# Patient Record
Sex: Male | Born: 1955
Health system: Southern US, Community
[De-identification: ages and names within clinical notes are randomized; demographics above are authoritative.]

## PROBLEM LIST (undated history)

## (undated) ENCOUNTER — Ambulatory Visit (HOSPITAL_COMMUNITY): Disposition: A | Discharge status: Other Acute Inpatient Hospital | Payer: 59

## (undated) DIAGNOSIS — Z973 Presence of spectacles and contact lenses: Secondary | ICD-10-CM

## (undated) DIAGNOSIS — E7800 Pure hypercholesterolemia, unspecified: Secondary | ICD-10-CM

## (undated) DIAGNOSIS — K5732 Diverticulitis of large intestine without perforation or abscess without bleeding: Secondary | ICD-10-CM

## (undated) DIAGNOSIS — L732 Hidradenitis suppurativa: Secondary | ICD-10-CM

## (undated) DIAGNOSIS — I4891 Unspecified atrial fibrillation: Secondary | ICD-10-CM

## (undated) DIAGNOSIS — E119 Type 2 diabetes mellitus without complications: Secondary | ICD-10-CM

## (undated) DIAGNOSIS — L723 Sebaceous cyst: Secondary | ICD-10-CM

## (undated) HISTORY — PX: ORCHIECTOMY: SHX2116

## (undated) HISTORY — DX: Type 2 diabetes mellitus without complications: E11.9

## (undated) HISTORY — PX: OTHER SURGICAL HISTORY: SHX169

---

## 1998-11-02 ENCOUNTER — Emergency Department (HOSPITAL_COMMUNITY): Admission: EM | Admit: 1998-11-02 | Discharge: 1998-11-02 | Payer: Self-pay

## 1998-12-01 ENCOUNTER — Emergency Department (HOSPITAL_COMMUNITY): Admission: EM | Admit: 1998-12-01 | Discharge: 1998-12-01 | Payer: Self-pay | Admitting: Emergency Medicine

## 1999-02-28 ENCOUNTER — Emergency Department (HOSPITAL_COMMUNITY): Admission: EM | Admit: 1999-02-28 | Discharge: 1999-02-28 | Payer: Self-pay | Admitting: Emergency Medicine

## 1999-05-23 ENCOUNTER — Emergency Department (HOSPITAL_COMMUNITY): Admission: EM | Admit: 1999-05-23 | Discharge: 1999-05-24 | Payer: Self-pay | Admitting: Internal Medicine

## 1999-07-17 ENCOUNTER — Emergency Department (HOSPITAL_COMMUNITY): Admission: EM | Admit: 1999-07-17 | Discharge: 1999-07-17 | Payer: Self-pay | Admitting: Emergency Medicine

## 2000-05-04 ENCOUNTER — Emergency Department (HOSPITAL_COMMUNITY): Admission: EM | Admit: 2000-05-04 | Discharge: 2000-05-04 | Payer: Self-pay | Admitting: *Deleted

## 2000-10-26 ENCOUNTER — Emergency Department (HOSPITAL_COMMUNITY): Admission: EM | Admit: 2000-10-26 | Discharge: 2000-10-27 | Payer: Self-pay | Admitting: Emergency Medicine

## 2001-06-12 ENCOUNTER — Emergency Department (HOSPITAL_COMMUNITY): Admission: EM | Admit: 2001-06-12 | Discharge: 2001-06-12 | Payer: Self-pay | Admitting: Emergency Medicine

## 2001-08-28 ENCOUNTER — Encounter (INDEPENDENT_AMBULATORY_CARE_PROVIDER_SITE_OTHER): Payer: Self-pay | Admitting: *Deleted

## 2001-08-28 ENCOUNTER — Encounter: Payer: Self-pay | Admitting: Emergency Medicine

## 2001-08-28 ENCOUNTER — Inpatient Hospital Stay (HOSPITAL_COMMUNITY): Admission: EM | Admit: 2001-08-28 | Discharge: 2001-09-06 | Payer: Self-pay | Admitting: Emergency Medicine

## 2001-09-02 ENCOUNTER — Encounter: Payer: Self-pay | Admitting: Urology

## 2002-07-14 ENCOUNTER — Emergency Department (HOSPITAL_COMMUNITY): Admission: EM | Admit: 2002-07-14 | Discharge: 2002-07-14 | Payer: Self-pay | Admitting: Emergency Medicine

## 2004-03-09 ENCOUNTER — Emergency Department (HOSPITAL_COMMUNITY): Admission: EM | Admit: 2004-03-09 | Discharge: 2004-03-09 | Payer: Self-pay | Admitting: Emergency Medicine

## 2006-08-15 ENCOUNTER — Emergency Department (HOSPITAL_COMMUNITY): Admission: EM | Admit: 2006-08-15 | Discharge: 2006-08-15 | Payer: Self-pay | Admitting: Family Medicine

## 2008-03-15 ENCOUNTER — Emergency Department (HOSPITAL_COMMUNITY): Admission: EM | Admit: 2008-03-15 | Discharge: 2008-03-15 | Payer: Self-pay | Admitting: Family Medicine

## 2008-03-17 ENCOUNTER — Inpatient Hospital Stay (HOSPITAL_COMMUNITY): Admission: EM | Admit: 2008-03-17 | Discharge: 2008-03-22 | Payer: Self-pay | Admitting: Emergency Medicine

## 2008-03-17 ENCOUNTER — Encounter (INDEPENDENT_AMBULATORY_CARE_PROVIDER_SITE_OTHER): Payer: Self-pay | Admitting: Orthopedic Surgery

## 2008-10-14 ENCOUNTER — Emergency Department (HOSPITAL_COMMUNITY): Admission: EM | Admit: 2008-10-14 | Discharge: 2008-10-14 | Payer: Self-pay | Admitting: Emergency Medicine

## 2010-11-29 NOTE — Op Note (Signed)
NAME:  Leroy Chen, Leroy Chen              ACCOUNT NO.:  [id number]   MEDICAL RECORD NO.:  [id number]          PATIENT TYPE:  INP   LOCATION:  5008                         FACILITY:  [facility]   PHYSICIAN:  Dionne Ano. Gramig III, M.D.DATE OF BIRTH:  1956/03/31   DATE OF PROCEDURE:  DATE OF DISCHARGE:                               OPERATIVE REPORT   PREOPERATIVE DIAGNOSIS:  Right hand deep abscess with purulent flexor  tenosynovitis.   POSTOPERATIVE DIAGNOSES:  Right hand deep abscess with purulent flexor  tenosynovitis with noted large inclusion cyst in the area of purulence  and notable flexor sheath infection.   SURGICAL PROCEDURE PERFORMED:  1. Incision and drainage deep abscess with evacuation of large amount      of purulence.  2. Irrigation and debridement of purulent flexor tendon sheath.  3. Removal of mass about the right hand, sent to pathology.  4. Ulnar digital nerve neurolysis with noted fraying and attenuation      of the epineurium.   SURGEON:  Dionne Ano. Amanda Pea, MD   ASSISTANT:  None.   COMPLICATIONS:  None.   DRAINS:  Two, one in the palm and one in the finger in the form of  iodoform gauze.   TOURNIQUET TIME:  Less than an hour.   INDICATIONS FOR THE PROCEDURE:  This patient is a pleasant male who has  had progressive infection in his hand, which did not respond to  conservative measures including doxycycline and visits to the Urgent  Care.  I have discussed with the patient the risks of surgery and should  note that he has a notable abscess, pain, purulent flexor tenosynovitic  symptoms and failure to respond.  With all issues in mind, he desired to  proceed the above-mentioned operative intervention fully understanding  and accepting the risks and benefits of the surgery.   OPERATION IN DETAIL:  The patient was administered anesthesia and taken  to the operative suite.  After smooth induction of general anesthesia,  time-out was called.  He was prepped and  draped in usual sterile fashion  and well secured.  Once under general anesthetic and sterile field, the  patient had incision made in the finger.  He had a large amount of de-  epithelialized tissue, which was removed.  This was performed without  difficulty.  Once this was removed, I then entered the area volarly  about the small finger, proximal phalanx region.  Dissection was carried  down and a large amount of thick purulent tissue was identified,  examined, and cultured, which was foul-smelling, thick abscess.  This  was removed without difficulty and following its removal and evacuation,  I then made a counterincision in the palm and debrided the skin and  subcutaneous tissue.  Once this was done, I then identified the flexor  sheath, which was encroached upon with the infection.  The sheath was  identified and I&D'ed rather aggressively and meticulously.  The sheath  had attenuation, but the flexor tendon apparatus was intact.  I  performed a through-and-through irrigation beginning in the palm and  exiting in the main wound.  Following this, the patient had a very  significant thick, glistening capsule, which looked like an inclusion  cyst.  This was circumferentially dissected and removed and sent for  pathology.  This was a deep mass excision.  Following this, I then  identified the ulnar digital nerve which I did multiple times during the  dissection and performed a neurolysis.  It had attenuation about the  epineurium region due to the infection and the mass itself that was  present, which was a likely inclusion-type cyst.  The patient had the  nerve very carefully protected.  It was not severed.  It was in  continuity but significantly bruised and injured in my opinion.  Following this, I then performed a very careful irrigation process with  greater than 3 L through the area.  The wound was then packed.  Tourniquet deflated.  Hemostasis was obtained with pressure and the   patient had no complicating features.  A sterile dressing was applied.  Excellent refill noted and once the dressing was applied, he was taken  to the recovery room where he was noted to be in stable condition.  We  will plan for elevation, pain management, antibiotics, await cultures,  and repeat washout in 36-48 hours.  I have discussed with him the  relevant do's and do not's, etc.  All questions have been encouraged and  answered.  I will give him a guarded prognosis.  He had significant  injuries to the ulnar digital nerve about the epineurium and thus I  would expect postop sensory disturbance.  This could even be permanent  given the look at the time of surgery.  In addition to this, we need to  work diligently on infection control.  I will start him on vancomycin  immediately after cultures and will also add [place].  It was a pleasure  to see him today.  We will work very hard to try to give him a  functioning hand despite a very significant infection.      Dionne Ano. Everlene Other, M.D.     Nash Mantis  D:  03/17/2008  T:  03/18/2008  Job:  [id number]

## 2010-11-29 NOTE — Op Note (Signed)
NAME:  Leroy Chen, Leroy Chen              ACCOUNT NO.:  [id number]   MEDICAL RECORD NO.:  [id number]          PATIENT TYPE:  INP   LOCATION:  5008                         FACILITY:  [facility]   PHYSICIAN:  Dionne Ano. Gramig, M.D.DATE OF BIRTH:  [birth year]   DATE OF PROCEDURE:  03/19/2008  DATE OF DISCHARGE:                               OPERATIVE REPORT   PREOPERATIVE DIAGNOSIS:  Right hand deep abscess with flexor  tenosynovitis, purulent in nature.   The patient is status post I and D, and presents for a repeat I and D  about the hand and small finger.   POSTOPERATIVE DIAGNOSIS:  Right hand deep abscess with flexor  tenosynovitis, purulent in nature.   PROCEDURE:  1. Incision and drainage, right hand skin, subcutaneous tissue,      tendon, and muscular tissue.  This was an excisional debridement.  2. Incision and drainage, small finger skin, subcutaneous tissue,      flexor tendon and posterior tendon sheath tissue.  This was an      excisional debridement.   SURGEON:  Dionne Ano. Amanda Pea, MD   ASSISTANT:  Karie Chimera, PA-C   COMPLICATIONS:  None.   ANESTHESIA:  General.   TOURNIQUET TIME:  Zero.   INDICATIONS FOR PROCEDURE:  This patient is a pleasant male, who has a  very deep significant abscess.  He had undergone an initial I and D, and  presents for repeat I and D and repair, reconstruction as necessary.  I  have counseled her with regard to benefits and risk of the surgery  including risk of infection, bleeding, anesthesia, damage to normal  structures, and failure of the surgery to accomplish its intended goals  of relieving symptoms and restoring function.  The patient understands  and desires to proceed.  All questions have been encouraged and answered  preoperatively.   OPERATIVE PROCEDURE:  The patient was seen by myself and anesthesia, he  was taken to the operative suite, where he underwent a smooth induction  of general anesthesia.  Following this, he was prepped  and draped in  sterile fashion with Betadine scrub and paint about the upper extremity.  Once this was complete, the patient then underwent I and D of skin,  subcutaneous tissue, muscle, tendon, and skin in the form of excisional  debridement about the hand.  This was a right hand I and D.  The wound  conditions were much improved.   Following this, he then underwent I and D of skin and subcutaneous  tissue, muscle, and tendinous tissue about the small finger in the form  of an excisional debridement.  The flexor tendon sheath looked well.  The flexors glided nicely, there were no significant reaccumulation,  although there was still significant erythema about the hand.  I looked  at the area very carefully and cautiously noted.  I did not notice any  obvious dystrophic reaction or vascular compromise.  The infection was,  of course, improved but certainly not a complete quiescence.   Following this, I placed a Penrose drain through-and-through and packed  the wound with a gauze followed  by sterile dressing.  The patient  tolerated this well and returned to recovery room.  I left a word for  the family and discussed with the patient all issues.   He will be continued on admit status for IV antibiotics.  We will begin  whirlpools tomorrow and wound care.  He is improving, but this was a  very significant abscess, deep infection and given the longstanding  duration of symptoms, I did feel that he has continued need for IV  antibiotics, need for intervention in the form of wound care, etc.  We  will await his final cultures, which are not back yet.   It has been a pleasure seeing him today.  All questions have been  encouraged and answered.      Dionne Ano. Amanda Pea, M.D.  Electronically Signed     WMG/MEDQ  D:  03/20/2008  T:  03/21/2008  Job:  [id number]

## 2010-12-02 NOTE — Discharge Summary (Signed)
[place]. [facility]  Patient:    CYPHER, PAULE Visit Number: [id number] MRN: [id number]          Service Type: MED Location: [phone] Attending Physician:  Ellwood Handler Dictated by:   [name], M.D. Admit Date:  08/28/2001 Discharge Date: 09/06/2001                             Discharge Summary  DATE OF BIRTH:  [birth year].  LMD:  Thora Lance, M.D.  UROLOGIST:  [name], M.D.  HISTORY OF PRESENT ILLNESS:  The indications, medications, allergies, tobacco, ETOH, past medical history, social history, physical examination and review of systems were all outlined in the admission note.  HOSPITAL COURSE:  The patient was admitted 08/28/01 undiagnosed diabetes, right testicular abscess refractory to outpatient oral antibiotic therapy.  The patient was ALLERGIC TO PENICILLIN.  Initial attempt at intravenous gentamicin and Unasyn changed to gentamicin and Cipro.  White blood cell count drawn shortly after admission 17.4.  Cultures were not obtained as the patient had been on p.o. Cipro for two days prior to admission.  He remained intermittently febrile on 08/28/01, 08/29/01, 08/30/01.  There were no clinical signs of Fourniers gangrene. Blood sugars were under improving control thanks to Dr. Kandyce Rud follow up.  By 09/01/01 the patient had no signs of sepsis or gangrene but pain and intermittent fevers persisted.  Lengthy discussion with the family 09/02/01 white blood cell count 17.8, hemoglobin 12.2, BUN 8, creatinine 1.0 felt we had made a good faith effort with intravenous antibiotics to resolve the situation.  The patients fever and pain continued and we proceeded with right scrotal orchiectomy 09/03/01.  Pathologic report on tissue submitted:  soft tissue and skin with acute and chronically inflamed granulation tissue consistent with abscess cavity, chronic epididymitis, and benign spermatocele.  No atypia was  identified.  By postoperative day #1 the patients temperature had fallen to 99 degrees F.  White blood cell count had fallen to 12,000.  The scrotal incision had been packed with Iodoform gauze which was changed on a b.i.d. basis.  The patient and family members were instructed on the technique of packing the wound.  By 09/06/01 temperature was 99 degrees F, white blood cell count had fallen to 12,000.  The patient was taking a regular diet with regular bowel habits, felt comfortable with p.o. Vicodin and p.o. Cipro.  PLAN:  Discharge home.  DISCHARGE DIAGNOSES: 1. Undiagnosed diabetes now under excellent control per Dr. Valentina Lucks. 2. Right testicular abscess, status post six days intravenous antibiotics    and then right orchiectomy.  DISCHARGE MEDICATIONS: 1. Cipro 2. Vicodin.  FOLLOW UP:  The patient has follow up with Dr. Valentina Lucks in two weeks and Dr. Wanda Plump in two weeks. Dictated by:   [name], M.D. Attending Physician:  Ellwood Handler DD:  09/06/01 TD:  09/07/01 Job: [id number]

## 2010-12-02 NOTE — H&P (Signed)
[place]. [facility]  Patient:    JESTEN, CAPPUCCIO Visit Number: [id number] MRN: [id number]          Service Type: MED Location: [phone] Attending Physician:  Ellwood Handler Dictated by:   [name], M.D. Proc. Date: 08/28/01 Admit Date:  08/28/2001   CC:         Thora Lance, M.D.   History and Physical  DATE OF BIRTH:  March 18, 1956  REFERRING PHYSICIAN:  Thora Lance, M.D.  HISTORY OF PRESENT ILLNESS:  The patient is a 55 year old black male with a strongly positive family history of diabetes, recent onset of right orchitis with suggestion by ultrasound of right testicular abscess.  These are the notable points of the recent history.  (1) Friday February 9 and Saturday February 10 the patient felt soreness in the right hemiscrotum, applied heat and maintained bed rest.  (2) Monday February 10 seen at the office by Dr. Tresa Endo, felt to have an epididymitis, treated with p.o. Cipro.  (3) Tuesday February 11 into the morning hours of Wednesday February 12, the patient noticed increasing pain, redness and onset of temperature 101 degrees F, presented to the emergency room.  MEDICATIONS ON ADMISSION:  The patient claims at present to not be taking any medications but had "tried a medicine for diabetes" several months ago.  ALLERGIES:  Denies.  TOBACCO:  Half a pack a day.  ETOH:  Occasional.  PAST MEDICAL HISTORY:  Excision of multiple sebaceous cysts from the posterior scalp on at least four or five occasions.  Pilonidal cyst x 2. Strongly positive family history of diabetes.  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  He is married for the second time.  41 year old wife.  Has two sons I assume from a previous marriage.  PHYSICAL EXAMINATION:  GENERAL:  The patient is an uncomfortable 55 year old black male.  VITAL SIGNS:  Temperature 101, pulse 131, respirations 20,  blood pressure 127/78.  HEENT:  Negative adenopathy, negative bruit.  Patient has multiple scars across the posterior scalp, consistent with excision of sebaceous cyst.  LUNGS:  The lungs were clear to auscultation and percussion.  HEART:  Regular rate and rhythm without murmur or gallop.  ABDOMEN:  Positive bowel sounds, soft, protuberant.  GU:  Bilaterally descended testes, normal penis and normal prostate.  Warm swollen, reddened right hemiscrotum.  No evidence of Fourniers gangrene at this time.  NEUROLOGIC:  Exam is grossly intact.  Ultrasound shows no evidence of torsion.  The patient has what appears to be a longstanding left-sided varicocele and a 3 cm abscess/phlegmon within the right testis.  PLAN:  Will admit for intravenous antibiotics.  Will follow clinical exam to rule out Fourniers gangrene or any undiagnosed diabetes. Dictated by:   [name], M.D. Attending Physician:  Ellwood Handler DD:  07/28/01 TD:  08/28/01 Job: [id number]

## 2010-12-02 NOTE — Consult Note (Signed)
[place]. [facility]  Patient:    Leroy, Chen Visit Number: [id number] MRN: [id number]          Service Type: MED Location: [phone] Attending Physician:  Cathren Laine Dictated by:   Thora Lance, M.D. Admit Date:  08/28/2001   CC:         [name], M.D.                          Consultation Report  HISTORY OF PRESENT ILLNESS:  This is a 55 year old black male admitted with a right testicular abscess.  Patient developed right testicular pain and swelling last week.  He was seen in our office on February 10 and diagnosed with epididymitis and started on ciprofloxacin.  Over the last 48 hours he has had progressive pain and swelling in the right testicle area and has developed fever.  An ultrasound today in the emergency room showed a right testicular abscess.  He had been admitted to the hospital by Dr. Wanda Plump of the urology service and I have been consulted for diabetic care.  The patient has a history of diabetes mellitus dating back to November 2000.  He was initially on Glucophage but that was stopped and he has been diet controlled since.  His hemoglobin A1C when last checked last spring was in the low 6 range.  He has not followed up since that time.  At home his blood sugars have been running in the high 100s.  He has had no known complications of his diabetes mellitus.  ALLERGIES:  PENICILLIN, unknown reaction as a child.  CURRENT MEDICATIONS:  Ciprofloxacin.  PAST MEDICAL HISTORY: 1. Diabetes mellitus type 2. 2. Pilonidal cyst. 3. Hydradenitis suppurativa of the scalp.  PAST SURGICAL HISTORY: 1. Pilonidal cyst removal April 1997. 2. Cyst drainage from the scalp 1990s. 3. Fracture left arm as a child.  FAMILY HISTORY:  Father died at age 67 with history of hypertension, heart disease, diabetes.  Mother died age 51 with history of CHF, diabetes, and hypertension.  Brother and sister are diabetic.  SOCIAL  HISTORY:  Divorced.  Two children.  He is Solicitor for [employer].  He smokes between one-third and one-half pack of cigarettes a day.  Drinks alcohol occasionally.  REVIEW OF SYSTEMS:  Otherwise negative.  PHYSICAL EXAMINATION  GENERAL:  He appears fairly comfortable lying supine.  VITAL SIGNS:  Temperature 101.0, heart rate 131, respirations 20, blood pressure 127/73.  HEENT:  Unremarkable.  LUNGS:  Clear.  HEART:  Regular rate and rhythm without murmur, rub, or gallop.  ABDOMEN:  Soft.  Normoactive bowel sounds.  No mass.  Nontender.  GENITOURINARY:  Warm, swollen right hemiscrotum with tenderness.  EXTREMITIES:  No edema.  LABORATORIES:  Sodium 133, potassium 3.6, chloride 103, bicarbonate 23, BUN 16, creatinine 1.1, glucose 176.  CBC:  WBC 14.7, hemoglobin 14.6, platelet count 314,000.  Urinalysis shows greater than 300 protein and greater than 500 glucose.  ASSESSMENT: 1. Right testicular abscess. 2. Diabetes mellitus, diet controlled.  RECOMMENDATION: 1. Management of testicular abscess with IV antibiotics and pain control as    per Dr. Wanda Plump. 2. Sliding scale Regular insulin. 3. Check hemoglobin A1C to assess recent control and possibly start oral agent    after that. Dictated by:   Thora Lance, M.D. Attending Physician:  Cathren Laine DD:  08/28/01 TD:  08/28/01 Job: [id number]

## 2010-12-02 NOTE — Discharge Summary (Signed)
NAME:  Leroy Chen, Leroy Chen              ACCOUNT NO.:  [id number]   MEDICAL RECORD NO.:  [id number]          PATIENT TYPE:  INP   LOCATION:  5008                         FACILITY:  [facility]   PHYSICIAN:  Dionne Ano. Gramig, M.D.DATE OF BIRTH:  February 25, 1956   DATE OF ADMISSION:  03/17/2008  DATE OF DISCHARGE:  03/22/2008                               DISCHARGE SUMMARY   ADMITTING DIAGNOSIS:  Deep abscess, right hand.   DISCHARGE DIAGNOSIS:  Deep abscess, right hand, improved.   SURGEON:  Dionne Ano. Gramig, MD   PROCEDURE:  Incision and drainage of deep abscess, incision and drainage  of flexor sheath and digital nerve neurolysis, and removal of mass about  the right hand.   BRIEF HISTORY OF PRESENT ILLNESS:  Leroy Chen is a 55 year old gentleman  who is left hand dominant and presented to the emergency room setting  for evaluation of his right hand.  He was noted to be seen and treated  for a cellulitic process/abscess initially at local urgent care and was  placed on doxycycline.  Unfortunately, he had continued pain, swelling,  and dysfunction and presented to the emergency room setting for takeover  care due to worsening clinical conditions.  Hand upper extremity  surgeon, Dominica Severin, was consulted.  On reviewing the patient, he  was noted to have what appear to be a puncture wound about the small  finger with associated soft tissue swelling, erythema, loss  flexion/extension secondary to pain, and ascending cellulitis.  Given  his examination, the decision was made to proceed with I&D and repair  reconstruction as necessary given his worsening condition.   Preoperative laboratory data was obtained, and EKG showed normal sinus  rhythm.  Hemogram showed white blood cell count of 10.8 and glucose was  116.  Decision was made to proceed with surgical intervention, and on  March 17, 2008, the patient underwent the above-mentioned procedure  without complications.  He was transferred to  PACU in stable condition.  He will be placed on orthopedic unit and was started on vancomycin and  Fortaz for antibiotic coverage until intraoperative cultures were  obtained for final results of sensitivity.  He was overall doing well.  Postoperative day #1, he was without complaints.  He felt better  overall.  Initial Gram-stain showed Gram-positive cocci.  He had  __________ about digits.   His heart was regular in rate and rhythm.  Lungs were clear to  auscultation.  Abdomen was soft, nontender, positive bowel sounds.  Given the significant infective process, plan for repeat washout was  made the following day.  He tolerated this well.  Of course, he was  noted to have significant amount of purulence about the abscess with  purulence into the flexor tendon sheath.  In addition, he underwent mass  removal about the right hand that was sent to Pathology which showed  benign epidermal inclusion cyst.  The patient did well postoperatively  with improvement overall of his wounds.  Therapeutic endeavors were  employed including hydrotherapy for wound care and packing.  He  continued his vancomycin and switched to Zosyn.  Final  cultures were  obtained.  They showed a moderate Staphylococcus aureus, a few Proteus  mirabilis.  Over the course of the next few days, the patient did quite  well, dressing changes were employed, as well as diligent wound care.  He had continued improvement in his wound and therapy was continually  consulted to teach the patient wet-to-dry dressing changes.  On  March 22, 2008, the patient was comfortable __________  well.  He was  alert and oriented.  Vital signs were stable.  He was afebrile.  His  wound showed significant improvement.  No significant purulence or  erythema was present, and range of motion was overall improved.  Therefore, decision was made to discharge him home in stable condition.   ASSESSMENT AND FINAL DIAGNOSIS:  Please see the discharge  diagnosis.   Condition on discharge is improved.   DIET:  Regular diet.   ACTIVITIES:  He will keep the hand clean and dry.  He will apply wet  daily dressing changes over the weekend and follow up with Dr. Amanda Pea on  Tuesday at 9:30 a.m.   Discharge medications included:  1. Percocet 5/325 one to two p.o. q.4-6 h. p.r.n.  2. Cipro 500 mg 1 p.o. b.i.d. for 10 days.  3. He will continue his doxycycline p.o. b.i.d. for 10 days.  4. Colace 100 mg p.o. b.i.d.  5. Vitamin C 500 mg 1 p.o. daily, over the counter.   All questions were encouraged and answered.      Karie Chimera, P.A.-C.      Dionne Ano. Amanda Pea, M.D.  Electronically Signed    BB/MEDQ  D:  05/27/2008  T:  05/27/2008  Job:  [id number]

## 2010-12-02 NOTE — Op Note (Signed)
[place]. [facility]  Patient:    Leroy Chen, Leroy Chen Visit Number: [id number] MRN: [id number]          Service Type: MED Location: [phone] Attending Physician:  Ellwood Handler Dictated by:   [name], M.D. Admit Date:  08/28/2001                             Operative Report  DATE OF BIRTH:  03-Sep-1955  LOCAL MEDICAL DOCTOR:  Kirby Funk, M.D.  UROLOGIST:  [name], M.D.  PREOPERATIVE DIAGNOSIS:  A 55 year old male diabetic, testis abscess, refractory to six days of intravenous antibiotic therapy.  Plans made for right scrotal orchiectomy.  POSTOPERATIVE DIAGNOSIS:  A 55 year old male diabetic, testis abscess, refractory to six days of intravenous antibiotic therapy.  Plans made for right scrotal orchiectomy.  PROCEDURE:  Right scrotal orchiectomy.  ANESTHESIA:  General.  DRAINS:  Iodoform gauze.  SPECIMENS:  Testis with attached spermatic cord.  DESCRIPTION OF PROCEDURE:  The patient was prepped and draped in the supine position after institution of an adequate level of general anesthesia.  Small transverse incision was made over the right external inguinal ring, carried down through the skin and subcutaneous tissue to identify the fibers of the external oblique as well as the external ring.  External oblique was divided in the direction of the fiber.  Cord was encircled and traced back to the internal ring.  It was then divided with Kelly clamps.  The proximal end of the cord showed no evidence of erythema or obvious clinical evidence of infection.  It was doubly ligated with 0 silk free ties.  The cord was then retracted medially.  Fascia of the external oblique was closed with interrupted sutures of 0 Vicryl.  A longitudinal incision was then made in the scrotum taking care to excise a small area of erythematous skin along the dependent portion of the scrotum that had been draining purulent  debris overnight.  Dissection with the Bovie was carried down through the skin and subcutaneous tissues to reveal abscess cavity both within the testis and adjacent to the testis.  Sharp and blunt dissection was used to free the testis and spermatic cord from its surrounding fibrous attachments.  Cord with attached testis was then sent free as a specimen.  Careful dissection freed the abscess cavity from the medial edge of the scrotal wall.  Care was taken to not injure the left testis or penile shaft.  Bleeding sites were then patiently coagulated using the Bovie.  Transverse incision that had been made over the external ring was partially closed with skin staples.  Then 3-0 nylon was used to close the upper part of the scrotal incision, about three mattress sutures were loosely placed in the skin.  Wound was the irrigated with about 2 L of warm normal saline.  The entire wound was then packed with 2-inch iodoform gauze and covered with an athletic supporter.  The patient was returned to recovery room in satisfactory condition. Dictated by:   [name], M.D. Attending Physician:  Ellwood Handler DD:  09/03/01 TD:  09/03/01 Job: [id number]

## 2011-04-19 LAB — BASIC METABOLIC PANEL
BUN: 8
CO2: 27
Calcium: 9.1
Chloride: 104
Chloride: 107
Creatinine, Ser: 0.9
GFR calc Af Amer: >60
GFR calc non Af Amer: >60
GFR calc non Af Amer: >60
Glucose, Bld: 116 — ABNORMAL HIGH
Glucose, Bld: 117 — ABNORMAL HIGH
Potassium: 3.7
Potassium: 3.9
Sodium: 139
Sodium: 140

## 2011-04-19 LAB — CBC
Hemoglobin: 13
MCHC: 32.9
MCV: 94.3
RBC: 4.2 — ABNORMAL LOW
WBC: 10.8 — ABNORMAL HIGH

## 2011-04-19 LAB — ANAEROBIC CULTURE

## 2011-04-19 LAB — VANCOMYCIN, TROUGH: Vancomycin Tr: 9.1

## 2011-04-19 LAB — CULTURE, ROUTINE-ABSCESS

## 2012-08-01 ENCOUNTER — Encounter (INDEPENDENT_AMBULATORY_CARE_PROVIDER_SITE_OTHER): Payer: Self-pay

## 2012-08-02 ENCOUNTER — Encounter (INDEPENDENT_AMBULATORY_CARE_PROVIDER_SITE_OTHER): Payer: Self-pay | Admitting: Surgery

## 2012-08-02 ENCOUNTER — Ambulatory Visit (INDEPENDENT_AMBULATORY_CARE_PROVIDER_SITE_OTHER): Payer: 59 | Admitting: Surgery

## 2012-08-02 VITALS — BP 110/68 | HR 84 | Temp 98.3°F | Resp 18 | Ht 73.0 in | Wt 257.6 lb

## 2012-08-02 DIAGNOSIS — L732 Hidradenitis suppurativa: Secondary | ICD-10-CM | POA: Insufficient documentation

## 2012-08-02 NOTE — Progress Notes (Signed)
Patient ID: Leroy Chen, male   DOB: 1955/09/11, 57 y.o.   MRN: [id number]  Chief Complaint  Patient presents with  . New Evaluation    eval abcess scalp    HPI Leroy Chen is a 57 y.o. male.  Patient sent at the request of Dr. Andi Devon for abscess on right scalp. Patient has been antibiotics for one week. Denies any significant pain, drainage and notices a lump just over his right ear.  History of  Hidradenitis involving posterior scalp status post wide excision with tissue expander and reconstruction by plastic surgeon. Denies fever chills. HPI  Past Medical History  Diagnosis Date  . Diabetes mellitus without complication     History reviewed. No pertinent past surgical history.  History reviewed. No pertinent family history.  Social History History  Substance Use Topics  . Smoking status: Current Every Day Smoker    Types: Cigarettes  . Smokeless tobacco: Never Used  . Alcohol Use: Yes     Comment: Social    Allergies  Allergen Reactions  . Penicillins Swelling    Current Outpatient Prescriptions  Medication Sig Dispense Refill  . aspirin 81 MG tablet Take 81 mg by mouth daily.      Marland Kitchen CIALIS 20 MG tablet       . doxycycline (DORYX) 100 MG DR capsule Take 100 mg by mouth 2 (two) times daily.      Marland Kitchen glimepiride (AMARYL) 4 MG tablet       . lisinopril (PRINIVIL,ZESTRIL) 20 MG tablet Take 20 mg by mouth daily.      . metFORMIN (GLUCOPHAGE) 500 MG tablet Take 500 mg by mouth 2 (two) times daily with a meal.      . Multiple Vitamin (MULTIVITAMIN) tablet Take 1 tablet by mouth daily.      . mupirocin ointment (BACTROBAN) 2 % Apply topically 3 (three) times daily.      Marland Kitchen PHISOHEX 3 % liquid       . rifampin (RIFADIN) 300 MG capsule Take 300 mg by mouth.      . sulfamethoxazole-trimethoprim (BACTRIM DS) 800-160 MG per tablet Take 1 tablet by mouth 2 (two) times daily.      . TESTIM 50 MG/5GM GEL         Review of Systems Review of Systems  Constitutional:  Negative for fever, chills and unexpected weight change.  HENT: Negative for hearing loss, congestion, sore throat, trouble swallowing and voice change.   Eyes: Negative for visual disturbance.  Respiratory: Negative for cough and wheezing.   Cardiovascular: Negative for chest pain, palpitations and leg swelling.  Gastrointestinal: Negative for nausea, vomiting, abdominal pain, diarrhea, constipation, blood in stool, abdominal distention, anal bleeding and rectal pain.  Genitourinary: Negative for hematuria and difficulty urinating.  Musculoskeletal: Negative for arthralgias.  Skin: Negative for rash and wound.  Neurological: Negative for seizures, syncope, weakness and headaches.  Hematological: Negative for adenopathy. Does not bruise/bleed easily.  Psychiatric/Behavioral: Negative for confusion.    Blood pressure 110/68, pulse 84, temperature 98.3 F (36.8 C), temperature source Temporal, resp. rate 18, height 6\' 1"  (1.854 m), weight 257 lb 9.6 oz (116.847 kg).  Physical Exam Physical Exam  Constitutional: He is oriented to person, place, and time. He appears well-developed and well-nourished.  HENT:  Head:    Eyes: EOM are normal. Pupils are equal, round, and reactive to light.  Neck: Normal range of motion. Neck supple.  Musculoskeletal: Normal range of motion.  Neurological: He is alert and  oriented to person, place, and time.  Psychiatric: He has a normal mood and affect. His behavior is normal. Judgment and thought content normal.       Assessment    Hidradenitis scalp without active infection    Plan    Patient has no active area infection currently. I discussed excision of this area but this would require plastic surgeon with extensive reconstruction and he has done it already and has no interest in pursuing that. At this point in time he is no acute issue to deal with and will followup as needed. Most of the  infection is resolved antibiotics.        Jaimarie Rapozo A. 08/02/2012, 9:47 AM

## 2012-08-02 NOTE — Patient Instructions (Signed)
Hidradenitis Suppurativa, Sweat Gland Abscess  Hidradenitis suppurativa is a long lasting (chronic), uncommon disease of the sweat glands. With this, boil-like lumps and scarring develop in the groin, some times under the arms (axillae), and under the breasts. It may also uncommonly occur behind the ears, in the crease of the buttocks, and around the genitals.   CAUSES   The cause is from a blocking of the sweat glands. They then become infected. It may cause drainage and odor. It is not contagious. So it cannot be given to someone else. It most often shows up in puberty (about 10 to 56 years of age). But it may happen much later. It is similar to acne which is a disease of the sweat glands. This condition is slightly more common in African-Americans and women.  SYMPTOMS    Hidradenitis usually starts as one or more red, tender, swellings in the groin or under the arms (axilla).   Over a period of hours to days the lesions get larger. They often open to the skin surface, draining clear to yellow-colored fluid.   The infected area heals with scarring.  DIAGNOSIS   Your caregiver makes this diagnosis by looking at you. Sometimes cultures (growing germs on plates in the lab) may be taken. This is to see what germ (bacterium) is causing the infection.   TREATMENT    Topical germ killing medicine applied to the skin (antibiotics) are the treatment of choice. Antibiotics taken by mouth (systemic) are sometimes needed when the condition is getting worse or is severe.   Avoid tight-fitting clothing which traps moisture in.   Dirt does not cause hidradenitis and it is not caused by poor hygiene.   Involved areas should be cleaned daily using an antibacterial soap. Some patients find that the liquid form of Lever 2000, applied to the involved areas as a lotion after bathing, can help reduce the odor related to this condition.   Sometimes surgery is needed to drain infected areas or remove scarred tissue. Removal of  large amounts of tissue is used only in severe cases.   Birth control pills may be helpful.   Oral retinoids (vitamin A derivatives) for 6 to 12 months which are effective for acne may also help this condition.   Weight loss will improve but not cure hidradenitis. It is made worse by being overweight. But the condition is not caused by being overweight.   This condition is more common in people who have had acne.   It may become worse under stress.  There is no medical cure for hidradenitis. It can be controlled, but not cured. The condition usually continues for years with periods of getting worse and getting better (remission).  Document Released: 02/15/2004 Document Revised: 09/25/2011 Document Reviewed: 03/02/2008  [employer] Patient Information 2013 [employer].

## 2012-08-03 ENCOUNTER — Emergency Department (HOSPITAL_COMMUNITY)
Admission: EM | Admit: 2012-08-03 | Discharge: 2012-08-03 | Disposition: A | Discharge status: Home / Self Care | Payer: 59 | Source: Home / Self Care | Attending: Emergency Medicine | Admitting: Emergency Medicine

## 2012-08-03 ENCOUNTER — Encounter (HOSPITAL_COMMUNITY): Payer: Self-pay | Admitting: Emergency Medicine

## 2012-08-03 DIAGNOSIS — L723 Sebaceous cyst: Secondary | ICD-10-CM

## 2012-08-03 MED ORDER — CLINDAMYCIN HCL 300 MG PO CAPS: 300.0000 mg | ORAL_CAPSULE | Freq: Four times a day (QID) | ORAL | Status: DC

## 2012-08-03 NOTE — ED Provider Notes (Signed)
Chief Complaint  Patient presents with  . Abscess    on right side of face above ear x 1 month    History of Present Illness:    Leroy Chen is a 57 year old male who has a 3 week history of a boil on his right temple. He's been to [facility] urgent care twice and then placed on various antibiotics including Septra and doxycycline without improvement. This has not been incised or drained. It's not draining spontaneously and he's had no fever or chills. He does have a history of recurring abscesses throughout his life and has a history of dissecting cellulitis of the scalp. He also has a history of hidradenitis suppurativa and MRSA.  Review of Systems:  Other than noted above, the patient denies any of the following symptoms: Systemic:  No fever, chills or sweats. Skin:  No rash or itching.  PMFSH:  Past medical history, family history, social history, meds, and allergies were reviewed.  No history of diabetes or prior history of abscesses or MRSA.  Physical Exam:   Vital signs:  BP 147/86  Pulse 87  Temp 98.2 F (36.8 C) (Oral)  Resp 17  SpO2 100% Skin:  There is a 3 cm raised, red, fluctuant mass in the right temple just at the hairline which was tender to palpation.  Skin exam was otherwise normal, except for extensive scarring of the scalp secondary to previous history of dissecting cellulitis of the scalp.  No rash. Ext:  Distal pulses were full, patient has full ROM of all joints.  Procedure:  Verbal informed consent was obtained.  The patient was informed of the risks and benefits of the procedure and understands and accepts.  Identity of the patient was verified verbally and by wristband.   The abscess area described above was prepped with Betadine and alcohol and anesthetized with 3 mL of 2% Xylocaine with epinephrine.  Using a #11 scalpel blade, a singe straight incision was made into the area of fluctulence, yielding a large amount of prurulent drainage.  Routine cultures  were obtained.  Blunt dissection was used to break up loculations, and a cystic structure was found which was removed in its entirety. The resulting wound cavity was packed with 1/4 inch Iodoform gauze.  A sterile pressure dressing was applied.  Assessment:  The encounter diagnosis was Sebaceous cyst.  Plan:   1.  The following meds were prescribed:   New Prescriptions   CLINDAMYCIN (CLEOCIN) 300 MG CAPSULE    Take 1 capsule (300 mg total) by mouth 4 (four) times daily.   2.  The patient was instructed in symptomatic care and handouts were given. 3.  The patient was instructed to leave the dressing in place and return again in 48 hours for packing removal.  [name], MD 08/03/12 [phone]

## 2012-08-03 NOTE — ED Notes (Signed)
Pt c/o abscess on right side of face above ear x 1 month. Was seen at [facility] and is currently taking meds but it seems to be getting worse instead of better, per pt.

## 2012-08-03 NOTE — ED Notes (Signed)
Pt state that he has a history of skin infections.

## 2012-08-06 LAB — CULTURE, ROUTINE-ABSCESS: Culture: NO GROWTH

## 2012-10-28 ENCOUNTER — Emergency Department (HOSPITAL_COMMUNITY)
Admission: EM | Admit: 2012-10-28 | Discharge: 2012-10-28 | Disposition: A | Discharge status: Home / Self Care | Payer: 59 | Source: Home / Self Care

## 2012-10-28 ENCOUNTER — Encounter (HOSPITAL_COMMUNITY): Payer: Self-pay | Admitting: Emergency Medicine

## 2012-10-28 DIAGNOSIS — M7918 Myalgia, other site: Secondary | ICD-10-CM

## 2012-10-28 DIAGNOSIS — M79609 Pain in unspecified limb: Secondary | ICD-10-CM

## 2012-10-28 DIAGNOSIS — IMO0001 Reserved for inherently not codable concepts without codable children: Secondary | ICD-10-CM

## 2012-10-28 DIAGNOSIS — M791 Myalgia, unspecified site: Secondary | ICD-10-CM

## 2012-10-28 DIAGNOSIS — R209 Unspecified disturbances of skin sensation: Secondary | ICD-10-CM

## 2012-10-28 NOTE — ED Provider Notes (Signed)
History     CSN: [id number]  Arrival date & time 10/28/12  1150   First MD Initiated Contact with Patient 10/28/12 1256      Chief Complaint  Patient presents with  . Arm Pain    (Consider location/radiation/quality/duration/timing/severity/associated sxs/prior treatment) HPI Comments: 57 year old male is complaining of numbness in the right upper extremity associated with pain in the right upper arm and trapezius muscle. The symptoms began approximately 2 weeks ago. He went to the [facility] urgent center where they took x-rays of the neck and shoulder. He was told these were negative for anything that may calls numbness and pain to the shoulder. He spends a lot of time on the computer. The muscles of the shoulder and arm  are primarily in contracted using on the keyboard. No history of trauma.   Past Medical History  Diagnosis Date  . Diabetes mellitus without complication     Past Surgical History  Procedure Laterality Date  . Hand sugery      No family history on file.  History  Substance Use Topics  . Smoking status: Current Every Day Smoker    Types: Cigarettes  . Smokeless tobacco: Never Used  . Alcohol Use: Yes     Comment: Social      Review of Systems  Constitutional: Negative.   Respiratory: Negative.   Gastrointestinal: Negative.   Genitourinary: Negative.   Musculoskeletal: Positive for myalgias. Negative for arthralgias.       As per HPI  Skin: Negative.   Neurological: Negative for dizziness, weakness and headaches.    Allergies  Penicillins  Home Medications   Current Outpatient Rx  Name  Route  Sig  Dispense  Refill  . aspirin 81 MG tablet   Oral   Take 81 mg by mouth daily.         Marland Kitchen CIALIS 20 MG tablet               . clindamycin (CLEOCIN) 300 MG capsule   Oral   Take 1 capsule (300 mg total) by mouth 4 (four) times daily.   40 capsule   0   . doxycycline (DORYX) 100 MG DR capsule   Oral   Take 100 mg by mouth 2 (two)  times daily.         Marland Kitchen glimepiride (AMARYL) 4 MG tablet               . lisinopril (PRINIVIL,ZESTRIL) 20 MG tablet   Oral   Take 20 mg by mouth daily.         . metFORMIN (GLUCOPHAGE) 500 MG tablet   Oral   Take 500 mg by mouth 2 (two) times daily with a meal.         . Multiple Vitamin (MULTIVITAMIN) tablet   Oral   Take 1 tablet by mouth daily.         . mupirocin ointment (BACTROBAN) 2 %   Topical   Apply topically 3 (three) times daily.         Marland Kitchen PHISOHEX 3 % liquid               . rifampin (RIFADIN) 300 MG capsule   Oral   Take 300 mg by mouth.         . sulfamethoxazole-trimethoprim (BACTRIM DS) 800-160 MG per tablet   Oral   Take 1 tablet by mouth 2 (two) times daily.         . TESTIM 50 MG/5GM  GEL                 BP 133/53  Pulse 86  Temp(Src) 98.3 F (36.8 C) (Oral)  Resp 28  SpO2 97%  Physical Exam  Nursing note and vitals reviewed. Constitutional: He is oriented to person, place, and time. He appears well-developed and well-nourished. No distress.  Eyes: EOM are normal.  Neck: Normal range of motion. Neck supple.  Pulmonary/Chest: Effort normal. No respiratory distress.  Musculoskeletal:  And this in the right trapezius musculature as well as the deltoid and part of the triceps. Primary involving the muscles of abduction. Is able to abduct the arm 180 produces moderate pain in the trapezius. Strength in the right upper extremities 5 over 5 distal neurovascular and motor sensory is completely intact. Positive Phalen's.  Neurological: He is alert and oriented to person, place, and time. He exhibits normal muscle tone.  Skin: Skin is warm and dry.  Psychiatric: He has a normal mood and affect.    ED Course  Procedures (including critical care time)  Labs Reviewed - No data to display No results found.   1. Paresthesia and pain of right extremity   2. Myalgia   3. Myofascial pain on right side       MDM  Limit use  of shoulder and neck muscles in regards to activities that make it worse. Relax these muscle as demonstrated. Heat locally, Ibuprofen on occasion. Massage, deep will help.  Adjust the arm and shoulder positions to minimize pain while using the keyboard. , Told to keep appointment with PCP or if worse may return        Hayden Rasmussen, NP 10/28/12 1335

## 2012-10-28 NOTE — ED Provider Notes (Signed)
Medical screening examination/treatment/procedure(s) were performed by non-physician practitioner and as supervising physician I was immediately available for consultation/collaboration.  Laura-Lee Villegas   Kenidee Cregan, MD 10/28/12 1358 

## 2012-10-28 NOTE — ED Notes (Signed)
Right arm tingling/numbness.  Reports 2 week history of issues with right arm.  Patient is left handed.  No known injury.  Patient reports pain as odd feeling-numbness/tingling .  Discomfort is varied in degree sensation is felt and when felt.

## 2012-11-25 ENCOUNTER — Encounter (HOSPITAL_COMMUNITY): Payer: Self-pay

## 2012-11-25 ENCOUNTER — Emergency Department (HOSPITAL_COMMUNITY)
Admission: EM | Admit: 2012-11-25 | Discharge: 2012-11-25 | Disposition: A | Discharge status: Home / Self Care | Payer: 59 | Attending: Emergency Medicine | Admitting: Emergency Medicine

## 2012-11-25 ENCOUNTER — Emergency Department (HOSPITAL_COMMUNITY): Payer: 59

## 2012-11-25 DIAGNOSIS — Z880 Allergy status to penicillin: Secondary | ICD-10-CM | POA: Insufficient documentation

## 2012-11-25 DIAGNOSIS — R0609 Other forms of dyspnea: Secondary | ICD-10-CM | POA: Insufficient documentation

## 2012-11-25 DIAGNOSIS — F17200 Nicotine dependence, unspecified, uncomplicated: Secondary | ICD-10-CM | POA: Insufficient documentation

## 2012-11-25 DIAGNOSIS — Z79899 Other long term (current) drug therapy: Secondary | ICD-10-CM | POA: Insufficient documentation

## 2012-11-25 DIAGNOSIS — R0989 Other specified symptoms and signs involving the circulatory and respiratory systems: Secondary | ICD-10-CM | POA: Insufficient documentation

## 2012-11-25 DIAGNOSIS — R079 Chest pain, unspecified: Secondary | ICD-10-CM | POA: Insufficient documentation

## 2012-11-25 DIAGNOSIS — Z8639 Personal history of other endocrine, nutritional and metabolic disease: Secondary | ICD-10-CM | POA: Insufficient documentation

## 2012-11-25 DIAGNOSIS — Z862 Personal history of diseases of the blood and blood-forming organs and certain disorders involving the immune mechanism: Secondary | ICD-10-CM | POA: Insufficient documentation

## 2012-11-25 DIAGNOSIS — Z7982 Long term (current) use of aspirin: Secondary | ICD-10-CM | POA: Insufficient documentation

## 2012-11-25 DIAGNOSIS — E119 Type 2 diabetes mellitus without complications: Secondary | ICD-10-CM | POA: Insufficient documentation

## 2012-11-25 HISTORY — DX: Pure hypercholesterolemia, unspecified: E78.00

## 2012-11-25 LAB — POCT I-STAT TROPONIN I: Troponin i, poc: 0 ng/mL (ref 0.00–0.08)

## 2012-11-25 LAB — POCT I-STAT, CHEM 8
HCT: 50 % (ref 39.0–52.0)
Hemoglobin: 17 g/dL (ref 13.0–17.0)
Sodium: 143 mEq/L (ref 135–145)
TCO2: 24 mmol/L (ref 0–100)

## 2012-11-25 LAB — CBC
MCH: 31.2 pg (ref 26.0–34.0)
MCHC: 33.7 g/dL (ref 30.0–36.0)
Platelets: 224 10*3/uL (ref 150–400)

## 2012-11-25 MED ORDER — GI COCKTAIL ~~LOC~~
30.0000 mL | Freq: Once | ORAL | Status: AC
  Administered 2012-11-25: 30 mL via ORAL
  Filled 2012-11-25: qty 30

## 2012-11-25 MED ORDER — FAMOTIDINE 20 MG PO TABS: 20.0000 mg | ORAL_TABLET | Freq: Two times a day (BID) | ORAL | Status: DC

## 2012-11-25 MED ORDER — ASPIRIN 81 MG PO CHEW
324.0000 mg | CHEWABLE_TABLET | Freq: Once | ORAL | Status: AC
  Administered 2012-11-25: 324 mg via ORAL
  Filled 2012-11-25: qty 4

## 2012-11-25 NOTE — ED Notes (Signed)
Pt complains of chest pain onset 3-4 days ago with hx of indegestion also. Reports diaphoresis at tnight and sob.

## 2012-11-25 NOTE — ED Provider Notes (Signed)
History     CSN: [id number]  Arrival date & time 11/25/12  0510   First MD Initiated Contact with Patient 11/25/12 0532      Chief Complaint  Patient presents with  . Chest Pain    (Consider location/radiation/quality/duration/timing/severity/associated sxs/prior treatment) HPI Hx per PT. Substernal chest pressure last few days constant unchanged. No known aggrevating or alleviating factors, has been trying OTC antiacid medications without relief. No SOB with these symptoms although reports some intermittent dyspnea at times. No cough, no F/C, no heartburn. No trauma. No rash. No recent illness. No known h/o CAD, has never had a stress test.  Pain is MOD in severity and not radiating Past Medical History  Diagnosis Date  . Diabetes mellitus without complication   . High cholesterol     Past Surgical History  Procedure Laterality Date  . Hand sugery      History reviewed. No pertinent family history.  History  Substance Use Topics  . Smoking status: Current Every Day Smoker    Types: Cigarettes  . Smokeless tobacco: Never Used  . Alcohol Use: Yes     Comment: Social      Review of Systems  Constitutional: Negative for fever and chills.  HENT: Negative for neck pain and neck stiffness.   Eyes: Negative for pain.  Respiratory: Negative for cough.   Cardiovascular: Positive for chest pain.  Gastrointestinal: Negative for abdominal pain.  Genitourinary: Negative for dysuria.  Musculoskeletal: Negative for back pain.  Skin: Negative for rash.  Neurological: Negative for headaches.  All other systems reviewed and are negative.    Allergies  Penicillins  Home Medications   Current Outpatient Rx  Name  Route  Sig  Dispense  Refill  . aspirin 81 MG tablet   Oral   Take 81 mg by mouth daily.         Marland Kitchen CIALIS 20 MG tablet               . clindamycin (CLEOCIN) 300 MG capsule   Oral   Take 1 capsule (300 mg total) by mouth 4 (four) times daily.   40  capsule   0   . doxycycline (DORYX) 100 MG DR capsule   Oral   Take 100 mg by mouth 2 (two) times daily.         Marland Kitchen glimepiride (AMARYL) 4 MG tablet               . lisinopril (PRINIVIL,ZESTRIL) 20 MG tablet   Oral   Take 20 mg by mouth daily.         . metFORMIN (GLUCOPHAGE) 500 MG tablet   Oral   Take 500 mg by mouth 2 (two) times daily with a meal.         . Multiple Vitamin (MULTIVITAMIN) tablet   Oral   Take 1 tablet by mouth daily.         . mupirocin ointment (BACTROBAN) 2 %   Topical   Apply topically 3 (three) times daily.         Marland Kitchen PHISOHEX 3 % liquid               . rifampin (RIFADIN) 300 MG capsule   Oral   Take 300 mg by mouth.         . sulfamethoxazole-trimethoprim (BACTRIM DS) 800-160 MG per tablet   Oral   Take 1 tablet by mouth 2 (two) times daily.         Marland Kitchen  TESTIM 50 MG/5GM GEL                 BP 137/74  Pulse 80  Temp(Src) 98.3 F (36.8 C) (Oral)  Resp 18  SpO2 98%  Physical Exam  Constitutional: He is oriented to person, place, and time. He appears well-developed and well-nourished.  HENT:  Head: Normocephalic and atraumatic.  Eyes: Conjunctivae and EOM are normal. Pupils are equal, round, and reactive to light.  Neck: Trachea normal. Neck supple. No thyromegaly present.  Cardiovascular: Normal rate, regular rhythm, S1 normal, S2 normal and normal pulses.     No systolic murmur is present   No diastolic murmur is present  Pulses:      Radial pulses are 2+ on the right side, and 2+ on the left side.  Pulmonary/Chest: Effort normal and breath sounds normal. He has no wheezes. He has no rhonchi. He has no rales. He exhibits no tenderness.  Abdominal: Soft. Normal appearance and bowel sounds are normal. There is no tenderness. There is no CVA tenderness and negative Murphy's sign.  Musculoskeletal:  calves nontender, no cords or erythema, negative Homans sign  Neurological: He is alert and oriented to person,  place, and time. He has normal strength. No cranial nerve deficit or sensory deficit. GCS eye subscore is 4. GCS verbal subscore is 5. GCS motor subscore is 6.  Skin: Skin is warm and dry. No rash noted. He is not diaphoretic.  Psychiatric: His speech is normal.  Cooperative and appropriate    ED Course  Procedures (including critical care time)  Results for orders placed during the hospital encounter of 11/25/12  CBC      Result Value Range   WBC 9.2  4.0 - 10.5 K/uL   RBC 4.94  4.22 - 5.81 MIL/uL   Hemoglobin 15.4  13.0 - 17.0 g/dL   HCT 16.1  09.6 - 04.5 %   MCV 92.5  78.0 - 100.0 fL   MCH 31.2  26.0 - 34.0 pg   MCHC 33.7  30.0 - 36.0 g/dL   RDW 40.9  81.1 - 91.4 %   Platelets 224  150 - 400 K/uL  POCT I-STAT, CHEM 8      Result Value Range   Sodium 143  135 - 145 mEq/L   Potassium 3.6  3.5 - 5.1 mEq/L   Chloride 107  96 - 112 mEq/L   BUN 16  6 - 23 mg/dL   Creatinine, Ser 7.82  0.50 - 1.35 mg/dL   Glucose, Bld 956 (*) 70 - 99 mg/dL   Calcium, Ion 2.13  0.86 - 1.23 mmol/L   TCO2 24  0 - 100 mmol/L   Hemoglobin 17.0  13.0 - 17.0 g/dL   HCT 57.8  46.9 - 62.9 %  POCT I-STAT TROPONIN I      Result Value Range   Troponin i, poc 0.00  0.00 - 0.08 ng/mL   Comment 3            Dg Chest Portable 1 View  11/25/2012  *RADIOLOGY REPORT*  Clinical Data: Mid chest pain  PORTABLE CHEST - 1 VIEW  Comparison: 03/17/2008  Findings: Lungs are predominately clear. No pleural effusion or pneumothorax. The cardiomediastinal contours are within normal limits. The visualized bones and soft tissues are without significant appreciable abnormality.  IMPRESSION: No radiographic evidence of acute cardiopulmonary process.   Original Report Authenticated By: Jearld Lesch, M.D.        Date: 11/25/2012  Rate: 79  Rhythm: normal sinus rhythm  QRS Axis: left  Intervals: normal  ST/T Wave abnormalities: nonspecific ST changes  Conduction Disutrbances:none  Narrative Interpretation:   Old EKG  Reviewed: none available  7:32 AM recheck pain improved with GI cocktail. Plan d/c home and outpatient stress testing.  Cont ASA. Strict return precautions verbalized as understood. RX pepcid provided   MDM  CP low risk for ACS  ECG. CXR. Labs  VS and nursing notes reviewed  Single troponin for multiple days of continuous symptoms.       Sunnie Nielsen, MD 11/25/12 [phone]

## 2013-03-26 ENCOUNTER — Emergency Department (HOSPITAL_COMMUNITY)
Admission: EM | Admit: 2013-03-26 | Discharge: 2013-03-26 | Disposition: A | Discharge status: Home / Self Care | Payer: 59 | Source: Home / Self Care

## 2013-03-26 ENCOUNTER — Encounter (HOSPITAL_COMMUNITY): Payer: Self-pay | Admitting: Emergency Medicine

## 2013-03-26 DIAGNOSIS — L0291 Cutaneous abscess, unspecified: Secondary | ICD-10-CM

## 2013-03-26 MED ORDER — SULFAMETHOXAZOLE-TRIMETHOPRIM 800-160 MG PO TABS: 1.0000 | ORAL_TABLET | Freq: Two times a day (BID) | ORAL | Status: DC

## 2013-03-26 MED ORDER — HYDROCODONE-ACETAMINOPHEN 7.5-325 MG PO TABS: 1.0000 | ORAL_TABLET | ORAL | Status: DC | PRN

## 2013-03-26 NOTE — ED Notes (Signed)
C/o abscess located under arm, groin, buttocks, and by ear.   Patient has been on antibiotics by primary which was prescribed 8/29.  Patient states he has finished all of the medication

## 2013-03-26 NOTE — ED Provider Notes (Signed)
CSN: [id number]     Arrival date & time 03/26/13  1312 History   First MD Initiated Contact with Patient 03/26/13 1416     Chief Complaint  Patient presents with  . Abscess   (Consider location/radiation/quality/duration/timing/severity/associated sxs/prior Treatment) HPI Comments: 57 year old male presents with a history of "boils". His been diagnosed with MRSA in the remote past. 2 weeks ago he developed abscess lesions in the right axilla, the left buttock and left inguinal. He states the abscess formation in the left inguinal  is draining. He contacted the doctor's office about 2 weeks ago and was placed on an antibiotic but he does not know the name of it. He states that it apparently has not been helping. He states the lesions are quite tender especially the one in the right axilla.   Past Medical History  Diagnosis Date  . Diabetes mellitus without complication   . High cholesterol    Past Surgical History  Procedure Laterality Date  . Hand sugery     History reviewed. No pertinent family history. History  Substance Use Topics  . Smoking status: Current Every Day Smoker    Types: Cigarettes  . Smokeless tobacco: Never Used  . Alcohol Use: Yes     Comment: Social    Review of Systems  Constitutional: Positive for activity change. Negative for fever and fatigue.  HENT: Negative.   Respiratory: Negative.   Gastrointestinal: Negative.   Genitourinary: Negative.   Skin: Negative for rash.       As per history of present illness  Neurological: Negative.     Allergies  Penicillins  Home Medications   Current Outpatient Rx  Name  Route  Sig  Dispense  Refill  . aspirin 81 MG tablet   Oral   Take 81 mg by mouth daily.         Marland Kitchen CIALIS 20 MG tablet   Oral   Take 20 mg by mouth daily as needed for erectile dysfunction.          . famotidine (PEPCID) 20 MG tablet   Oral   Take 1 tablet (20 mg total) by mouth 2 (two) times daily.   30 tablet   0   .  glimepiride (AMARYL) 4 MG tablet   Oral   Take 4 mg by mouth daily before breakfast.          . HYDROcodone-acetaminophen (NORCO) 7.5-325 MG per tablet   Oral   Take 1 tablet by mouth every 4 (four) hours as needed for pain.   20 tablet   0   . metFORMIN (GLUCOPHAGE) 500 MG tablet   Oral   Take 500 mg by mouth 2 (two) times daily with a meal.         . Multiple Vitamin (MULTIVITAMIN) tablet   Oral   Take 1 tablet by mouth daily.         Marland Kitchen sulfamethoxazole-trimethoprim (SEPTRA DS) 800-160 MG per tablet   Oral   Take 1 tablet by mouth 2 (two) times daily. X 7 days   14 tablet   0   . TESTIM 50 MG/5GM GEL   Topical   Apply 5 g topically daily.           BP 134/60  Pulse 89  Temp(Src) 98.8 F (37.1 C) (Oral)  Resp 16  SpO2 100% Physical Exam  Nursing note and vitals reviewed. Constitutional: He is oriented to person, place, and time. He appears well-developed and well-nourished. No distress.  Neck: Neck supple.  Cardiovascular: Normal rate.   Pulmonary/Chest: Effort normal. No respiratory distress.  Abdominal: Soft. There is no tenderness.  Musculoskeletal: He exhibits no edema.  Neurological: He is alert and oriented to person, place, and time.  Skin: Skin is warm and dry.  There are 2 abscesses in the right axilla. There is a 3 x 3 cm abscess in the proximal humerus adjacent to the arm crease. A second larger area of induration and tenderness is 5 x 7 cm and located on the torso aspect of the axilla. The largest lesion is draining a white exudate. There is an abscess to the left axilla with a smaller area of induration and is draining a yellow/white exudate. There is also an additional slimy odiferous material over a slightly reddened area in the skin fold of the left inguinal and abdominal adipose.  Psychiatric: He has a normal mood and affect.    ED Course  INCISION AND DRAINAGE Date/Time: 03/26/2013 4:00 PM Performed by: Phineas Real, Ardean Simonich Authorized by: Phineas Real,  Bea Duren Consent: Verbal consent obtained. Risks and benefits: risks, benefits and alternatives were discussed Consent given by: patient Patient understanding: patient states understanding of the procedure being performed Type: abscess Body area: upper extremity Location details: right arm Anesthesia: local infiltration Local anesthetic: lidocaine 2% with epinephrine and lidocaine 2% without epinephrine Anesthetic total: 13 ml Patient sedated: no Scalpel size: 11 Incision type: single with marsupialization Complexity: complex Drainage: purulent Drainage amount: moderate Wound treatment: drain placed Packing material: 1/4 in gauze Comments: PTU abscesses in the right axilla were incised and drained and packed in similar fashion.   (including critical care time) Labs Review Labs Reviewed  CULTURE, ROUTINE-ABSCESS   Imaging Review No results found.  MDM   1. Abscess    Incision and drainage of the 2 separate abscesses in the right axilla. Absorbing dressing to the left inguinal abscess. This particular abscess was   getting smaller has a draining. Apply warm compresses to these areas. Return on the third day to have wound checks and packing removal. Septra DS one twice a day for 7 days Norco 7/2 mg every 4 hours when necessary pain For any worsening new symptoms or problems such as fever, nausea, vomiting, chills or worsening of the abscess lesions recheck promptly. Cultures were obtained to the abscesses of the right axilla and left inguinal abscesses.  Hayden Rasmussen, NP 03/26/13 2120

## 2013-03-27 NOTE — ED Provider Notes (Signed)
Medical screening examination/treatment/procedure(s) were performed by resident physician or non-physician practitioner and as supervising physician I was immediately available for consultation/collaboration.   Barkley Bruns MD.   Linna Hoff, MD 03/27/13 [phone]

## 2013-03-29 MED ORDER — CEPHALEXIN 500 MG PO CAPS: 500.0000 mg | ORAL_CAPSULE | Freq: Four times a day (QID) | ORAL | Status: DC

## 2013-03-30 ENCOUNTER — Telehealth (HOSPITAL_COMMUNITY): Payer: Self-pay | Admitting: *Deleted

## 2013-03-30 LAB — CULTURE, ROUTINE-ABSCESS

## 2013-03-30 NOTE — ED Notes (Signed)
Abscess culture L groin: few Group B strep (S. Agalactiae). Abscess culture R arm: Multiple bacterial types none predominant.  Pt. had I and D's done.  9/11 Hayden Rasmussen NP e-prescribed Keflex to the [facility] on [address]. I called pt.  Pt. verified x 2 and given results.  Pt. told he needs to take the Keflex for the strep infection in his L groin.  Pt. said he has already picked up the Rx. and is taking the medication.  Pt.'s questions answered. Pt. voiced understanding. Vassie Moselle 03/30/2013

## 2013-09-14 DIAGNOSIS — I4891 Unspecified atrial fibrillation: Secondary | ICD-10-CM

## 2013-09-14 HISTORY — DX: Unspecified atrial fibrillation: I48.91

## 2013-09-24 ENCOUNTER — Encounter (HOSPITAL_COMMUNITY): Payer: Self-pay | Admitting: Emergency Medicine

## 2013-09-24 ENCOUNTER — Emergency Department (HOSPITAL_COMMUNITY): Payer: 59

## 2013-09-24 ENCOUNTER — Emergency Department (HOSPITAL_COMMUNITY)
Admission: EM | Admit: 2013-09-24 | Discharge: 2013-09-24 | Disposition: A | Discharge status: Home / Self Care | Payer: 59 | Source: Home / Self Care | Attending: Family Medicine | Admitting: Family Medicine

## 2013-09-24 ENCOUNTER — Emergency Department (HOSPITAL_COMMUNITY)
Admission: EM | Admit: 2013-09-24 | Discharge: 2013-09-24 | Disposition: A | Discharge status: Home / Self Care | Payer: 59 | Attending: Emergency Medicine | Admitting: Emergency Medicine

## 2013-09-24 DIAGNOSIS — Z79899 Other long term (current) drug therapy: Secondary | ICD-10-CM | POA: Insufficient documentation

## 2013-09-24 DIAGNOSIS — Z880 Allergy status to penicillin: Secondary | ICD-10-CM | POA: Insufficient documentation

## 2013-09-24 DIAGNOSIS — R079 Chest pain, unspecified: Secondary | ICD-10-CM

## 2013-09-24 DIAGNOSIS — I4891 Unspecified atrial fibrillation: Secondary | ICD-10-CM

## 2013-09-24 DIAGNOSIS — R5381 Other malaise: Secondary | ICD-10-CM | POA: Insufficient documentation

## 2013-09-24 DIAGNOSIS — R0600 Dyspnea, unspecified: Secondary | ICD-10-CM

## 2013-09-24 DIAGNOSIS — R0609 Other forms of dyspnea: Secondary | ICD-10-CM

## 2013-09-24 DIAGNOSIS — E7800 Pure hypercholesterolemia, unspecified: Secondary | ICD-10-CM | POA: Insufficient documentation

## 2013-09-24 DIAGNOSIS — E119 Type 2 diabetes mellitus without complications: Secondary | ICD-10-CM | POA: Insufficient documentation

## 2013-09-24 DIAGNOSIS — R5383 Other fatigue: Secondary | ICD-10-CM

## 2013-09-24 DIAGNOSIS — F17200 Nicotine dependence, unspecified, uncomplicated: Secondary | ICD-10-CM | POA: Insufficient documentation

## 2013-09-24 DIAGNOSIS — R0989 Other specified symptoms and signs involving the circulatory and respiratory systems: Secondary | ICD-10-CM

## 2013-09-24 DIAGNOSIS — Z7982 Long term (current) use of aspirin: Secondary | ICD-10-CM | POA: Insufficient documentation

## 2013-09-24 LAB — CBC WITH DIFFERENTIAL/PLATELET
BASOS ABS: 0 10*3/uL (ref 0.0–0.1)
BASOS PCT: 0 % (ref 0–1)
Eosinophils Absolute: 0.3 10*3/uL (ref 0.0–0.7)
Eosinophils Relative: 3 % (ref 0–5)
HEMATOCRIT: 42.9 % (ref 39.0–52.0)
HEMOGLOBIN: 14.6 g/dL (ref 13.0–17.0)
LYMPHS PCT: 34 % (ref 12–46)
Lymphs Abs: 3.5 10*3/uL (ref 0.7–4.0)
MCH: 31.7 pg (ref 26.0–34.0)
MCHC: 34 g/dL (ref 30.0–36.0)
MCV: 93.1 fL (ref 78.0–100.0)
MONO ABS: 1.2 10*3/uL — AB (ref 0.1–1.0)
Monocytes Relative: 11 % (ref 3–12)
NEUTROS ABS: 5.4 10*3/uL (ref 1.7–7.7)
NEUTROS PCT: 52 % (ref 43–77)
Platelets: 250 10*3/uL (ref 150–400)
RBC: 4.61 MIL/uL (ref 4.22–5.81)
RDW: 13 % (ref 11.5–15.5)
WBC: 10.4 10*3/uL (ref 4.0–10.5)

## 2013-09-24 LAB — BASIC METABOLIC PANEL
BUN: 18 mg/dL (ref 6–23)
CHLORIDE: 105 meq/L (ref 96–112)
CO2: 22 mEq/L (ref 19–32)
CREATININE: 0.97 mg/dL (ref 0.50–1.35)
Calcium: 9.4 mg/dL (ref 8.4–10.5)
GFR, EST NON AFRICAN AMERICAN: 90 mL/min — AB (ref >90)
Glucose, Bld: 141 mg/dL — ABNORMAL HIGH (ref 70–99)
POTASSIUM: 4 meq/L (ref 3.7–5.3)
Sodium: 141 mEq/L (ref 137–147)

## 2013-09-24 LAB — PRO B NATRIURETIC PEPTIDE: PRO B NATRI PEPTIDE: 855.2 pg/mL — AB (ref 0–125)

## 2013-09-24 LAB — TROPONIN I

## 2013-09-24 MED ORDER — ASPIRIN 81 MG PO CHEW
324.0000 mg | CHEWABLE_TABLET | Freq: Once | ORAL | Status: AC
  Administered 2013-09-24: 324 mg via ORAL

## 2013-09-24 MED ORDER — SODIUM CHLORIDE 0.9 % IV SOLN
Freq: Once | INTRAVENOUS | Status: AC
Start: 2013-09-24 — End: 2013-09-24
  Administered 2013-09-24: 16:00:00 via INTRAVENOUS

## 2013-09-24 MED ORDER — SODIUM CHLORIDE 0.9 % IV SOLN: Freq: Once | INTRAVENOUS | Status: DC

## 2013-09-24 MED ORDER — ASPIRIN 81 MG PO CHEW
324.0000 mg | CHEWABLE_TABLET | Freq: Once | ORAL | Status: DC
  Filled 2013-09-24: qty 4

## 2013-09-24 MED ORDER — ASPIRIN 81 MG PO CHEW
CHEWABLE_TABLET | ORAL | Status: AC
  Filled 2013-09-24: qty 4

## 2013-09-24 NOTE — ED Notes (Signed)
Dr Zavitz at bedside  

## 2013-09-24 NOTE — ED Notes (Signed)
Patient transported to X-ray 

## 2013-09-24 NOTE — ED Notes (Signed)
Pt placed on cardiac monitor and O2 by nasal canula @ 3Lpm

## 2013-09-24 NOTE — ED Provider Notes (Signed)
CSN: [id number]     Arrival date & time 09/24/13  1714 History   First MD Initiated Contact with Patient 09/24/13 1730     Chief Complaint  Patient presents with  . Atrial Fibrillation     (Consider location/radiation/quality/duration/timing/severity/associated sxs/prior Treatment) HPI Comments: 58 yo male with DM, smoking, chol hx presents with intermittent dyspnea/ palpitations for the past two weeks. No hx of similar.  Patient denies blood clot history, active cancer, recent major trauma or surgery, unilateral leg swelling/ pain, recent long travel, hemoptysis.  No cp.  Pt no sxs currently.  No chf or cardiac hx.  Parent had CHF.  No exertional sxs.     Patient is a 58 y.o. male presenting with atrial fibrillation. The history is provided by the patient.  Atrial Fibrillation This is a new problem. Associated symptoms include shortness of breath. Pertinent negatives include no chest pain, no abdominal pain and no headaches.    Past Medical History  Diagnosis Date  . Diabetes mellitus without complication   . High cholesterol    Past Surgical History  Procedure Laterality Date  . Hand sugery     History reviewed. No pertinent family history. History  Substance Use Topics  . Smoking status: Current Every Day Smoker    Types: Cigarettes  . Smokeless tobacco: Never Used  . Alcohol Use: Yes     Comment: Social    Review of Systems  Constitutional: Positive for fatigue. Negative for fever and chills.  HENT: Negative for congestion.   Eyes: Negative for visual disturbance.  Respiratory: Positive for shortness of breath.   Cardiovascular: Positive for palpitations. Negative for chest pain and leg swelling.  Gastrointestinal: Negative for vomiting and abdominal pain.  Genitourinary: Negative for dysuria and flank pain.  Musculoskeletal: Negative for back pain, neck pain and neck stiffness.  Skin: Negative for rash.  Neurological: Negative for light-headedness and headaches.       Allergies  Penicillins  Home Medications   Current Outpatient Rx  Name  Route  Sig  Dispense  Refill  . aspirin 81 MG tablet   Oral   Take 81 mg by mouth daily.         Marland Kitchen. glimepiride (AMARYL) 4 MG tablet   Oral   Take 4 mg by mouth daily before breakfast.          . metFORMIN (GLUCOPHAGE) 500 MG tablet   Oral   Take 500 mg by mouth 2 (two) times daily with a meal.         . Multiple Vitamin (MULTIVITAMIN) tablet   Oral   Take 1 tablet by mouth daily.          BP 138/71  Pulse 94  Temp(Src) 98.1 F (36.7 C) (Oral)  Ht 6\' 1"  (1.854 m)  Wt 249 lb (112.946 kg)  BMI 32.86 kg/m2  SpO2 98% Physical Exam  Nursing note and vitals reviewed. Constitutional: He is oriented to person, place, and time. He appears well-developed and well-nourished.  HENT:  Head: Normocephalic and atraumatic.  Eyes: Conjunctivae are normal. Right eye exhibits no discharge. Left eye exhibits no discharge.  Neck: Normal range of motion. Neck supple. No tracheal deviation present.  Cardiovascular: Normal rate.  An irregularly irregular rhythm present.  Pulmonary/Chest: Effort normal and breath sounds normal.  Abdominal: Soft. He exhibits no distension. There is no tenderness. There is no guarding.  Musculoskeletal: He exhibits no edema.  Neurological: He is alert and oriented to person, place, and time.  No cranial nerve deficit.  Skin: Skin is warm. No rash noted.  Psychiatric: He has a normal mood and affect.    ED Course  Procedures (including critical care time) Labs Review Labs Reviewed  BASIC METABOLIC PANEL - Abnormal; Notable for the following:    Glucose, Bld 141 (*)    GFR calc non Af Amer 90 (*)    All other components within normal limits  CBC WITH DIFFERENTIAL - Abnormal; Notable for the following:    Monocytes Absolute 1.2 (*)    All other components within normal limits  PRO B NATRIURETIC PEPTIDE - Abnormal; Notable for the following:    Pro B Natriuretic  peptide (BNP) 855.2 (*)    All other components within normal limits  TROPONIN I  TSH   Imaging Review Dg Chest 2 View  09/24/2013   CLINICAL DATA Short of breath.  Chest flutter.  EXAM CHEST  2 VIEW  COMPARISON None. DG CHEST 1V PORT dated 11/25/2012  FINDINGS Cardiopericardial silhouette within normal limits. Mediastinal contours normal. Trachea midline. No airspace disease or effusion.  IMPRESSION No active cardiopulmonary disease.  SIGNATURE  Electronically Signed   By: Andreas Newport M.D.   On: 09/24/2013 18:56     EKG Interpretation None      Date: 09/24/2013  Rate: 89  Rhythm: atrial fib  QRS Axis: left  Intervals: normal  ST/T Wave abnormalities: nonspecific T wave changes  Conduction Disutrbances:none  Narrative Interpretation:   Old EKG Reviewed: changes noted   MDM   Final diagnoses:  Atrial fibrillation   New onset a fib, no cp, well appearing. Plan for labs, CXR due to mild dyspnea.  No PE risks. Pt has cardiac risks, no cp.  Plan for risk stratification for blood thinner and close fup with  Cardiology. Filed Vitals:   09/24/13 1920  BP: 136/71  Pulse: 72  Temp:   Resp: 24   HR controlled in ED.  Chadsvasc 1.  ASA full dose.  Further eval with cardiology in the next week.  Results and differential diagnosis were discussed with the patient. Close follow up outpatient was discussed, patient comfortable with the plan.         Enid Skeens, MD 09/24/13 2001

## 2013-09-24 NOTE — ED Notes (Signed)
Pt  Reports       Symptoms   Of        Shortness   Of  Breath       And  A  Tightness  In  Chest       For  About 1  Week         Pt  Seen  Recently  At  [facility]          And  States  He  Had  A  ekg  Blood  Work  And  X  Ray           At that  Time         At  This time  He  Is  Awake  And  Alert and  Oriented  With  Warm /  Dry  Skin  And  Speaking in  Complete  sentances     Pt states  Was at  Work today  And  The  Shortness of breath  Became   More  Pronounced

## 2013-09-24 NOTE — ED Provider Notes (Signed)
Medical screening examination/treatment/procedure(s) were performed by a resident physician or non-physician practitioner and as the supervising physician I was immediately available for consultation/collaboration.  Cohl Behrens, MD    Valda Christenson S Francisco Eyerly, MD 09/24/13 2114 

## 2013-09-24 NOTE — ED Provider Notes (Addendum)
CSN: [id number]     Arrival date & time 09/24/13  1507 History   First MD Initiated Contact with Patient 09/24/13 1600     Chief Complaint  Patient presents with  . Chest Pain   (Consider location/radiation/quality/duration/timing/severity/associated sxs/prior Treatment) HPI Comments: Two visits to local urgent care facilities for same in past 2 weeks. Has not been able to obtain appointment with his PCP for follow up. No previous cardiology evaluation.   Patient is a 58 y.o. Chen presenting with chest pain. The history is provided by the patient.  Chest Pain Pain location:  L chest Pain quality: tightness   Pain radiates to:  Does not radiate Pain radiates to the back: no   Pain severity:  Mild Duration:  3 weeks Timing:  Intermittent Chronicity:  New Relieved by:  Rest Associated symptoms: orthopnea, palpitations and shortness of breath   Associated symptoms: no nausea     Past Medical History  Diagnosis Date  . Diabetes mellitus without complication   . High cholesterol    Past Surgical History  Procedure Laterality Date  . Hand sugery     History reviewed. No pertinent family history. History  Substance Use Topics  . Smoking status: Current Every Day Smoker    Types: Cigarettes  . Smokeless tobacco: Never Used  . Alcohol Use: Yes     Comment: Social    Review of Systems  Respiratory: Positive for shortness of breath.   Cardiovascular: Positive for chest pain, palpitations and orthopnea.  Gastrointestinal: Negative for nausea.    Allergies  Penicillins  Home Medications   Current Outpatient Rx  Name  Route  Sig  Dispense  Refill  . aspirin 81 MG tablet   Oral   Take 81 mg by mouth daily.         . cephALEXin (KEFLEX) 500 MG capsule   Oral   Take 1 capsule (500 mg total) by mouth 4 (four) times daily.   28 capsule   0   . CIALIS 20 MG tablet   Oral   Take 20 mg by mouth daily as needed for erectile dysfunction.          . famotidine  (PEPCID) 20 MG tablet   Oral   Take 1 tablet (20 mg total) by mouth 2 (two) times daily.   30 tablet   0   . glimepiride (AMARYL) 4 MG tablet   Oral   Take 4 mg by mouth daily before breakfast.          . HYDROcodone-acetaminophen (NORCO) 7.5-325 MG per tablet   Oral   Take 1 tablet by mouth every 4 (four) hours as needed for pain.   20 tablet   0   . metFORMIN (GLUCOPHAGE) 500 MG tablet   Oral   Take 500 mg by mouth 2 (two) times daily with a meal.         . Multiple Vitamin (MULTIVITAMIN) tablet   Oral   Take 1 tablet by mouth daily.         Marland Kitchen sulfamethoxazole-trimethoprim (SEPTRA DS) 800-160 MG per tablet   Oral   Take 1 tablet by mouth 2 (two) times daily. X 7 days   14 tablet   0   . TESTIM 50 MG/5GM GEL   Topical   Apply 5 g topically daily.           BP 111/56  Pulse 103  Temp(Src) 99.1 F (37.3 C) (Oral)  Resp 18  SpO2  100% Physical Exam  Nursing note and vitals reviewed. Constitutional: He is oriented to person, place, and time. He appears well-developed and well-nourished. No distress.  HENT:  Head: Normocephalic and atraumatic.  Eyes: Conjunctivae are normal. No scleral icterus.  Neck: Normal range of motion. Neck supple.  Cardiovascular: Normal heart sounds.  An irregularly irregular rhythm present.  No murmur heard. Pulmonary/Chest: Effort normal and breath sounds normal. No respiratory distress. He has no wheezes.  Abdominal: Soft. Bowel sounds are normal. He exhibits no distension. There is no tenderness.  Musculoskeletal: Normal range of motion. He exhibits no edema and no tenderness.  Neurological: He is alert and oriented to person, place, and time.  Skin: Skin is warm and dry.  Psychiatric: He has a normal mood and affect. His behavior is normal.    ED Course  Procedures (including critical care time) Labs Review Labs Reviewed - No data to display Imaging Review No results found.   MDM   1. Chest pain   2. Dyspnea   3.  Atrial fibrillation   ECG: Atrial Fibrillation at rate of 89bpm  New onset atrial fibrillation with associated new onset angina, dyspnea and orthopnea in 58 y/o AA overweight, diabetic Chen with +family history of CHF (mother and brother around age 57). [facility]. VSS. [facility] and cardiology evaluation. Patient does use Cialis for ED and last dose was reported to be on 09/20/2013.    Jess BartersJennifer Lee [place], [place] 09/24/13 1627   09-25-2013: Upon routine chart review, I realized I neglected to record my physical exam of this patient on his date of service. Therefore, re-opened note to include physical exam and re-signed chart.   Jess BartersJennifer Lee [place], [place] 09/25/13 [phone]

## 2013-09-24 NOTE — ED Notes (Signed)
[facility] for eval of new onset a fib. Pt states flutter feeling in chest for a few weeks and feeling tired. NSD

## 2013-09-24 NOTE — Discharge Instructions (Signed)
Take daily 325 mg aspirin.  See the heart doctor for further management. Return for chest pain, heart rate > 110, passing out or stroke symptoms.  If you were given medicines take as directed.  If you are on coumadin or contraceptives realize their levels and effectiveness is altered by many different medicines.  If you have any reaction (rash, tongues swelling, other) to the medicines stop taking and see a physician.   Please follow up as directed and return to the ER or see a physician for new or worsening symptoms.  Thank you.  Atrial Fibrillation Atrial fibrillation is a type of irregular heart rhythm (arrhythmia). During atrial fibrillation, the upper chambers of the heart (atria) quiver continuously in a chaotic pattern. This causes an irregular and often rapid heart rate.  Atrial fibrillation is the result of the heart becoming overloaded with disorganized signals that tell it to beat. These signals are normally released one at a time by a part of the right atrium called the sinoatrial node. They then travel from the atria to the lower chambers of the heart (ventricles), causing the atria and ventricles to contract and pump blood as they pass. In atrial fibrillation, parts of the atria outside of the sinoatrial node also release these signals. This results in two problems. First, the atria receive so many signals that they do not have time to fully contract. Second, the ventricles, which can only receive one signal at a time, beat irregularly and out of rhythm with the atria.  There are three types of atrial fibrillation:   Paroxysmal Paroxysmal atrial fibrillation starts suddenly and stops on its own within a week.   Persistent Persistent atrial fibrillation lasts for more than a week. It may stop on its own or with treatment.   Permanent Permanent atrial fibrillation does not go away. Episodes of atrial fibrillation may lead to permanent atrial fibrillation.  Atrial fibrillation can  prevent your heart from pumping blood normally. It increases your risk of stroke and can lead to heart failure.  CAUSES   Heart conditions, including a heart attack, heart failure, coronary artery disease, and heart valve conditions.   Inflammation of the sac that surrounds the heart (pericarditis).   Blockage of an artery in the lungs (pulmonary embolism).   Pneumonia or other infections.   Chronic lung disease.   Thyroid problems, especially if the thyroid is overactive (hyperthyroidism).   Caffeine, excessive alcohol use, and use of some illegal drugs.   Use of some medications, including certain decongestants and diet pills.   Heart surgery.   Birth defects.  Sometimes, no cause can be found. When this happens, the atrial fibrillation is called lone atrial fibrillation. The risk of complications from atrial fibrillation increases if you have lone atrial fibrillation and you are age 54 years or older. RISK FACTORS  Heart failure.  Coronary artery disease  Diabetes mellitus.   High blood pressure (hypertension).   Obesity.   Other arrhythmias.   Increased age. SYMPTOMS   A feeling that your heart is beating rapidly or irregularly.   A feeling of discomfort or pain in your chest.   Shortness of breath.   Sudden lightheadedness or weakness.   Getting tired easily when exercising.   Urinating more often than normal (mainly when atrial fibrillation first begins).  In paroxysmal atrial fibrillation, symptoms may start and suddenly stop. DIAGNOSIS  Your caregiver may be able to detect atrial fibrillation when taking your pulse. Usually, testing is needed to diagnosis atrial  fibrillation. Tests may include:   Electrocardiography. During this test, the electrical impulses of your heart are recorded while you are lying down.   Echocardiography. During echocardiography, sound waves are used to evaluate how blood flows through your heart.    Stress test. There is more than one type of stress test. If a stress test is needed, ask your caregiver about which type is best for you.   Chest X-ray exam.   Blood tests.   Computed tomography (CT).  TREATMENT   Treating any underlying conditions. For example, if you have an overactive thyroid, treating the condition may correct atrial fibrillation.   Medication. Medications may be given to control a rapid heart rate or to prevent blood clots, heart failure, or a stroke.   Procedure to correct the rhythm of the heart:  Electrical cardioversion. During electrical cardioversion, a controlled, low-energy shock is delivered to the heart through your skin. If you have chest pain, very low pressure blood pressure, or sudden heart failure, this procedure may need to be done as an emergency.  Catheter ablation. During this procedure, heart tissues that send the signals that cause atrial fibrillation are destroyed.  Maze or minimaze procedure. During this surgery, thin lines of heart tissue that carry the abnormal signals are destroyed. The maze procedure is an open-heart surgery. The minimaze procedure is a minimally invasive surgery. This means that small cuts are made to access the heart instead of a large opening.  Pulmonary venous isolation. During this surgery, tissue around the veins that carry blood from the lungs (pulmonary veins) is destroyed. This tissue is thought to carry the abnormal signals. HOME CARE INSTRUCTIONS   Take medications as directed by your caregiver.  Only take medications that your caregiver approves. Some medications can make atrial fibrillation worse or recur.  If blood thinners were prescribed by your caregiver, take them exactly as directed. Too much can cause bleeding. Too little and you will not have the needed protection against stroke and other problems.  Perform blood tests at home if directed by your caregiver.  Perform blood tests exactly as  directed.   Quit smoking if you smoke.   Do not drink alcohol.   Do not drink caffeinated beverages such as coffee, soda, and some teas. You may drink decaffeinated coffee, soda, or tea.   Maintain a healthy weight. Do not use diet pills unless your caregiver approves. They may make heart problems worse.   Follow diet instructions as directed by your caregiver.   Exercise regularly as directed by your caregiver.   Keep all follow-up appointments. PREVENTION  The following substances can cause atrial fibrillation to recur:   Caffeinated beverages.   Alcohol.   Certain medications, especially those used for breathing problems.   Certain herbs and herbal medications, such as those containing ephedra or ginseng.  Illegal drugs such as cocaine and amphetamines. Sometimes medications are given to prevent atrial fibrillation from recurring. Proper treatment of any underlying condition is also important in helping prevent recurrence.  SEEK MEDICAL CARE IF:  You notice a change in the rate, rhythm, or strength of your heartbeat.   You suddenly begin urinating more frequently.   You tire more easily when exerting yourself or exercising.  SEEK IMMEDIATE MEDICAL CARE IF:   You develop chest pain, abdominal pain, sweating, or weakness.  You feel sick to your stomach (nauseous).  You develop shortness of breath.  You suddenly develop swollen feet and ankles.  You feel dizzy.  You  face or limbs feel numb or weak.  There is a change in your vision or speech. MAKE SURE YOU:   Understand these instructions.  Will watch your condition.  Will get help right away if you are not doing well or get worse. Document Released: 07/03/2005 Document Revised: 10/28/2012 Document Reviewed: 08/13/2012 [employer] Patient Information 2014 [place], [place].

## 2013-09-25 LAB — TSH: TSH: 2.25 u[IU]/mL (ref 0.350–4.500)

## 2013-09-26 NOTE — ED Provider Notes (Signed)
Medical screening examination/treatment/procedure(s) were performed by a resident physician or non-physician practitioner and as the supervising physician I was immediately available for consultation/collaboration.  Elorah Dewing, MD    Travaughn Vue S Kareema Keitt, MD 09/26/13 0736 

## 2014-02-10 ENCOUNTER — Emergency Department (HOSPITAL_COMMUNITY)
Admission: EM | Admit: 2014-02-10 | Discharge: 2014-02-10 | Disposition: A | Discharge status: Home / Self Care | Payer: 59 | Source: Home / Self Care

## 2014-02-10 ENCOUNTER — Encounter (HOSPITAL_COMMUNITY): Payer: Self-pay | Admitting: Emergency Medicine

## 2014-02-10 DIAGNOSIS — L02811 Cutaneous abscess of head [any part, except face]: Secondary | ICD-10-CM

## 2014-02-10 DIAGNOSIS — L02818 Cutaneous abscess of other sites: Secondary | ICD-10-CM

## 2014-02-10 DIAGNOSIS — L03818 Cellulitis of other sites: Secondary | ICD-10-CM

## 2014-02-10 NOTE — ED Notes (Signed)
Swollen  abcess   r  Side  Head       X  1  [place]  -  Tender  to  The  Touch         History  Of  abcess  In  Past

## 2014-02-10 NOTE — Discharge Instructions (Signed)
Warm compress twice a day .  return as needed. °

## 2014-02-10 NOTE — ED Provider Notes (Signed)
CSN: [id number]     Arrival date & time 02/10/14  1110 History   None    Chief Complaint  Patient presents with  . Abscess   (Consider location/radiation/quality/duration/timing/severity/associated sxs/prior Treatment) Patient is a 58 y.o. male presenting with abscess. The history is provided by the patient.  Abscess Location:  Head/neck Head/neck abscess location:  Scalp Abscess quality: fluctuance, painful and warmth   Red streaking: no   Duration:  1 month Progression:  Worsening Pain details:    Quality:  Dull   Severity:  Mild   Progression:  Worsening Chronicity:  Chronic Risk factors: prior abscess     Past Medical History  Diagnosis Date  . Diabetes mellitus without complication   . High cholesterol    Past Surgical History  Procedure Laterality Date  . Hand sugery     History reviewed. No pertinent family history. History  Substance Use Topics  . Smoking status: Current Every Day Smoker    Types: Cigarettes  . Smokeless tobacco: Never Used  . Alcohol Use: Yes     Comment: Social    Review of Systems  Constitutional: Negative.   Skin: Positive for rash.    Allergies  Penicillins  Home Medications   Prior to Admission medications   Medication Sig Start Date End Date Taking? Authorizing Provider  aspirin 81 MG tablet Take 81 mg by mouth daily.    Historical Provider, MD  glimepiride (AMARYL) 4 MG tablet Take 4 mg by mouth daily before breakfast.  06/27/12   Historical Provider, MD  metFORMIN (GLUCOPHAGE) 500 MG tablet Take 500 mg by mouth 2 (two) times daily with a meal.    Historical Provider, MD  Multiple Vitamin (MULTIVITAMIN) tablet Take 1 tablet by mouth daily.    Historical Provider, MD   BP 159/78  Pulse 90  Temp(Src) 98.4 F (36.9 C) (Oral)  Resp 18  SpO2 96% Physical Exam  Nursing note and vitals reviewed. Constitutional: He is oriented to person, place, and time. He appears well-developed and well-nourished. No distress.   Neurological: He is alert and oriented to person, place, and time.  Skin: Skin is warm and dry.  Abscess right parietal scalp, 1.3cm,    ED Course  INCISION AND DRAINAGE Date/Time: 02/10/2014 12:35 PM Performed by: Linna HoffKINDL, JAMES D Authorized by: Bradd CanaryKINDL, JAMES D Consent: Verbal consent obtained. Risks and benefits: risks, benefits and alternatives were discussed Consent given by: patient Type: abscess Body area: head/neck Location details: scalp Patient sedated: no Scalpel size: 11 Incision type: single straight Complexity: simple Drainage: purulent Drainage amount: moderate Wound treatment: wound left open Patient tolerance: Patient tolerated the procedure well with no immediate complications.   (including critical care time) Labs Review Labs Reviewed - No data to display  Imaging Review No results found.   MDM   1. Abscess, scalp        Linna HoffJames D Kindl, MD 02/10/14 1236

## 2014-10-12 ENCOUNTER — Encounter (HOSPITAL_COMMUNITY): Payer: Self-pay | Admitting: *Deleted

## 2014-10-12 ENCOUNTER — Encounter (HOSPITAL_COMMUNITY): Payer: Self-pay

## 2014-10-12 ENCOUNTER — Emergency Department (HOSPITAL_COMMUNITY): Payer: 59

## 2014-10-12 ENCOUNTER — Emergency Department (INDEPENDENT_AMBULATORY_CARE_PROVIDER_SITE_OTHER)
Admission: EM | Admit: 2014-10-12 | Discharge: 2014-10-12 | Disposition: A | Discharge status: Other Acute Inpatient Hospital | Payer: 59 | Source: Home / Self Care | Attending: Emergency Medicine | Admitting: Emergency Medicine

## 2014-10-12 ENCOUNTER — Inpatient Hospital Stay (HOSPITAL_COMMUNITY)
Admission: EM | Admit: 2014-10-12 | Discharge: 2014-10-24 | DRG: 854 | Disposition: A | Discharge status: Home Health | Payer: 59 | Attending: Internal Medicine | Admitting: Internal Medicine

## 2014-10-12 DIAGNOSIS — Z880 Allergy status to penicillin: Secondary | ICD-10-CM

## 2014-10-12 DIAGNOSIS — R109 Unspecified abdominal pain: Secondary | ICD-10-CM

## 2014-10-12 DIAGNOSIS — A419 Sepsis, unspecified organism: Secondary | ICD-10-CM | POA: Diagnosis not present

## 2014-10-12 DIAGNOSIS — R32 Unspecified urinary incontinence: Secondary | ICD-10-CM | POA: Diagnosis present

## 2014-10-12 DIAGNOSIS — I480 Paroxysmal atrial fibrillation: Secondary | ICD-10-CM | POA: Diagnosis present

## 2014-10-12 DIAGNOSIS — Z7982 Long term (current) use of aspirin: Secondary | ICD-10-CM | POA: Diagnosis not present

## 2014-10-12 DIAGNOSIS — Z9079 Acquired absence of other genital organ(s): Secondary | ICD-10-CM | POA: Diagnosis present

## 2014-10-12 DIAGNOSIS — Z22322 Carrier or suspected carrier of Methicillin resistant Staphylococcus aureus: Secondary | ICD-10-CM

## 2014-10-12 DIAGNOSIS — Z6832 Body mass index (BMI) 32.0-32.9, adult: Secondary | ICD-10-CM

## 2014-10-12 DIAGNOSIS — E860 Dehydration: Secondary | ICD-10-CM | POA: Diagnosis present

## 2014-10-12 DIAGNOSIS — E119 Type 2 diabetes mellitus without complications: Secondary | ICD-10-CM | POA: Diagnosis present

## 2014-10-12 DIAGNOSIS — Z79899 Other long term (current) drug therapy: Secondary | ICD-10-CM

## 2014-10-12 DIAGNOSIS — E780 Pure hypercholesterolemia: Secondary | ICD-10-CM | POA: Diagnosis present

## 2014-10-12 DIAGNOSIS — L739 Follicular disorder, unspecified: Secondary | ICD-10-CM | POA: Insufficient documentation

## 2014-10-12 DIAGNOSIS — L0501 Pilonidal cyst with abscess: Secondary | ICD-10-CM | POA: Diagnosis present

## 2014-10-12 DIAGNOSIS — I4891 Unspecified atrial fibrillation: Secondary | ICD-10-CM | POA: Diagnosis not present

## 2014-10-12 DIAGNOSIS — F17210 Nicotine dependence, cigarettes, uncomplicated: Secondary | ICD-10-CM | POA: Diagnosis present

## 2014-10-12 DIAGNOSIS — E118 Type 2 diabetes mellitus with unspecified complications: Secondary | ICD-10-CM | POA: Diagnosis not present

## 2014-10-12 DIAGNOSIS — R159 Full incontinence of feces: Secondary | ICD-10-CM | POA: Diagnosis present

## 2014-10-12 DIAGNOSIS — N179 Acute kidney failure, unspecified: Secondary | ICD-10-CM | POA: Diagnosis present

## 2014-10-12 DIAGNOSIS — R1032 Left lower quadrant pain: Secondary | ICD-10-CM

## 2014-10-12 DIAGNOSIS — I248 Other forms of acute ischemic heart disease: Secondary | ICD-10-CM | POA: Diagnosis not present

## 2014-10-12 DIAGNOSIS — K5720 Diverticulitis of large intestine with perforation and abscess without bleeding: Secondary | ICD-10-CM | POA: Diagnosis not present

## 2014-10-12 DIAGNOSIS — K5780 Diverticulitis of intestine, part unspecified, with perforation and abscess without bleeding: Secondary | ICD-10-CM | POA: Insufficient documentation

## 2014-10-12 DIAGNOSIS — F17200 Nicotine dependence, unspecified, uncomplicated: Secondary | ICD-10-CM | POA: Diagnosis present

## 2014-10-12 HISTORY — DX: Unspecified atrial fibrillation: I48.91

## 2014-10-12 LAB — URINALYSIS, ROUTINE W REFLEX MICROSCOPIC
BILIRUBIN URINE: NEGATIVE
GLUCOSE, UA: 250 mg/dL — AB
HGB URINE DIPSTICK: NEGATIVE
Ketones, ur: NEGATIVE mg/dL
Leukocytes, UA: NEGATIVE
Nitrite: NEGATIVE
PH: 6.5 (ref 5.0–8.0)
PROTEIN: 100 mg/dL — AB
Specific Gravity, Urine: 1.024 (ref 1.005–1.030)
Urobilinogen, UA: 1 mg/dL (ref 0.0–1.0)

## 2014-10-12 LAB — COMPREHENSIVE METABOLIC PANEL
ALT: 17 U/L (ref 0–53)
AST: 16 U/L (ref 0–37)
Albumin: 3.5 g/dL (ref 3.5–5.2)
Alkaline Phosphatase: 56 U/L (ref 39–117)
Anion gap: 10 (ref 5–15)
BUN: 15 mg/dL (ref 6–23)
CALCIUM: 9.1 mg/dL (ref 8.4–10.5)
CO2: 22 mmol/L (ref 19–32)
CREATININE: 1.24 mg/dL (ref 0.50–1.35)
Chloride: 106 mmol/L (ref 96–112)
GFR, EST AFRICAN AMERICAN: 72 mL/min — AB (ref >90)
GFR, EST NON AFRICAN AMERICAN: 62 mL/min — AB (ref >90)
Glucose, Bld: 190 mg/dL — ABNORMAL HIGH (ref 70–99)
Potassium: 4.4 mmol/L (ref 3.5–5.1)
Sodium: 138 mmol/L (ref 135–145)
Total Bilirubin: 0.8 mg/dL (ref 0.3–1.2)
Total Protein: 8.4 g/dL — ABNORMAL HIGH (ref 6.0–8.3)

## 2014-10-12 LAB — CBC WITH DIFFERENTIAL/PLATELET
BASOS ABS: 0 10*3/uL (ref 0.0–0.1)
Basophils Relative: 0 % (ref 0–1)
EOS PCT: 2 % (ref 0–5)
Eosinophils Absolute: 0.3 10*3/uL (ref 0.0–0.7)
HCT: 40.6 % (ref 39.0–52.0)
Hemoglobin: 13.5 g/dL (ref 13.0–17.0)
Lymphocytes Relative: 15 % (ref 12–46)
Lymphs Abs: 2.4 10*3/uL (ref 0.7–4.0)
MCH: 31.1 pg (ref 26.0–34.0)
MCHC: 33.3 g/dL (ref 30.0–36.0)
MCV: 93.5 fL (ref 78.0–100.0)
MONO ABS: 1.6 10*3/uL — AB (ref 0.1–1.0)
Monocytes Relative: 10 % (ref 3–12)
Neutro Abs: 11.8 10*3/uL — ABNORMAL HIGH (ref 1.7–7.7)
Neutrophils Relative %: 73 % (ref 43–77)
Platelets: 277 10*3/uL (ref 150–400)
RBC: 4.34 MIL/uL (ref 4.22–5.81)
RDW: 12.7 % (ref 11.5–15.5)
WBC: 16.1 10*3/uL — ABNORMAL HIGH (ref 4.0–10.5)

## 2014-10-12 LAB — URINE MICROSCOPIC-ADD ON

## 2014-10-12 LAB — GLUCOSE, CAPILLARY: GLUCOSE-CAPILLARY: 156 mg/dL — AB (ref 70–99)

## 2014-10-12 MED ORDER — SODIUM CHLORIDE 0.9 % IV SOLN
INTRAVENOUS | Status: DC
  Administered 2014-10-12: 21:00:00 via INTRAVENOUS

## 2014-10-12 MED ORDER — VANCOMYCIN HCL 10 G IV SOLR
2000.0000 mg | Freq: Once | INTRAVENOUS | Status: AC
  Administered 2014-10-12: 2000 mg via INTRAVENOUS
  Filled 2014-10-12: qty 2000

## 2014-10-12 MED ORDER — METRONIDAZOLE IN NACL 5-0.79 MG/ML-% IV SOLN
500.0000 mg | Freq: Once | INTRAVENOUS | Status: AC
  Administered 2014-10-12: 500 mg via INTRAVENOUS
  Filled 2014-10-12: qty 100

## 2014-10-12 MED ORDER — ONDANSETRON HCL 4 MG PO TABS: 4.0000 mg | ORAL_TABLET | Freq: Four times a day (QID) | ORAL | Status: DC | PRN

## 2014-10-12 MED ORDER — HYDROMORPHONE HCL 1 MG/ML IJ SOLN
1.0000 mg | Freq: Once | INTRAMUSCULAR | Status: AC
Start: 2014-10-12 — End: 2014-10-12
  Administered 2014-10-12: 1 mg via INTRAVENOUS
  Filled 2014-10-12: qty 1

## 2014-10-12 MED ORDER — SODIUM CHLORIDE 0.9 % IV SOLN
INTRAVENOUS | Status: DC
  Administered 2014-10-12: 15:00:00 via INTRAVENOUS

## 2014-10-12 MED ORDER — MORPHINE SULFATE 2 MG/ML IJ SOLN
2.0000 mg | INTRAMUSCULAR | Status: DC | PRN
  Administered 2014-10-12 – 2014-10-15 (×14): 2 mg via INTRAVENOUS
  Filled 2014-10-12 (×15): qty 1

## 2014-10-12 MED ORDER — SODIUM CHLORIDE 0.9 % IV BOLUS (SEPSIS)
500.0000 mL | Freq: Once | INTRAVENOUS | Status: AC
  Administered 2014-10-12: 500 mL via INTRAVENOUS

## 2014-10-12 MED ORDER — ONDANSETRON HCL 4 MG/2ML IJ SOLN
4.0000 mg | Freq: Once | INTRAMUSCULAR | Status: AC
  Administered 2014-10-12: 4 mg via INTRAVENOUS
  Filled 2014-10-12: qty 2

## 2014-10-12 MED ORDER — DOXYCYCLINE HYCLATE 100 MG IV SOLR
100.0000 mg | Freq: Two times a day (BID) | INTRAVENOUS | Status: DC
  Administered 2014-10-12 – 2014-10-13 (×2): 100 mg via INTRAVENOUS
  Filled 2014-10-12 (×3): qty 100

## 2014-10-12 MED ORDER — ACETAMINOPHEN 325 MG PO TABS
650.0000 mg | ORAL_TABLET | Freq: Four times a day (QID) | ORAL | Status: DC | PRN
  Administered 2014-10-12 – 2014-10-14 (×3): 650 mg via ORAL
  Filled 2014-10-12 (×3): qty 2

## 2014-10-12 MED ORDER — ASPIRIN 81 MG PO CHEW
81.0000 mg | CHEWABLE_TABLET | Freq: Every day | ORAL | Status: DC
  Administered 2014-10-12 – 2014-10-23 (×12): 81 mg via ORAL
  Filled 2014-10-12 (×13): qty 1

## 2014-10-12 MED ORDER — ACETAMINOPHEN 650 MG RE SUPP: 650.0000 mg | Freq: Four times a day (QID) | RECTAL | Status: DC | PRN

## 2014-10-12 MED ORDER — HEPARIN SODIUM (PORCINE) 5000 UNIT/ML IJ SOLN
5000.0000 [IU] | Freq: Three times a day (TID) | INTRAMUSCULAR | Status: DC
  Administered 2014-10-12 – 2014-10-17 (×10): 5000 [IU] via SUBCUTANEOUS
  Filled 2014-10-12 (×15): qty 1

## 2014-10-12 MED ORDER — METRONIDAZOLE IN NACL 5-0.79 MG/ML-% IV SOLN
500.0000 mg | Freq: Three times a day (TID) | INTRAVENOUS | Status: DC
  Administered 2014-10-12 – 2014-10-13 (×2): 500 mg via INTRAVENOUS
  Filled 2014-10-12 (×3): qty 100

## 2014-10-12 MED ORDER — CIPROFLOXACIN IN D5W 400 MG/200ML IV SOLN
400.0000 mg | Freq: Two times a day (BID) | INTRAVENOUS | Status: DC
  Administered 2014-10-13: 400 mg via INTRAVENOUS
  Filled 2014-10-12: qty 200

## 2014-10-12 MED ORDER — IOHEXOL 300 MG/ML  SOLN
100.0000 mL | Freq: Once | INTRAMUSCULAR | Status: AC | PRN
  Administered 2014-10-12: 100 mL via INTRAVENOUS

## 2014-10-12 MED ORDER — CIPROFLOXACIN IN D5W 400 MG/200ML IV SOLN
400.0000 mg | Freq: Once | INTRAVENOUS | Status: AC
  Administered 2014-10-12: 400 mg via INTRAVENOUS
  Filled 2014-10-12: qty 200

## 2014-10-12 MED ORDER — INSULIN ASPART 100 UNIT/ML ~~LOC~~ SOLN
0.0000 [IU] | Freq: Four times a day (QID) | SUBCUTANEOUS | Status: DC
  Administered 2014-10-12: 3 [IU] via SUBCUTANEOUS

## 2014-10-12 MED ORDER — ONDANSETRON HCL 4 MG/2ML IJ SOLN: 4.0000 mg | Freq: Four times a day (QID) | INTRAMUSCULAR | Status: DC | PRN

## 2014-10-12 MED ORDER — SODIUM CHLORIDE 0.9 % IV BOLUS (SEPSIS)
1000.0000 mL | Freq: Once | INTRAVENOUS | Status: AC
Start: 2014-10-12 — End: 2014-10-12

## 2014-10-12 MED ORDER — IOHEXOL 300 MG/ML  SOLN
25.0000 mL | Freq: Once | INTRAMUSCULAR | Status: AC | PRN
  Administered 2014-10-12: 25 mL via ORAL

## 2014-10-12 MED ORDER — ONDANSETRON HCL 4 MG/2ML IJ SOLN
4.0000 mg | Freq: Once | INTRAMUSCULAR | Status: AC
  Administered 2014-10-12: 4 mg via INTRAMUSCULAR

## 2014-10-12 MED ORDER — HYDROMORPHONE HCL 1 MG/ML IJ SOLN
2.0000 mg | Freq: Once | INTRAMUSCULAR | Status: AC
  Administered 2014-10-12: 2 mg via INTRAMUSCULAR

## 2014-10-12 MED ORDER — ONDANSETRON HCL 4 MG/2ML IJ SOLN
INTRAMUSCULAR | Status: AC
  Filled 2014-10-12: qty 2

## 2014-10-12 MED ORDER — HYDROMORPHONE HCL 1 MG/ML IJ SOLN
INTRAMUSCULAR | Status: AC
  Filled 2014-10-12: qty 2

## 2014-10-12 NOTE — Consult Note (Signed)
Reason for Consult:diverticular abscess Referring Physician: Dr. Aida Raider is an 59 y.o. male.  HPI: We have been asked to evaluate this patient from a surgical standpoint regarding the diagnosis of a diverticular abscess on CAT scan. The patient reports that he started having left lower quadrant abdominal pain and left groin pain over the weekend. He has had some loose bowel movements. He described the pain as sharp and constant. He has not had similar abdominal pain before. He has a history of having had what sounds like a scrotal abscess eventually resulting in an orchiectomy. He has also had drainage from chronic infection at the gluteal cleft consistent with a possible pilonidal infection. He denies fevers or chills. The pain is moderate in intensity. He reports some slight fecal incontinence when urinating.  Past Medical History  Diagnosis Date  . Diabetes mellitus without complication   . High cholesterol     Past Surgical History  Procedure Laterality Date  . Hand sugery      History reviewed. No pertinent family history.  Social History:  reports that he has been smoking Cigarettes.  He has never used smokeless tobacco. He reports that he drinks alcohol. He reports that he does not use illicit drugs.  Allergies:  Allergies  Allergen Reactions  . Penicillins Swelling    Medications: I have reviewed the patient's current medications.  Results for orders placed or performed during the hospital encounter of 10/12/14 (from the past 48 hour(s))  CBC with Differential     Status: Abnormal   Collection Time: 10/12/14 11:28 AM  Result Value Ref Range   WBC 16.1 (H) 4.0 - 10.5 K/uL   RBC 4.34 4.22 - 5.81 MIL/uL   Hemoglobin 13.5 13.0 - 17.0 g/dL   HCT 40.6 39.0 - 52.0 %   MCV 93.5 78.0 - 100.0 fL   MCH 31.1 26.0 - 34.0 pg   MCHC 33.3 30.0 - 36.0 g/dL   RDW 12.7 11.5 - 15.5 %   Platelets 277 150 - 400 K/uL   Neutrophils Relative % 73 43 - 77 %   Neutro Abs 11.8  (H) 1.7 - 7.7 K/uL   Lymphocytes Relative 15 12 - 46 %   Lymphs Abs 2.4 0.7 - 4.0 K/uL   Monocytes Relative 10 3 - 12 %   Monocytes Absolute 1.6 (H) 0.1 - 1.0 K/uL   Eosinophils Relative 2 0 - 5 %   Eosinophils Absolute 0.3 0.0 - 0.7 K/uL   Basophils Relative 0 0 - 1 %   Basophils Absolute 0.0 0.0 - 0.1 K/uL  Comprehensive metabolic panel     Status: Abnormal   Collection Time: 10/12/14 11:28 AM  Result Value Ref Range   Sodium 138 135 - 145 mmol/L   Potassium 4.4 3.5 - 5.1 mmol/L   Chloride 106 96 - 112 mmol/L   CO2 22 19 - 32 mmol/L   Glucose, Bld 190 (H) 70 - 99 mg/dL   BUN 15 6 - 23 mg/dL   Creatinine, Ser 1.24 0.50 - 1.35 mg/dL   Calcium 9.1 8.4 - 10.5 mg/dL   Total Protein 8.4 (H) 6.0 - 8.3 g/dL   Albumin 3.5 3.5 - 5.2 g/dL   AST 16 0 - 37 U/L   ALT 17 0 - 53 U/L   Alkaline Phosphatase 56 39 - 117 U/L   Total Bilirubin 0.8 0.3 - 1.2 mg/dL   GFR calc non Af Amer 62 (L) >90 mL/min   GFR calc  Af Amer 72 (L) >90 mL/min    Comment: (NOTE) The eGFR has been calculated using the CKD EPI equation. This calculation has not been validated in all clinical situations. eGFR's persistently <90 mL/min signify possible Chronic Kidney Disease.    Anion gap 10 5 - 15  Urinalysis, Routine w reflex microscopic     Status: Abnormal   Collection Time: 10/12/14 11:47 AM  Result Value Ref Range   Color, Urine AMBER (A) YELLOW    Comment: BIOCHEMICALS MAY BE AFFECTED BY COLOR   APPearance CLEAR CLEAR   Specific Gravity, Urine 1.024 1.005 - 1.030   pH 6.5 5.0 - 8.0   Glucose, UA 250 (A) NEGATIVE mg/dL   Hgb urine dipstick NEGATIVE NEGATIVE   Bilirubin Urine NEGATIVE NEGATIVE   Ketones, ur NEGATIVE NEGATIVE mg/dL   Protein, ur 100 (A) NEGATIVE mg/dL   Urobilinogen, UA 1.0 0.0 - 1.0 mg/dL   Nitrite NEGATIVE NEGATIVE   Leukocytes, UA NEGATIVE NEGATIVE  Urine microscopic-add on     Status: None   Collection Time: 10/12/14 11:47 AM  Result Value Ref Range   WBC, UA 0-2 <3 WBC/hpf   RBC  / HPF 0-2 <3 RBC/hpf    Ct Abdomen Pelvis W Contrast  10/12/2014   CLINICAL DATA:  59yom, lower abd pain, n/v/d, fever, and cysts on buttocks and in his groin. Past medical history includes DM.  EXAM: CT ABDOMEN AND PELVIS WITH CONTRAST  TECHNIQUE: Multidetector CT imaging of the abdomen and pelvis was performed using the standard protocol following bolus administration of intravenous contrast.  CONTRAST:  179m OMNIPAQUE IOHEXOL 300 MG/ML  SOLN  COMPARISON:  03/24/2006  FINDINGS: There is a heterogeneous collection contiguous with the posterior margin of the mid sigmoid colon measuring 4 cm x 3.3 cm x 3.6 cm. Is contiguous with an area of inflammatory changes small extraluminal bubbles of air that tracks along the course of the central left iliac vessels. The contiguous sigmoid colon shows mild wall thickening and multiple diverticula. Hazy inflammatory changes seen in the adjacent fat. Findings are most consistent with a very diverticular abscess.  There are additional scattered left colon diverticula without other evidence of diverticulitis. Remainder of the colon is unremarkable. Normal appendix is visualized. Normal small bowel.  There are prominent inguinal and external iliac chain lymph nodes. Largest measure 1 cm short axis. No other adenopathy. No ascites.  Lung bases essentially clear. Liver shows diffuse fatty infiltration. No liver mass or focal lesion.  Spleen, gallbladder, pancreas, adrenal glands, kidneys, ureters and bladder: Unremarkable.  Mild degenerative changes noted of the visualized spine. No osteoblastic or osteolytic lesions.  IMPRESSION: 1. Sigmoid colon diverticulitis with a very diverticular abscess measuring 4 cm in greatest dimension and a small amount of adjacent, contained, extraluminal air. 2. No other acute abnormalities. 3. Hepatic steatosis.   Electronically Signed   By: DLajean ManesM.D.   On: 10/12/2014 17:15    Review of Systems  All other systems reviewed and are  negative.  Blood pressure 146/70, pulse 102, temperature 100.8 F (38.2 C), temperature source Oral, resp. rate 35, height 6' 2"  (1.88 m), weight 113.399 kg (250 lb), SpO2 98 %. Physical Exam  Constitutional:  Morbidly obese, lying on his side, in no acute distress  HENT:  Head: Normocephalic and atraumatic.  Right Ear: External ear normal.  Left Ear: External ear normal.  Nose: Nose normal.  Mouth/Throat: Oropharynx is clear and moist. No oropharyngeal exudate.  Eyes: Conjunctivae are normal. Pupils are  equal, round, and reactive to light. No scleral icterus.  Neck: Normal range of motion. No tracheal deviation present.  Cardiovascular: Normal rate, regular rhythm, normal heart sounds and intact distal pulses.   No murmur heard. Respiratory: Effort normal. No respiratory distress.  GI: Soft. There is tenderness. There is guarding.  His abdomen is obese. There is moderate tenderness with guarding in the left lower quadrant. The rest of the abdomen is soft and not appreciably tender  Genitourinary:  There is an absent right testicle  There is a chronic sinus tract at the gluteal cleft consistent with chronic pilonidal infections  Musculoskeletal: Normal range of motion. He exhibits no edema or tenderness.  Neurological: He is alert.  Skin: Skin is warm and dry. He is not diaphoretic. No erythema.  Psychiatric: His behavior is normal. Judgment normal.    Assessment/Plan: Diverticulitis with abscess  He'll be admitted to the medical service and placed on IV antibiotics and bowel rest. Interventional radiology will be asked to evaluate the patient in the morning for consideration of a percutaneous drain into the abscess. Hopefully the abscess can be drained without the need for surgical resection and colostomy. The patient understands that if the abscess cannot be drained that he may need to have surgery urgently with an ostomy. I explained this to him in detail. We will follow him  closely with you.  Latysha Thackston A 10/12/2014, 8:14 PM

## 2014-10-12 NOTE — ED Notes (Signed)
CT informed patient through with oral contrast.

## 2014-10-12 NOTE — ED Notes (Signed)
Pt  Reports   abd  Pain    With  Fever

## 2014-10-12 NOTE — ED Notes (Signed)
Pt reports  Symptoms  Of  Fever     And  l  Sided  abd  Pain  That  Began  About  1  Week  Ago worse  thover  The  Weekend  sev  Days  Ago     -  Pt  Also  Reports  Draining  Boils       On  Perineal  Area    Back  Of  Head         And  r arm        -  Pt  Has  Nausea  No  Vomiting

## 2014-10-12 NOTE — Progress Notes (Signed)
ANTIBIOTIC CONSULT NOTE - INITIAL  Pharmacy Consult for cipro Indication: Intra-abdominal infection  Allergies  Allergen Reactions  . Penicillins Swelling    Patient Measurements: Height: 6\' 2"  (188 cm) Weight: 250 lb (113.399 kg) IBW/kg (Calculated) : 82.2   Vital Signs: Temp: 100.8 F (38.2 C) (03/28 1528) Temp Source: Oral (03/28 1528) BP: 146/70 mmHg (03/28 2000) Pulse Rate: 102 (03/28 1928) Intake/Output from previous day:   Intake/Output from this shift: Total I/O In: 1300 [I.V.:1300] Out: -   Labs:  Recent Labs  10/12/14 1128  WBC 16.1*  HGB 13.5  PLT 277  CREATININE 1.24   Estimated Creatinine Clearance: 87 mL/min (by C-G formula based on Cr of 1.24). No results for input(s): VANCOTROUGH, VANCOPEAK, VANCORANDOM, GENTTROUGH, GENTPEAK, GENTRANDOM, TOBRATROUGH, TOBRAPEAK, TOBRARND, AMIKACINPEAK, AMIKACINTROU, AMIKACIN in the last 72 hours.   Microbiology: No results found for this or any previous visit (from the past 720 hour(s)).  Medical History: Past Medical History  Diagnosis Date  . Diabetes mellitus without complication   . High cholesterol     Medications:  Prescriptions prior to admission  Medication Sig Dispense Refill Last Dose  . aspirin 81 MG tablet Take 81 mg by mouth daily.   10/11/2014  . glimepiride (AMARYL) 4 MG tablet Take 4 mg by mouth daily before breakfast.    10/11/2014  . metFORMIN (GLUCOPHAGE) 500 MG tablet Take 500 mg by mouth 2 (two) times daily with a meal.   10/11/2014  . Multiple Vitamin (MULTIVITAMIN) tablet Take 1 tablet by mouth daily.   10/11/2014   Assessment: 59 yo man to start cipro for intra abdominal infection.  He received a dose of cipro earlier today.  His CrCl ~87 ml/min.  Goal of Therapy:  Eradication of infection  Plan:  Cipro 400 mg IV q12 hours F/u clinical course and cultures  Thanks for allowing pharmacy to be a part of this patient's care.  Talbert CageLora Joanna Borawski, PharmD Clinical Pharmacist,  [phone]  10/12/2014,8:48 PM

## 2014-10-12 NOTE — ED Provider Notes (Addendum)
CSN: [id number]     Arrival date & time 10/12/14  1105 History   First MD Initiated Contact with Patient 10/12/14 1232     Chief Complaint  Patient presents with  . Groin Pain     (Consider location/radiation/quality/duration/timing/severity/associated sxs/prior Treatment) Patient is a 59 y.o. male presenting with groin pain. The history is provided by the patient.  Groin Pain Associated symptoms include abdominal pain. Pertinent negatives include no chest pain, no headaches and no shortness of breath.   the patient referred here from [facility] urgent care. Patient with feeling of fever. Patient about a week ago began feeling Bad all over. Over the weekend started with some left groin pain. Also has a draining abscess on the buttocks area. Patient seems to have a history of hidradenitis. Had frequent cyst and abscesses in the groin area and axillary area. Patient states that there is some left lower quadrant abdominal pain as well. Patient's regular doctor is Dr. Valentina Lucks. Patient is a known diabetic. Temperature here 100.6.  Past Medical History  Diagnosis Date  . Diabetes mellitus without complication   . High cholesterol    Past Surgical History  Procedure Laterality Date  . Hand sugery     History reviewed. No pertinent family history. History  Substance Use Topics  . Smoking status: Current Every Day Smoker    Types: Cigarettes  . Smokeless tobacco: Never Used  . Alcohol Use: Yes     Comment: Social    Review of Systems  Constitutional: Positive for fever and diaphoresis.  HENT: Negative for congestion.   Eyes: Negative for redness.  Respiratory: Negative for shortness of breath.   Cardiovascular: Negative for chest pain.  Gastrointestinal: Positive for abdominal pain.  Genitourinary: Positive for scrotal swelling. Negative for dysuria.  Skin: Negative for rash.  Allergic/Immunologic: Positive for immunocompromised state.  Neurological: Negative for headaches.   Hematological: Does not bruise/bleed easily.  Psychiatric/Behavioral: Negative for confusion.      Allergies  Penicillins  Home Medications   Prior to Admission medications   Medication Sig Start Date End Date Taking? Authorizing Provider  aspirin 81 MG tablet Take 81 mg by mouth daily.   Yes Historical Provider, MD  glimepiride (AMARYL) 4 MG tablet Take 4 mg by mouth daily before breakfast.  06/27/12  Yes Historical Provider, MD  metFORMIN (GLUCOPHAGE) 500 MG tablet Take 500 mg by mouth 2 (two) times daily with a meal.   Yes Historical Provider, MD  Multiple Vitamin (MULTIVITAMIN) tablet Take 1 tablet by mouth daily.   Yes Historical Provider, MD   BP 153/79 mmHg  Pulse 98  Temp(Src) 100.8 F (38.2 C) (Oral)  Resp 32  Ht  (1.88 m)  Wt 250 lb (113.399 kg)  BMI 32.08 kg/m2  SpO2 96% Physical Exam  Constitutional: He is oriented to person, place, and time. He appears well-developed and well-nourished. No distress.  HENT:  Head: Normocephalic and atraumatic.  Mouth/Throat: Oropharynx is clear and moist.  Eyes: Conjunctivae and EOM are normal. Pupils are equal, round, and reactive to light.  Neck: Normal range of motion.  Cardiovascular: Normal rate, regular rhythm and normal heart sounds.   No murmur heard. Pulmonary/Chest: Effort normal and breath sounds normal. No respiratory distress.  Abdominal: Soft. Bowel sounds are normal. There is tenderness.  Tenderness left groin but no real tenderness left lower quadrant.  Genitourinary: Penis normal.  Patient with pilonidal cyst is actively draining yellow pus. Rectal exam with some tenderness but no mass. Testicle missing  from the right scrotum. Left scrotum without any swelling. Groin area with some induration but no distinct abscess cavities. Old scars due to hidradenitis.  Musculoskeletal: Normal range of motion.  Neurological: He is alert and oriented to person, place, and time. No cranial nerve deficit. He exhibits  normal muscle tone. Coordination normal.  Skin: Skin is warm.  Nursing note and vitals reviewed.   ED Course  Procedures (including critical care time) Labs Review Labs Reviewed  CBC WITH DIFFERENTIAL/PLATELET - Abnormal; Notable for the following:    WBC 16.1 (*)    Neutro Abs 11.8 (*)    Monocytes Absolute 1.6 (*)    All other components within normal limits  COMPREHENSIVE METABOLIC PANEL - Abnormal; Notable for the following:    Glucose, Bld 190 (*)    Total Protein 8.4 (*)    GFR calc non Af Amer 62 (*)    GFR calc Af Amer 72 (*)    All other components within normal limits  URINALYSIS, ROUTINE W REFLEX MICROSCOPIC - Abnormal; Notable for the following:    Color, Urine AMBER (*)    Glucose, UA 250 (*)    Protein, ur 100 (*)    All other components within normal limits  URINE MICROSCOPIC-ADD ON   Results for orders placed or performed during the hospital encounter of 10/12/14  CBC with Differential  Result Value Ref Range   WBC 16.1 (H) 4.0 - 10.5 K/uL   RBC 4.34 4.22 - 5.81 MIL/uL   Hemoglobin 13.5 13.0 - 17.0 g/dL   HCT 16.140.6 09.639.0 - 04.552.0 %   MCV 93.5 78.0 - 100.0 fL   MCH 31.1 26.0 - 34.0 pg   MCHC 33.3 30.0 - 36.0 g/dL   RDW 40.912.7 81.111.5 - 91.415.5 %   Platelets 277 150 - 400 K/uL   Neutrophils Relative % 73 43 - 77 %   Neutro Abs 11.8 (H) 1.7 - 7.7 K/uL   Lymphocytes Relative 15 12 - 46 %   Lymphs Abs 2.4 0.7 - 4.0 K/uL   Monocytes Relative 10 3 - 12 %   Monocytes Absolute 1.6 (H) 0.1 - 1.0 K/uL   Eosinophils Relative 2 0 - 5 %   Eosinophils Absolute 0.3 0.0 - 0.7 K/uL   Basophils Relative 0 0 - 1 %   Basophils Absolute 0.0 0.0 - 0.1 K/uL  Comprehensive metabolic panel  Result Value Ref Range   Sodium 138 135 - 145 mmol/L   Potassium 4.4 3.5 - 5.1 mmol/L   Chloride 106 96 - 112 mmol/L   CO2 22 19 - 32 mmol/L   Glucose, Bld 190 (H) 70 - 99 mg/dL   BUN 15 6 - 23 mg/dL   Creatinine, Ser 7.821.24 0.50 - 1.35 mg/dL   Calcium 9.1 8.4 - 95.610.5 mg/dL   Total Protein 8.4  (H) 6.0 - 8.3 g/dL   Albumin 3.5 3.5 - 5.2 g/dL   AST 16 0 - 37 U/L   ALT 17 0 - 53 U/L   Alkaline Phosphatase 56 39 - 117 U/L   Total Bilirubin 0.8 0.3 - 1.2 mg/dL   GFR calc non Af Amer 62 (L) >90 mL/min   GFR calc Af Amer 72 (L) >90 mL/min   Anion gap 10 5 - 15  Urinalysis, Routine w reflex microscopic  Result Value Ref Range   Color, Urine AMBER (A) YELLOW   APPearance CLEAR CLEAR   Specific Gravity, Urine 1.024 1.005 - 1.030   pH 6.5 5.0 -  8.0   Glucose, UA 250 (A) NEGATIVE mg/dL   Hgb urine dipstick NEGATIVE NEGATIVE   Bilirubin Urine NEGATIVE NEGATIVE   Ketones, ur NEGATIVE NEGATIVE mg/dL   Protein, ur 161 (A) NEGATIVE mg/dL   Urobilinogen, UA 1.0 0.0 - 1.0 mg/dL   Nitrite NEGATIVE NEGATIVE   Leukocytes, UA NEGATIVE NEGATIVE  Urine microscopic-add on  Result Value Ref Range   WBC, UA 0-2 <3 WBC/hpf   RBC / HPF 0-2 <3 RBC/hpf     Imaging Review No results found.   EKG Interpretation None      MDM   Final diagnoses:  Abdominal pain  Groin pain, left  Pilonidal abscess    Patient with some fecal incontinence. Patient definitely has a draining pilonidal cyst. Patient also has some tender areas along the both groin areas. No actual fluctuant cyst. Patient clearly has a history of hidradenitis. Has plenty of the old scars from previous cyst. Increased tenderness up along the left groin but do not think that there is a hernia there. CT scan ordered to rule out a perirectal abscess or any deep space infections in those areas. Otherwise patient can probably be treated with antibiotics and follow-up with surgery. Patient's definitely had a problem with this in the past. 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 Today without evidence of infection in the scrotum.  Patient does not appear toxic. Not hypotensive no tachycardia.    Vanetta Mulders, MD 10/12/14 1616  Vanetta Mulders, MD 10/12/14 [phone]

## 2014-10-12 NOTE — ED Notes (Signed)
Npo

## 2014-10-12 NOTE — Discharge Instructions (Signed)
To ED for evaluation

## 2014-10-12 NOTE — H&P (Signed)
[facility] History and Physical  Patient: Leroy Chen  MRN: [id number]  DOB: Sep 16, 1955  DOS: the patient was seen and examined on 10/12/2014 PCP: Irven Shelling, MD  Chief Complaint: Multiple skin lesions, left groin pain  HPI: Leroy Chen is a 59 y.o. male with Past medical history of diabetes mellitus, history of hidradenitis, recurrent skin infections. The patient is presenting with complaints of left groin pain as well as multiple skin lesions. He mentions that he has noted swelling in his right groin as well as a few swellings on his anal area 2 weeks ago. There was no pain. Later on 3-4 days ago this swelling started draining. And he was planning to see his primary care physician for them. Since last 2 days he has been having left groin pain which is worsening with bowel movement. He also had some fever and chills. He denies any chest pain shortness of breath or cough. Denies any dizziness or lightheadedness. He reports some incontinence when urinating. He mentions he has difficulty urinating. He mentions around of his medications a few weeks ago.  The patient is coming from home. And at his baseline independent for most of his ADL.  Review of Systems: as mentioned in the history of present illness.  A Comprehensive review of the other systems is negative.  Past Medical History  Diagnosis Date  . Diabetes mellitus without complication   . High cholesterol    Past Surgical History  Procedure Laterality Date  . Hand sugery    . Orchiectomy     Social History:  reports that he has been smoking Cigarettes.  He has never used smokeless tobacco. He reports that he drinks alcohol. He reports that he does not use illicit drugs.  Allergies  Allergen Reactions  . Penicillins Swelling    History reviewed. No pertinent family history.  Prior to Admission medications   Medication Sig Start Date End Date Taking? Authorizing Provider  aspirin 81 MG tablet Take  81 mg by mouth daily.   Yes Historical Provider, MD  glimepiride (AMARYL) 4 MG tablet Take 4 mg by mouth daily before breakfast.  06/27/12  Yes Historical Provider, MD  metFORMIN (GLUCOPHAGE) 500 MG tablet Take 500 mg by mouth 2 (two) times daily with a meal.   Yes Historical Provider, MD  Multiple Vitamin (MULTIVITAMIN) tablet Take 1 tablet by mouth daily.   Yes Historical Provider, MD    Physical Exam: Filed Vitals:   10/12/14 1900 10/12/14 1928 10/12/14 1930 10/12/14 2000  BP:  131/50 133/48 146/70  Pulse:  102    Temp:      TempSrc:      Resp: 33 27 36 35  Height:      Weight:      SpO2:  98%  98%    General: Alert, Awake and Oriented to Time, Place and Person. Appear in mild distress Eyes: PERRL ENT: Oral Mucosa clear moist. Neck: no JVD Cardiovascular: S1 and S2 Present, no Murmur, Peripheral Pulses Present Respiratory: Bilateral Air entry equal and Decreased, Clear to Auscultation, noCrackles, no wheezes Abdomen: Bowel Sound present, Soft and   left-sided tender Skin: Multiple follicular swelling  Extremities:  Trace Pedal edema, no calf tenderness Neurologic: Grossly no focal neuro deficit.  Labs on Admission:  CBC:  Recent Labs Lab 10/12/14 1128  WBC 16.1*  NEUTROABS 11.8*  HGB 13.5  HCT 40.6  MCV 93.5  PLT 277    CMP     Component Value Date/Time  NA 138 10/12/2014 1128   K 4.4 10/12/2014 1128   CL 106 10/12/2014 1128   CO2 22 10/12/2014 1128   GLUCOSE 190* 10/12/2014 1128   BUN 15 10/12/2014 1128   CREATININE 1.24 10/12/2014 1128   CALCIUM 9.1 10/12/2014 1128   PROT 8.4* 10/12/2014 1128   ALBUMIN 3.5 10/12/2014 1128   AST 16 10/12/2014 1128   ALT 17 10/12/2014 1128   ALKPHOS 56 10/12/2014 1128   BILITOT 0.8 10/12/2014 1128   GFRNONAA 62* 10/12/2014 1128   GFRAA 72* 10/12/2014 1128    No results for input(s): LIPASE, AMYLASE in the last 168 hours.  No results for input(s): CKTOTAL, CKMB, CKMBINDEX, TROPONINI in the last 168 hours. BNP  (last 3 results) No results for input(s): BNP in the last 8760 hours.  ProBNP (last 3 results) No results for input(s): PROBNP in the last 8760 hours.   Radiological Exams on Admission: Ct Abdomen Pelvis W Contrast  10/12/2014   CLINICAL DATA:  59yom, lower abd pain, n/v/d, fever, and cysts on buttocks and in his groin. Past medical history includes DM.  EXAM: CT ABDOMEN AND PELVIS WITH CONTRAST  TECHNIQUE: Multidetector CT imaging of the abdomen and pelvis was performed using the standard protocol following bolus administration of intravenous contrast.  CONTRAST:  175m OMNIPAQUE IOHEXOL 300 MG/ML  SOLN  COMPARISON:  03/24/2006  FINDINGS: There is a heterogeneous collection contiguous with the posterior margin of the mid sigmoid colon measuring 4 cm x 3.3 cm x 3.6 cm. Is contiguous with an area of inflammatory changes small extraluminal bubbles of air that tracks along the course of the central left iliac vessels. The contiguous sigmoid colon shows mild wall thickening and multiple diverticula. Hazy inflammatory changes seen in the adjacent fat. Findings are most consistent with a very diverticular abscess.  There are additional scattered left colon diverticula without other evidence of diverticulitis. Remainder of the colon is unremarkable. Normal appendix is visualized. Normal small bowel.  There are prominent inguinal and external iliac chain lymph nodes. Largest measure 1 cm short axis. No other adenopathy. No ascites.  Lung bases essentially clear. Liver shows diffuse fatty infiltration. No liver mass or focal lesion.  Spleen, gallbladder, pancreas, adrenal glands, kidneys, ureters and bladder: Unremarkable.  Mild degenerative changes noted of the visualized spine. No osteoblastic or osteolytic lesions.  IMPRESSION: 1. Sigmoid colon diverticulitis with a very diverticular abscess measuring 4 cm in greatest dimension and a small amount of adjacent, contained, extraluminal air. 2. No other acute  abnormalities. 3. Hepatic steatosis.   Electronically Signed   By: DLajean ManesM.D.   On: 10/12/2014 17:15   Assessment/Plan Principal Problem:   Colonic diverticular abscess Active Problems:   Folliculitis   Sepsis   Diabetes mellitus, type 2   1. Colonic diverticular abscess  Sepsis The pt has evidence of source of infection in CT scan suggesting diverticular abscess as well as folliculitis he has S. Creatinine 1.24, Suggesting SOFA score of 2 With this the patient is meeting criteria for sepsis. He will be treated aggressively with Cipro and Flagyl. He will also be given doxycycline to cover him for folliculitis. Will follow cultures. We will follow surgical recommendations for both folliculitis as well as for diverticular abscess. Patient will remain nothing by mouth except medications at present. Patient will be given 1 L normal saline bolus and will be continued on IV fluids. Check ESR and CRP.  2. diabetes mellitus type 2. Check hemoglobin A1c. Placing him on sliding  scale.  3. mild renal insufficiency. Aggressive IV hydration. Monitor ins and outs.  Advance goals of care discussion:  Full code   Consults: Gen. surgery  DVT Prophylaxis: subcutaneous Heparin Nutrition: Nothing by mouth  Disposition: Admitted to inpatient in telemetry unit.  Author: Berle Mull, MD [facility] Pager: [phone] 10/12/2014, 8:39 PM    If 7PM-7AM, please contact night-coverage [email or web address] Password [email or web address]

## 2014-10-12 NOTE — ED Provider Notes (Signed)
Assumed care of pt from Dr. Deretha EmoryZackowski awaiting CT scan for ro perirectal abscess.    CT scan demonstrated diverticular abscess.  Consulted surgery who requested medical admission.   Admitted in stable condition.  Groin pain, left  Abdominal pain - Plan: CT Abdomen Pelvis W Contrast, CT Abdomen Pelvis W Contrast  Pilonidal abscess     Mirian MoMatthew Jaydeen Darley, MD 10/14/14 [phone]

## 2014-10-12 NOTE — ED Notes (Signed)
MD at bedside. 

## 2014-10-12 NOTE — ED Provider Notes (Signed)
CSN: [id number]     Arrival date & time 10/12/14  [id number] History   First MD Initiated Contact with Patient 10/12/14 1012     Chief Complaint  Patient presents with  . Abdominal Pain   (Consider location/radiation/quality/duration/timing/severity/associated sxs/prior Treatment) Patient is a 59 y.o. male presenting with abdominal pain. The history is provided by the patient. No language interpreter was used.  Abdominal Pain Pain location:  LLQ Pain quality: aching and throbbing   Pain radiates to:  LLQ Pain severity:  Moderate Onset quality:  Gradual Duration:  1 week Timing:  Constant Progression:  Worsening Context: not recent illness and not suspicious food intake   Relieved by:  Nothing Worsened by:  Nothing tried Ineffective treatments:  None tried Associated symptoms: anorexia and flatus   Risk factors: has not had multiple surgeries     Past Medical History  Diagnosis Date  . Diabetes mellitus without complication   . High cholesterol    Past Surgical History  Procedure Laterality Date  . Hand sugery     History reviewed. No pertinent family history. History  Substance Use Topics  . Smoking status: Current Every Day Smoker    Types: Cigarettes  . Smokeless tobacco: Never Used  . Alcohol Use: Yes     Comment: Social    Review of Systems  Gastrointestinal: Positive for abdominal pain, anorexia and flatus.  All other systems reviewed and are negative.   Allergies  Penicillins  Home Medications   Prior to Admission medications   Medication Sig Start Date End Date Taking? Authorizing Provider  aspirin 81 MG tablet Take 81 mg by mouth daily.    Historical Provider, MD  glimepiride (AMARYL) 4 MG tablet Take 4 mg by mouth daily before breakfast.  06/27/12   Historical Provider, MD  metFORMIN (GLUCOPHAGE) 500 MG tablet Take 500 mg by mouth 2 (two) times daily with a meal.    Historical Provider, MD  Multiple Vitamin (MULTIVITAMIN) tablet Take 1 tablet by mouth  daily.    Historical Provider, MD   BP 140/80 mmHg  Pulse 78  Temp(Src) 100.6 F (38.1 C) (Oral)  Resp 18  SpO2 100% Physical Exam  Constitutional: He is oriented to person, place, and time. He appears well-developed and well-nourished.  HENT:  Head: Normocephalic.  Eyes: EOM are normal.  Neck: Normal range of motion.  Pulmonary/Chest: Effort normal.  Abdominal: Soft. He exhibits no distension. There is tenderness.  Tender left lower quadrant  Musculoskeletal: Normal range of motion.  Neurological: He is alert and oriented to person, place, and time.  Psychiatric: He has a normal mood and affect.  Nursing note and vitals reviewed.   ED Course  Procedures (including critical care time) Labs Review Labs Reviewed - No data to display  Imaging Review No results found.   MDM   1. Left lower quadrant pain     pt is febrile uncomfortable.  Pt given dilaudid and zofra im.  Pt to ED     Elson AreasLeslie K Sofia, PA-C 10/12/14 1043

## 2014-10-12 NOTE — ED Notes (Signed)
Pt here for groin pain, sent from [facility] to have furthur work up. Painful ambulation, slight swelling reported.

## 2014-10-13 LAB — GLUCOSE, CAPILLARY
Glucose-Capillary: 141 mg/dL — ABNORMAL HIGH (ref 70–99)
Glucose-Capillary: 159 mg/dL — ABNORMAL HIGH (ref 70–99)
Glucose-Capillary: 176 mg/dL — ABNORMAL HIGH (ref 70–99)
Glucose-Capillary: 178 mg/dL — ABNORMAL HIGH (ref 70–99)

## 2014-10-13 LAB — CBC
HEMATOCRIT: 36.9 % — AB (ref 39.0–52.0)
HEMOGLOBIN: 12.1 g/dL — AB (ref 13.0–17.0)
MCH: 30.6 pg (ref 26.0–34.0)
MCHC: 32.8 g/dL (ref 30.0–36.0)
MCV: 93.2 fL (ref 78.0–100.0)
PLATELETS: 247 10*3/uL (ref 150–400)
RBC: 3.96 MIL/uL — ABNORMAL LOW (ref 4.22–5.81)
RDW: 12.9 % (ref 11.5–15.5)
WBC: 15.7 10*3/uL — ABNORMAL HIGH (ref 4.0–10.5)

## 2014-10-13 LAB — COMPREHENSIVE METABOLIC PANEL
ALK PHOS: 55 U/L (ref 39–117)
ALT: 15 U/L (ref 0–53)
AST: 15 U/L (ref 0–37)
Albumin: 2.9 g/dL — ABNORMAL LOW (ref 3.5–5.2)
Anion gap: 7 (ref 5–15)
BUN: 10 mg/dL (ref 6–23)
CALCIUM: 8.3 mg/dL — AB (ref 8.4–10.5)
CO2: 25 mmol/L (ref 19–32)
CREATININE: 1.2 mg/dL (ref 0.50–1.35)
Chloride: 102 mmol/L (ref 96–112)
GFR, EST AFRICAN AMERICAN: 75 mL/min — AB (ref >90)
GFR, EST NON AFRICAN AMERICAN: 65 mL/min — AB (ref >90)
Glucose, Bld: 188 mg/dL — ABNORMAL HIGH (ref 70–99)
Potassium: 3.7 mmol/L (ref 3.5–5.1)
Sodium: 134 mmol/L — ABNORMAL LOW (ref 135–145)
Total Bilirubin: 1.8 mg/dL — ABNORMAL HIGH (ref 0.3–1.2)
Total Protein: 7.3 g/dL (ref 6.0–8.3)

## 2014-10-13 LAB — PROTIME-INR
INR: 1.21 (ref 0.00–1.49)
Prothrombin Time: 15.5 seconds — ABNORMAL HIGH (ref 11.6–15.2)

## 2014-10-13 LAB — SEDIMENTATION RATE: Sed Rate: 70 mm/hr — ABNORMAL HIGH (ref 0–16)

## 2014-10-13 LAB — C-REACTIVE PROTEIN: CRP: 16.7 mg/dL — ABNORMAL HIGH (ref <0.60)

## 2014-10-13 MED ORDER — MEROPENEM 1 G IV SOLR
1.0000 g | Freq: Three times a day (TID) | INTRAVENOUS | Status: DC
  Administered 2014-10-13 – 2014-10-22 (×28): 1 g via INTRAVENOUS
  Filled 2014-10-13 (×32): qty 1

## 2014-10-13 MED ORDER — ACETAMINOPHEN 500 MG PO TABS
500.0000 mg | ORAL_TABLET | Freq: Once | ORAL | Status: AC
  Administered 2014-10-13: 500 mg via ORAL
  Filled 2014-10-13: qty 1

## 2014-10-13 MED ORDER — VANCOMYCIN HCL IN DEXTROSE 1-5 GM/200ML-% IV SOLN
1000.0000 mg | Freq: Two times a day (BID) | INTRAVENOUS | Status: DC
  Administered 2014-10-14 – 2014-10-16 (×6): 1000 mg via INTRAVENOUS
  Filled 2014-10-13 (×7): qty 200

## 2014-10-13 MED ORDER — SODIUM CHLORIDE 0.9 % IV SOLN
INTRAVENOUS | Status: DC
  Administered 2014-10-13 – 2014-10-14 (×2): via INTRAVENOUS

## 2014-10-13 MED ORDER — VANCOMYCIN HCL IN DEXTROSE 1-5 GM/200ML-% IV SOLN
1000.0000 mg | INTRAVENOUS | Status: AC
  Administered 2014-10-13: 1000 mg via INTRAVENOUS
  Filled 2014-10-13: qty 200

## 2014-10-13 MED ORDER — CETYLPYRIDINIUM CHLORIDE 0.05 % MT LIQD
7.0000 mL | Freq: Two times a day (BID) | OROMUCOSAL | Status: DC
  Administered 2014-10-13 – 2014-10-24 (×19): 7 mL via OROMUCOSAL

## 2014-10-13 MED ORDER — INSULIN ASPART 100 UNIT/ML ~~LOC~~ SOLN
0.0000 [IU] | Freq: Four times a day (QID) | SUBCUTANEOUS | Status: DC
  Administered 2014-10-13: 3 [IU] via SUBCUTANEOUS
  Administered 2014-10-13: 2 [IU] via SUBCUTANEOUS
  Administered 2014-10-13 (×2): 3 [IU] via SUBCUTANEOUS
  Administered 2014-10-14 (×2): 2 [IU] via SUBCUTANEOUS
  Administered 2014-10-14: 6 [IU] via SUBCUTANEOUS
  Administered 2014-10-14: 2 [IU] via SUBCUTANEOUS
  Administered 2014-10-15 (×2): 3 [IU] via SUBCUTANEOUS
  Administered 2014-10-15 (×2): 2 [IU] via SUBCUTANEOUS
  Administered 2014-10-16 (×4): 3 [IU] via SUBCUTANEOUS
  Administered 2014-10-16: 2 [IU] via SUBCUTANEOUS
  Administered 2014-10-17 (×2): 3 [IU] via SUBCUTANEOUS
  Administered 2014-10-17 – 2014-10-18 (×2): 2 [IU] via SUBCUTANEOUS
  Administered 2014-10-18: 3 [IU] via SUBCUTANEOUS
  Administered 2014-10-18: 2 [IU] via SUBCUTANEOUS
  Administered 2014-10-18: 3 [IU] via SUBCUTANEOUS
  Administered 2014-10-19: 2 [IU] via SUBCUTANEOUS
  Administered 2014-10-19: 3 [IU] via SUBCUTANEOUS
  Administered 2014-10-19 – 2014-10-22 (×8): 2 [IU] via SUBCUTANEOUS
  Administered 2014-10-24: 3 [IU] via SUBCUTANEOUS
  Administered 2014-10-24: 2 [IU] via SUBCUTANEOUS

## 2014-10-13 NOTE — Progress Notes (Addendum)
Patient Demographics  Leroy Chen, is a 59 y.o. male, DOB - 12/29/1955, ZOX:[id number]  Admit date - 10/12/2014   Admitting Physician Rolly SalterPranav M Patel, MD  Outpatient Primary MD for the patient is Lillia MountainGRIFFIN,JOHN JOSEPH, MD  LOS - 1   Chief Complaint  Patient presents with  . Groin Pain        Subjective:   Leroy StandardAnthony Kaine today has, No headache, No chest pain, +ve lower abdominal pain - No Nausea, No new weakness tingling or numbness, No Cough - SOB.    Assessment & Plan    1. Colonic diverticular abscess. General surgery following, switched to meropenem per general surgery I agree. No signs of folliculitis on my exam. Per general surgery bowel rest, IV fluids, IV antibiotics. Gen. surgery has requested IR to place a drain. If fails he will require surgical intervention.   2. DM type II. For now ISS  No results found for: HGBA1C  CBG (last 3)   Recent Labs  10/12/14 2105 10/13/14 10/13/14 0517  GLUCAP 156* 176* 178*     3. Mildly care. Due to dehydration resolved with hydration , continue IV fluids.    4. Pilinoidal abscess draining-  - CCS following, Added Vanco to Meropenam.      Code Status: full  Family Communication: none  Disposition Plan: Home   Procedures   CT abdomen pelvis   Consults  CCS-IR   Medications  Scheduled Meds: . antiseptic oral rinse  7 mL Mouth Rinse BID  . aspirin  81 mg Oral Daily  . heparin  5,000 Units Subcutaneous 3 times per day  . insulin aspart  0-15 Units Subcutaneous 4 times per day  . meropenem (MERREM) IV  1 g Intravenous 3 times per day   Continuous Infusions: . sodium chloride 100 mL/hr at 10/12/14 2102   PRN Meds:.acetaminophen **OR** acetaminophen, morphine injection, ondansetron **OR** ondansetron (ZOFRAN) IV  DVT  Prophylaxis    Heparin -    Lab Results  Component Value Date   PLT 247 10/13/2014    Antibiotics     Anti-infectives    Start     Dose/Rate Route Frequency Ordered Stop   10/13/14 0745  meropenem (MERREM) 1 g in sodium chloride 0.9 % 100 mL IVPB     1 g 200 mL/hr over 30 Minutes Intravenous 3 times per day 10/13/14 0739     10/13/14 0600  ciprofloxacin (CIPRO) IVPB 400 mg  Status:  Discontinued     400 mg 200 mL/hr over 60 Minutes Intravenous Every 12 hours 10/12/14 2052 10/13/14 0739   10/12/14 2130  doxycycline (VIBRAMYCIN) 100 mg in dextrose 5 % 250 mL IVPB  Status:  Discontinued     100 mg 125 mL/hr over 120 Minutes Intravenous Every 12 hours 10/12/14 2037 10/13/14 0909   10/12/14 2045  metroNIDAZOLE (FLAGYL) IVPB 500 mg  Status:  Discontinued     500 mg 100 mL/hr over 60 Minutes Intravenous Every 8 hours 10/12/14 2033 10/13/14 0739   10/12/14 1800  vancomycin (VANCOCIN) 2,000 mg in sodium chloride 0.9 % 500 mL IVPB     2,000 mg 250 mL/hr over 120 Minutes Intravenous  Once 10/12/14 1736 10/12/14 2040   10/12/14 1745  ciprofloxacin (CIPRO) IVPB  400 mg     400 mg 200 mL/hr over 60 Minutes Intravenous  Once 10/12/14 1736 10/12/14 1946   10/12/14 1745  metroNIDAZOLE (FLAGYL) IVPB 500 mg     500 mg 100 mL/hr over 60 Minutes Intravenous  Once 10/12/14 1736 10/12/14 1904          Objective:   Filed Vitals:   10/12/14 2058 10/12/14 2252 10/13/14 0436 10/13/14 0527  BP: 132/59   134/59  Pulse: 100   93  Temp: 100.5 F (38.1 C) 99.6 F (37.6 C)  99.7 F (37.6 C)  TempSrc: Oral   Oral  Resp: 18   24  Height:  (1.88 m)     Weight: 112.674 kg (248 lb 6.4 oz)  113.1 kg (249 lb 5.4 oz)   SpO2: 93%   93%    Wt Readings from Last 3 Encounters:  10/13/14 113.1 kg (249 lb 5.4 oz)  09/24/13 112.946 kg (249 lb)  08/02/12 116.847 kg (257 lb 9.6 oz)     Intake/Output Summary (Last 24 hours) at 10/13/14 0909 Last data filed at 10/13/14 0847  Gross per 24 hour    Intake   3075 ml  Output      0 ml  Net   3075 ml     Physical Exam  Awake Alert, Oriented X 3, No new F.N deficits, Normal affect Kid[place].AT,PERRAL Supple Neck,No JVD, No cervical lymphadenopathy appriciated.  Symmetrical Chest wall movement, Good air movement bilaterally, CTAB RRR,No Gallops,Rubs or new Murmurs, No Parasternal Heave +ve B.Sounds, Abd Soft, lower abdominal tenderness, No organomegaly appriciated, No rebound - guarding or rigidity. No Cyanosis, Clubbing or edema, No new Rash or bruise      Data Review   Micro Results No results found for this or any previous visit (from the past 240 hour(s)).  Radiology Reports Ct Abdomen Pelvis W Contrast  10/12/2014   CLINICAL DATA:  59yom, lower abd pain, n/v/d, fever, and cysts on buttocks and in his groin. Past medical history includes DM.  EXAM: CT ABDOMEN AND PELVIS WITH CONTRAST  TECHNIQUE: Multidetector CT imaging of the abdomen and pelvis was performed using the standard protocol following bolus administration of intravenous contrast.  CONTRAST:  OMNIPAQUE IOHEXOL 300 MG/ML  SOLN  COMPARISON:  03/24/2006  FINDINGS: There is a heterogeneous collection contiguous with the posterior margin of the mid sigmoid colon measuring 4 cm x 3.3 cm x 3.6 cm. Is contiguous with an area of inflammatory changes small extraluminal bubbles of air that tracks along the course of the central left iliac vessels. The contiguous sigmoid colon shows mild wall thickening and multiple diverticula. Hazy inflammatory changes seen in the adjacent fat. Findings are most consistent with a very diverticular abscess.  There are additional scattered left colon diverticula without other evidence of diverticulitis. Remainder of the colon is unremarkable. Normal appendix is visualized. Normal small bowel.  There are prominent inguinal and external iliac chain lymph nodes. Largest measure 1 cm short axis. No other adenopathy. No ascites.  Lung bases essentially clear.  Liver shows diffuse fatty infiltration. No liver mass or focal lesion.  Spleen, gallbladder, pancreas, adrenal glands, kidneys, ureters and bladder: Unremarkable.  Mild degenerative changes noted of the visualized spine. No osteoblastic or osteolytic lesions.  IMPRESSION: 1. Sigmoid colon diverticulitis with a very diverticular abscess measuring 4 cm in greatest dimension and a small amount of adjacent, contained, extraluminal air. 2. No other acute abnormalities. 3. Hepatic steatosis.   Electronically Signed  By: Amie Portland M.D.   On: 10/12/2014 17:15     CBC  Recent Labs Lab 10/12/14 1128 10/13/14 0515  WBC 16.1* 15.7*  HGB 13.5 12.1*  HCT 40.6 36.9*  PLT 277 247  MCV 93.5 93.2  MCH 31.1 30.6  MCHC 33.3 32.8  RDW 12.7 12.9  LYMPHSABS 2.4  --   MONOABS 1.6*  --   EOSABS 0.3  --   BASOSABS 0.0  --     Chemistries   Recent Labs Lab 10/12/14 1128 10/13/14 0515  NA 138 134*  K 4.4 3.7  CL 106 102  CO2 22 25  GLUCOSE 190* 188*  BUN 15 10  CREATININE 1.24 1.20  CALCIUM 9.1 8.3*  AST 16 15  ALT 17 15  ALKPHOS 56 55  BILITOT 0.8 1.8*   ------------------------------------------------------------------------------------------------------------------ estimated creatinine clearance is 89.8 mL/min (by C-G formula based on Cr of 1.2). ------------------------------------------------------------------------------------------------------------------ No results for input(s): HGBA1C in the last 72 hours. ------------------------------------------------------------------------------------------------------------------ No results for input(s): CHOL, HDL, LDLCALC, TRIG, CHOLHDL, LDLDIRECT in the last 72 hours. ------------------------------------------------------------------------------------------------------------------ No results for input(s): TSH, T4TOTAL, T3FREE, THYROIDAB in the last 72 hours.  Invalid input(s):  FREET3 ------------------------------------------------------------------------------------------------------------------ No results for input(s): VITAMINB12, FOLATE, FERRITIN, TIBC, IRON, RETICCTPCT in the last 72 hours.  Coagulation profile  Recent Labs Lab 10/13/14 0515  INR 1.21    No results for input(s): DDIMER in the last 72 hours.  Cardiac Enzymes No results for input(s): CKMB, TROPONINI, MYOGLOBIN in the last 168 hours.  Invalid input(s): CK ------------------------------------------------------------------------------------------------------------------ Invalid input(s): POCBNP     Time Spent in minutes   35   SINGH,PRASHANT K M.D on 10/13/2014 at 9:09 AM  Between 7am to 7pm - Pager - [phone]  After 7pm go to [email or web address] - password [facility]  [facility]   Office  [phone]

## 2014-10-13 NOTE — Progress Notes (Signed)
Pt with fever of 102.2. PRN Tylenol given. Follow up temp 101.5. Thedore MinsSingh MD notified and made aware. Per MD, telephone orders for blood cultures X 2 and one time dose of 500 mg of Tylenol ordered as well. Will continue to monitor pt.

## 2014-10-13 NOTE — Progress Notes (Signed)
Patient ID: Leroy Chen, male   DOB: 1955-09-09, 59 y.o.   MRN: [id number]    Subjective: Pt feels awful this morning.  No better than admission.  Still with quite a bit of pain  Objective: Vital signs in last 24 hours: Temp:  [99.6 F (37.6 C)-100.8 F (38.2 C)] 99.7 F (37.6 C) (03/29 0527) Pulse Rate:  [78-102] 93 (03/29 0527) Resp:  [18-39] 24 (03/29 0527) BP: (117-155)/(48-81) 134/59 mmHg (03/29 0527) SpO2:  [88 %-100 %] 93 % (03/29 0527) Weight:  [112.674 kg (248 lb 6.4 oz)-113.399 kg (250 lb)] 113.1 kg (249 lb 5.4 oz) (03/29 0436) Last BM Date: 10/12/14  Intake/Output from previous day: 03/28 0701 - 03/29 0700 In: 2046.7 [I.V.:2046.7] Out: -  Intake/Output this shift: Total I/O In: 1028.3 [I.V.:428.3; IV Piggyback:600] Out: -   PE: Abd: soft, quite tender in LLQ focally, hypoactive BS, ND Heart: regular Lungs: CTAB  Lab Results:   Recent Labs  10/12/14 1128 10/13/14 0515  WBC 16.1* 15.7*  HGB 13.5 12.1*  HCT 40.6 36.9*  PLT 277 247   BMET  Recent Labs  10/12/14 1128 10/13/14 0515  NA 138 134*  K 4.4 3.7  CL 106 102  CO2 22 25  GLUCOSE 190* 188*  BUN 15 10  CREATININE 1.24 1.20  CALCIUM 9.1 8.3*   PT/INR  Recent Labs  10/13/14 0515  LABPROT 15.5*  INR 1.21   CMP     Component Value Date/Time   NA 134* 10/13/2014 0515   K 3.7 10/13/2014 0515   CL 102 10/13/2014 0515   CO2 25 10/13/2014 0515   GLUCOSE 188* 10/13/2014 0515   BUN 10 10/13/2014 0515   CREATININE 1.20 10/13/2014 0515   CALCIUM 8.3* 10/13/2014 0515   PROT 7.3 10/13/2014 0515   ALBUMIN 2.9* 10/13/2014 0515   AST 15 10/13/2014 0515   ALT 15 10/13/2014 0515   ALKPHOS 55 10/13/2014 0515   BILITOT 1.8* 10/13/2014 0515   GFRNONAA 65* 10/13/2014 0515   GFRAA 75* 10/13/2014 0515   Lipase  No results found for: LIPASE     Studies/Results: Ct Abdomen Pelvis W Contrast  10/12/2014   CLINICAL DATA:  59yom, lower abd pain, n/v/d, fever, and cysts on buttocks and in his  groin. Past medical history includes DM.  EXAM: CT ABDOMEN AND PELVIS WITH CONTRAST  TECHNIQUE: Multidetector CT imaging of the abdomen and pelvis was performed using the standard protocol following bolus administration of intravenous contrast.  CONTRAST:  OMNIPAQUE IOHEXOL 300 MG/ML  SOLN  COMPARISON:  03/24/2006  FINDINGS: There is a heterogeneous collection contiguous with the posterior margin of the mid sigmoid colon measuring 4 cm x 3.3 cm x 3.6 cm. Is contiguous with an area of inflammatory changes small extraluminal bubbles of air that tracks along the course of the central left iliac vessels. The contiguous sigmoid colon shows mild wall thickening and multiple diverticula. Hazy inflammatory changes seen in the adjacent fat. Findings are most consistent with a very diverticular abscess.  There are additional scattered left colon diverticula without other evidence of diverticulitis. Remainder of the colon is unremarkable. Normal appendix is visualized. Normal small bowel.  There are prominent inguinal and external iliac chain lymph nodes. Largest measure 1 cm short axis. No other adenopathy. No ascites.  Lung bases essentially clear. Liver shows diffuse fatty infiltration. No liver mass or focal lesion.  Spleen, gallbladder, pancreas, adrenal glands, kidneys, ureters and bladder: Unremarkable.  Mild degenerative changes noted of the visualized  spine. No osteoblastic or osteolytic lesions.  IMPRESSION: 1. Sigmoid colon diverticulitis with a very diverticular abscess measuring 4 cm in greatest dimension and a small amount of adjacent, contained, extraluminal air. 2. No other acute abnormalities. 3. Hepatic steatosis.   Electronically Signed   By: Amie Portlandavid  Ormond M.D.   On: 10/12/2014 17:15    Anti-infectives: Anti-infectives    Start     Dose/Rate Route Frequency Ordered Stop   10/13/14 0745  meropenem (MERREM) 1 g in sodium chloride 0.9 % 100 mL IVPB     1 g 200 mL/hr over 30 Minutes Intravenous 3  times per day 10/13/14 0739     10/13/14 0600  ciprofloxacin (CIPRO) IVPB 400 mg  Status:  Discontinued     400 mg 200 mL/hr over 60 Minutes Intravenous Every 12 hours 10/12/14 2052 10/13/14 0739   10/12/14 2130  doxycycline (VIBRAMYCIN) 100 mg in dextrose 5 % 250 mL IVPB  Status:  Discontinued     100 mg 125 mL/hr over 120 Minutes Intravenous Every 12 hours 10/12/14 2037 10/13/14 0909   10/12/14 2045  metroNIDAZOLE (FLAGYL) IVPB 500 mg  Status:  Discontinued     500 mg 100 mL/hr over 60 Minutes Intravenous Every 8 hours 10/12/14 2033 10/13/14 0739   10/12/14 1800  vancomycin (VANCOCIN) 2,000 mg in sodium chloride 0.9 % 500 mL IVPB     2,000 mg 250 mL/hr over 120 Minutes Intravenous  Once 10/12/14 1736 10/12/14 2040   10/12/14 1745  ciprofloxacin (CIPRO) IVPB 400 mg     400 mg 200 mL/hr over 60 Minutes Intravenous  Once 10/12/14 1736 10/12/14 1946   10/12/14 1745  metroNIDAZOLE (FLAGYL) IVPB 500 mg     500 mg 100 mL/hr over 60 Minutes Intravenous  Once 10/12/14 1736 10/12/14 1904       Assessment/Plan  1. Diverticulitis with abscess -I have spoken with IR.  Unfortunately, this abscess is not amenable to perc drainage.  We will continue to treat the patient conservatively with IV abx therapy and NPO status for right now -this is complicated diverticulitis so I have stopped his Cipro/Flagyl and placed him on Meropenem. -repeat labs in am 2.  Chronic draining wounds of perineum and pilonidal area -on doxy for this   LOS: 1 day    Rupinder Livingston E 10/13/2014, 9:25 AM Pager: [phone]

## 2014-10-13 NOTE — Progress Notes (Signed)
INITIAL NUTRITION ASSESSMENT  DOCUMENTATION CODES Per approved criteria  -Obesity Unspecified   INTERVENTION: -RD to follow for diet advancement -Supplement diet as appropriate  NUTRITION DIAGNOSIS: Inadequate oral intake related to inability to eat as evidenced by NPO.   Goal: Pt will meet >90% of estimated nutritional needs  Monitor:  Diet advancement, PO intake, labs, weight changes, I/O's  Reason for Assessment: MST=3  59 y.o. male  Admitting Dx: Colonic diverticular abscess  Leroy Chen is a 59 y.o. male with Past medical history of diabetes mellitus, history of hidradenitis, recurrent skin infections. The patient is presenting with complaints of left groin pain as well as multiple skin lesions. He mentions that he has noted swelling in his right groin as well as a few swellings on his anal area 2 weeks ago. There was no pain. Later on 3-4 days ago this swelling started draining.   ASSESSMENT: Pt asleep at time of visit. Nutriton-focused physical exam deferred. Chart reviewed. Wt hx reveals mild wt loss over the past years, however, not significant for time frame. Wt has been stable over the past month.  Surgery is following. Diverticular abscess is not amenable to perc drainage. Plan is to continue antibiotic therapy.  Labs reviewed. Na: 154, Calcium: 8.3, Glucose: 188, CBGS: 159-178.   Height: Ht Readings from Last 1 Encounters:  10/12/14  (1.88 m)    Weight: Wt Readings from Last 1 Encounters:  10/13/14 249 lb 5.4 oz (113.1 kg)    Ideal Body Weight: 190#  % Ideal Body Weight: 131%  Wt Readings from Last 10 Encounters:  10/13/14 249 lb 5.4 oz (113.1 kg)  09/24/13 249 lb (112.946 kg)  08/02/12 257 lb 9.6 oz (116.847 kg)    Usual Body Weight: 257#  % Usual Body Weight: 97%  BMI:  Body mass index is 32 kg/(m^2). Obesity, class I  Estimated Nutritional Needs: Kcal: 2200-2400 Protein: 120-130 grams Fluid: 2.2-2.4 L  Skin: Intact  Diet  Order: Diet NPO time specified Except for: Sips with Meds, Ice Chips  EDUCATION NEEDS: -Education not appropriate at this time   Intake/Output Summary (Last 24 hours) at 10/13/14 1405 Last data filed at 10/13/14 1000  Gross per 24 hour  Intake   3075 ml  Output      0 ml  Net   3075 ml    Last BM: 10/12/14  Labs:   Recent Labs Lab 10/12/14 1128 10/13/14 0515  NA 138 134*  K 4.4 3.7  CL 106 102  CO2 22 25  BUN 15 10  CREATININE 1.24 1.20  CALCIUM 9.1 8.3*  GLUCOSE 190* 188*    CBG (last 3)   Recent Labs  10/13/14 10/13/14 0517 10/13/14 1249  GLUCAP 176* 178* 159*    Scheduled Meds: . antiseptic oral rinse  7 mL Mouth Rinse BID  . aspirin  81 mg Oral Daily  . heparin  5,000 Units Subcutaneous 3 times per day  . insulin aspart  0-15 Units Subcutaneous 4 times per day  . meropenem (MERREM) IV  1 g Intravenous 3 times per day  . vancomycin  1,000 mg Intravenous STAT  . [START ON 10/14/2014] vancomycin  1,000 mg Intravenous Q12H    Continuous Infusions: . sodium chloride 75 mL/hr at 10/13/14 1308    Past Medical History  Diagnosis Date  . Diabetes mellitus without complication   . High cholesterol     Past Surgical History  Procedure Laterality Date  . Hand sugery    .  Orchiectomy      Leroy Chen, RD, LDN, CDE Pager: [phone] After hours Pager: [phone]

## 2014-10-13 NOTE — Progress Notes (Signed)
10/13/14 Spoke with patient about need for medication assistance, he stated that he is unable to afford Testim 1% which is $1000 per month. Checked online, no assistance programs or coupons available. Recommended to patient to speak to ordering MD about possiblity of ordering a different brand and gave him a list of other brands listed on [email or web address] which have coupons or assistance programs available and gave him information about [email or web address].CM will continue to follow patient for d/c needs.

## 2014-10-13 NOTE — Progress Notes (Signed)
ANTIBIOTIC CONSULT NOTE - INITIAL  Pharmacy Consult for Vancomyin Indication: Wound infection    Allergies  Allergen Reactions  . Penicillins Swelling    Patient Measurements: Height: 6\' 2"  (188 cm) Weight: 249 lb 5.4 oz (113.1 kg) IBW/kg (Calculated) : 82.2 Adjusted Body Weight:    Vital Signs: Temp: 99.7 F (37.6 C) (03/29 0527) Temp Source: Oral (03/29 0527) BP: 134/59 mmHg (03/29 0527) Pulse Rate: 93 (03/29 0527) Intake/Output from previous day: 03/28 0701 - 03/29 0700 In: 2046.7 [I.V.:2046.7] Out: -  Intake/Output from this shift: Total I/O In: 1028.3 [I.V.:428.3; IV Piggyback:600] Out: -   Labs:  Recent Labs  10/12/14 1128 10/13/14 0515  WBC 16.1* 15.7*  HGB 13.5 12.1*  PLT 277 247  CREATININE 1.24 1.20   Estimated Creatinine Clearance: 89.8 mL/min (by C-G formula based on Cr of 1.2). No results for input(s): VANCOTROUGH, VANCOPEAK, VANCORANDOM, GENTTROUGH, GENTPEAK, GENTRANDOM, TOBRATROUGH, TOBRAPEAK, TOBRARND, AMIKACINPEAK, AMIKACINTROU, AMIKACIN in the last 72 hours.   Microbiology: No results found for this or any previous visit (from the past 720 hour(s)).  Medical History: Past Medical History  Diagnosis Date  . Diabetes mellitus without complication   . High cholesterol     Medications:  Prescriptions prior to admission  Medication Sig Dispense Refill Last Dose  . aspirin 81 MG tablet Take 81 mg by mouth daily.   10/11/2014  . glimepiride (AMARYL) 4 MG tablet Take 4 mg by mouth daily before breakfast.    10/11/2014  . metFORMIN (GLUCOPHAGE) 500 MG tablet Take 500 mg by mouth 2 (two) times daily with a meal.   10/11/2014  . Multiple Vitamin (MULTIVITAMIN) tablet Take 1 tablet by mouth daily.   10/11/2014   Assessment:Multiple skin lesions, left groin pain 59 y/o M presents with c/o L groin pain (including anal area) with drainage and multiple skin lesions with h/o hydradenitis (inflamm of sweat glands) and recurrent skin infections. CT suggests  mdiverticular abscess as well as folliculitis. Initially started on Cipro/Flagyl/Doxy>>Merrem>>now IV Vancomycin.  ID: Diverticulitis with abscess. Chronic draining wounds of perineum and pilonidal area. Tmax 100.8. WBC 15.7 elevated. +significant pain. CrCl 89  Goal of Therapy:  Vancomycin trough level 10-15 mcg/ml  Plan:  Merrem 1g IV q8hr dose ok. Vancomycin 1g IV q12h. Trough at steady state after 3-5 doses.   Dayle Sherpa S. Merilynn Finlandobertson, PharmD, BCPS Clinical Staff Pharmacist Pager [phone]  Misty Stanleyobertson, [place] Cost Stillinger 10/13/2014,1:16 PM

## 2014-10-13 NOTE — Progress Notes (Signed)
Thedore MinsSingh MD aware of follow up temp 100.6 No further orders at this time.

## 2014-10-14 LAB — GLUCOSE, CAPILLARY
GLUCOSE-CAPILLARY: 147 mg/dL — AB (ref 70–99)
GLUCOSE-CAPILLARY: 130 mg/dL — AB (ref 70–99)
Glucose-Capillary: 143 mg/dL — ABNORMAL HIGH (ref 70–99)
Glucose-Capillary: 149 mg/dL — ABNORMAL HIGH (ref 70–99)
Glucose-Capillary: 155 mg/dL — ABNORMAL HIGH (ref 70–99)

## 2014-10-14 LAB — BASIC METABOLIC PANEL
ANION GAP: 8 (ref 5–15)
BUN: 9 mg/dL (ref 6–23)
CALCIUM: 8.4 mg/dL (ref 8.4–10.5)
CHLORIDE: 103 mmol/L (ref 96–112)
CO2: 24 mmol/L (ref 19–32)
Creatinine, Ser: 1.06 mg/dL (ref 0.50–1.35)
GFR calc Af Amer: 88 mL/min — ABNORMAL LOW (ref >90)
GFR calc non Af Amer: 76 mL/min — ABNORMAL LOW (ref >90)
Glucose, Bld: 171 mg/dL — ABNORMAL HIGH (ref 70–99)
Potassium: 3.6 mmol/L (ref 3.5–5.1)
Sodium: 135 mmol/L (ref 135–145)

## 2014-10-14 LAB — CBC
HEMATOCRIT: 37.1 % — AB (ref 39.0–52.0)
Hemoglobin: 12 g/dL — ABNORMAL LOW (ref 13.0–17.0)
MCH: 30 pg (ref 26.0–34.0)
MCHC: 32.3 g/dL (ref 30.0–36.0)
MCV: 92.8 fL (ref 78.0–100.0)
PLATELETS: 244 10*3/uL (ref 150–400)
RBC: 4 MIL/uL — ABNORMAL LOW (ref 4.22–5.81)
RDW: 12.6 % (ref 11.5–15.5)
WBC: 16.9 10*3/uL — ABNORMAL HIGH (ref 4.0–10.5)

## 2014-10-14 LAB — HEMOGLOBIN A1C
Hgb A1c MFr Bld: 8.5 % — ABNORMAL HIGH (ref 4.8–5.6)
Mean Plasma Glucose: 197 mg/dL

## 2014-10-14 MED ORDER — SODIUM CHLORIDE 0.9 % IV SOLN
INTRAVENOUS | Status: AC
  Administered 2014-10-14: 10:00:00 via INTRAVENOUS

## 2014-10-14 MED ORDER — SODIUM CHLORIDE 0.9 % IV SOLN
INTRAVENOUS | Status: DC
Start: 2014-10-14 — End: 2014-10-15
  Administered 2014-10-14 – 2014-10-15 (×2): via INTRAVENOUS

## 2014-10-14 NOTE — Progress Notes (Signed)
Subjective: Some diaphoresis but no shift girders. Still has left lower quadrant pain but says he's feeling better. Voiding without difficulty, states urine is clear. No bowel movement. No nausea or vomiting. Hasn't taken any pain medication for 8 hours. Slept some during the night.  Temp curve declining. Currently 100.1. No labs back yet this morning. CBGs controlled, 141-178 range.  On Merrem and vancomycin. Bowel rest. Complicated diverticulitis with 4 cm. pericolonic abscess, not amenable to percutaneous drainage.   We had a long talk about algorithms care. He understands that the current options are antibiotics and bowel rest versus surgical intervention which would necessitate resection and colostomy. He wants to give it another day and I think that is reasonable since he is somewhat better and has not deteriorated.  Objective: Vital signs in last 24 hours: Temp:  [100 F (37.8 C)-102.2 F (39 C)] 100.1 F (37.8 C) (03/30 0300) Pulse Rate:  [91-96] 91 (03/29 2037) Resp:  [20-22] 20 (03/29 2037) BP: (145-150)/(59-63) 145/59 mmHg (03/29 2037) SpO2:  [97 %-98 %] 97 % (03/29 2037) Last BM Date: 10/12/14  Intake/Output from previous day: 03/29 0701 - 03/30 0700 In: 1958.3 [I.V.:1058.3; IV Piggyback:900] Out: -  Intake/Output this shift:    General appearance: Alert. Cooperative. Oriented. Minimal distress. Resp: clear to auscultation bilaterally GI: Tender left suprapubic area. Not distended. Soft elsewhere. No mass.  Lab Results:   Recent Labs  10/12/14 1128 10/13/14 0515  WBC 16.1* 15.7*  HGB 13.5 12.1*  HCT 40.6 36.9*  PLT 277 247   BMET  Recent Labs  10/12/14 1128 10/13/14 0515  NA 138 134*  K 4.4 3.7  CL 106 102  CO2 22 25  GLUCOSE 190* 188*  BUN 15 10  CREATININE 1.24 1.20  CALCIUM 9.1 8.3*   PT/INR  Recent Labs  10/13/14 0515  LABPROT 15.5*  INR 1.21   ABG No results for input(s): PHART, HCO3 in the last 72 hours.  Invalid input(s):  PCO2, PO2  Studies/Results: Ct Abdomen Pelvis W Contrast  10/12/2014   CLINICAL DATA:  59yom, lower abd pain, n/v/d, fever, and cysts on buttocks and in his groin. Past medical history includes DM.  EXAM: CT ABDOMEN AND PELVIS WITH CONTRAST  TECHNIQUE: Multidetector CT imaging of the abdomen and pelvis was performed using the standard protocol following bolus administration of intravenous contrast.  CONTRAST:  100mL OMNIPAQUE IOHEXOL 300 MG/ML  SOLN  COMPARISON:  03/24/2006  FINDINGS: There is a heterogeneous collection contiguous with the posterior margin of the mid sigmoid colon measuring 4 cm x 3.3 cm x 3.6 cm. Is contiguous with an area of inflammatory changes small extraluminal bubbles of air that tracks along the course of the central left iliac vessels. The contiguous sigmoid colon shows mild wall thickening and multiple diverticula. Hazy inflammatory changes seen in the adjacent fat. Findings are most consistent with a very diverticular abscess.  There are additional scattered left colon diverticula without other evidence of diverticulitis. Remainder of the colon is unremarkable. Normal appendix is visualized. Normal small bowel.  There are prominent inguinal and external iliac chain lymph nodes. Largest measure 1 cm short axis. No other adenopathy. No ascites.  Lung bases essentially clear. Liver shows diffuse fatty infiltration. No liver mass or focal lesion.  Spleen, gallbladder, pancreas, adrenal glands, kidneys, ureters and bladder: Unremarkable.  Mild degenerative changes noted of the visualized spine. No osteoblastic or osteolytic lesions.  IMPRESSION: 1. Sigmoid colon diverticulitis with a very diverticular abscess measuring 4 cm in greatest  dimension and a small amount of adjacent, contained, extraluminal air. 2. No other acute abnormalities. 3. Hepatic steatosis.   Electronically Signed   By: Amie Portland M.D.   On: 10/12/2014 17:15    Anti-infectives: Anti-infectives    Start      Dose/Rate Route Frequency Ordered Stop   10/14/14 0200  vancomycin (VANCOCIN) IVPB 1000 mg/200 mL premix     1,000 mg 200 mL/hr over 60 Minutes Intravenous Every 12 hours 10/13/14 1323     10/13/14 1330  vancomycin (VANCOCIN) IVPB 1000 mg/200 mL premix     1,000 mg 200 mL/hr over 60 Minutes Intravenous STAT 10/13/14 1323 10/13/14 1504   10/13/14 0745  meropenem (MERREM) 1 g in sodium chloride 0.9 % 100 mL IVPB     1 g 200 mL/hr over 30 Minutes Intravenous 3 times per day 10/13/14 0739     10/13/14 0600  ciprofloxacin (CIPRO) IVPB 400 mg  Status:  Discontinued     400 mg 200 mL/hr over 60 Minutes Intravenous Every 12 hours 10/12/14 2052 10/13/14 0739   10/12/14 2130  doxycycline (VIBRAMYCIN) 100 mg in dextrose 5 % 250 mL IVPB  Status:  Discontinued     100 mg 125 mL/hr over 120 Minutes Intravenous Every 12 hours 10/12/14 2037 10/13/14 0909   10/12/14 2045  metroNIDAZOLE (FLAGYL) IVPB 500 mg  Status:  Discontinued     500 mg 100 mL/hr over 60 Minutes Intravenous Every 8 hours 10/12/14 2033 10/13/14 0739   10/12/14 1800  vancomycin (VANCOCIN) 2,000 mg in sodium chloride 0.9 % 500 mL IVPB     2,000 mg 250 mL/hr over 120 Minutes Intravenous  Once 10/12/14 1736 10/12/14 2040   10/12/14 1745  ciprofloxacin (CIPRO) IVPB 400 mg     400 mg 200 mL/hr over 60 Minutes Intravenous  Once 10/12/14 1736 10/12/14 1946   10/12/14 1745  metroNIDAZOLE (FLAGYL) IVPB 500 mg     500 mg 100 mL/hr over 60 Minutes Intravenous  Once 10/12/14 1736 10/12/14 1904      Assessment/Plan:  Complicated acute diverticulitis with 4 cm abscess. Stable but still symptomatic. Some clinical improvement. Continue IV antibiotics and bowel rest He may require colectomy and colostomy this admission, if he does not clearly improve  Chronic drains wounds of perineum of pilonidal area. On vancomycin.  DM - 2...on SSI     LOS: 2 days    Reinette Cuneo M 10/14/2014

## 2014-10-14 NOTE — Progress Notes (Signed)
Patient Demographics  Leroy Chen, is a 59 y.o. male, DOB - September 24, 1955, ZOX:[id number]  Admit date - 10/12/2014   Admitting Physician Leroy Salter, MD  Outpatient Primary MD for the patient is Leroy Mountain, MD  LOS - 2   Chief Complaint  Patient presents with  . Groin Pain        Subjective:   Leroy Chen today has, No headache, No chest pain, +ve lower abdominal pain - No Nausea, No new weakness tingling or numbness, No Cough - SOB.    Assessment & Plan    1. Colonic diverticular abscess. General surgery following, switched to meropenem per general surgery I agree. Per general surgery bowel rest, IV fluids, IV antibiotics. Gen. surgery following with likely need surgical intervention, pus not drainable by IR.   2. DM type II. For now ISS  Lab Results  Component Value Date   HGBA1C 8.5* 10/13/2014    CBG (last 3)   Recent Labs  10/14/14 0028 10/14/14 0544 10/14/14 0618  GLUCAP 149* 147* 155*     3. Mildly care. Due to dehydration resolved with hydration , continue IV fluids.    4. Pilinoidal sinus draining-  - CCS following, Added Vanco to Meropenam.      Code Status: full  Family Communication: none  Disposition Plan: Home   Procedures   CT abdomen pelvis   Consults  CCS-IR   Medications  Scheduled Meds: . antiseptic oral rinse  7 mL Mouth Rinse BID  . aspirin  81 mg Oral Daily  . heparin  5,000 Units Subcutaneous 3 times per day  . insulin aspart  0-15 Units Subcutaneous 4 times per day  . meropenem (MERREM) IV  1 g Intravenous 3 times per day  . vancomycin  1,000 mg Intravenous Q12H   Continuous Infusions: . sodium chloride 75 mL/hr at 10/14/14 0500   PRN Meds:.acetaminophen **OR** acetaminophen, morphine injection, [DISCONTINUED]  ondansetron **OR** ondansetron (ZOFRAN) IV  DVT Prophylaxis    Heparin -    Lab Results  Component Value Date   PLT 244 10/14/2014    Antibiotics     Anti-infectives    Start     Dose/Rate Route Frequency Ordered Stop   10/14/14 0200  vancomycin (VANCOCIN) IVPB 1000 mg/200 mL premix     1,000 mg 200 mL/hr over 60 Minutes Intravenous Every 12 hours 10/13/14 1323     10/13/14 1330  vancomycin (VANCOCIN) IVPB 1000 mg/200 mL premix     1,000 mg 200 mL/hr over 60 Minutes Intravenous STAT 10/13/14 1323 10/13/14 1504   10/13/14 0745  meropenem (MERREM) 1 g in sodium chloride 0.9 % 100 mL IVPB     1 g 200 mL/hr over 30 Minutes Intravenous 3 times per day 10/13/14 0739     10/13/14 0600  ciprofloxacin (CIPRO) IVPB 400 mg  Status:  Discontinued     400 mg 200 mL/hr over 60 Minutes Intravenous Every 12 hours 10/12/14 2052 10/13/14 0739   10/12/14 2130  doxycycline (VIBRAMYCIN) 100 mg in dextrose 5 % 250 mL IVPB  Status:  Discontinued     100 mg 125 mL/hr over 120 Minutes Intravenous Every 12 hours 10/12/14 2037 10/13/14 0909   10/12/14 2045  metroNIDAZOLE (FLAGYL) IVPB  500 mg  Status:  Discontinued     500 mg 100 mL/hr over 60 Minutes Intravenous Every 8 hours 10/12/14 2033 10/13/14 0739   10/12/14 1800  vancomycin (VANCOCIN) 2,000 mg in sodium chloride 0.9 % 500 mL IVPB     2,000 mg 250 mL/hr over 120 Minutes Intravenous  Once 10/12/14 1736 10/12/14 2040   10/12/14 1745  ciprofloxacin (CIPRO) IVPB 400 mg     400 mg 200 mL/hr over 60 Minutes Intravenous  Once 10/12/14 1736 10/12/14 1946   10/12/14 1745  metroNIDAZOLE (FLAGYL) IVPB 500 mg     500 mg 100 mL/hr over 60 Minutes Intravenous  Once 10/12/14 1736 10/12/14 1904          Objective:   Filed Vitals:   10/13/14 2200 10/14/14 0300 10/14/14 0530 10/14/14 0539  BP:    140/73  Pulse:    86  Temp: 100.2 F (37.9 C) 100.1 F (37.8 C)  99.6 F (37.6 C)  TempSrc: Oral Oral  Oral  Resp:    18  Height:      Weight:   109.1  kg (240 lb 8.4 oz)   SpO2:    99%    Wt Readings from Last 3 Encounters:  10/14/14 109.1 kg (240 lb 8.4 oz)  09/24/13 112.946 kg (249 lb)  08/02/12 116.847 kg (257 lb 9.6 oz)     Intake/Output Summary (Last 24 hours) at 10/14/14 0851 Last data filed at 10/13/14 1741  Gross per 24 hour  Intake    930 ml  Output      0 ml  Net    930 ml     Physical Exam  Awake Alert, Oriented X 3, No new F.N deficits, Normal affect Leroy Chen Supple Neck,No JVD, No cervical lymphadenopathy appriciated.  Symmetrical Chest wall movement, Good air movement bilaterally, CTAB RRR,No Gallops,Rubs or new Murmurs, No Parasternal Heave +ve B.Sounds, Abd Soft, lower abdominal tenderness, No organomegaly appriciated, No rebound - guarding or rigidity. No Cyanosis, Clubbing or edema, No new Rash or bruise      Data Review   Micro Results Recent Results (from the past 240 hour(s))  Blood culture (routine x 2)     Status: None (Preliminary result)   Collection Time: 10/12/14  5:46 PM  Result Value Ref Range Status   Specimen Description BLOOD ARM RIGHT  Final   Special Requests BOTTLES DRAWN AEROBIC AND ANAEROBIC 5CC  Final   Culture   Final           BLOOD CULTURE RECEIVED NO GROWTH TO DATE CULTURE WILL BE HELD FOR 5 DAYS BEFORE ISSUING A FINAL NEGATIVE REPORT Performed at [facility]    Report Status PENDING  Incomplete  Blood culture (routine x 2)     Status: None (Preliminary result)   Collection Time: 10/12/14  5:52 PM  Result Value Ref Range Status   Specimen Description BLOOD ARM LEFT  Final   Special Requests BOTTLES DRAWN AEROBIC AND ANAEROBIC 5CC  Final   Culture   Final           BLOOD CULTURE RECEIVED NO GROWTH TO DATE CULTURE WILL BE HELD FOR 5 DAYS BEFORE ISSUING A FINAL NEGATIVE REPORT Performed at [facility]    Report Status PENDING  Incomplete  Culture, blood (routine x 2)     Status: None (Preliminary result)   Collection Time: 10/13/14  5:35 PM  Result  Value Ref Range Status   Specimen Description BLOOD LEFT ARM  Final   Special Requests BOTTLES DRAWN AEROBIC AND ANAEROBIC 10 CC EACH  Final   Culture PENDING  Incomplete   Report Status PENDING  Incomplete    Radiology Reports Ct Abdomen Pelvis W Contrast  10/12/2014   CLINICAL DATA:  59yom, lower abd pain, n/v/d, fever, and cysts on buttocks and in his groin. Past medical history includes DM.  EXAM: CT ABDOMEN AND PELVIS WITH CONTRAST  TECHNIQUE: Multidetector CT imaging of the abdomen and pelvis was performed using the Chen protocol following bolus administration of intravenous contrast.  CONTRAST:  100mL OMNIPAQUE IOHEXOL 300 MG/ML  SOLN  COMPARISON:  03/24/2006  FINDINGS: There is a heterogeneous collection contiguous with the posterior margin of the mid sigmoid colon measuring 4 cm x 3.3 cm x 3.6 cm. Is contiguous with an area of inflammatory changes small extraluminal bubbles of air that tracks along the course of the central left iliac vessels. The contiguous sigmoid colon shows mild wall thickening and multiple diverticula. Hazy inflammatory changes seen in the adjacent fat. Findings are most consistent with a very diverticular abscess.  There are additional scattered left colon diverticula without other evidence of diverticulitis. Remainder of the colon is unremarkable. Normal appendix is visualized. Normal small bowel.  There are prominent inguinal and external iliac chain lymph nodes. Largest measure 1 cm short axis. No other adenopathy. No ascites.  Lung bases essentially clear. Liver shows diffuse fatty infiltration. No liver mass or focal lesion.  Spleen, gallbladder, pancreas, adrenal glands, kidneys, ureters and bladder: Unremarkable.  Mild degenerative changes noted of the visualized spine. No osteoblastic or osteolytic lesions.  IMPRESSION: 1. Sigmoid colon diverticulitis with a very diverticular abscess measuring 4 cm in greatest dimension and a small amount of adjacent, contained,  extraluminal air. 2. No other acute abnormalities. 3. Hepatic steatosis.   Electronically Signed   By: Amie Portlandavid  Ormond M.D.   On: 10/12/2014 17:15     CBC  Recent Labs Lab 10/12/14 1128 10/13/14 0515 10/14/14 0503  WBC 16.1* 15.7* 16.9*  HGB 13.5 12.1* 12.0*  HCT 40.6 36.9* 37.1*  PLT 277 247 244  MCV 93.5 93.2 92.8  MCH 31.1 30.6 30.0  MCHC 33.3 32.8 32.3  RDW 12.7 12.9 12.6  LYMPHSABS 2.4  --   --   MONOABS 1.6*  --   --   EOSABS 0.3  --   --   BASOSABS 0.0  --   --     Chemistries   Recent Labs Lab 10/12/14 1128 10/13/14 0515 10/14/14 0503  NA 138 134* 135  K 4.4 3.7 3.6  CL 106 102 103  CO2 22 25 24   GLUCOSE 190* 188* 171*  BUN 15 10 9   CREATININE 1.24 1.20 1.06  CALCIUM 9.1 8.3* 8.4  AST 16 15  --   ALT 17 15  --   ALKPHOS 56 55  --   BILITOT 0.8 1.8*  --    ------------------------------------------------------------------------------------------------------------------ estimated creatinine clearance is 99.9 mL/min (by C-G formula based on Cr of 1.06). ------------------------------------------------------------------------------------------------------------------  Recent Labs  10/13/14 0515  HGBA1C 8.5*   ------------------------------------------------------------------------------------------------------------------ No results for input(s): CHOL, HDL, LDLCALC, TRIG, CHOLHDL, LDLDIRECT in the last 72 hours. ------------------------------------------------------------------------------------------------------------------ No results for input(s): TSH, T4TOTAL, T3FREE, THYROIDAB in the last 72 hours.  Invalid input(s): FREET3 ------------------------------------------------------------------------------------------------------------------ No results for input(s): VITAMINB12, FOLATE, FERRITIN, TIBC, IRON, RETICCTPCT in the last 72 hours.  Coagulation profile  Recent Labs Lab 10/13/14 0515  INR 1.21    No results for input(s): DDIMER in  the last  72 hours.  Cardiac Enzymes No results for input(s): CKMB, TROPONINI, MYOGLOBIN in the last 168 hours.  Invalid input(s): CK ------------------------------------------------------------------------------------------------------------------ Invalid input(s): POCBNP     Time Spent in minutes   35   Marisha Renier K M.D on 10/14/2014 at 8:51 AM  Between 7am to 7pm - Pager - [phone]  After 7pm go to [email or web address] - password [email or web address]  [facility]   Office  [phone]

## 2014-10-14 NOTE — Progress Notes (Signed)
UR Completed.  [phone]  

## 2014-10-15 ENCOUNTER — Inpatient Hospital Stay (HOSPITAL_COMMUNITY): Payer: 59 | Admitting: Anesthesiology

## 2014-10-15 ENCOUNTER — Encounter (HOSPITAL_COMMUNITY)
Admission: EM | Disposition: A | Discharge status: Home Health | Payer: Self-pay | Source: Home / Self Care | Attending: Internal Medicine

## 2014-10-15 DIAGNOSIS — K5720 Diverticulitis of large intestine with perforation and abscess without bleeding: Secondary | ICD-10-CM | POA: Insufficient documentation

## 2014-10-15 HISTORY — PX: LAPAROTOMY: SHX154

## 2014-10-15 HISTORY — PX: PARTIAL COLECTOMY: SHX5273

## 2014-10-15 HISTORY — PX: COLECTOMY: SHX59

## 2014-10-15 LAB — BASIC METABOLIC PANEL
Anion gap: 8 (ref 5–15)
BUN: 10 mg/dL (ref 6–23)
CHLORIDE: 105 mmol/L (ref 96–112)
CO2: 23 mmol/L (ref 19–32)
Calcium: 8.1 mg/dL — ABNORMAL LOW (ref 8.4–10.5)
Creatinine, Ser: 0.98 mg/dL (ref 0.50–1.35)
GFR calc non Af Amer: 89 mL/min — ABNORMAL LOW (ref >90)
Glucose, Bld: 140 mg/dL — ABNORMAL HIGH (ref 70–99)
Potassium: 3.6 mmol/L (ref 3.5–5.1)
Sodium: 136 mmol/L (ref 135–145)

## 2014-10-15 LAB — GLUCOSE, CAPILLARY
Glucose-Capillary: 125 mg/dL — ABNORMAL HIGH (ref 70–99)
Glucose-Capillary: 137 mg/dL — ABNORMAL HIGH (ref 70–99)
Glucose-Capillary: 142 mg/dL — ABNORMAL HIGH (ref 70–99)
Glucose-Capillary: 154 mg/dL — ABNORMAL HIGH (ref 70–99)
Glucose-Capillary: 159 mg/dL — ABNORMAL HIGH (ref 70–99)
Glucose-Capillary: 196 mg/dL — ABNORMAL HIGH (ref 70–99)

## 2014-10-15 LAB — CBC
HCT: 33.4 % — ABNORMAL LOW (ref 39.0–52.0)
Hemoglobin: 10.4 g/dL — ABNORMAL LOW (ref 13.0–17.0)
MCH: 28.8 pg (ref 26.0–34.0)
MCHC: 31.1 g/dL (ref 30.0–36.0)
MCV: 92.5 fL (ref 78.0–100.0)
PLATELETS: 246 10*3/uL (ref 150–400)
RBC: 3.61 MIL/uL — ABNORMAL LOW (ref 4.22–5.81)
RDW: 12.7 % (ref 11.5–15.5)
WBC: 12.7 10*3/uL — ABNORMAL HIGH (ref 4.0–10.5)

## 2014-10-15 LAB — SURGICAL PCR SCREEN
MRSA, PCR: POSITIVE — AB
STAPHYLOCOCCUS AUREUS: POSITIVE — AB

## 2014-10-15 SURGERY — LAPAROTOMY, EXPLORATORY
Anesthesia: General | Site: Abdomen

## 2014-10-15 MED ORDER — GLYCOPYRROLATE 0.2 MG/ML IJ SOLN
INTRAMUSCULAR | Status: AC
  Filled 2014-10-15: qty 1

## 2014-10-15 MED ORDER — LACTATED RINGERS IV SOLN
INTRAVENOUS | Status: DC | PRN
  Administered 2014-10-15 (×2): via INTRAVENOUS

## 2014-10-15 MED ORDER — MIDAZOLAM HCL 5 MG/5ML IJ SOLN
INTRAMUSCULAR | Status: DC | PRN
  Administered 2014-10-15: 2 mg via INTRAVENOUS

## 2014-10-15 MED ORDER — ROCURONIUM BROMIDE 50 MG/5ML IV SOLN
INTRAVENOUS | Status: AC
  Filled 2014-10-15: qty 3

## 2014-10-15 MED ORDER — VECURONIUM BROMIDE 10 MG IV SOLR
INTRAVENOUS | Status: DC | PRN
  Administered 2014-10-15 (×3): 2 mg via INTRAVENOUS

## 2014-10-15 MED ORDER — NALOXONE HCL 0.4 MG/ML IJ SOLN: 0.4000 mg | INTRAMUSCULAR | Status: DC | PRN

## 2014-10-15 MED ORDER — MUPIROCIN 2 % EX OINT
1.0000 "application " | TOPICAL_OINTMENT | Freq: Two times a day (BID) | CUTANEOUS | Status: AC
  Administered 2014-10-15 – 2014-10-20 (×10): 1 via NASAL
  Filled 2014-10-15 (×2): qty 22

## 2014-10-15 MED ORDER — PROPOFOL 10 MG/ML IV BOLUS
INTRAVENOUS | Status: DC | PRN
  Administered 2014-10-15: 200 mg via INTRAVENOUS

## 2014-10-15 MED ORDER — HYDROMORPHONE BOLUS VIA INFUSION
1.0000 mg | Freq: Once | INTRAVENOUS | Status: AC
  Administered 2014-10-15: 1 mg via INTRAVENOUS
  Filled 2014-10-15: qty 1

## 2014-10-15 MED ORDER — ARTIFICIAL TEARS OP OINT
TOPICAL_OINTMENT | OPHTHALMIC | Status: DC | PRN
  Administered 2014-10-15: 1 via OPHTHALMIC

## 2014-10-15 MED ORDER — ONDANSETRON HCL 4 MG/2ML IJ SOLN
INTRAMUSCULAR | Status: DC | PRN
  Administered 2014-10-15: 4 mg via INTRAVENOUS

## 2014-10-15 MED ORDER — GLYCOPYRROLATE 0.2 MG/ML IJ SOLN
INTRAMUSCULAR | Status: AC
  Filled 2014-10-15: qty 3

## 2014-10-15 MED ORDER — ENOXAPARIN SODIUM 40 MG/0.4ML ~~LOC~~ SOLN
40.0000 mg | SUBCUTANEOUS | Status: DC
  Administered 2014-10-16: 40 mg via SUBCUTANEOUS
  Filled 2014-10-15 (×2): qty 0.4

## 2014-10-15 MED ORDER — PROMETHAZINE HCL 25 MG/ML IJ SOLN: 6.2500 mg | INTRAMUSCULAR | Status: DC | PRN

## 2014-10-15 MED ORDER — DIPHENHYDRAMINE HCL 12.5 MG/5ML PO ELIX
12.5000 mg | ORAL_SOLUTION | Freq: Four times a day (QID) | ORAL | Status: DC | PRN
  Filled 2014-10-15: qty 5

## 2014-10-15 MED ORDER — FENTANYL CITRATE 0.05 MG/ML IJ SOLN
INTRAMUSCULAR | Status: AC
  Filled 2014-10-15: qty 5

## 2014-10-15 MED ORDER — 0.9 % SODIUM CHLORIDE (POUR BTL) OPTIME
TOPICAL | Status: DC | PRN
  Administered 2014-10-15 (×3): 1000 mL

## 2014-10-15 MED ORDER — ARTIFICIAL TEARS OP OINT
TOPICAL_OINTMENT | OPHTHALMIC | Status: AC
  Filled 2014-10-15: qty 3.5

## 2014-10-15 MED ORDER — ONDANSETRON HCL 4 MG/2ML IJ SOLN: 4.0000 mg | Freq: Four times a day (QID) | INTRAMUSCULAR | Status: DC | PRN

## 2014-10-15 MED ORDER — SUCCINYLCHOLINE CHLORIDE 20 MG/ML IJ SOLN
INTRAMUSCULAR | Status: AC
  Filled 2014-10-15: qty 1

## 2014-10-15 MED ORDER — SODIUM CHLORIDE 0.9 % IJ SOLN: 9.0000 mL | INTRAMUSCULAR | Status: DC | PRN

## 2014-10-15 MED ORDER — ONDANSETRON HCL 4 MG PO TABS: 4.0000 mg | ORAL_TABLET | Freq: Four times a day (QID) | ORAL | Status: DC | PRN

## 2014-10-15 MED ORDER — NEOSTIGMINE METHYLSULFATE 10 MG/10ML IV SOLN
INTRAVENOUS | Status: DC | PRN
  Administered 2014-10-15: 4 mg via INTRAVENOUS

## 2014-10-15 MED ORDER — CHLORHEXIDINE GLUCONATE CLOTH 2 % EX PADS
6.0000 | MEDICATED_PAD | Freq: Every day | CUTANEOUS | Status: AC
  Administered 2014-10-16 – 2014-10-20 (×5): 6 via TOPICAL

## 2014-10-15 MED ORDER — GLYCOPYRROLATE 0.2 MG/ML IJ SOLN
INTRAMUSCULAR | Status: DC | PRN
  Administered 2014-10-15: 0.6 mg via INTRAVENOUS

## 2014-10-15 MED ORDER — PROPOFOL 10 MG/ML IV BOLUS
INTRAVENOUS | Status: AC
  Filled 2014-10-15: qty 20

## 2014-10-15 MED ORDER — LIDOCAINE HCL (CARDIAC) 20 MG/ML IV SOLN
INTRAVENOUS | Status: AC
  Filled 2014-10-15: qty 5

## 2014-10-15 MED ORDER — NEOSTIGMINE METHYLSULFATE 10 MG/10ML IV SOLN
INTRAVENOUS | Status: AC
  Filled 2014-10-15: qty 1

## 2014-10-15 MED ORDER — SUCCINYLCHOLINE CHLORIDE 20 MG/ML IJ SOLN
INTRAMUSCULAR | Status: DC | PRN
  Administered 2014-10-15: 160 mg via INTRAVENOUS

## 2014-10-15 MED ORDER — HYDROMORPHONE 0.3 MG/ML IV SOLN
INTRAVENOUS | Status: DC
  Administered 2014-10-15: 0.3 mg via INTRAVENOUS
  Administered 2014-10-15: 23:00:00 via INTRAVENOUS
  Administered 2014-10-15: 3.9 mg via INTRAVENOUS
  Administered 2014-10-15: 2.1 mg via INTRAVENOUS
  Administered 2014-10-16: 8.1 mg via INTRAVENOUS
  Administered 2014-10-16: 4.5 mg via INTRAVENOUS
  Administered 2014-10-16: 5.8 mg via INTRAVENOUS
  Administered 2014-10-16 (×2): via INTRAVENOUS
  Administered 2014-10-16: 5.1 mg via INTRAVENOUS
  Administered 2014-10-17: 1.8 mg via INTRAVENOUS
  Administered 2014-10-17: 3.36 mg via INTRAVENOUS
  Administered 2014-10-17: 22:00:00 via INTRAVENOUS
  Administered 2014-10-17: 1.8 mg via INTRAVENOUS
  Administered 2014-10-17: 1.5 mg via INTRAVENOUS
  Administered 2014-10-18 (×2): 2.1 mg via INTRAVENOUS
  Administered 2014-10-18: 1.5 mg via INTRAVENOUS
  Administered 2014-10-18: 22:00:00 via INTRAVENOUS
  Administered 2014-10-18: 3.3 mg via INTRAVENOUS
  Administered 2014-10-18: 2.7 mg via INTRAVENOUS
  Administered 2014-10-18: 11:00:00 via INTRAVENOUS
  Administered 2014-10-19: 3.6 mg via INTRAVENOUS
  Administered 2014-10-19: 3 mg via INTRAVENOUS
  Administered 2014-10-19: 1.5 mg via INTRAVENOUS
  Administered 2014-10-19: 2.1 mg via INTRAVENOUS
  Administered 2014-10-19: 22:00:00 via INTRAVENOUS
  Administered 2014-10-19: 5.1 mg via INTRAVENOUS
  Administered 2014-10-19: 10:00:00 via INTRAVENOUS
  Administered 2014-10-20: 0.3 mg via INTRAVENOUS
  Administered 2014-10-20: 0.9 mg via INTRAVENOUS
  Administered 2014-10-20: 3.9 mg via INTRAVENOUS
  Administered 2014-10-20: 1.8 mg via INTRAVENOUS
  Administered 2014-10-20: 12:00:00 via INTRAVENOUS
  Administered 2014-10-21: 1.2 mg via INTRAVENOUS
  Administered 2014-10-21: 2.4 mg via INTRAVENOUS
  Administered 2014-10-21: 0.3 mg via INTRAVENOUS
  Filled 2014-10-15 (×12): qty 25

## 2014-10-15 MED ORDER — ROCURONIUM BROMIDE 100 MG/10ML IV SOLN
INTRAVENOUS | Status: DC | PRN
  Administered 2014-10-15: 50 mg via INTRAVENOUS

## 2014-10-15 MED ORDER — MIDAZOLAM HCL 2 MG/2ML IJ SOLN
INTRAMUSCULAR | Status: AC
  Filled 2014-10-15: qty 2

## 2014-10-15 MED ORDER — LABETALOL HCL 5 MG/ML IV SOLN
INTRAVENOUS | Status: AC
  Filled 2014-10-15: qty 4

## 2014-10-15 MED ORDER — LIDOCAINE HCL (CARDIAC) 20 MG/ML IV SOLN
INTRAVENOUS | Status: DC | PRN
  Administered 2014-10-15: 100 mg via INTRAVENOUS

## 2014-10-15 MED ORDER — FENTANYL CITRATE 0.05 MG/ML IJ SOLN: 25.0000 ug | INTRAMUSCULAR | Status: DC | PRN

## 2014-10-15 MED ORDER — ONDANSETRON HCL 4 MG/2ML IJ SOLN
INTRAMUSCULAR | Status: AC
  Filled 2014-10-15: qty 2

## 2014-10-15 MED ORDER — HYDROMORPHONE HCL 1 MG/ML IJ SOLN
INTRAMUSCULAR | Status: AC
  Filled 2014-10-15: qty 1

## 2014-10-15 MED ORDER — HYDROMORPHONE HCL 1 MG/ML IJ SOLN
0.2500 mg | INTRAMUSCULAR | Status: DC | PRN
  Administered 2014-10-15 – 2014-10-17 (×3): 0.5 mg via INTRAVENOUS
  Filled 2014-10-15: qty 1

## 2014-10-15 MED ORDER — FENTANYL CITRATE 0.05 MG/ML IJ SOLN
INTRAMUSCULAR | Status: DC | PRN
  Administered 2014-10-15 (×5): 100 ug via INTRAVENOUS
  Administered 2014-10-15 (×2): 50 ug via INTRAVENOUS

## 2014-10-15 MED ORDER — HYDROMORPHONE 0.3 MG/ML IV SOLN
INTRAVENOUS | Status: AC
  Administered 2014-10-15: 0.3 mg via INTRAVENOUS
  Filled 2014-10-15: qty 25

## 2014-10-15 MED ORDER — LACTATED RINGERS IV SOLN
INTRAVENOUS | Status: DC
  Administered 2014-10-15: 13:00:00 via INTRAVENOUS

## 2014-10-15 MED ORDER — OXYCODONE-ACETAMINOPHEN 5-325 MG PO TABS
1.0000 | ORAL_TABLET | ORAL | Status: DC | PRN
  Administered 2014-10-16 – 2014-10-24 (×12): 2 via ORAL
  Filled 2014-10-15 (×12): qty 2

## 2014-10-15 MED ORDER — DIPHENHYDRAMINE HCL 50 MG/ML IJ SOLN: 12.5000 mg | Freq: Four times a day (QID) | INTRAMUSCULAR | Status: DC | PRN

## 2014-10-15 MED ORDER — LABETALOL HCL 5 MG/ML IV SOLN
INTRAVENOUS | Status: DC | PRN
  Administered 2014-10-15 (×2): 5 mg via INTRAVENOUS

## 2014-10-15 MED ORDER — MEPERIDINE HCL 25 MG/ML IJ SOLN: 6.2500 mg | INTRAMUSCULAR | Status: DC | PRN

## 2014-10-15 MED ORDER — VECURONIUM BROMIDE 10 MG IV SOLR
INTRAVENOUS | Status: AC
  Filled 2014-10-15: qty 20

## 2014-10-15 MED ORDER — POTASSIUM CHLORIDE IN NACL 20-0.9 MEQ/L-% IV SOLN
INTRAVENOUS | Status: DC
  Administered 2014-10-15 – 2014-10-20 (×9): via INTRAVENOUS
  Administered 2014-10-21: 100 mL/h via INTRAVENOUS
  Administered 2014-10-21 – 2014-10-22 (×2): via INTRAVENOUS
  Filled 2014-10-15 (×24): qty 1000

## 2014-10-15 MED ORDER — ROCURONIUM BROMIDE 50 MG/5ML IV SOLN
INTRAVENOUS | Status: AC
  Filled 2014-10-15: qty 1

## 2014-10-15 SURGICAL SUPPLY — 56 items
BLADE SURG ROTATE 9660 (MISCELLANEOUS) ×2 IMPLANT
BNDG GAUZE ELAST 4 BULKY (GAUZE/BANDAGES/DRESSINGS) ×2 IMPLANT
CANISTER SUCTION 2500CC (MISCELLANEOUS) ×3 IMPLANT
CHLORAPREP W/TINT 26ML (MISCELLANEOUS) ×3 IMPLANT
COVER SURGICAL LIGHT HANDLE (MISCELLANEOUS) ×3 IMPLANT
DRAPE LAPAROSCOPIC ABDOMINAL (DRAPES) ×3 IMPLANT
DRAPE WARM FLUID 44X44 (DRAPE) ×3 IMPLANT
DRSG PAD ABDOMINAL 8X10 ST (GAUZE/BANDAGES/DRESSINGS) ×4 IMPLANT
ELECT BLADE 6.5 EXT (BLADE) ×3 IMPLANT
ELECT CAUTERY BLADE 6.4 (BLADE) ×6 IMPLANT
ELECT REM PT RETURN 9FT ADLT (ELECTROSURGICAL) ×3
ELECTRODE REM PT RTRN 9FT ADLT (ELECTROSURGICAL) ×1 IMPLANT
GAUZE SPONGE 4X4 12PLY STRL (GAUZE/BANDAGES/DRESSINGS) ×2 IMPLANT
GLOVE BIOGEL PI IND STRL 7.0 (GLOVE) IMPLANT
GLOVE BIOGEL PI INDICATOR 7.0 (GLOVE) ×2
GLOVE EUDERMIC 7 POWDERFREE (GLOVE) ×6 IMPLANT
GLOVE SURG SS PI 7.0 STRL IVOR (GLOVE) ×3 IMPLANT
GOWN STRL REUS W/ TWL LRG LVL3 (GOWN DISPOSABLE) ×4 IMPLANT
GOWN STRL REUS W/ TWL XL LVL3 (GOWN DISPOSABLE) ×2 IMPLANT
GOWN STRL REUS W/TWL LRG LVL3 (GOWN DISPOSABLE) ×9
GOWN STRL REUS W/TWL XL LVL3 (GOWN DISPOSABLE) ×6
KIT BASIN OR (CUSTOM PROCEDURE TRAY) ×3 IMPLANT
KIT OSTOMY DRAINABLE 2.75 STR (WOUND CARE) ×2 IMPLANT
KIT ROOM TURNOVER OR (KITS) ×3 IMPLANT
LIGASURE IMPACT 36 18CM CVD LR (INSTRUMENTS) ×3 IMPLANT
NS IRRIG 1000ML POUR BTL (IV SOLUTION) ×6 IMPLANT
PACK GENERAL/GYN (CUSTOM PROCEDURE TRAY) ×3 IMPLANT
PAD ARMBOARD 7.5X6 YLW CONV (MISCELLANEOUS) ×4 IMPLANT
PENCIL BUTTON HOLSTER BLD 10FT (ELECTRODE) ×3 IMPLANT
RELOAD PROXIMATE 75MM BLUE (ENDOMECHANICALS) ×3 IMPLANT
RELOAD STAPLE 75 3.8 BLU REG (ENDOMECHANICALS) IMPLANT
SPECIMEN JAR X LARGE (MISCELLANEOUS) ×3 IMPLANT
SPONGE LAP 18X18 X RAY DECT (DISPOSABLE) ×6 IMPLANT
STAPLER CUT CVD 40MM GREEN (STAPLE) ×2 IMPLANT
STAPLER PROXIMATE 75MM BLUE (STAPLE) ×2 IMPLANT
STAPLER VISISTAT 35W (STAPLE) ×3 IMPLANT
SUCTION POOLE TIP (SUCTIONS) ×3 IMPLANT
SUT ETHILON 2 0 FS 18 (SUTURE) ×2 IMPLANT
SUT PDS AB 1 CT  36 (SUTURE)
SUT PDS AB 1 CT 36 (SUTURE) IMPLANT
SUT PDS AB 1 TP1 96 (SUTURE) ×6 IMPLANT
SUT PROLENE 2 0 CT2 30 (SUTURE) ×2 IMPLANT
SUT SILK 2 0 SH CR/8 (SUTURE) ×3 IMPLANT
SUT SILK 2 0 TIES 10X30 (SUTURE) ×3 IMPLANT
SUT SILK 3 0 SH CR/8 (SUTURE) ×3 IMPLANT
SUT SILK 3 0 TIES 10X30 (SUTURE) ×3 IMPLANT
SUT VIC AB 0 CT1 27 (SUTURE) ×9
SUT VIC AB 0 CT1 27XBRD ANBCTR (SUTURE) IMPLANT
SUT VIC AB 3-0 SH 18 (SUTURE) ×2 IMPLANT
SYR BULB IRRIGATION 50ML (SYRINGE) ×3 IMPLANT
TAPE CLOTH SURG 4X10 WHT LF (GAUZE/BANDAGES/DRESSINGS) ×2 IMPLANT
TOWEL OR 17X24 6PK STRL BLUE (TOWEL DISPOSABLE) ×3 IMPLANT
TOWEL OR 17X26 10 PK STRL BLUE (TOWEL DISPOSABLE) ×6 IMPLANT
TUBE CONNECTING 12'X1/4 (SUCTIONS) ×1
TUBE CONNECTING 12X1/4 (SUCTIONS) ×2 IMPLANT
YANKAUER SUCT BULB TIP NO VENT (SUCTIONS) ×3 IMPLANT

## 2014-10-15 NOTE — Anesthesia Preprocedure Evaluation (Addendum)
Anesthesia Evaluation  Patient identified by MRN, date of birth, ID band Patient awake    Reviewed: Allergy & Precautions, NPO status , Patient's Chart, lab work & pertinent test results  Airway Mallampati: II   Neck ROM: Full    Dental  (+) Teeth Intact, Dental Advisory Given   Pulmonary Current Smoker,  breath sounds clear to auscultation        Cardiovascular Rhythm:Regular  AF in 09/2013   Neuro/Psych    GI/Hepatic Diverticulitis   Endo/Other  diabetes, Type 2, Insulin Dependent  Renal/GU Creat .98     Musculoskeletal   Abdominal (+)  Abdomen: soft.    Peds  Hematology 10/33   Anesthesia Other Findings   Reproductive/Obstetrics                            Anesthesia Physical Anesthesia Plan  ASA: III  Anesthesia Plan: General   Post-op Pain Management:    Induction: Intravenous and Rapid sequence  Airway Management Planned: Oral ETT  Additional Equipment:   Intra-op Plan:   Post-operative Plan: Extubation in OR  Informed Consent: I have reviewed the patients History and Physical, chart, labs and discussed the procedure including the risks, benefits and alternatives for the proposed anesthesia with the patient or authorized representative who has indicated his/her understanding and acceptance.     Plan Discussed with:   Anesthesia Plan Comments:         Anesthesia Quick Evaluation

## 2014-10-15 NOTE — Anesthesia Postprocedure Evaluation (Signed)
Anesthesia Post Note  Patient: Leroy Chen  Procedure(s) Performed: Procedure(s) (LRB): EXPLORATORY LAPAROTOMY WITH DRAINAGE OF INTRA- ABDOMINAL ABSCESS (N/A) SIGMOID COLECTOMY WITH COLOSTOMY CREATION (N/A)  Anesthesia type: general  Patient location: PACU  Post pain: Pain level controlled  Post assessment: Patient's Cardiovascular Status Stable  Last Vitals:  Filed Vitals:   10/15/14 1725  BP: 163/62  Pulse: 92  Temp: 37.1 C  Resp: 32    Post vital signs: Reviewed and stable  Level of consciousness: sedated  Complications: No apparent anesthesia complications

## 2014-10-15 NOTE — Progress Notes (Signed)
[facility] Progress Note     Subjective: Pt is doing some better, but still quite tender in his LLQ.  No N/V.  Tolerating ice only.  No flatus or BM.  NPO.  Wife at bedside, they are concerned he will likely need to proceed with surgical interventions.  Objective: Vital signs in last 24 hours: Temp:  [98.9 F (37.2 C)-100.5 F (38.1 C)] 99.5 F (37.5 C) (03/31 0546) Pulse Rate:  [87-92] 87 (03/31 0546) Resp:  [16-18] 18 (03/31 0546) BP: (128-147)/(47-68) 143/68 mmHg (03/31 0546) SpO2:  [96 %-100 %] 96 % (03/31 0546) Weight:  [109.5 kg (241 lb 6.5 oz)] 109.5 kg (241 lb 6.5 oz) (03/31 0546) Last BM Date: 10/11/14  Intake/Output from previous day: 03/30 0701 - 03/31 0700 In: 806.7 [I.V.:106.7; IV Piggyback:700] Out: 400 [Urine:400] Intake/Output this shift:    PE: Gen:  Alert, NAD, pleasant Abd: Soft, distended, tender in LLQ and suprapubic, +BS, no HSM   Lab Results:   Recent Labs  10/14/14 0503 10/15/14 0645  WBC 16.9* 12.7*  HGB 12.0* 10.4*  HCT 37.1* 33.4*  PLT 244 246   BMET  Recent Labs  10/14/14 0503 10/15/14 0645  NA 135 136  K 3.6 3.6  CL 103 105  CO2 24 23  GLUCOSE 171* 140*  BUN 9 10  CREATININE 1.06 0.98  CALCIUM 8.4 8.1*   PT/INR  Recent Labs  10/13/14 0515  LABPROT 15.5*  INR 1.21   CMP     Component Value Date/Time   NA 136 10/15/2014 0645   K 3.6 10/15/2014 0645   CL 105 10/15/2014 0645   CO2 23 10/15/2014 0645   GLUCOSE 140* 10/15/2014 0645   BUN 10 10/15/2014 0645   CREATININE 0.98 10/15/2014 0645   CALCIUM 8.1* 10/15/2014 0645   PROT 7.3 10/13/2014 0515   ALBUMIN 2.9* 10/13/2014 0515   AST 15 10/13/2014 0515   ALT 15 10/13/2014 0515   ALKPHOS 55 10/13/2014 0515   BILITOT 1.8* 10/13/2014 0515   GFRNONAA 89* 10/15/2014 0645   GFRAA >90 10/15/2014 0645   Lipase  No results found for: LIPASE     Studies/Results: No results found.  Anti-infectives: Anti-infectives    Start     Dose/Rate Route  Frequency Ordered Stop   10/14/14 0200  vancomycin (VANCOCIN) IVPB 1000 mg/200 mL premix     1,000 mg 200 mL/hr over 60 Minutes Intravenous Every 12 hours 10/13/14 1323     10/13/14 1330  vancomycin (VANCOCIN) IVPB 1000 mg/200 mL premix     1,000 mg 200 mL/hr over 60 Minutes Intravenous STAT 10/13/14 1323 10/13/14 1504   10/13/14 0745  meropenem (MERREM) 1 g in sodium chloride 0.9 % 100 mL IVPB     1 g 200 mL/hr over 30 Minutes Intravenous 3 times per day 10/13/14 0739     10/13/14 0600  ciprofloxacin (CIPRO) IVPB 400 mg  Status:  Discontinued     400 mg 200 mL/hr over 60 Minutes Intravenous Every 12 hours 10/12/14 2052 10/13/14 0739   10/12/14 2130  doxycycline (VIBRAMYCIN) 100 mg in dextrose 5 % 250 mL IVPB  Status:  Discontinued     100 mg 125 mL/hr over 120 Minutes Intravenous Every 12 hours 10/12/14 2037 10/13/14 0909   10/12/14 2045  metroNIDAZOLE (FLAGYL) IVPB 500 mg  Status:  Discontinued     500 mg 100 mL/hr over 60 Minutes Intravenous Every 8 hours 10/12/14 2033 10/13/14 0739   10/12/14 1800  vancomycin (VANCOCIN)  2,000 mg in sodium chloride 0.9 % 500 mL IVPB     2,000 mg 250 mL/hr over 120 Minutes Intravenous  Once 10/12/14 1736 10/12/14 2040   10/12/14 1745  ciprofloxacin (CIPRO) IVPB 400 mg     400 mg 200 mL/hr over 60 Minutes Intravenous  Once 10/12/14 1736 10/12/14 1946   10/12/14 1745  metroNIDAZOLE (FLAGYL) IVPB 500 mg     500 mg 100 mL/hr over 60 Minutes Intravenous  Once 10/12/14 1736 10/12/14 1904       Assessment/Plan Complicated acute diverticulitis with 4 cm abscess. -Stable but still symptomatic. WBC 12.7. -Continue IV antibiotics and bowel rest -Since no significant improvement will require colectomy and colostomy, will proceed today -On Meropenem Day #3 -Ambulate and IS -SCD's and heparin SQ at 6am, now on hold, got aspirin this am Leukocytosis -improved to 12.7 today Chronic drains wounds of perineum of pilonidal area -Was on doxy for this, now on  vancomycin Day #2    LOS: 3 days    DORT, Bettyjane Shenoy 10/15/2014, 9:54 AM Pager: [phone]

## 2014-10-15 NOTE — Transfer of Care (Signed)
Immediate Anesthesia Transfer of Care Note  Patient: Leroy Chen  Procedure(s) Performed: Procedure(s): EXPLORATORY LAPAROTOMY WITH DRAINAGE OF INTRA- ABDOMINAL ABSCESS (N/A) SIGMOID COLECTOMY WITH COLOSTOMY CREATION (N/A)  Patient Location: PACU  Anesthesia Type:General  Level of Consciousness: awake and patient cooperative  Airway & Oxygen Therapy: Patient Spontanous Breathing and Patient connected to nasal cannula oxygen, 7.5 NPA in R nare.  Post-op Assessment: Report given to RN, Post -op Vital signs reviewed and stable and Patient moving all extremities  Post vital signs: Reviewed and stable  Last Vitals:  Filed Vitals:   10/15/14 1300  BP: 155/67  Pulse: 85  Temp: 37.2 C  Resp: 18    Complications: No apparent anesthesia complications

## 2014-10-15 NOTE — Anesthesia Procedure Notes (Signed)
Procedure Name: Intubation Date/Time: 10/15/2014 1:43 PM Performed by: Roney MansSMITH, Embry Huss P Pre-anesthesia Checklist: Patient identified, Timeout performed, Emergency Drugs available, Suction available and Patient being monitored Patient Re-evaluated:Patient Re-evaluated prior to inductionOxygen Delivery Method: Circle system utilized Preoxygenation: Pre-oxygenation with 100% oxygen Intubation Type: IV induction, Rapid sequence and Cricoid Pressure applied Laryngoscope Size: Mac and 4 Grade View: Grade I Tube type: Oral Tube size: 7.5 mm Number of attempts: 1 Airway Equipment and Method: Stylet Placement Confirmation: ETT inserted through vocal cords under direct vision,  breath sounds checked- equal and bilateral and positive ETCO2 Secured at: 23 cm Tube secured with: Tape Dental Injury: Teeth and Oropharynx as per pre-operative assessment

## 2014-10-15 NOTE — Op Note (Signed)
Patient Name:           Leroy Leroy   Date of Surgery:        10/15/2014  Pre op Diagnosis:      Complicated acute diverticulitis with 4 cm abscess  Post op Diagnosis:    Same  Procedure:                 Exploratory laparotomy, sigmoid colectomy, end colostomy, drainage of retroperitoneal abscess  Surgeon:                     Angelia Mould. Derrell Lolling, M.D., FACS  Assistant:                      Violeta Gelinas, M.D., FACS  Operative Indications:   This is a 59 year old African-American man with a past history of diabetes, history of hidradenitis, history of pilonidal infection. He was recently admitted with abdominal pain and CT scan showed acute diverticulitis with a 4 cm abscess. He was started on antibiotics and stabilized but continued to have pain and leukocytosis. Interventional radiology was consulted for drainage and they were unable to find a safe window and so no attempt at percutaneous drainage was performed. Because of persistent pain and tenderness and leukocytosis the patient was advised to undergo a Hartmann resection. After discussing this yesterday and today he decided that he wanted to go ahead with this. He was willing to have a temporary colostomy.  Operative Findings:       There was a hard inflammatory mass of the distal sigmoid colon and very proximal rectum. There was an abscess in the retroperitoneum which was drained. The mid rectum was soft. The descending colon felt normal. The small bowel felt normal. The cecum and appendix looks normal. The abscess was walled off and there was no peritonitis.  I stapled off the rectum at least 6 cm above the peritoneal reflection and marked it with Prolene sutures.  Procedure in Detail:          Following the induction of general endotracheal anesthesia a Foley catheter was placed and the patient's abdomen was prepped and draped in sterile fashion. Intravenous antibiotics were given. Surgical timeout was performed. Midline laparotomy  incision was made from the lower midline and around the right side of the umbilicus and just a little bit above. The fascia was incised in the midline and the abdominal cavity entered under direct vision the exploration was carried out with findings as described above. Self-retaining retractor was placed. We packed off some of the small bowel. I started mobilizing the descending colon and sigmoid colon by dividing the lateral peritoneal attachments sharply. As I went more distally I used a combination of finger fracture and sharp dissection to mobilize the colon out of the retroperitoneum. I incised the mesentery medially. I chose a nice soft area of mid sigmoid and made a window in the mesentery and divided the colon with the GIA stapler. The mesentery was taken down with the LigaSure device. I had to suture ligate some of the vessels in the retroperitoneum but ultimately had excellent hemostasis. I continued the mesenteric dissection down to the upper rectum where the colon became very soft again. I divided the upper rectum with the contour stapling device and removed the specimen, marking the proximal margin with a silk suture. I marked the rectal stump with 2 Prolene sutures. I copiously irrigated out the lower abdomen and the pelvis and all the  retroperitoneum. I made sure that I had excellent hemostasis. I further mobilized the descending colon by dividing its lateral peritoneal attachments. Continued this all the almost all the way up to the splenic flexure and I had enough length. I decided to put the colostomy on the left side lateral to the umbilicus. A circular button of skin was removed and the subcutaneous tissue was debrided. Anterior rectus sheath was incised in a cruciate fashion. The rectus muscle was spread apart and the posterior rectus sheath was incised with cautery. I dilated the colostomy tract until it was 2-1/2 fingers wide. Using a  Babcock clamp I drew the colon out through the colostomy  wound being careful not to twist the colon. The colon looked viable.    I irrigated out the abdomen and pelvis. I returned the small bowel and omentum to their anatomic positions. Counts were correct at this point. Midline fascia was closed with a running suture of #1 double stated PDS and the midline was packed open with saline moistened Kerlix.     I matured the colostomy by amputating the  staple line and maturing it to the skin with 10 or 12 interrupted sutures of 3-0 Vicryl. The colon looked pink and bled fairly well. I was able to pass my finger down the colon without any obstruction. A colostomy bag was placed. The patient tolerated the procedure well was taken to PACU in stable condition. EBL 300 mL. Counts correct. Complications none.     Angelia MouldHaywood M. Derrell LollingIngram, M.D., FACS General and Minimally Invasive Surgery Breast and Colorectal Surgery  10/15/2014 3:29 PM

## 2014-10-15 NOTE — Progress Notes (Signed)
Patient Demographics  Leroy Chen, is a 59 y.o. male, DOB - June 24, 1956, ZOX:[id number]  Admit date - 10/12/2014   Admitting Physician Leroy Salter, MD  Outpatient Primary MD for the patient is Leroy Mountain, MD  LOS - 3   Chief Complaint  Patient presents with  . Groin Pain        Subjective:   Leroy Chen today has, No headache, No chest pain, +ve lower abdominal pain - No Nausea, No new weakness tingling or numbness, No Cough - SOB.    Assessment & Plan    1. Colonic diverticular abscess. General surgery following, switched to meropenem per general surgery I agree. Per general surgery bowel rest, IV fluids, IV antibiotics. Gen. surgery following, pus not drainable by IR. Overall mildly improved on 10/15/2014 with reduced leukocytosis and pain.   2. DM type II. For now ISS  Lab Results  Component Value Date   HGBA1C 8.5* 10/13/2014    CBG (last 3)   Recent Labs  10/14/14 1813 10/15/14 0043 10/15/14 0544  GLUCAP 143* 142* 154*     3. Mildly ARF . Due to dehydration resolved with hydration , continue IV fluids.    4. Pilinoidal sinus draining-  - CCS following, Added Vanco to Meropenam.      Code Status: full  Family Communication: none  Disposition Plan: Home   Procedures   CT abdomen pelvis   Consults  CCS-IR   Medications  Scheduled Meds: . antiseptic oral rinse  7 mL Mouth Rinse BID  . aspirin  81 mg Oral Daily  . heparin  5,000 Units Subcutaneous 3 times per day  . insulin aspart  0-15 Units Subcutaneous 4 times per day  . meropenem (MERREM) IV  1 g Intravenous 3 times per day  . vancomycin  1,000 mg Intravenous Q12H   Continuous Infusions: . sodium chloride 100 mL/hr at 10/15/14 0444   PRN Meds:.acetaminophen **OR** acetaminophen,  morphine injection, [DISCONTINUED] ondansetron **OR** ondansetron (ZOFRAN) IV  DVT Prophylaxis    Heparin -    Lab Results  Component Value Date   PLT 246 10/15/2014    Antibiotics     Anti-infectives    Start     Dose/Rate Route Frequency Ordered Stop   10/14/14 0200  vancomycin (VANCOCIN) IVPB 1000 mg/200 mL premix     1,000 mg 200 mL/hr over 60 Minutes Intravenous Every 12 hours 10/13/14 1323     10/13/14 1330  vancomycin (VANCOCIN) IVPB 1000 mg/200 mL premix     1,000 mg 200 mL/hr over 60 Minutes Intravenous STAT 10/13/14 1323 10/13/14 1504   10/13/14 0745  meropenem (MERREM) 1 g in sodium chloride 0.9 % 100 mL IVPB     1 g 200 mL/hr over 30 Minutes Intravenous 3 times per day 10/13/14 0739     10/13/14 0600  ciprofloxacin (CIPRO) IVPB 400 mg  Status:  Discontinued     400 mg 200 mL/hr over 60 Minutes Intravenous Every 12 hours 10/12/14 2052 10/13/14 0739   10/12/14 2130  doxycycline (VIBRAMYCIN) 100 mg in dextrose 5 % 250 mL IVPB  Status:  Discontinued     100 mg 125 mL/hr over 120 Minutes Intravenous Every 12 hours 10/12/14 2037 10/13/14 0909  10/12/14 2045  metroNIDAZOLE (FLAGYL) IVPB 500 mg  Status:  Discontinued     500 mg 100 mL/hr over 60 Minutes Intravenous Every 8 hours 10/12/14 2033 10/13/14 0739   10/12/14 1800  vancomycin (VANCOCIN) 2,000 mg in sodium chloride 0.9 % 500 mL IVPB     2,000 mg 250 mL/hr over 120 Minutes Intravenous  Once 10/12/14 1736 10/12/14 2040   10/12/14 1745  ciprofloxacin (CIPRO) IVPB 400 mg     400 mg 200 mL/hr over 60 Minutes Intravenous  Once 10/12/14 1736 10/12/14 1946   10/12/14 1745  metroNIDAZOLE (FLAGYL) IVPB 500 mg     500 mg 100 mL/hr over 60 Minutes Intravenous  Once 10/12/14 1736 10/12/14 1904          Objective:   Filed Vitals:   10/14/14 1439 10/14/14 2205 10/14/14 2300 10/15/14 0546  BP: 128/47 147/67  143/68  Pulse:  92  87  Temp: 98.9 F (37.2 C) 100.5 F (38.1 C) 99.6 F (37.6 C) 99.5 F (37.5 C)   TempSrc: Oral Oral Oral Oral  Resp: Height:      Weight:    109.5 kg (241 lb 6.5 oz)  SpO2: 100% 96%  96%    Wt Readings from Last 3 Encounters:  10/15/14 109.5 kg (241 lb 6.5 oz)  09/24/13 112.946 kg (249 lb)  08/02/12 116.847 kg (257 lb 9.6 oz)     Intake/Output Summary (Last 24 hours) at 10/15/14 0856 Last data filed at 10/15/14 0600  Gross per 24 hour  Intake 806.67 ml  Output    400 ml  Net 406.67 ml     Physical Exam  Awake Alert, Oriented X 3, No new F.N deficits, Normal affect Leroy Chen.AT,PERRAL Supple Neck,No JVD, No cervical lymphadenopathy appriciated.  Symmetrical Chest wall movement, Good air movement bilaterally, CTAB RRR,No Gallops,Rubs or new Murmurs, No Parasternal Heave +ve B.Sounds, Abd Soft, lower abdominal tenderness, No organomegaly appriciated, No rebound - guarding or rigidity. No Cyanosis, Clubbing or edema, No new Rash or bruise      Data Review   Micro Results Recent Results (from the past 240 hour(s))  Blood culture (routine x 2)     Status: None (Preliminary result)   Collection Time: 10/12/14  5:46 PM  Result Value Ref Range Status   Specimen Description BLOOD ARM RIGHT  Final   Special Requests BOTTLES DRAWN AEROBIC AND ANAEROBIC 5CC  Final   Culture   Final           BLOOD CULTURE RECEIVED NO GROWTH TO DATE CULTURE WILL BE HELD FOR 5 DAYS BEFORE ISSUING A FINAL NEGATIVE REPORT Performed at [facility]    Report Status PENDING  Incomplete  Blood culture (routine x 2)     Status: None (Preliminary result)   Collection Time: 10/12/14  5:52 PM  Result Value Ref Range Status   Specimen Description BLOOD ARM LEFT  Final   Special Requests BOTTLES DRAWN AEROBIC AND ANAEROBIC 5CC  Final   Culture   Final           BLOOD CULTURE RECEIVED NO GROWTH TO DATE CULTURE WILL BE HELD FOR 5 DAYS BEFORE ISSUING A FINAL NEGATIVE REPORT Performed at [facility]    Report Status PENDING  Incomplete  Culture, blood (routine x  2)     Status: None (Preliminary result)   Collection Time: 10/13/14  5:35 PM  Result Value Ref Range Status   Specimen Description BLOOD LEFT  ARM  Final   Special Requests BOTTLES DRAWN AEROBIC AND ANAEROBIC 10 CC EACH  Final   Culture PENDING  Incomplete   Report Status PENDING  Incomplete    Radiology Reports Ct Abdomen Pelvis W Contrast  10/12/2014   CLINICAL DATA:  59yom, lower abd pain, n/v/d, fever, and cysts on buttocks and in his groin. Past medical history includes DM.  EXAM: CT ABDOMEN AND PELVIS WITH CONTRAST  TECHNIQUE: Multidetector CT imaging of the abdomen and pelvis was performed using the Chen protocol following bolus administration of intravenous contrast.  CONTRAST:  OMNIPAQUE IOHEXOL 300 MG/ML  SOLN  COMPARISON:  03/24/2006  FINDINGS: There is a heterogeneous collection contiguous with the posterior margin of the mid sigmoid colon measuring 4 cm x 3.3 cm x 3.6 cm. Is contiguous with an area of inflammatory changes small extraluminal bubbles of air that tracks along the course of the central left iliac vessels. The contiguous sigmoid colon shows mild wall thickening and multiple diverticula. Hazy inflammatory changes seen in the adjacent fat. Findings are most consistent with a very diverticular abscess.  There are additional scattered left colon diverticula without other evidence of diverticulitis. Remainder of the colon is unremarkable. Normal appendix is visualized. Normal small bowel.  There are prominent inguinal and external iliac chain lymph nodes. Largest measure 1 cm short axis. No other adenopathy. No ascites.  Lung bases essentially clear. Liver shows diffuse fatty infiltration. No liver mass or focal lesion.  Spleen, gallbladder, pancreas, adrenal glands, kidneys, ureters and bladder: Unremarkable.  Mild degenerative changes noted of the visualized spine. No osteoblastic or osteolytic lesions.  IMPRESSION: 1. Sigmoid colon diverticulitis with a very diverticular  abscess measuring 4 cm in greatest dimension and a small amount of adjacent, contained, extraluminal air. 2. No other acute abnormalities. 3. Hepatic steatosis.   Electronically Signed   By: Amie Portland M.D.   On: 10/12/2014 17:15     CBC  Recent Labs Lab 10/12/14 1128 10/13/14 0515 10/14/14 0503 10/15/14 0645  WBC 16.1* 15.7* 16.9* 12.7*  HGB 13.5 12.1* 12.0* 10.4*  HCT 40.6 36.9* 37.1* 33.4*  PLT 277 247 244 246  MCV 93.5 93.2 92.8 92.5  MCH 31.1 30.6 30.0 28.8  MCHC 33.3 32.8 32.3 31.1  RDW 12.7 12.9 12.6 12.7  LYMPHSABS 2.4  --   --   --   MONOABS 1.6*  --   --   --   EOSABS 0.3  --   --   --   BASOSABS 0.0  --   --   --     Chemistries   Recent Labs Lab 10/12/14 1128 10/13/14 0515 10/14/14 0503 10/15/14 0645  NA 138 134* 135 136  K 4.4 3.7 3.6 3.6  CL 106 102 103 105  CO2 GLUCOSE 190* 188* 171* 140*  BUN CREATININE 1.24 1.20 8.41 0.98  CALCIUM 9.1 8.3* 8.4 8.1*  AST 16 15  --   --   ALT 17 15  --   --   ALKPHOS 56 55  --   --   BILITOT 0.8 1.8*  --   --    ------------------------------------------------------------------------------------------------------------------ estimated creatinine clearance is 108.2 mL/min (by C-G formula based on Cr of 0.98). ------------------------------------------------------------------------------------------------------------------  Recent Labs  10/13/14 0515  HGBA1C 8.5*   ------------------------------------------------------------------------------------------------------------------ No results for input(s): CHOL, HDL, LDLCALC, TRIG, CHOLHDL, LDLDIRECT in the last 72 hours. ------------------------------------------------------------------------------------------------------------------ No results for input(s): TSH, T4TOTAL, T3FREE, THYROIDAB  in the last 72 hours.  Invalid input(s):  FREET3 ------------------------------------------------------------------------------------------------------------------ No results for input(s): VITAMINB12, FOLATE, FERRITIN, TIBC, IRON, RETICCTPCT in the last 72 hours.  Coagulation profile  Recent Labs Lab 10/13/14 0515  INR 1.21    No results for input(s): DDIMER in the last 72 hours.  Cardiac Enzymes No results for input(s): CKMB, TROPONINI, MYOGLOBIN in the last 168 hours.  Invalid input(s): CK ------------------------------------------------------------------------------------------------------------------ Invalid input(s): POCBNP     Time Spent in minutes   35   SINGH,PRASHANT K M.D on 10/15/2014 at 8:56 AM  Between 7am to 7pm - Pager - [phone]  After 7pm go to [email or web address] - password [email or web address]  [facility]   Office  [phone]

## 2014-10-16 LAB — BASIC METABOLIC PANEL
ANION GAP: 5 (ref 5–15)
BUN: 8 mg/dL (ref 6–23)
CALCIUM: 7.9 mg/dL — AB (ref 8.4–10.5)
CO2: 28 mmol/L (ref 19–32)
Chloride: 104 mmol/L (ref 96–112)
Creatinine, Ser: 1.03 mg/dL (ref 0.50–1.35)
GFR, EST NON AFRICAN AMERICAN: 78 mL/min — AB (ref >90)
Glucose, Bld: 197 mg/dL — ABNORMAL HIGH (ref 70–99)
Potassium: 4.3 mmol/L (ref 3.5–5.1)
Sodium: 137 mmol/L (ref 135–145)

## 2014-10-16 LAB — GLUCOSE, CAPILLARY
GLUCOSE-CAPILLARY: 169 mg/dL — AB (ref 70–99)
GLUCOSE-CAPILLARY: 136 mg/dL — AB (ref 70–99)
Glucose-Capillary: 166 mg/dL — ABNORMAL HIGH (ref 70–99)
Glucose-Capillary: 170 mg/dL — ABNORMAL HIGH (ref 70–99)
Glucose-Capillary: 177 mg/dL — ABNORMAL HIGH (ref 70–99)

## 2014-10-16 LAB — CBC
HCT: 34.1 % — ABNORMAL LOW (ref 39.0–52.0)
HEMOGLOBIN: 10.9 g/dL — AB (ref 13.0–17.0)
MCH: 29.8 pg (ref 26.0–34.0)
MCHC: 32 g/dL (ref 30.0–36.0)
MCV: 93.2 fL (ref 78.0–100.0)
PLATELETS: 317 10*3/uL (ref 150–400)
RBC: 3.66 MIL/uL — ABNORMAL LOW (ref 4.22–5.81)
RDW: 13 % (ref 11.5–15.5)
WBC: 13.4 10*3/uL — ABNORMAL HIGH (ref 4.0–10.5)

## 2014-10-16 LAB — VANCOMYCIN, TROUGH: Vancomycin Tr: 6.5 ug/mL — ABNORMAL LOW (ref 10.0–20.0)

## 2014-10-16 MED ORDER — ALUM & MAG HYDROXIDE-SIMETH 200-200-20 MG/5ML PO SUSP
30.0000 mL | Freq: Four times a day (QID) | ORAL | Status: DC | PRN
  Administered 2014-10-16 – 2014-10-17 (×3): 30 mL via ORAL
  Filled 2014-10-16 (×3): qty 30

## 2014-10-16 MED ORDER — VANCOMYCIN HCL IN DEXTROSE 1-5 GM/200ML-% IV SOLN
1000.0000 mg | Freq: Three times a day (TID) | INTRAVENOUS | Status: DC
  Administered 2014-10-16 – 2014-10-20 (×12): 1000 mg via INTRAVENOUS
  Filled 2014-10-16 (×16): qty 200

## 2014-10-16 MED ORDER — PANTOPRAZOLE SODIUM 40 MG PO TBEC
40.0000 mg | DELAYED_RELEASE_TABLET | Freq: Two times a day (BID) | ORAL | Status: DC
  Administered 2014-10-16 (×2): 40 mg via ORAL
  Filled 2014-10-16 (×3): qty 1

## 2014-10-16 NOTE — Plan of Care (Signed)
Problem: Phase I Progression Outcomes Goal: Pain controlled with appropriate interventions Outcome: Progressing On PCA with good control

## 2014-10-16 NOTE — Consult Note (Addendum)
WOC ostomy consult note Stoma type/location: Pt had colostomy surgery performed yesterday.  States he is in pain and does not want to have pouch change demonstration performed today. Stomal assessment/size:  Stoma red and viable when visualized through pouch.  Appears to be flush with skin level. Output: Small amt bloody drainage in pouch, no stool or flatus. Ostomy pouching: 2pc.  Education provided: Demonstrated pouch appearance and emptying.  Discussed pouching activities.  Pt and wife at bedside asked appropriate questions.  Educational materials left at bedside and supplies ordered to room for staff nurses if pouch change needs to occur before Monday.  Plan to return at 12:30 on Monday for pouch change demonstration as requested by pt and wife. Cammie Mcgeeawn Stacie Templin MSN, RN, CWOCN, [place]AP, CNS [phone]

## 2014-10-16 NOTE — Progress Notes (Signed)
1 Day Post-Op  Subjective: Difficulty with pain control earlier. Better this morning with PCA Complains of heartburn. On twice a day Protonix and Mylanta. Denies nausea Denies respiratory problems  Good urine output. Wants Foley removed. Hemoglobin 10.9. Stable. The baby BC 13,400. Potassium 4.3. Creatinine 1.03. Glucose 197.   Objective: Vital signs in last 24 hours: Temp:  [98.7 F (37.1 C)-99.7 F (37.6 C)] 98.8 F (37.1 C) (04/01 0939) Pulse Rate:  [85-100] 93 (04/01 0939) Resp:  [17-32] 22 (04/01 0939) BP: (142-171)/(58-71) 142/67 mmHg (04/01 0939) SpO2:  [93 %-100 %] 93 % (04/01 0939) FiO2 (%):  [21 %] 21 % (03/31 1537) Last BM Date: 10/12/14  Intake/Output from previous day: 03/31 0701 - 04/01 0700 In: 2762.5 [I.V.:2562.5; IV Piggyback:200] Out: 1533 [Urine:1075; Drains:108; Blood:350] Intake/Output this shift:    General appearance: Alert. Mild distress from pain. Wife is here. Oriented and cooperative. Resp: clear to auscultation bilaterally GI: Soft. Appropriately tender. Wound clean and packed open. Colostomy dark red and viable. No output. No bowel sounds JP drainage thin serosanguineous  Lab Results:  Results for orders placed or performed during the hospital encounter of 10/12/14 (from the past 24 hour(s))  Glucose, capillary     Status: Abnormal   Collection Time: 10/15/14 11:47 AM  Result Value Ref Range   Glucose-Capillary 137 (H) 70 - 99 mg/dL  Surgical pcr screen     Status: Abnormal   Collection Time: 10/15/14 11:53 AM  Result Value Ref Range   MRSA, PCR POSITIVE (A) NEGATIVE   Staphylococcus aureus POSITIVE (A) NEGATIVE  Glucose, capillary     Status: Abnormal   Collection Time: 10/15/14  1:12 PM  Result Value Ref Range   Glucose-Capillary 125 (H) 70 - 99 mg/dL  Glucose, capillary     Status: Abnormal   Collection Time: 10/15/14  3:45 PM  Result Value Ref Range   Glucose-Capillary 159 (H) 70 - 99 mg/dL   Comment 1 Notify RN   Glucose,  capillary     Status: Abnormal   Collection Time: 10/15/14  6:11 PM  Result Value Ref Range   Glucose-Capillary 196 (H) 70 - 99 mg/dL  Glucose, capillary     Status: Abnormal   Collection Time: 10/16/14 12:24 AM  Result Value Ref Range   Glucose-Capillary 170 (H) 70 - 99 mg/dL   Comment 1 Notify RN    Comment 2 Document in Chart   CBC     Status: Abnormal   Collection Time: 10/16/14  4:44 AM  Result Value Ref Range   WBC 13.4 (H) 4.0 - 10.5 K/uL   RBC 3.66 (L) 4.22 - 5.81 MIL/uL   Hemoglobin 10.9 (L) 13.0 - 17.0 g/dL   HCT 96.0 (L) 45.4 - 09.8 %   MCV 93.2 78.0 - 100.0 fL   MCH 29.8 26.0 - 34.0 pg   MCHC 32.0 30.0 - 36.0 g/dL   RDW 11.9 14.7 - 82.9 %   Platelets 317 150 - 400 K/uL  Basic metabolic panel     Status: Abnormal   Collection Time: 10/16/14  4:44 AM  Result Value Ref Range   Sodium 137 135 - 145 mmol/L   Potassium 4.3 3.5 - 5.1 mmol/L   Chloride 104 96 - 112 mmol/L   CO2 28 19 - 32 mmol/L   Glucose, Bld 197 (H) 70 - 99 mg/dL   BUN 8 6 - 23 mg/dL   Creatinine, Ser 5.62 0.50 - 1.35 mg/dL   Calcium 7.9 (L) 8.4 -  10.5 mg/dL   GFR calc non Af Amer 78 (L) >90 mL/min   GFR calc Af Amer >90 >90 mL/min   Anion gap 5 5 - 15  Glucose, capillary     Status: Abnormal   Collection Time: 10/16/14  6:33 AM  Result Value Ref Range   Glucose-Capillary 177 (H) 70 - 99 mg/dL     Studies/Results: No results found.  Marland Kitchen. antiseptic oral rinse  7 mL Mouth Rinse BID  . aspirin  81 mg Oral Daily  . Chlorhexidine Gluconate Cloth  6 each Topical Q0600  . enoxaparin (LOVENOX) injection  40 mg Subcutaneous Q24H  . heparin  5,000 Units Subcutaneous 3 times per day  . HYDROmorphone PCA 0.3 mg/mL   Intravenous 6 times per day  . insulin aspart  0-15 Units Subcutaneous 4 times per day  . meropenem (MERREM) IV  1 g Intravenous 3 times per day  . mupirocin ointment  1 application Nasal BID  . pantoprazole  40 mg Oral BID  . vancomycin  1,000 mg Intravenous Q12H      Assessment/Plan: s/p Procedure(s): EXPLORATORY LAPAROTOMY WITH DRAINAGE OF INTRA- ABDOMINAL ABSCESS SIGMOID COLECTOMY WITH COLOSTOMY CREATION  POD #1. Sigmoid colectomy with colostomy and drainage of retroperitoneal abscess Continue IV antibiotics Nothing by mouth except ice chips Mobilize out of bed Incentive spirometry Check pathology  Diabetes-on SSI. Management per [facility]  Chronic draining wounds of perineum and pilonidal area.  VTE prophylaxis - start Lovenox today. @PROBHOSP @  LOS: 4 days    Mitsuye Schrodt M 10/16/2014  . .prob

## 2014-10-16 NOTE — Progress Notes (Signed)
Patient DemographiRembert Chen Chen, is a 59 y.o. male, DOB - 1955/10/27, ZOX:[id number]  Admit date - 10/12/2014   Admitting Physician [name], MD  Outpatient Primary MD for the patient is [name], MD  LOS - 4   Chief Complaint  Patient presents with  . Groin Pain      Summary  59 y.o. male with Past medical history of diabetes mellitus, history of hidradenitis, recurrent skin infections. Was admitted to the hospital with abdominal pain secondary to diverticular abscess, seen by general surgery initially was treated with conservative management and subsequently taken to the OR on 10/15/2014 for sigmoid resection with colostomy. He is on empiric IV antibiotics. And gradually improving.   Subjective:   Leroy Chen today has, No headache, No chest pain, +ve lower abdominal pain - No Nausea, No new weakness tingling or numbness, No Cough - SOB.    Assessment & Plan    1. Colonic diverticular abscess. General surgery following, switched to meropenem per general surgery I agree. Per general surgery bowel rest, IV fluids, IV antibiotics. Gen. surgery following, pus not drainable by IR. Status post sigmoid resection with colostomy placement by general surgery on 10/15/2014. Currently nothing by mouth except ice chips. Further management of this problem per general surgery.   2. DM type II. For now ISS  Lab Results  Component Value Date   HGBA1C 8.5* 10/13/2014    CBG (last 3)   Recent Labs  10/15/14 1811 10/16/14 0024 10/16/14 0633  GLUCAP 196* 170* 177*     3. Mildly ARF . Due to dehydration resolved with hydration , continue IV fluids.    4. Pilinoidal sinus draining-  - CCS following, on vancomycin and Meropenam.      Code Status: full  Family  Communication: none  Disposition Plan: Home   Procedures   CT abdomen pelvis   Consults  CCS-IR   Medications  Scheduled Meds: . antiseptic oral rinse  7 mL Mouth Rinse BID  . aspirin  81 mg Oral Daily  . Chlorhexidine Gluconate Cloth  6 each Topical Q0600  . enoxaparin (LOVENOX) injection  40 mg Subcutaneous Q24H  . heparin  5,000 Units Subcutaneous 3 times per day  . HYDROmorphone PCA 0.3 mg/mL   Intravenous 6 times per day  . insulin aspart  0-15 Units Subcutaneous 4 times per day  . meropenem (MERREM) IV  1 g Intravenous 3 times per day  . mupirocin ointment  1 application Nasal BID  . pantoprazole  40 mg Oral BID  . vancomycin  1,000 mg Intravenous Q12H   Continuous Infusions: . 0.9 % NaCl with KCl 20 mEq / L 125 mL/hr at 10/16/14 0129  . lactated ringers 10 mL/hr at 10/15/14 1313   PRN Meds:.acetaminophen **OR** acetaminophen, alum & mag hydroxide-simeth, diphenhydrAMINE **OR** diphenhydrAMINE, HYDROmorphone (DILAUDID) injection, morphine injection, naloxone **AND** sodium chloride, [DISCONTINUED] ondansetron **OR** ondansetron (ZOFRAN) IV, ondansetron **OR** ondansetron (ZOFRAN) IV, ondansetron (ZOFRAN) IV, oxyCODONE-acetaminophen  DVT Prophylaxis    Heparin -    Lab Results  Component Value Date   PLT 317 10/16/2014    Antibiotics     Anti-infectives    Start     Dose/Rate Route Frequency Ordered Stop   10/14/14  0200  vancomycin (VANCOCIN) IVPB 1000 mg/200 mL premix     1,000 mg 200 mL/hr over 60 Minutes Intravenous Every 12 hours 10/13/14 1323     10/13/14 1330  vancomycin (VANCOCIN) IVPB 1000 mg/200 mL premix     1,000 mg 200 mL/hr over 60 Minutes Intravenous STAT 10/13/14 1323 10/13/14 1504   10/13/14 0745  meropenem (MERREM) 1 g in sodium chloride 0.9 % 100 mL IVPB     1 g 200 mL/hr over 30 Minutes Intravenous 3 times per day 10/13/14 0739     10/13/14 0600  ciprofloxacin (CIPRO) IVPB 400 mg  Status:  Discontinued     400 mg 200 mL/hr over 60  Minutes Intravenous Every 12 hours 10/12/14 2052 10/13/14 0739   10/12/14 2130  doxycycline (VIBRAMYCIN) 100 mg in dextrose 5 % 250 mL IVPB  Status:  Discontinued     100 mg 125 mL/hr over 120 Minutes Intravenous Every 12 hours 10/12/14 2037 10/13/14 0909   10/12/14 2045  metroNIDAZOLE (FLAGYL) IVPB 500 mg  Status:  Discontinued     500 mg 100 mL/hr over 60 Minutes Intravenous Every 8 hours 10/12/14 2033 10/13/14 0739   10/12/14 1800  vancomycin (VANCOCIN) 2,000 mg in sodium chloride 0.9 % 500 mL IVPB     2,000 mg 250 mL/hr over 120 Minutes Intravenous  Once 10/12/14 1736 10/12/14 2040   10/12/14 1745  ciprofloxacin (CIPRO) IVPB 400 mg     400 mg 200 mL/hr over 60 Minutes Intravenous  Once 10/12/14 1736 10/12/14 1946   10/12/14 1745  metroNIDAZOLE (FLAGYL) IVPB 500 mg     500 mg 100 mL/hr over 60 Minutes Intravenous  Once 10/12/14 1736 10/12/14 1904          Objective:   Filed Vitals:   10/16/14 0400 10/16/14 0511 10/16/14 0658 10/16/14 0939  BP:   149/66 142/67  Pulse:   96 93  Temp:   98.7 F (37.1 C) 98.8 F (37.1 C)  TempSrc:   Oral Oral  Resp: 28 32 28 22  Height:      Weight:      SpO2: 97% 98% 97% 93%    Wt Readings from Last 3 Encounters:  10/15/14 109.5 kg (241 lb 6.5 oz)  09/24/13 112.946 kg (249 lb)  08/02/12 116.847 kg (257 lb 9.6 oz)     Intake/Output Summary (Last 24 hours) at 10/16/14 0943 Last data filed at 10/16/14 0800  Gross per 24 hour  Intake 2762.5 ml  Output   1533 ml  Net 1229.5 ml     Physical Exam  Awake Alert, Oriented X 3, No new F.N deficits, Normal affect Leroy Chen.AT,PERRAL Supple Neck,No JVD, No cervical lymphadenopathy appriciated.  Symmetrical Chest wall movement, Good air movement bilaterally, CTAB RRR,No Gallops,Rubs or new Murmurs, No Parasternal Heave No B.Sounds, Abd Soft, lower abdominal tenderness, colostomy in place incision site under bandage, No organomegaly appriciated, No rebound - guarding or rigidity. No Cyanosis,  Clubbing or edema, No new Rash or bruise      Data Review   Micro Results Recent Results (from the past 240 hour(s))  Blood culture (routine x 2)     Status: None (Preliminary result)   Collection Time: 10/12/14  5:46 PM  Result Value Ref Range Status   Specimen Description BLOOD ARM RIGHT  Final   Special Requests BOTTLES DRAWN AEROBIC AND ANAEROBIC 5CC  Final   Culture   Final           BLOOD CULTURE  RECEIVED NO GROWTH TO DATE CULTURE WILL BE HELD FOR 5 DAYS BEFORE ISSUING A FINAL NEGATIVE REPORT Performed at [facility]    Report Status PENDING  Incomplete  Blood culture (routine x 2)     Status: None (Preliminary result)   Collection Time: 10/12/14  5:52 PM  Result Value Ref Range Status   Specimen Description BLOOD ARM LEFT  Final   Special Requests BOTTLES DRAWN AEROBIC AND ANAEROBIC 5CC  Final   Culture   Final           BLOOD CULTURE RECEIVED NO GROWTH TO DATE CULTURE WILL BE HELD FOR 5 DAYS BEFORE ISSUING A FINAL NEGATIVE REPORT Performed at [facility]    Report Status PENDING  Incomplete  Culture, blood (routine x 2)     Status: None (Preliminary result)   Collection Time: 10/13/14  5:35 PM  Result Value Ref Range Status   Specimen Description BLOOD LEFT ARM  Final   Special Requests BOTTLES DRAWN AEROBIC AND ANAEROBIC 10 CC EACH  Final   Culture   Final           BLOOD CULTURE RECEIVED NO GROWTH TO DATE CULTURE WILL BE HELD FOR 5 DAYS BEFORE ISSUING A FINAL NEGATIVE REPORT Note: Culture results may be compromised due to an excessive volume of blood received in culture bottles. Performed at [facility]    Report Status PENDING  Incomplete  Culture, blood (routine x 2)     Status: None (Preliminary result)   Collection Time: 10/13/14  5:41 PM  Result Value Ref Range Status   Specimen Description BLOOD LEFT ARM  Final   Special Requests BOTTLES DRAWN AEROBIC AND ANAEROBIC 10 CC EACH  Final   Culture   Final           BLOOD CULTURE  RECEIVED NO GROWTH TO DATE CULTURE WILL BE HELD FOR 5 DAYS BEFORE ISSUING A FINAL NEGATIVE REPORT Note: Culture results may be compromised due to an excessive volume of blood received in culture bottles. Performed at [facility]    Report Status PENDING  Incomplete  Surgical pcr screen     Status: Abnormal   Collection Time: 10/15/14 11:53 AM  Result Value Ref Range Status   MRSA, PCR POSITIVE (A) NEGATIVE Final   Staphylococcus aureus POSITIVE (A) NEGATIVE Final    Comment:        The Xpert SA Assay (FDA approved for NASAL specimens in patients over 22 years of age), is one component of a comprehensive surveillance program.  Test performance has been validated by [facility] for patients greater than or equal to 50 year old. It is not intended to diagnose infection nor to guide or monitor treatment.     Radiology Reports Ct Abdomen Pelvis W Contrast  10/12/2014   CLINICAL DATA:  59yom, lower abd pain, n/v/d, fever, and cysts on buttocks and in his groin. Past medical history includes DM.  EXAM: CT ABDOMEN AND PELVIS WITH CONTRAST  TECHNIQUE: Multidetector CT imaging of the abdomen and pelvis was performed using the Chen protocol following bolus administration of intravenous contrast.  CONTRAST:  OMNIPAQUE IOHEXOL 300 MG/ML  SOLN  COMPARISON:  03/24/2006  FINDINGS: There is a heterogeneous collection contiguous with the posterior margin of the mid sigmoid colon measuring 4 cm x 3.3 cm x 3.6 cm. Is contiguous with an area of inflammatory changes small extraluminal bubbles of air that tracks along the course of the central left iliac  vessels. The contiguous sigmoid colon shows mild wall thickening and multiple diverticula. Hazy inflammatory changes seen in the adjacent fat. Findings are most consistent with a very diverticular abscess.  There are additional scattered left colon diverticula without other evidence of diverticulitis. Remainder of the colon is unremarkable.  Normal appendix is visualized. Normal small bowel.  There are prominent inguinal and external iliac chain lymph nodes. Largest measure 1 cm short axis. No other adenopathy. No ascites.  Lung bases essentially clear. Liver shows diffuse fatty infiltration. No liver mass or focal lesion.  Spleen, gallbladder, pancreas, adrenal glands, kidneys, ureters and bladder: Unremarkable.  Mild degenerative changes noted of the visualized spine. No osteoblastic or osteolytic lesions.  IMPRESSION: 1. Sigmoid colon diverticulitis with a very diverticular abscess measuring 4 cm in greatest dimension and a small amount of adjacent, contained, extraluminal air. 2. No other acute abnormalities. 3. Hepatic steatosis.   Electronically Signed   By: Amie Portland M.D.   On: 10/12/2014 17:15     CBC  Recent Labs Lab 10/12/14 1128 10/13/14 0515 10/14/14 0503 10/15/14 0645 10/16/14 0444  WBC 16.1* 15.7* 16.9* 12.7* 13.4*  HGB 13.5 12.1* 12.0* 10.4* 10.9*  HCT 40.6 36.9* 37.1* 33.4* 34.1*  PLT 277 247 244 246 317  MCV 93.5 93.2 92.8 92.5 93.2  MCH 31.1 30.6 30.0 28.8 29.8  MCHC 33.3 32.8 32.3 31.1 32.0  RDW 12.7 12.9 12.6 12.7 13.0  LYMPHSABS 2.4  --   --   --   --   MONOABS 1.6*  --   --   --   --   EOSABS 0.3  --   --   --   --   BASOSABS 0.0  --   --   --   --     Chemistries   Recent Labs Lab 10/12/14 1128 10/13/14 0515 10/14/14 0503 10/15/14 0645 10/16/14 0444  NA 138 134* 135 136 137  K 4.4 3.7 3.6 3.6 4.3  CL 106 102 103 105 104  CO2 GLUCOSE 190* 188* 171* 140* 197*  BUN CREATININE 1.24 1.20 1.06 0.98 1.03  CALCIUM 9.1 8.3* 8.4 8.1* 7.9*  AST 16 15  --   --   --   ALT 17 15  --   --   --   ALKPHOS 56 55  --   --   --   BILITOT 0.8 1.8*  --   --   --    ------------------------------------------------------------------------------------------------------------------ estimated creatinine clearance is 102.9 mL/min (by C-G formula based on Cr of  1.03). ------------------------------------------------------------------------------------------------------------------ No results for input(s): HGBA1C in the last 72 hours. ------------------------------------------------------------------------------------------------------------------ No results for input(s): CHOL, HDL, LDLCALC, TRIG, CHOLHDL, LDLDIRECT in the last 72 hours. ------------------------------------------------------------------------------------------------------------------ No results for input(s): TSH, T4TOTAL, T3FREE, THYROIDAB in the last 72 hours.  Invalid input(s): FREET3 ------------------------------------------------------------------------------------------------------------------ No results for input(s): VITAMINB12, FOLATE, FERRITIN, TIBC, IRON, RETICCTPCT in the last 72 hours.  Coagulation profile  Recent Labs Lab 10/13/14 0515  INR 1.21    No results for input(s): DDIMER in the last 72 hours.  Cardiac Enzymes No results for input(s): CKMB, TROPONINI, MYOGLOBIN in the last 168 hours.  Invalid input(s): CK ------------------------------------------------------------------------------------------------------------------ Invalid input(s): POCBNP     Time Spent in minutes   35   Elide Stalzer K M.D on 10/16/2014 at 9:43 AM  Between 7am to 7pm - Pager - [phone]  After 7pm go to [email or web address] - password [email or web address]  [facility]  Office  [phone]

## 2014-10-16 NOTE — Progress Notes (Signed)
ANTIBIOTIC CONSULT NOTE - Follow-up  Pharmacy Consult for Vancomyin Indication: Wound infection    Allergies  Allergen Reactions  . Penicillins Swelling    Patient Measurements: Height: 6\' 2"  (188 cm) Weight: 241 lb 6.5 oz (109.5 kg) IBW/kg (Calculated) : 82.2  Vital Signs: Temp: 98.8 F (37.1 C) (04/01 0939) Temp Source: Oral (04/01 0939) BP: 142/67 mmHg (04/01 0939) Pulse Rate: 93 (04/01 0939) Intake/Output from previous day: 03/31 0701 - 04/01 0700 In: 2762.5 [I.V.:2562.5; IV Piggyback:200] Out: 1533 [Urine:1075; Drains:108; Blood:350] Intake/Output from this shift:    Labs:  Recent Labs  10/14/14 0503 10/15/14 0645 10/16/14 0444  WBC 16.9* 12.7* 13.4*  HGB 12.0* 10.4* 10.9*  PLT 244 246 317  CREATININE 1.06 0.98 1.03   Estimated Creatinine Clearance: 102.9 mL/min (by C-G formula based on Cr of 1.03). No results for input(s): VANCOTROUGH, VANCOPEAK, VANCORANDOM, GENTTROUGH, GENTPEAK, GENTRANDOM, TOBRATROUGH, TOBRAPEAK, TOBRARND, AMIKACINPEAK, AMIKACINTROU, AMIKACIN in the last 72 hours.   Microbiology: Recent Results (from the past 720 hour(s))  Blood culture (routine x 2)     Status: None (Preliminary result)   Collection Time: 10/12/14  5:46 PM  Result Value Ref Range Status   Specimen Description BLOOD ARM RIGHT  Final   Special Requests BOTTLES DRAWN AEROBIC AND ANAEROBIC 5CC  Final   Culture   Final           BLOOD CULTURE RECEIVED NO GROWTH TO DATE CULTURE WILL BE HELD FOR 5 DAYS BEFORE ISSUING A FINAL NEGATIVE REPORT [facility]    Report Status PENDING  Incomplete  Blood culture (routine x 2)     Status: None (Preliminary result)   Collection Time: 10/12/14  5:52 PM  Result Value Ref Range Status   Specimen Description BLOOD ARM LEFT  Final   Special Requests BOTTLES DRAWN AEROBIC AND ANAEROBIC 5CC  Final   Culture   Final           BLOOD CULTURE RECEIVED NO GROWTH TO DATE CULTURE WILL BE HELD FOR 5 DAYS BEFORE ISSUING A  FINAL NEGATIVE REPORT [facility]formed at [facility]    Report Status PENDING  Incomplete  Culture, blood (routine x 2)     Status: None (Preliminary result)   Collection Time: 10/13/14  5:35 PM  Result Value Ref Range Status   Specimen Description BLOOD LEFT ARM  Final   Special Requests BOTTLES DRAWN AEROBIC AND ANAEROBIC 10 CC EACH  Final   Culture   Final           BLOOD CULTURE RECEIVED NO GROWTH TO DATE CULTURE WILL BE HELD FOR 5 DAYS BEFORE ISSUING A FINAL NEGATIVE REPORT Note: Culture results may be compromised due to an excessive volume of blood received in culture bottles. Performed at [facility]    Report Status PENDING  Incomplete  Culture, blood (routine x 2)     Status: None (Preliminary result)   Collection Time: 10/13/14  5:41 PM  Result Value Ref Range Status   Specimen Description BLOOD LEFT ARM  Final   Special Requests BOTTLES DRAWN AEROBIC AND ANAEROBIC 10 CC EACH  Final   Culture   Final           BLOOD CULTURE RECEIVED NO GROWTH TO DATE CULTURE WILL BE HELD FOR 5 DAYS BEFORE ISSUING A FINAL NEGATIVE REPORT Note: Culture results may be compromised due to an excessive volume of blood received in culture bottles. Performed at [facility]    Report  Status PENDING  Incomplete  Surgical pcr screen     Status: Abnormal   Collection Time: 10/15/14 11:53 AM  Result Value Ref Range Status   MRSA, PCR POSITIVE (A) NEGATIVE Final   Staphylococcus aureus POSITIVE (A) NEGATIVE Final    Comment:        The Xpert SA Assay (FDA approved for NASAL specimens in patients over 67 years of age), is one component of a comprehensive surveillance program.  Test performance has been validated by [facility] for patients greater than or equal to 24 year old. It is not intended to diagnose infection nor to guide or monitor treatment.    Assessment: 59 y/o M presents with c/o L groin pain (including anal area) with drainage and multiple skin lesions with  h/o hydradenitis (inflamm of sweat glands) and recurrent skin infections. CT suggests mdiverticular abscess as well as folliculitis. Pt continues on Merrem (Day #4) and Vancomycin (Day #5). WBC 13. 4- overall trend down. Afeb.  SCr remains stable  Vancomycin trough 6.5 mcg/ml (subtherapeutic) on 1gm IV q12h  Goal of Therapy:  Vancomycin trough level 10-15 mcg/ml  Plan:  Change Vancomycin to 1gm IV q8h. Will f/u renal function, micro data, pt's clinical condition. VT at Css on new dose   Christoper Fabian, PharmD, BCPS Clinical pharmacist, pager [phone] 10/16/2014,12:41 PM

## 2014-10-16 NOTE — Plan of Care (Signed)
Problem: Phase I Progression Outcomes Goal: OOB as tolerated unless otherwise ordered Outcome: Completed/Met Date Met:  10/16/14 Sat in recliner most of day

## 2014-10-17 ENCOUNTER — Encounter (HOSPITAL_COMMUNITY): Payer: Self-pay | Admitting: Cardiology

## 2014-10-17 DIAGNOSIS — I4891 Unspecified atrial fibrillation: Secondary | ICD-10-CM | POA: Diagnosis not present

## 2014-10-17 DIAGNOSIS — N179 Acute kidney failure, unspecified: Secondary | ICD-10-CM

## 2014-10-17 DIAGNOSIS — I480 Paroxysmal atrial fibrillation: Secondary | ICD-10-CM

## 2014-10-17 DIAGNOSIS — F17200 Nicotine dependence, unspecified, uncomplicated: Secondary | ICD-10-CM | POA: Diagnosis present

## 2014-10-17 DIAGNOSIS — I248 Other forms of acute ischemic heart disease: Secondary | ICD-10-CM | POA: Diagnosis not present

## 2014-10-17 LAB — GLUCOSE, CAPILLARY
GLUCOSE-CAPILLARY: 162 mg/dL — AB (ref 70–99)
Glucose-Capillary: 159 mg/dL — ABNORMAL HIGH (ref 70–99)
Glucose-Capillary: 158 mg/dL — ABNORMAL HIGH (ref 70–99)

## 2014-10-17 LAB — BASIC METABOLIC PANEL
Anion gap: 3 — ABNORMAL LOW (ref 5–15)
BUN: 7 mg/dL (ref 6–23)
CO2: 30 mmol/L (ref 19–32)
CREATININE: 0.99 mg/dL (ref 0.50–1.35)
Calcium: 7.9 mg/dL — ABNORMAL LOW (ref 8.4–10.5)
Chloride: 103 mmol/L (ref 96–112)
GFR calc Af Amer: >90 mL/min (ref >90)
GFR calc non Af Amer: 88 mL/min — ABNORMAL LOW (ref >90)
GLUCOSE: 155 mg/dL — AB (ref 70–99)
Potassium: 4.3 mmol/L (ref 3.5–5.1)
SODIUM: 136 mmol/L (ref 135–145)

## 2014-10-17 LAB — CBC
HEMATOCRIT: 31.7 % — AB (ref 39.0–52.0)
HEMOGLOBIN: 9.9 g/dL — AB (ref 13.0–17.0)
MCH: 29.6 pg (ref 26.0–34.0)
MCHC: 31.2 g/dL (ref 30.0–36.0)
MCV: 94.9 fL (ref 78.0–100.0)
PLATELETS: 322 10*3/uL (ref 150–400)
RBC: 3.34 MIL/uL — AB (ref 4.22–5.81)
RDW: 12.9 % (ref 11.5–15.5)
WBC: 14.1 10*3/uL — AB (ref 4.0–10.5)

## 2014-10-17 LAB — TROPONIN I
TROPONIN I: 0.04 ng/mL — AB (ref <0.031)
Troponin I: 0.07 ng/mL — ABNORMAL HIGH (ref <0.031)
Troponin I: 0.04 ng/mL — ABNORMAL HIGH (ref <0.031)

## 2014-10-17 LAB — HEPARIN LEVEL (UNFRACTIONATED): Heparin Unfractionated: <0.1 IU/mL — ABNORMAL LOW (ref 0.30–0.70)

## 2014-10-17 LAB — TSH: TSH: 1.067 u[IU]/mL (ref 0.350–4.500)

## 2014-10-17 MED ORDER — HEPARIN BOLUS VIA INFUSION
4000.0000 [IU] | Freq: Once | INTRAVENOUS | Status: DC
  Filled 2014-10-17: qty 4000

## 2014-10-17 MED ORDER — DILTIAZEM HCL 25 MG/5ML IV SOLN
10.0000 mg | Freq: Once | INTRAVENOUS | Status: AC
  Administered 2014-10-17: 10 mg via INTRAVENOUS
  Filled 2014-10-17: qty 5

## 2014-10-17 MED ORDER — PANTOPRAZOLE SODIUM 40 MG IV SOLR
40.0000 mg | Freq: Two times a day (BID) | INTRAVENOUS | Status: DC
  Administered 2014-10-17 – 2014-10-22 (×10): 40 mg via INTRAVENOUS
  Filled 2014-10-17 (×11): qty 40

## 2014-10-17 MED ORDER — SODIUM CHLORIDE 0.9 % IV BOLUS (SEPSIS)
500.0000 mL | Freq: Once | INTRAVENOUS | Status: AC
Start: 2014-10-17 — End: 2014-10-17
  Administered 2014-10-17: 500 mL via INTRAVENOUS

## 2014-10-17 MED ORDER — DILTIAZEM HCL 100 MG IV SOLR
5.0000 mg/h | INTRAVENOUS | Status: DC
  Administered 2014-10-17: 10 mg/h via INTRAVENOUS
  Administered 2014-10-17: 5 mg/h via INTRAVENOUS
  Administered 2014-10-18 – 2014-10-19 (×3): 10 mg/h via INTRAVENOUS
  Filled 2014-10-17 (×7): qty 100

## 2014-10-17 MED ORDER — HYDRALAZINE HCL 20 MG/ML IJ SOLN: 10.0000 mg | Freq: Four times a day (QID) | INTRAMUSCULAR | Status: DC | PRN

## 2014-10-17 MED ORDER — HEPARIN (PORCINE) IN NACL 100-0.45 UNIT/ML-% IJ SOLN
2800.0000 [IU]/h | INTRAMUSCULAR | Status: DC
  Administered 2014-10-17: 1400 [IU]/h via INTRAVENOUS
  Administered 2014-10-17: 1800 [IU]/h via INTRAVENOUS
  Administered 2014-10-18: 2200 [IU]/h via INTRAVENOUS
  Administered 2014-10-18: 2500 [IU]/h via INTRAVENOUS
  Administered 2014-10-19 – 2014-10-20 (×3): 2800 [IU]/h via INTRAVENOUS
  Filled 2014-10-17 (×9): qty 250

## 2014-10-17 MED ORDER — HEPARIN BOLUS VIA INFUSION
4000.0000 [IU] | Freq: Once | INTRAVENOUS | Status: AC
  Administered 2014-10-17: 4000 [IU] via INTRAVENOUS
  Filled 2014-10-17: qty 4000

## 2014-10-17 NOTE — Progress Notes (Signed)
[facility] PROGRESS NOTE  MOMODOU CONSIGLIO ZOX:[id number] DOB: [birth year] DOA: 10/12/2014 PCP: [name], MD  Assessment/Plan: 59 y/o male with PMH of HTN, DM, HPL, history of hidradenitis, recurrent skin infections was admitted with abdominal pain secondary to diverticular abscess, seen by general surgery initially was treated with conservative management and subsequently taken to the OR on 10/15/2014 for sigmoid resection with colostomy. He is on empiric IV antibiotics. He developed A Fib RVR on 4/2 AM  1. Colonic diverticular abscess/sepsis.  -03/31 s/p: sigmoid resection with colostomy. Per general surgery bowel rest, IV fluids, IV antibiotics. Further management per general surgery. appreciate input   2. A fib RVR (new); HR is 130-140's; no cardiopulmonary symptoms at this time  -will check echo, serial trop, TF top tele; start IV cardizem bolus with titration; will also start IV heparin (agreed with surgery); consult cardiology evaluation, if remains in atrial fibrillation may need TEE/DCCV; although this could be periop atrial fibrillation.  2. DM type II. ha1c-8.5; For now ISS 3. Mildly ARF . Due to dehydration resolved with hydration , continue IV fluids.  Code Status: full Family Communication: d/w patient (indicate person spoken with, relationship, and if by phone, the number) Disposition Plan: pend clinical improvement    Consultants:  Surgery  Cardiology   Procedures: s/p Procedure(s): EXPLORATORY LAPAROTOMY WITH DRAINAGE OF INTRA- ABDOMINAL ABSCESS SIGMOID COLECTOMY WITH COLOSTOMY CREATION  Antibiotics:  Anti-infectives    Start   Dose/Rate Route Frequency Ordered Stop   10/14/14 0200  vancomycin (VANCOCIN) IVPB 1000 mg/200 mL premix    1,000 mg 200 mL/hr over 60 Minutes Intravenous Every 12 hours 10/13/14 1323    10/13/14 1330  vancomycin (VANCOCIN) IVPB 1000 mg/200 mL premix    1,000 mg 200 mL/hr over 60 Minutes  Intravenous STAT 10/13/14 1323 10/13/14 1504   10/13/14 0745  meropenem (MERREM) 1 g in sodium chloride 0.9 % 100 mL IVPB    1 g 200 mL/hr over 30 Minutes Intravenous 3 times per day 10/13/14 0739    10/13/14 0600  ciprofloxacin (CIPRO) IVPB 400 mg Status: Discontinued    400 mg 200 mL/hr over 60 Minutes Intravenous Every 12 hours 10/12/14 2052 10/13/14 0739   10/12/14 2130  doxycycline (VIBRAMYCIN) 100 mg in dextrose 5 % 250 mL IVPB Status: Discontinued    100 mg 125 mL/hr over 120 Minutes Intravenous Every 12 hours 10/12/14 2037 10/13/14 0909   10/12/14 2045  metroNIDAZOLE (FLAGYL) IVPB 500 mg Status: Discontinued    500 mg 100 mL/hr over 60 Minutes Intravenous Every 8 hours 10/12/14 2033 10/13/14 0739   10/12/14 1800  vancomycin (VANCOCIN) 2,000 mg in sodium chloride 0.9 % 500 mL IVPB    2,000 mg 250 mL/hr over 120 Minutes Intravenous Once 10/12/14 1736 10/12/14 2040   10/12/14 1745  ciprofloxacin (CIPRO) IVPB 400 mg    400 mg 200 mL/hr over 60 Minutes Intravenous Once 10/12/14 1736 10/12/14 1946   10/12/14 1745  metroNIDAZOLE (FLAGYL) IVPB 500 mg    500 mg 100 mL/hr over 60 Minutes Intravenous Once 10/12/14 1736 10/12/14 1904          (indicate start date, and stop date if known)  HPI/Subjective: Alert, oriented; denies any chest pains, no SOB  Objective: Filed Vitals:   10/17/14 0826  BP:   Pulse:   Temp:   Resp: 22    Intake/Output Summary (Last 24 hours) at 10/17/14 0901 Last data filed at 10/17/14 0815  Gross per 24 hour  Intake  1800 ml  Output   2870 ml  Net  -1070 ml   Filed Weights   10/13/14 0436 10/14/14 0530 10/15/14 0546  Weight: 113.1 kg (249 lb 5.4 oz) 109.1 kg (240 lb 8.4 oz) 109.5 kg (241 lb 6.5 oz)    Exam:   General:  Alert, no chest pains   Cardiovascular: s1, s2 irregular, tachy   Respiratory: LL crackles few   Abdomen: soft, post-op,  colostomy   Musculoskeletal: no leg edema   Data Reviewed: Basic Metabolic Panel:  Recent Labs Lab 10/13/14 0515 10/14/14 0503 10/15/14 0645 10/16/14 0444 10/17/14 0325  NA 134* 135 136 137 136  K 3.7 3.6 3.6 4.3 4.3  CL 102 103 105 104 103  CO2 GLUCOSE 188* 171* 140* 197* 155*  BUN CREATININE 1.20 1.06 0.98 1.03 0.99  CALCIUM 8.3* 8.4 8.1* 7.9* 7.9*   Liver Function Tests:  Recent Labs Lab 10/12/14 1128 10/13/14 0515  AST 16 15  ALT 17 15  ALKPHOS 56 55  BILITOT 0.8 1.8*  PROT 8.4* 7.3  ALBUMIN 3.5 2.9*   No results for input(s): LIPASE, AMYLASE in the last 168 hours. No results for input(s): AMMONIA in the last 168 hours. CBC:  Recent Labs Lab 10/12/14 1128 10/13/14 0515 10/14/14 0503 10/15/14 0645 10/16/14 0444 10/17/14 0325  WBC 16.1* 15.7* 16.9* 12.7* 13.4* 14.1*  NEUTROABS 11.8*  --   --   --   --   --   HGB 13.5 12.1* 12.0* 10.4* 10.9* 9.9*  HCT 40.6 36.9* 37.1* 33.4* 34.1* 31.7*  MCV 93.5 93.2 92.8 92.5 93.2 94.9  PLT 277 247 244 246 317 322   Cardiac Enzymes: No results for input(s): CKTOTAL, CKMB, CKMBINDEX, TROPONINI in the last 168 hours. BNP (last 3 results) No results for input(s): BNP in the last 8760 hours.  ProBNP (last 3 results) No results for input(s): PROBNP in the last 8760 hours.  CBG:  Recent Labs Lab 10/16/14 0633 10/16/14 1244 10/16/14 1718 10/16/14 2327 10/17/14 0505  GLUCAP 177* 166* 169* 136* 159*    Recent Results (from the past 240 hour(s))  Blood culture (routine x 2)     Status: None (Preliminary result)   Collection Time: 10/12/14  5:46 PM  Result Value Ref Range Status   Specimen Description BLOOD ARM RIGHT  Final   Special Requests BOTTLES DRAWN AEROBIC AND ANAEROBIC 5CC  Final   Culture   Final           BLOOD CULTURE RECEIVED NO GROWTH TO DATE CULTURE WILL BE HELD FOR 5 DAYS BEFORE ISSUING A FINAL NEGATIVE REPORT Performed at [facility]    Report Status  PENDING  Incomplete  Blood culture (routine x 2)     Status: None (Preliminary result)   Collection Time: 10/12/14  5:52 PM  Result Value Ref Range Status   Specimen Description BLOOD ARM LEFT  Final   Special Requests BOTTLES DRAWN AEROBIC AND ANAEROBIC 5CC  Final   Culture   Final           BLOOD CULTURE RECEIVED NO GROWTH TO DATE CULTURE WILL BE HELD FOR 5 DAYS BEFORE ISSUING A FINAL NEGATIVE REPORT Performed at [facility]    Report Status PENDING  Incomplete  Culture, blood (routine x 2)     Status: None (Preliminary result)   Collection Time: 10/13/14  5:35 PM  Result Value Ref Range Status  Specimen Description BLOOD LEFT ARM  Final   Special Requests BOTTLES DRAWN AEROBIC AND ANAEROBIC 10 CC EACH  Final   Culture   Final           BLOOD CULTURE RECEIVED NO GROWTH TO DATE CULTURE WILL BE HELD FOR 5 DAYS BEFORE ISSUING A FINAL NEGATIVE REPORT Note: Culture results may be compromised due to an excessive volume of blood received in culture bottles. [facility]formed at [facility]    Report Status PENDING  Incomplete  Culture, blood (routine x 2)     Status: None (Preliminary result)   Collection Time: 10/13/14  5:41 PM  Result Value Ref Range Status   Specimen Description BLOOD LEFT ARM  Final   Special Requests BOTTLES DRAWN AEROBIC AND ANAEROBIC 10 CC EACH  Final   Culture   Final           BLOOD CULTURE RECEIVED NO GROWTH TO DATE CULTURE WILL BE HELD FOR 5 DAYS BEFORE ISSUING A FINAL NEGATIVE REPORT Note: Culture results may be compromised due to an excessive volume of blood received in culture bottles. Performed at [facility]    Report Status PENDING  Incomplete  Surgical pcr screen     Status: Abnormal   Collection Time: 10/15/14 11:53 AM  Result Value Ref Range Status   MRSA, PCR POSITIVE (A) NEGATIVE Final   Staphylococcus aureus POSITIVE (A) NEGATIVE Final    Comment:        The Xpert SA Assay (FDA approved for NASAL specimens in patients  over 58 years of age), is one component of a comprehensive surveillance program.  Test performance has been validated by [facility] for patients greater than or equal to 58 year old. It is not intended to diagnose infection nor to guide or monitor treatment.      Studies: No results found.  Scheduled Meds: . antiseptic oral rinse  7 mL Mouth Rinse BID  . aspirin  81 mg Oral Daily  . Chlorhexidine Gluconate Cloth  6 each Topical Q0600  . enoxaparin (LOVENOX) injection  40 mg Subcutaneous Q24H  . HYDROmorphone PCA 0.3 mg/mL   Intravenous 6 times per day  . insulin aspart  0-15 Units Subcutaneous 4 times per day  . meropenem (MERREM) IV  1 g Intravenous 3 times per day  . mupirocin ointment  1 application Nasal BID  . pantoprazole  40 mg Oral BID  . vancomycin  1,000 mg Intravenous 3 times per day   Continuous Infusions: . 0.9 % NaCl with KCl 20 mEq / L 125 mL/hr at 10/17/14 0459  . diltiazem (CARDIZEM) infusion 5 mg/hr (10/17/14 0847)  . lactated ringers 10 mL/hr at 10/15/14 1313    Principal Problem:   Colonic diverticular abscess Active Problems:   Diverticulitis of intestine with abscess without bleeding   Folliculitis   Sepsis   Diabetes mellitus, type 2   Diverticulitis of large intestine with abscess without bleeding    Time spent: >35 minutes     Esperanza SheetsBURIEV, Kamilia Carollo N  [facility] Pager [phone]. If 7PM-7AM, please contact night-coverage at [email or web address], password [email or web address] 10/17/2014, 9:01 AM  LOS: 5 days

## 2014-10-17 NOTE — Progress Notes (Signed)
Called to start cardizem drip, HR 136, BP 151/86, 10 mg IV bolus given as per order, drip started at 5 mg as per order through new PIV in left lower forearm.  Bp rechecked  After bolus 150/71, HR 141. Primary RN giving report to RN on 2W.  BP rechecked 136, 164/82.

## 2014-10-17 NOTE — Progress Notes (Signed)
2 Days Post-Op  Subjective: Stable and alert Heart rate increased to 125 this morning. EKG shows what looks like atrial flutter, although cannot rule out atrial fibrillation. Denies chest pain, dyspnea, or prior cardiac problems. [facility] involved. They're going to start him on Cardizem and transfer to a telemetry unit.  Voiding better now. States she has a good stream. Total urine output 2400 mL. No output from ostomy yet. Feels a little distended but no nausea.  Potassium 4.3. Creatinine 0.99. Glucose 155. Hemoglobin 9.9. White count 14,100. Cardiac enzymes pending.  Objective: Vital signs in last 24 hours: Temp:  [98.1 F (36.7 C)-99.2 F (37.3 C)] 98.1 F (36.7 C) (04/02 0456) Pulse Rate:  [92-134] 134 (04/02 0729) Resp:  [20-27] 27 (04/02 0646) BP: (142-162)/(62-84) 161/84 mmHg (04/02 0729) SpO2:  [93 %-99 %] 94 % (04/02 0729) Last BM Date: 10/12/14  Intake/Output from previous day: 04/01 0701 - 04/02 0700 In: 1800 [I.V.:1500; IV Piggyback:300] Out: 2430 [Urine:2400; Drains:30] Intake/Output this shift: Total I/O In: -  Out: 440 [Urine:440]  General appearance: Alert. No significant distress. Friendly and cooperative. Skin warm and dry. Resp: clear to auscultation bilaterally GI: Abdomen soft but somewhat distended. Midline wound clean, dry, fascia intact. Recent dressing change this morning stoma pink and viable when visualize through pouch bag. JP drainage thin and serosanguineous.  Lab Results:  Results for orders placed or performed during the hospital encounter of 10/12/14 (from the past 24 hour(s))  Glucose, capillary     Status: Abnormal   Collection Time: 10/16/14 12:44 PM  Result Value Ref Range   Glucose-Capillary 166 (H) 70 - 99 mg/dL  Vancomycin, trough     Status: Abnormal   Collection Time: 10/16/14  1:21 PM  Result Value Ref Range   Vancomycin Tr 6.5 (L) 10.0 - 20.0 ug/mL  Glucose, capillary     Status: Abnormal   Collection Time: 10/16/14   5:18 PM  Result Value Ref Range   Glucose-Capillary 169 (H) 70 - 99 mg/dL   Comment 1 Notify RN   Glucose, capillary     Status: Abnormal   Collection Time: 10/16/14 11:27 PM  Result Value Ref Range   Glucose-Capillary 136 (H) 70 - 99 mg/dL  CBC     Status: Abnormal   Collection Time: 10/17/14  3:25 AM  Result Value Ref Range   WBC 14.1 (H) 4.0 - 10.5 K/uL   RBC 3.34 (L) 4.22 - 5.81 MIL/uL   Hemoglobin 9.9 (L) 13.0 - 17.0 g/dL   HCT 16.1 (L) 09.6 - 04.5 %   MCV 94.9 78.0 - 100.0 fL   MCH 29.6 26.0 - 34.0 pg   MCHC 31.2 30.0 - 36.0 g/dL   RDW 40.9 81.1 - 91.4 %   Platelets 322 150 - 400 K/uL  Basic metabolic panel     Status: Abnormal   Collection Time: 10/17/14  3:25 AM  Result Value Ref Range   Sodium 136 135 - 145 mmol/L   Potassium 4.3 3.5 - 5.1 mmol/L   Chloride 103 96 - 112 mmol/L   CO2 30 19 - 32 mmol/L   Glucose, Bld 155 (H) 70 - 99 mg/dL   BUN 7 6 - 23 mg/dL   Creatinine, Ser 7.82 0.50 - 1.35 mg/dL   Calcium 7.9 (L) 8.4 - 10.5 mg/dL   GFR calc non Af Amer 88 (L) >90 mL/min   GFR calc Af Amer >90 >90 mL/min   Anion gap 3 (L) 5 - 15  Glucose,  capillary     Status: Abnormal   Collection Time: 10/17/14  5:05 AM  Result Value Ref Range   Glucose-Capillary 159 (H) 70 - 99 mg/dL     Studies/Results: No results found.  Marland Kitchen. antiseptic oral rinse  7 mL Mouth Rinse BID  . aspirin  81 mg Oral Daily  . Chlorhexidine Gluconate Cloth  6 each Topical Q0600  . diltiazem  10 mg Intravenous Once  . enoxaparin (LOVENOX) injection  40 mg Subcutaneous Q24H  . HYDROmorphone PCA 0.3 mg/mL   Intravenous 6 times per day  . insulin aspart  0-15 Units Subcutaneous 4 times per day  . meropenem (MERREM) IV  1 g Intravenous 3 times per day  . mupirocin ointment  1 application Nasal BID  . pantoprazole  40 mg Oral BID  . vancomycin  1,000 mg Intravenous 3 times per day     Assessment/Plan: s/p Procedure(s): EXPLORATORY LAPAROTOMY WITH DRAINAGE OF INTRA- ABDOMINAL ABSCESS SIGMOID  COLECTOMY WITH COLOSTOMY CREATION   POD #2. Sigmoid colectomy with colostomy and drainage of retroperitoneal abscess Stable surgically. Stoma looks good. Continue IV antibiotics Ileus, expected. -Nothing by mouth except ice chips Mobilize out of bed Incentive spirometry Check pathology   New onset atrial fib/atrial flutter.  Management per [facility]. Start Cardizem. Transfer telemetry Check cardiac enzymes Repeat labs tomorrow.  Diabetes-on SSI. Management per [facility]  Chronic draining wounds of perineum and pilonidal area.  VTE prophylaxis -  Lovenox   @PROBHOSP @  LOS: 5 days    Leroy Chen 10/17/2014

## 2014-10-17 NOTE — Progress Notes (Signed)
Report called to [facility]. Cardizem bolus given and drip started at 555mcg/hr. Being transferred to 2W36. Trina Aoarla Aviela Blundell, RN

## 2014-10-17 NOTE — Progress Notes (Signed)
PT Cancellation Note  Patient Details Name: [name] MRN: [name] DOB: 12/31/1955   Cancelled Treatment:    Reason Eval/Treat Not Completed: Medical issues which prohibited therapy;Other (comment) (Nursing reports unstable vitals this AM and pt declines).  Pt will try tomorrow if able.   [name], [name] 10/17/2014, 1:07 PM   [name], PT MS Acute Rehab Dept. Number: [phone]

## 2014-10-17 NOTE — Consult Note (Signed)
Reason for Consult:   AF with RVR  Requesting Physician: Claybon Jabs  HPI: This is a 59 y.o.overweight AA male with a past medical history significant for DM and smoking. He was admitted diverticular abscess 10/12/14. He underwent exp lap and sigmoid colectomy with colostomy 10/15/14. Post op he was noted to be in AF with RVR and we are asked to consult. I reviewed the pt's records. In March 3015 he presented to the ED with palpitations and was noted to be in AF says nothing was done. His CHADSVASc score is 1 for DM so chronic anticoagulation is not absolutely indicated and he has been on on ASA.  He see's his PCP Q 3 months. He denies any prior cardiac testing. He denies chest pain or unusual dyspnea prior to admission.   PMHx:  Past Medical History  Diagnosis Date  . Diabetes mellitus without complication   . High cholesterol   . Atrial fibrillation March 2015    Past Surgical History  Procedure Laterality Date  . Hand sugery    . Orchiectomy    . Colectomy  10/15/14    SOCHx:  reports that he has been smoking Cigarettes.  He has never used smokeless tobacco. He reports that he drinks alcohol. He reports that he does not use illicit drugs.  FAMHx: There is no significant family history of coronary disease  ALLERGIES: Allergies  Allergen Reactions  . Penicillins Swelling    ROS: Pertinent items are noted in HPI. See H&P for complete ROS He denies any history of stroke, TIA, or GI bleed.    HOME MEDICATIONS: Prior to Admission medications   Medication Sig Start Date End Date Taking? Authorizing Provider  aspirin 81 MG tablet Take 81 mg by mouth daily.   Yes Historical Provider, MD  glimepiride (AMARYL) 4 MG tablet Take 4 mg by mouth daily before breakfast.  06/27/12  Yes Historical Provider, MD  metFORMIN (GLUCOPHAGE) 500 MG tablet Take 500 mg by mouth 2 (two) times daily with a meal.   Yes Historical Provider, MD  Multiple Vitamin (MULTIVITAMIN) tablet  Take 1 tablet by mouth daily.   Yes Historical Provider, MD    HOSPITAL MEDICATIONS: I have reviewed the patient's current medications.  VITALS: Blood pressure 168/79, pulse 126, temperature 99.5 F (37.5 C), temperature source Oral, resp. rate 22, height  (1.88 m), weight 241 lb 6.5 oz (109.5 kg), SpO2 96 %.  PHYSICAL EXAM: General appearance: alert, cooperative, mild distress and moderately obese Neck: no carotid bruit and no JVD Lungs: decreased at bases Heart: irregularly irregular rhythm Abdomen: distended, BS quiet, drain and colostomy noted Extremities: extremities normal, atraumatic, no cyanosis or edema Pulses: 2+ and symmetric Skin: Skin color, texture, turgor normal. No rashes or lesions Neurologic: Grossly normal  LABS: Results for orders placed or performed during the hospital encounter of 10/12/14 (from the past 24 hour(s))  Glucose, capillary     Status: Abnormal   Collection Time: 10/16/14 12:44 PM  Result Value Ref Range   Glucose-Capillary 166 (H) 70 - 99 mg/dL  Vancomycin, trough     Status: Abnormal   Collection Time: 10/16/14  1:21 PM  Result Value Ref Range   Vancomycin Tr 6.5 (L) 10.0 - 20.0 ug/mL  Glucose, capillary     Status: Abnormal   Collection Time: 10/16/14  5:18 PM  Result Value Ref Range   Glucose-Capillary 169 (H) 70 - 99 mg/dL   Comment 1 Notify RN  Glucose, capillary     Status: Abnormal   Collection Time: 10/16/14 11:27 PM  Result Value Ref Range   Glucose-Capillary 136 (H) 70 - 99 mg/dL  CBC     Status: Abnormal   Collection Time: 10/17/14  3:25 AM  Result Value Ref Range   WBC 14.1 (H) 4.0 - 10.5 K/uL   RBC 3.34 (L) 4.22 - 5.81 MIL/uL   Hemoglobin 9.9 (L) 13.0 - 17.0 g/dL   HCT 45.431.7 (L) 09.839.0 - 11.952.0 %   MCV 94.9 78.0 - 100.0 fL   MCH 29.6 26.0 - 34.0 pg   MCHC 31.2 30.0 - 36.0 g/dL   RDW 14.712.9 82.911.5 - 56.215.5 %   Platelets 322 150 - 400 K/uL  Basic metabolic panel     Status: Abnormal   Collection Time: 10/17/14  3:25 AM    Result Value Ref Range   Sodium 136 135 - 145 mmol/L   Potassium 4.3 3.5 - 5.1 mmol/L   Chloride 103 96 - 112 mmol/L   CO2 30 19 - 32 mmol/L   Glucose, Bld 155 (H) 70 - 99 mg/dL   BUN 7 6 - 23 mg/dL   Creatinine, Ser 1.300.99 0.50 - 1.35 mg/dL   Calcium 7.9 (L) 8.4 - 10.5 mg/dL   GFR calc non Af Amer 88 (L) >90 mL/min   GFR calc Af Amer >90 >90 mL/min   Anion gap 3 (L) 5 - 15  Troponin I     Status: Abnormal   Collection Time: 10/17/14  3:25 AM  Result Value Ref Range   Troponin I 0.07 (H) <0.031 ng/mL  Glucose, capillary     Status: Abnormal   Collection Time: 10/17/14  5:05 AM  Result Value Ref Range   Glucose-Capillary 159 (H) 70 - 99 mg/dL    EKG: No EKG done this admission, EKG from March 2015- AF with CVR, septal Qs  IMAGING: No results found.  IMPRESSION: Principal Problem:   Colonic diverticular abscess- s/p colectomy 10/15/14 Active Problems:   Atrial fibrillation with RVR   Diabetes mellitus, type 2   Smoker   Demand ischemia from AF with RVR-Troponin 0.07   RECOMMENDATION: Check 12 lead, document TSH. He appears to have asymptomatic chronic AF with a low CHADSVASC score- he has been on ASA. He was on no rate control drugs prior to admission. It's not surprising his rate is up post op. IV Diltiazem added, Consider po beta blocker at discharge.  MD to see. Resume ASA when taking po. Change echo to Monday.   Time Spent Directly with Patient: 45 minutes  Abelino DerrickKILROY,LUKE K [phone] beeper 10/17/2014, 10:53 AM  Patient seen and examined. I agree with the assessment and plan as detailed above. See also my additional thoughts below.   It appears that the patient has paroxysmal atrial fibrillation with a CHADS2 VAS of 1. He has been treated with aspirin. We do not need to make any changes today. Although we still need to consider whether full anticoagulation should be considered in this patient.   He does need a two-dimensional echo to assess his LV function. With slight  troponin elevation, he will probably need a stress nuclear study at some point. We will continue to follow.  Willa RoughJeffrey Katz, MD, [facility]FACC 10/17/2014 1:06 PM

## 2014-10-17 NOTE — Progress Notes (Signed)
ANTICOAGULATION CONSULT NOTE   Pharmacy Consult for Heparin Indication: atrial fibrillation  Allergies  Allergen Reactions  . Penicillins Swelling    Patient Measurements: Height: 6\' 2"  (188 cm) Weight: 241 lb 6.5 oz (109.5 kg) IBW/kg (Calculated) : 82.2 Heparin Dosing Weight: 102 kg  Vital Signs: Temp: 99.5 F (37.5 C) (04/02 0943) Temp Source: Oral (04/02 0943) BP: 155/64 mmHg (04/02 1809) Pulse Rate: 105 (04/02 1955)  Labs:  Recent Labs  10/15/14 0645 10/16/14 0444 10/17/14 0325 10/17/14 1535 10/17/14 1906  HGB 10.4* 10.9* 9.9*  --   --   HCT 33.4* 34.1* 31.7*  --   --   PLT 246 317 322  --   --   HEPARINUNFRC  --   --   --   --  <0.10*  CREATININE 0.98 1.03 0.99  --   --   TROPONINI  --   --  0.07* 0.04*  --     Estimated Creatinine Clearance: 107.1 mL/min (by C-G formula based on Cr of 0.99).  Assessment: New afib this morning. Now on Diltiazem drip. IV heparin to begin.  POD#2, noted ok to begin IV heparin per surgery.  CHADS2 VAS of 1  Patient accidentally given 5000 units of heparin instead of 4000 ordered, regardless heparin level tonight is undetectable. Will rebolus and increase rate. No bleeding issues noted.  Goal of Therapy:  Heparin level 0.3-0.7 units/ml Monitor platelets by anticoagulation protocol: Yes   Plan:   Heparin 4000 units IV bolus.  Heparin drip to at 1800 units/hr   Heparin level  6hrs  Daily heparin level and CBC while on heparin.  Sheppard CoilFrank Wilson PharmD., BCPS Clinical Pharmacist Pager [phone] 10/17/2014 8:38 PM

## 2014-10-17 NOTE — Significant Event (Signed)
Rapid Response Event Note  Overview: Time Called: 0745 Arrival Time: 0800 Event Type: Cardiac  Initial Focused Assessment: called by primary Rn for patient in rapid afib.  Upon my arrival to patients room, RN and MD at bedside.  Patient lying in bed, looks good, alert and oriented on [place].  Patient denies SOB, or CP.     Interventions:  Primary RN received orders, cardizem drip to be started and transfer to cardiac telemetry.   Event Summary:  RN to call if assistance needed   at      at          [facility], Maryagnes Amosenise Ann

## 2014-10-17 NOTE — Progress Notes (Signed)
ANTICOAGULATION CONSULT NOTE - Initial Consult  Pharmacy Consult for Heparin Indication: atrial fibrillation  Allergies  Allergen Reactions  . Penicillins Swelling    Patient Measurements: Height: 6\' 2"  (188 cm) Weight: 241 lb 6.5 oz (109.5 kg) IBW/kg (Calculated) : 82.2 Heparin Dosing Weight: 102 kg  Vital Signs: Temp: 99.5 F (37.5 C) (04/02 0943) Temp Source: Oral (04/02 0943) BP: 168/79 mmHg (04/02 0943) Pulse Rate: 126 (04/02 0943)  Labs:  Recent Labs  10/15/14 0645 10/16/14 0444 10/17/14 0325  HGB 10.4* 10.9* 9.9*  HCT 33.4* 34.1* 31.7*  PLT 246 317 322  CREATININE 0.98 1.03 0.99  TROPONINI  --   --  0.07*    Estimated Creatinine Clearance: 107.1 mL/min (by C-G formula based on Cr of 0.99).  Assessment:  New afib this morning. Now on Diltiazem drip. IV heparin to begin.  POD#2, noted ok to begin IV heparin per surgery. Was on sq heparin 3/28>4/1; changed to Lovenox 40 mg sq q24hrs on 4/1. Last Lovenox dose given 4/1 at ~1:30pm.  Lovenox discontinued this morning.    Goal of Therapy:  Heparin level 0.3-0.7 units/ml Monitor platelets by anticoagulation protocol: Yes   Plan:   Heparin 4000 units IV bolus.  Heparin drip to begin at 1400 units/hr (~ 14 units/kg/hr).   Heparin level ~ 6hrs after drip begins.  Daily heparin level and CBC while on heparin.  Dennie FettersEgan, Francie Keeling Donovan, RPh Pager: [phone] 10/17/2014,10:11 AM

## 2014-10-17 NOTE — Progress Notes (Signed)
[id number] Dr. York SpanielBuriev was notified of patient's EKG show afibrillation with rvr. Orders was given for cardizem now dose and drip. Dr. Derrell LollingIngram in to see patient. Lab was call and troponin will be done from blood in the lab. Patient is to be transfer to [facility]. Will continue to monitor.

## 2014-10-17 NOTE — Progress Notes (Signed)
[id number] Patient awake states there is alarm going off. Nurse noted that heart rate running 128-140. Patient stated he was having pain and gave is self some PCA dilaudid. Patient did state his abd felt tight. Vital signs are HR 128 B/P 161/78 O2sat 99 on 2l/m nasal cannula. 0700 Dr. Janee Mornhompson notified of elevate pulse order received. [id number] Normal Saline bolus started at 56800ml/hr. 0725 Vital signs HR 134 B/P 162/78 O2 sats 94 on 2l/m nasal cannula. EKG done shows atrial fibrillation. Rapid response notified. Will continue to monitor.

## 2014-10-18 LAB — HEPARIN LEVEL (UNFRACTIONATED)
Heparin Unfractionated: <0.1 IU/mL — ABNORMAL LOW (ref 0.30–0.70)
Heparin Unfractionated: 0.13 IU/mL — ABNORMAL LOW (ref 0.30–0.70)
Heparin Unfractionated: 0.31 IU/mL (ref 0.30–0.70)

## 2014-10-18 LAB — GLUCOSE, CAPILLARY
GLUCOSE-CAPILLARY: 135 mg/dL — AB (ref 70–99)
GLUCOSE-CAPILLARY: 155 mg/dL — AB (ref 70–99)
GLUCOSE-CAPILLARY: 160 mg/dL — AB (ref 70–99)
Glucose-Capillary: 135 mg/dL — ABNORMAL HIGH (ref 70–99)

## 2014-10-18 LAB — CBC
HCT: 32.2 % — ABNORMAL LOW (ref 39.0–52.0)
HEMOGLOBIN: 10.3 g/dL — AB (ref 13.0–17.0)
MCH: 29.8 pg (ref 26.0–34.0)
MCHC: 32 g/dL (ref 30.0–36.0)
MCV: 93.1 fL (ref 78.0–100.0)
Platelets: 344 10*3/uL (ref 150–400)
RBC: 3.46 MIL/uL — AB (ref 4.22–5.81)
RDW: 13 % (ref 11.5–15.5)
WBC: 13.6 10*3/uL — ABNORMAL HIGH (ref 4.0–10.5)

## 2014-10-18 LAB — BASIC METABOLIC PANEL
ANION GAP: 9 (ref 5–15)
BUN: 5 mg/dL — ABNORMAL LOW (ref 6–23)
CALCIUM: 7.9 mg/dL — AB (ref 8.4–10.5)
CHLORIDE: 107 mmol/L (ref 96–112)
CO2: 23 mmol/L (ref 19–32)
CREATININE: 0.79 mg/dL (ref 0.50–1.35)
GFR calc Af Amer: >90 mL/min (ref >90)
GFR calc non Af Amer: >90 mL/min (ref >90)
Glucose, Bld: 155 mg/dL — ABNORMAL HIGH (ref 70–99)
Potassium: 4.1 mmol/L (ref 3.5–5.1)
SODIUM: 139 mmol/L (ref 135–145)

## 2014-10-18 LAB — TROPONIN I: TROPONIN I: 0.18 ng/mL — AB (ref <0.031)

## 2014-10-18 LAB — VANCOMYCIN, TROUGH: Vancomycin Tr: 12.5 ug/mL (ref 10.0–20.0)

## 2014-10-18 MED ORDER — HEPARIN BOLUS VIA INFUSION
3000.0000 [IU] | Freq: Once | INTRAVENOUS | Status: AC
  Administered 2014-10-18: 3000 [IU] via INTRAVENOUS
  Filled 2014-10-18: qty 3000

## 2014-10-18 NOTE — Evaluation (Signed)
Physical Therapy Evaluation Patient Details Name: Leroy Chen MRN: [id number] DOB: 07/07/1956 Today's Date: 10/18/2014   History of Present Illness  59 y/o male with PMH of HTN, DM, HPL, history of hidradenitis, recurrent skin infections was admitted with abdominal pain secondary to diverticular abscess, seen by general surgery initially was treated with conservative management and subsequently taken to the OR on 10/15/2014 for sigmoid resection with colostomy. He is on empiric IV antibiotics. He developed A Fib RVR on 4/2 AM  Clinical Impression  Patient is s/p above surgery resulting in functional limitations due to the deficits listed below (see PT Problem List). Previously independent with all mobility, today required min assist to min guard for safe mobility and transfers. Some lightheadedness upon standing. Tolerated exercises well. Anticipate he will progress quickly towards PT goals and be able to return home with intermittent assist from family as needed when patient is medically stable for d/c. Patient will benefit from skilled PT to increase their independence and safety with mobility to allow discharge to the venue listed below.       Follow Up Recommendations Home health PT;Supervision - Intermittent    Equipment Recommendations   (TBD)    Recommendations for Other Services OT consult     Precautions / Restrictions Precautions Precautions: None Restrictions Weight Bearing Restrictions: No      Mobility  Bed Mobility Overal bed mobility: Needs Assistance Bed Mobility: Rolling;Sidelying to Sit Rolling: Supervision Sidelying to sit: Min assist       General bed mobility comments: supervision to roll onto side with cues and education for log roll technique. Required Min assist for truncal support to rise. Heavy use of rail.  Transfers Overall transfer level: Needs assistance Equipment used: None Transfers: Sit to/from UGI CorporationStand;Stand Pivot Transfers Sit to Stand: Min  guard Stand pivot transfers: Min guard       General transfer comment: Min guard for safety from lowest bed setting. good control with descent into reclining chair. Pt tolerated standing x 5 minutes, feeling lightheaded initially with moderate sway noted although patient able to self correct. pivot to chair without physical assist.  Ambulation/Gait             General Gait Details: Held due to lightheadedness upon standing.  Stairs            Wheelchair Mobility    Modified Rankin (Stroke Patients Only)       Balance Overall balance assessment: Needs assistance Sitting-balance support: No upper extremity supported;Feet supported Sitting balance-Leahy Scale: Good     Standing balance support: No upper extremity supported Standing balance-Leahy Scale: Fair                               Pertinent Vitals/Pain Pain Assessment: 0-10 Pain Score: 7  Pain Location: abdomen Pain Descriptors / Indicators: Constant Pain Intervention(s): Monitored during session;Repositioned;Limited activity within patient's tolerance    Home Living Family/patient expects to be discharged to:: Private residence Living Arrangements: Spouse/significant other Available Help at Discharge: Family;Available PRN/intermittently Type of Home: House Home Access: Stairs to enter Entrance Stairs-Rails: Right;Left Entrance Stairs-Number of Steps: 2 Home Layout: Two level;Able to live on main level with bedroom/bathroom Home Equipment: None;Grab bars - tub/shower;Shower seat - built in      Prior Function Level of Independence: Independent               Hand Dominance   Dominant Hand: Left  Extremity/Trunk Assessment   Upper Extremity Assessment: Defer to OT evaluation           Lower Extremity Assessment: Overall WFL for tasks assessed         Communication   Communication: No difficulties  Cognition Arousal/Alertness: Awake/alert Behavior During Therapy:  WFL for tasks assessed/performed Overall Cognitive Status: Within Functional Limits for tasks assessed                      General Comments      Exercises General Exercises - Lower Extremity Ankle Circles/Pumps: AROM;Both;10 reps;Seated Long Arc Quad: Strengthening;Both;5 reps;Seated Hip Flexion/Marching: Strengthening;Both;10 reps;Standing Mini-Sqauts: Strengthening;Both;10 reps;Standing      Assessment/Plan    PT Assessment Patient needs continued PT services  PT Diagnosis Difficulty walking;Acute pain   PT Problem List Decreased strength;Decreased range of motion;Decreased activity tolerance;Decreased balance;Decreased mobility;Decreased knowledge of use of DME;Obesity;Pain  PT Treatment Interventions DME instruction;Gait training;Stair training;Functional mobility training;Therapeutic activities;Therapeutic exercise;Balance training;Neuromuscular re-education;Patient/family education;Modalities   PT Goals (Current goals can be found in the Care Plan section) Acute Rehab PT Goals Patient Stated Goal: Go home and back to work PT Goal Formulation: With patient Time For Goal Achievement: 11/01/14 Potential to Achieve Goals: Good    Frequency Min 3X/week   Barriers to discharge Decreased caregiver support Does not have 24 hour care    Co-evaluation               End of Session Equipment Utilized During Treatment: Gait belt Activity Tolerance: Other (comment) (Did not ambulate due to lightheadedness and multiple lines) Patient left: in chair;with call bell/phone within reach;with nursing/sitter in room;with SCD's reapplied Nurse Communication: Mobility status         Time: 9528-4132 PT Time Calculation (min) (ACUTE ONLY): 32 min   Charges:   PT Evaluation $Initial PT Evaluation Tier I: 1 Procedure PT Treatments $Therapeutic Activity: 8-22 mins   PT G CodesBerton Mount 10/18/2014, 4:51 PM  Sunday [place], [place] [phone]

## 2014-10-18 NOTE — Progress Notes (Signed)
Changed dressing per MD order and patient's wife observed and stated that she is going to meet with wound care tomorrow as well. Will continue to monitor for drainage.

## 2014-10-18 NOTE — Progress Notes (Signed)
ANTICOAGULATION CONSULT NOTE   Pharmacy Consult for Heparin Indication: atrial fibrillation  Allergies  Allergen Reactions  . Penicillins Swelling    Patient Measurements: Height: 6\' 2"  (188 cm) Weight: 252 lb 8 oz (114.533 kg) IBW/kg (Calculated) : 82.2 Heparin Dosing Weight: 102 kg  Vital Signs: Temp: 99.1 F (37.3 C) (04/03 1505) Temp Source: Oral (04/03 1505) BP: 148/56 mmHg (04/03 1505) Pulse Rate: 82 (04/03 1505)  Labs:  Recent Labs  10/16/14 0444  10/17/14 0325 10/17/14 1535 10/17/14 1906 10/17/14 1943 10/17/14 2322 10/18/14 0533 10/18/14 1411  HGB 10.9*  --  9.9*  --   --   --   --  10.3*  --   HCT 34.1*  --  31.7*  --   --   --   --  32.2*  --   PLT 317  --  322  --   --   --   --  344  --   HEPARINUNFRC  --   --   --   --  <0.10*  --   --  <0.10* 0.13*  CREATININE 1.03  --  0.99  --   --   --   --  0.79  --   TROPONINI  --   < > 0.07* 0.04*  --  0.04* 0.18*  --   --   < > = values in this interval not displayed.  Estimated Creatinine Clearance: 135.4 mL/min (by C-G formula based on Cr of 0.79).  Assessment: New afib this morning. Now on Diltiazem drip. IV heparin to begin.  POD#3, noted ok to begin IV heparin per surgery.  CHADS2 VAS of 1  Heparin level still low at 0.13. No bleeding issues noted. CBC has remained stable.  ID: Merrem/Vancomycin. CT suggests diverticular abscess as well as folliculitis, chronic draining wounds of perineum and pilonidal area, likely will need surgery, pus not drainable by IR. WBC 13.6- overall trend down. Afeb  VT this afternoon is 12.5 within desired goal.   Cipro 3/28>>3/29 Doxy 3/28>>3/29 Flagyl 3/28>>3/29 Vanc 3/28>> 4/1 VT 6.5 on 1gm IV q12h - inc to 1gm IV q8h 4/3 VT 12.5 - continue vanc 1g q8h mero 3/29>>  3/29: BC x 2: NGTD 3/28 BCx3 - ngtd  Goal of Therapy:  Vancomycin trough 10-15 Heparin level 0.3-0.7 units/ml Monitor platelets by anticoagulation protocol: Yes   Plan:  Increase heparin drip  to 2500 units/hr  Heparin level 6hrs Continue vanc 1g q8 hours  Sheppard CoilFrank Wilma Michaelson PharmD., BCPS Clinical Pharmacist Pager [phone] 10/18/2014 4:02 PM

## 2014-10-18 NOTE — Progress Notes (Signed)
ANTICOAGULATION CONSULT NOTE   Pharmacy Consult for Heparin Indication: atrial fibrillation  Allergies  Allergen Reactions  . Penicillins Swelling    Patient Measurements: Height: 6\' 2"  (188 cm) Weight: 252 lb 8 oz (114.533 kg) IBW/kg (Calculated) : 82.2 Heparin Dosing Weight: 102 kg  Vital Signs: Temp: 99.1 F (37.3 C) (04/03 2142) Temp Source: Oral (04/03 2142) BP: 96/77 mmHg (04/03 2142) Pulse Rate: 79 (04/03 2142)  Labs:  Recent Labs  10/16/14 0444  10/17/14 0325 10/17/14 1535  10/17/14 1943 10/17/14 2322 10/18/14 0533 10/18/14 1411 10/18/14 2306  HGB 10.9*  --  9.9*  --   --   --   --  10.3*  --   --   HCT 34.1*  --  31.7*  --   --   --   --  32.2*  --   --   PLT 317  --  322  --   --   --   --  344  --   --   HEPARINUNFRC  --   --   --   --   < >  --   --  <0.10* 0.13* 0.31  CREATININE 1.03  --  0.99  --   --   --   --  0.79  --   --   TROPONINI  --   < > 0.07* 0.04*  --  0.04* 0.18*  --   --   --   < > = values in this interval not displayed.  Estimated Creatinine Clearance: 135.4 mL/min (by C-G formula based on Cr of 0.79).  Assessment: New afib this morning. Now on Diltiazem drip. IV heparin to begin.  POD#3, noted ok to begin IV heparin per surgery.  CHADS2 VAS of 1  Heparin level still low at 0.13. No bleeding issues noted. CBC has remained stable.  Follow-up HL is therapeutic at 0.31 on heparin 2500 units/hr. Nurse notes no issues with infusion or bleeding.  Goal of Therapy:  Vancomycin trough 10-15 Heparin level 0.3-0.7 units/ml Monitor platelets by anticoagulation protocol: Yes   Plan:  Increase heparin drip to 2600 units/hr  6h HL Daily HL/CBC Monitor s/sx of bleeding  Arlean Hoppingorey M. Newman PiesBall, PharmD Clinical Pharmacist Pager [phone]  10/18/2014 11:36 PM

## 2014-10-18 NOTE — Progress Notes (Signed)
ANTICOAGULATION CONSULT NOTE - Follow Up Consult  Pharmacy Consult for Heparin Indication: atrial fibrillation  Allergies  Allergen Reactions  . Penicillins Swelling    Patient Measurements: Height: 6\' 2"  (188 cm) Weight: 252 lb 8 oz (114.533 kg) IBW/kg (Calculated) : 82.2  Vital Signs: Temp: 98.7 F (37.1 C) (04/03 0605) Temp Source: Oral (04/03 0605) BP: 145/56 mmHg (04/03 0605) Pulse Rate: 91 (04/03 0605)  Labs:  Recent Labs  10/16/14 0444  10/17/14 0325 10/17/14 1535 10/17/14 1906 10/17/14 1943 10/17/14 2322 10/18/14 0533  HGB 10.9*  --  9.9*  --   --   --   --  10.3*  HCT 34.1*  --  31.7*  --   --   --   --  32.2*  PLT 317  --  322  --   --   --   --  344  HEPARINUNFRC  --   --   --   --  <0.10*  --   --  <0.10*  CREATININE 1.03  --  0.99  --   --   --   --  0.79  TROPONINI  --   < > 0.07* 0.04*  --  0.04* 0.18*  --   < > = values in this interval not displayed.  Estimated Creatinine Clearance: 135.4 mL/min (by C-G formula based on Cr of 0.79).  Assessment: HL remains low despite rate increase, infusing ok, other labs as above.   Goal of Therapy:  Heparin level 0.3-0.7 units/ml Monitor platelets by anticoagulation protocol: Yes   Plan:  -Heparin 3000 units BOLUS -Increase heparin drip to 2200 units/hr -1400 HL -Daily CBC/HL -Monitor for bleeding  Abran DukeLedford, Adarryl Goldammer 10/18/2014,7:19 AM

## 2014-10-18 NOTE — Progress Notes (Signed)
Patient ID: Leroy Chen, male   DOB: January 11, 1956, 59 y.o.   MRN: [id number] [facility] Surgery Progress Note:   3 Days Post-Op  Subjective: Mental status is clear.  No new complaints.  Taking ice chips only.   Objective: Vital signs in last 24 hours: Temp:  [98.7 F (37.1 C)-98.9 F (37.2 C)] 98.7 F (37.1 C) (04/03 0605) Pulse Rate:  [91-105] 91 (04/03 0605) Resp:  [19-34] 25 (04/03 0852) BP: (145-155)/(56-90) 145/56 mmHg (04/03 0605) SpO2:  [89 %-96 %] 96 % (04/03 0852) FiO2 (%):  [0 %-21 %] 0 % (04/03 0852) Weight:  [114.533 kg (252 lb 8 oz)] 114.533 kg (252 lb 8 oz) (04/03 0640)  Intake/Output from previous day: 04/02 0701 - 04/03 0700 In: 813.1 [I.V.:813.1] Out: 2055 [Urine:2015; Drains:40] Intake/Output this shift: Total I/O In: -  Out: 200 [Urine:200]  Physical Exam: Work of breathing is not labored.  Incision repacked and clean.  Ostomy is slightly retracted.  Bloody discharge in bag.  No flatus.    Lab Results:  Results for orders placed or performed during the hospital encounter of 10/12/14 (from the past 48 hour(s))  Glucose, capillary     Status: Abnormal   Collection Time: 10/16/14 12:44 PM  Result Value Ref Range   Glucose-Capillary 166 (H) 70 - 99 mg/dL  Vancomycin, trough     Status: Abnormal   Collection Time: 10/16/14  1:21 PM  Result Value Ref Range   Vancomycin Tr 6.5 (L) 10.0 - 20.0 ug/mL  Glucose, capillary     Status: Abnormal   Collection Time: 10/16/14  5:18 PM  Result Value Ref Range   Glucose-Capillary 169 (H) 70 - 99 mg/dL   Comment 1 Notify RN   Glucose, capillary     Status: Abnormal   Collection Time: 10/16/14 11:27 PM  Result Value Ref Range   Glucose-Capillary 136 (H) 70 - 99 mg/dL  CBC     Status: Abnormal   Collection Time: 10/17/14  3:25 AM  Result Value Ref Range   WBC 14.1 (H) 4.0 - 10.5 K/uL   RBC 3.34 (L) 4.22 - 5.81 MIL/uL   Hemoglobin 9.9 (L) 13.0 - 17.0 g/dL   HCT 31.7 (L) 39.0 - 52.0 %   MCV 94.9 78.0 - 100.0 fL    MCH 29.6 26.0 - 34.0 pg   MCHC 31.2 30.0 - 36.0 g/dL   RDW 12.9 11.5 - 15.5 %   Platelets 322 150 - 400 K/uL  Basic metabolic panel     Status: Abnormal   Collection Time: 10/17/14  3:25 AM  Result Value Ref Range   Sodium 136 135 - 145 mmol/L   Potassium 4.3 3.5 - 5.1 mmol/L   Chloride 103 96 - 112 mmol/L   CO2 30 19 - 32 mmol/L   Glucose, Bld 155 (H) 70 - 99 mg/dL   BUN 7 6 - 23 mg/dL   Creatinine, Ser 0.99 0.50 - 1.35 mg/dL   Calcium 7.9 (L) 8.4 - 10.5 mg/dL   GFR calc non Af Amer 88 (L) >90 mL/min   GFR calc Af Amer >90 >90 mL/min    Comment: (NOTE) The eGFR has been calculated using the CKD EPI equation. This calculation has not been validated in all clinical situations. eGFR's persistently <90 mL/min signify possible Chronic Kidney Disease.    Anion gap 3 (L) 5 - 15  Troponin I     Status: Abnormal   Collection Time: 10/17/14  3:25 AM  Result Value  Ref Range   Troponin I 0.07 (H) <0.031 ng/mL    Comment:        PERSISTENTLY INCREASED TROPONIN VALUES IN THE RANGE OF 0.04-0.49 ng/mL CAN BE SEEN IN:       -UNSTABLE ANGINA       -CONGESTIVE HEART FAILURE       -MYOCARDITIS       -CHEST TRAUMA       -ARRYHTHMIAS       -LATE PRESENTING MYOCARDIAL INFARCTION       -COPD   CLINICAL FOLLOW-UP RECOMMENDED.   Glucose, capillary     Status: Abnormal   Collection Time: 10/17/14  5:05 AM  Result Value Ref Range   Glucose-Capillary 159 (H) 70 - 99 mg/dL  Glucose, capillary     Status: Abnormal   Collection Time: 10/17/14 11:47 AM  Result Value Ref Range   Glucose-Capillary 158 (H) 70 - 99 mg/dL   Comment 1 Notify RN   Troponin I (q 6hr x 3)     Status: Abnormal   Collection Time: 10/17/14  3:35 PM  Result Value Ref Range   Troponin I 0.04 (H) <0.031 ng/mL    Comment:        PERSISTENTLY INCREASED TROPONIN VALUES IN THE RANGE OF 0.04-0.49 ng/mL CAN BE SEEN IN:       -UNSTABLE ANGINA       -CONGESTIVE HEART FAILURE       -MYOCARDITIS       -CHEST TRAUMA        -ARRYHTHMIAS       -LATE PRESENTING MYOCARDIAL INFARCTION       -COPD   CLINICAL FOLLOW-UP RECOMMENDED.   TSH     Status: None   Collection Time: 10/17/14  3:35 PM  Result Value Ref Range   TSH 1.067 0.350 - 4.500 uIU/mL  Glucose, capillary     Status: Abnormal   Collection Time: 10/17/14  4:42 PM  Result Value Ref Range   Glucose-Capillary 162 (H) 70 - 99 mg/dL   Comment 1 Notify RN   Heparin level (unfractionated)     Status: Abnormal   Collection Time: 10/17/14  7:06 PM  Result Value Ref Range   Heparin Unfractionated <0.10 (L) 0.30 - 0.70 IU/mL    Comment:        IF HEPARIN RESULTS ARE BELOW EXPECTED VALUES, AND PATIENT DOSAGE HAS BEEN CONFIRMED, SUGGEST FOLLOW UP TESTING OF ANTITHROMBIN III LEVELS.   Troponin I (q 6hr x 3)     Status: Abnormal   Collection Time: 10/17/14  7:43 PM  Result Value Ref Range   Troponin I 0.04 (H) <0.031 ng/mL    Comment:        PERSISTENTLY INCREASED TROPONIN VALUES IN THE RANGE OF 0.04-0.49 ng/mL CAN BE SEEN IN:       -UNSTABLE ANGINA       -CONGESTIVE HEART FAILURE       -MYOCARDITIS       -CHEST TRAUMA       -ARRYHTHMIAS       -LATE PRESENTING MYOCARDIAL INFARCTION       -COPD   CLINICAL FOLLOW-UP RECOMMENDED.   Troponin I (q 6hr x 3)     Status: Abnormal   Collection Time: 10/17/14 11:22 PM  Result Value Ref Range   Troponin I 0.18 (H) <0.031 ng/mL    Comment:        PERSISTENTLY INCREASED TROPONIN VALUES IN THE RANGE OF 0.04-0.49 ng/mL CAN BE SEEN IN:       -  UNSTABLE ANGINA       -CONGESTIVE HEART FAILURE       -MYOCARDITIS       -CHEST TRAUMA       -ARRYHTHMIAS       -LATE PRESENTING MYOCARDIAL INFARCTION       -COPD   CLINICAL FOLLOW-UP RECOMMENDED.   Glucose, capillary     Status: Abnormal   Collection Time: 10/18/14 12:34 AM  Result Value Ref Range   Glucose-Capillary 155 (H) 70 - 99 mg/dL  Basic metabolic panel     Status: Abnormal   Collection Time: 10/18/14  5:33 AM  Result Value Ref Range   Sodium 139 135  - 145 mmol/L   Potassium 4.1 3.5 - 5.1 mmol/L   Chloride 107 96 - 112 mmol/L   CO2 23 19 - 32 mmol/L   Glucose, Bld 155 (H) 70 - 99 mg/dL   BUN 5 (L) 6 - 23 mg/dL   Creatinine, Ser 0.79 0.50 - 1.35 mg/dL   Calcium 7.9 (L) 8.4 - 10.5 mg/dL   GFR calc non Af Amer >90 >90 mL/min   GFR calc Af Amer >90 >90 mL/min    Comment: (NOTE) The eGFR has been calculated using the CKD EPI equation. This calculation has not been validated in all clinical situations. eGFR's persistently <90 mL/min signify possible Chronic Kidney Disease.    Anion gap 9 5 - 15  CBC     Status: Abnormal   Collection Time: 10/18/14  5:33 AM  Result Value Ref Range   WBC 13.6 (H) 4.0 - 10.5 K/uL   RBC 3.46 (L) 4.22 - 5.81 MIL/uL   Hemoglobin 10.3 (L) 13.0 - 17.0 g/dL   HCT 32.2 (L) 39.0 - 52.0 %   MCV 93.1 78.0 - 100.0 fL   MCH 29.8 26.0 - 34.0 pg   MCHC 32.0 30.0 - 36.0 g/dL   RDW 13.0 11.5 - 15.5 %   Platelets 344 150 - 400 K/uL  Heparin level (unfractionated)     Status: Abnormal   Collection Time: 10/18/14  5:33 AM  Result Value Ref Range   Heparin Unfractionated <0.10 (L) 0.30 - 0.70 IU/mL    Comment:        IF HEPARIN RESULTS ARE BELOW EXPECTED VALUES, AND PATIENT DOSAGE HAS BEEN CONFIRMED, SUGGEST FOLLOW UP TESTING OF ANTITHROMBIN III LEVELS.   Glucose, capillary     Status: Abnormal   Collection Time: 10/18/14  6:11 AM  Result Value Ref Range   Glucose-Capillary 135 (H) 70 - 99 mg/dL    Radiology/Results: No results found.  Anti-infectives: Anti-infectives    Start     Dose/Rate Route Frequency Ordered Stop   10/16/14 2300  vancomycin (VANCOCIN) IVPB 1000 mg/200 mL premix     1,000 mg 200 mL/hr over 60 Minutes Intravenous 3 times per day 10/16/14 1646     10/14/14 0200  vancomycin (VANCOCIN) IVPB 1000 mg/200 mL premix  Status:  Discontinued     1,000 mg 200 mL/hr over 60 Minutes Intravenous Every 12 hours 10/13/14 1323 10/16/14 1645   10/13/14 1330  vancomycin (VANCOCIN) IVPB 1000 mg/200  mL premix     1,000 mg 200 mL/hr over 60 Minutes Intravenous STAT 10/13/14 1323 10/13/14 1504   10/13/14 0745  meropenem (MERREM) 1 g in sodium chloride 0.9 % 100 mL IVPB     1 g 200 mL/hr over 30 Minutes Intravenous 3 times per day 10/13/14 0739     10/13/14 0600  ciprofloxacin (CIPRO) IVPB  400 mg  Status:  Discontinued     400 mg 200 mL/hr over 60 Minutes Intravenous Every 12 hours 10/12/14 2052 10/13/14 0739   10/12/14 2130  doxycycline (VIBRAMYCIN) 100 mg in dextrose 5 % 250 mL IVPB  Status:  Discontinued     100 mg 125 mL/hr over 120 Minutes Intravenous Every 12 hours 10/12/14 2037 10/13/14 0909   10/12/14 2045  metroNIDAZOLE (FLAGYL) IVPB 500 mg  Status:  Discontinued     500 mg 100 mL/hr over 60 Minutes Intravenous Every 8 hours 10/12/14 2033 10/13/14 0739   10/12/14 1800  vancomycin (VANCOCIN) 2,000 mg in sodium chloride 0.9 % 500 mL IVPB     2,000 mg 250 mL/hr over 120 Minutes Intravenous  Once 10/12/14 1736 10/12/14 2040   10/12/14 1745  ciprofloxacin (CIPRO) IVPB 400 mg     400 mg 200 mL/hr over 60 Minutes Intravenous  Once 10/12/14 1736 10/12/14 1946   10/12/14 1745  metroNIDAZOLE (FLAGYL) IVPB 500 mg     500 mg 100 mL/hr over 60 Minutes Intravenous  Once 10/12/14 1736 10/12/14 1904      Assessment/Plan: Problem List: Patient Active Problem List   Diagnosis Date Noted  . Atrial fibrillation with RVR 10/17/2014  . Smoker 10/17/2014  . Demand ischemia from AF with RVR-Troponin 0.07 10/17/2014  . Diverticulitis of large intestine with abscess without bleeding 10/15/2014  . Diverticulitis of intestine with abscess without bleeding 10/12/2014  . Colonic diverticular abscess- s/p colectomy 10/15/14 10/12/2014  . Folliculitis 35/45/6256  . Sepsis 10/12/2014  . Diabetes mellitus, type 2 10/12/2014  . Hidradenitis suppurativa 08/02/2012    Slowly resolving ileus.  Will offer [place] ice.   3 Days Post-Op    LOS: 6 days   Matt B. Hassell Done, MD, [facility] [phone] beeper [phone]  10/18/2014 10:47 AM

## 2014-10-18 NOTE — Progress Notes (Signed)
[facility] PROGRESS NOTE  Leroy Chen:[id number] DOB: 10-01-1955 DOA: 10/12/2014 PCP: [name], MD  Assessment/Plan: 59 y/o male with PMH of HTN, DM, HPL, history of hidradenitis, recurrent skin infections was admitted with abdominal pain secondary to diverticular abscess, seen by general surgery initially was treated with conservative management and subsequently taken to the OR on 10/15/2014 for sigmoid resection with colostomy. He is on empiric IV antibiotics. He developed A Fib RVR on 4/2 AM  1. Colonic diverticular abscess/sepsis.  -03/31 s/p: sigmoid resection with colostomy. Per general surgery bowel rest, IV fluids, IV antibiotics; blood cultures: NGTD; Further management per general surgery. appreciate input   2. A fib RVR (new); HR is 130-140's; no cardiopulmonary symptoms   -remains in A fib; pend echo, mild elevated trop, cont IV Cardizem,  IV heparin (agreed with surgery); appreciate  cardiology evaluation, if remains in atrial fibrillation may need TEE/DCCV; although this could be periop PAF 2. DM type II. ha1c-8.5; For now ISS 3. Mildly ARF . Due to dehydration resolved with hydration , continue IV fluids.  Code Status: full Family Communication: d/w patient (indicate person spoken with, relationship, and if by phone, the number) Disposition Plan: pend clinical improvement    Consultants:  Surgery  Cardiology   Procedures: s/p Procedure(s): EXPLORATORY LAPAROTOMY WITH DRAINAGE OF INTRA- ABDOMINAL ABSCESS SIGMOID COLECTOMY WITH COLOSTOMY CREATION  Antibiotics:  Anti-infectives    Start   Dose/Rate Route Frequency Ordered Stop   10/14/14 0200  vancomycin (VANCOCIN) IVPB 1000 mg/200 mL premix    1,000 mg 200 mL/hr over 60 Minutes Intravenous Every 12 hours 10/13/14 1323    10/13/14 1330  vancomycin (VANCOCIN) IVPB 1000 mg/200 mL premix    1,000 mg 200 mL/hr over 60 Minutes Intravenous STAT 10/13/14 1323  10/13/14 1504   10/13/14 0745  meropenem (MERREM) 1 g in sodium chloride 0.9 % 100 mL IVPB    1 g 200 mL/hr over 30 Minutes Intravenous 3 times per day 10/13/14 0739    10/13/14 0600  ciprofloxacin (CIPRO) IVPB 400 mg Status: Discontinued    400 mg 200 mL/hr over 60 Minutes Intravenous Every 12 hours 10/12/14 2052 10/13/14 0739   10/12/14 2130  doxycycline (VIBRAMYCIN) 100 mg in dextrose 5 % 250 mL IVPB Status: Discontinued    100 mg 125 mL/hr over 120 Minutes Intravenous Every 12 hours 10/12/14 2037 10/13/14 0909   10/12/14 2045  metroNIDAZOLE (FLAGYL) IVPB 500 mg Status: Discontinued    500 mg 100 mL/hr over 60 Minutes Intravenous Every 8 hours 10/12/14 2033 10/13/14 0739   10/12/14 1800  vancomycin (VANCOCIN) 2,000 mg in sodium chloride 0.9 % 500 mL IVPB    2,000 mg 250 mL/hr over 120 Minutes Intravenous Once 10/12/14 1736 10/12/14 2040   10/12/14 1745  ciprofloxacin (CIPRO) IVPB 400 mg    400 mg 200 mL/hr over 60 Minutes Intravenous Once 10/12/14 1736 10/12/14 1946   10/12/14 1745  metroNIDAZOLE (FLAGYL) IVPB 500 mg    500 mg 100 mL/hr over 60 Minutes Intravenous Once 10/12/14 1736 10/12/14 1904          (indicate start date, and stop date if known)  HPI/Subjective: Alert, oriented; denies any chest pains, no SOB  Objective: Filed Vitals:   10/18/14 0605  BP: 145/56  Pulse: 91  Temp: 98.7 F (37.1 C)  Resp: 20    Intake/Output Summary (Last 24 hours) at 10/18/14 0849 Last data filed at 10/18/14 0643  Gross per 24 hour  Intake 813.08 ml  Output   1615 ml  Net -801.92 ml   Filed Weights   10/14/14 0530 10/15/14 0546 10/18/14 0640  Weight: 109.1 kg (240 lb 8.4 oz) 109.5 kg (241 lb 6.5 oz) 114.533 kg (252 lb 8 oz)    Exam:   General:  Alert, no chest pains   Cardiovascular: s1, s2 irregular, tachy   Respiratory: LL crackles few   Abdomen: soft, post-op, colostomy    Musculoskeletal: no leg edema   Data Reviewed: Basic Metabolic Panel:  Recent Labs Lab 10/14/14 0503 10/15/14 0645 10/16/14 0444 10/17/14 0325 10/18/14 0533  NA 135 136 137 136 139  K 3.6 3.6 4.3 4.3 4.1  CL 103 105 104 103 107  CO2 24 23 28 30 23   GLUCOSE 171* 140* 197* 155* 155*  BUN 9 10 8 7  5*  CREATININE 1.06 0.98 1.03 0.99 0.79  CALCIUM 8.4 8.1* 7.9* 7.9* 7.9*   Liver Function Tests:  Recent Labs Lab 10/12/14 1128 10/13/14 0515  AST 16 15  ALT 17 15  ALKPHOS 56 55  BILITOT 0.8 1.8*  PROT 8.4* 7.3  ALBUMIN 3.5 2.9*   No results for input(s): LIPASE, AMYLASE in the last 168 hours. No results for input(s): AMMONIA in the last 168 hours. CBC:  Recent Labs Lab 10/12/14 1128  10/14/14 0503 10/15/14 0645 10/16/14 0444 10/17/14 0325 10/18/14 0533  WBC 16.1*  < > 16.9* 12.7* 13.4* 14.1* 13.6*  NEUTROABS 11.8*  --   --   --   --   --   --   HGB 13.5  < > 12.0* 10.4* 10.9* 9.9* 10.3*  HCT 40.6  < > 37.1* 33.4* 34.1* 31.7* 32.2*  MCV 93.5  < > 92.8 92.5 93.2 94.9 93.1  PLT 277  < > 244 246 317 322 344  < > = values in this interval not displayed. Cardiac Enzymes:  Recent Labs Lab 10/17/14 0325 10/17/14 1535 10/17/14 1943 10/17/14 2322  TROPONINI 0.07* 0.04* 0.04* 0.18*   BNP (last 3 results) No results for input(s): BNP in the last 8760 hours.  ProBNP (last 3 results) No results for input(s): PROBNP in the last 8760 hours.  CBG:  Recent Labs Lab 10/17/14 0505 10/17/14 1147 10/17/14 1642 10/18/14 0034 10/18/14 0611  GLUCAP 159* 158* 162* 155* 135*    Recent Results (from the past 240 hour(s))  Blood culture (routine x 2)     Status: None (Preliminary result)   Collection Time: 10/12/14  5:46 PM  Result Value Ref Range Status   Specimen Description BLOOD ARM RIGHT  Final   Special Requests BOTTLES DRAWN AEROBIC AND ANAEROBIC 5CC  Final   Culture   Final           BLOOD CULTURE RECEIVED NO GROWTH TO DATE CULTURE WILL BE HELD FOR 5  DAYS BEFORE ISSUING A FINAL NEGATIVE REPORT [facility]    Report Status PENDING  Incomplete  Blood culture (routine x 2)     Status: None (Preliminary result)   Collection Time: 10/12/14  5:52 PM  Result Value Ref Range Status   Specimen Description BLOOD ARM LEFT  Final   Special Requests BOTTLES DRAWN AEROBIC AND ANAEROBIC 5CC  Final   Culture   Final           BLOOD CULTURE RECEIVED NO GROWTH TO DATE CULTURE WILL BE HELD FOR 5 DAYS BEFORE ISSUING A FINAL NEGATIVE REPORT Performed at [facility]    Report Status PENDING  Incomplete  Culture, blood (routine x 2)     Status: None (Preliminary result)   Collection Time: 10/13/14  5:35 PM  Result Value Ref Range Status   Specimen Description BLOOD LEFT ARM  Final   Special Requests BOTTLES DRAWN AEROBIC AND ANAEROBIC 10 CC EACH  Final   Culture   Final           BLOOD CULTURE RECEIVED NO GROWTH TO DATE CULTURE WILL BE HELD FOR 5 DAYS BEFORE ISSUING A FINAL NEGATIVE REPORT Note: Culture results may be compromised due to an excessive volume of blood received in culture bottles. Performed at [facility]    Report Status PENDING  Incomplete  Culture, blood (routine x 2)     Status: None (Preliminary result)   Collection Time: 10/13/14  5:41 PM  Result Value Ref Range Status   Specimen Description BLOOD LEFT ARM  Final   Special Requests BOTTLES DRAWN AEROBIC AND ANAEROBIC 10 CC EACH  Final   Culture   Final           BLOOD CULTURE RECEIVED NO GROWTH TO DATE CULTURE WILL BE HELD FOR 5 DAYS BEFORE ISSUING A FINAL NEGATIVE REPORT Note: Culture results may be compromised due to an excessive volume of blood received in culture bottles. Performed at [facility]    Report Status PENDING  Incomplete  Surgical pcr screen     Status: Abnormal   Collection Time: 10/15/14 11:53 AM  Result Value Ref Range Status   MRSA, PCR POSITIVE (A) NEGATIVE Final   Staphylococcus aureus POSITIVE (A) NEGATIVE  Final    Comment:        The Xpert SA Assay (FDA approved for NASAL specimens in patients over 64 years of age), is one component of a comprehensive surveillance program.  Test performance has been validated by [facility] for patients greater than or equal to 7 year old. It is not intended to diagnose infection nor to guide or monitor treatment.      Studies: No results found.  Scheduled Meds: . antiseptic oral rinse  7 mL Mouth Rinse BID  . aspirin  81 mg Oral Daily  . Chlorhexidine Gluconate Cloth  6 each Topical Q0600  . HYDROmorphone PCA 0.3 mg/mL   Intravenous 6 times per day  . insulin aspart  0-15 Units Subcutaneous 4 times per day  . meropenem (MERREM) IV  1 g Intravenous 3 times per day  . mupirocin ointment  1 application Nasal BID  . pantoprazole (PROTONIX) IV  40 mg Intravenous Q12H  . vancomycin  1,000 mg Intravenous 3 times per day   Continuous Infusions: . 0.9 % NaCl with KCl 20 mEq / L 100 mL/hr at 10/18/14 0536  . diltiazem (CARDIZEM) infusion 10 mg/hr (10/18/14 0359)  . heparin 2,200 Units/hr (10/18/14 0718)  . lactated ringers 10 mL/hr at 10/15/14 1313    Principal Problem:   Colonic diverticular abscess- s/p colectomy 10/15/14 Active Problems:   Diabetes mellitus, type 2   Atrial fibrillation with RVR   Smoker   Demand ischemia from AF with RVR-Troponin 0.07    Time spent: >35 minutes     Esperanza Sheets  [facility] Pager [phone]. If 7PM-7AM, please contact night-coverage at [email or web address], password [facility] 10/18/2014, 8:49 AM  LOS: 6 days

## 2014-10-19 ENCOUNTER — Encounter (HOSPITAL_COMMUNITY): Payer: Self-pay | Admitting: General Surgery

## 2014-10-19 DIAGNOSIS — E119 Type 2 diabetes mellitus without complications: Secondary | ICD-10-CM

## 2014-10-19 DIAGNOSIS — I4891 Unspecified atrial fibrillation: Secondary | ICD-10-CM

## 2014-10-19 LAB — CBC
HEMATOCRIT: 33.3 % — AB (ref 39.0–52.0)
Hemoglobin: 9.6 g/dL — ABNORMAL LOW (ref 13.0–17.0)
MCH: 27.6 pg (ref 26.0–34.0)
MCHC: 28.8 g/dL — ABNORMAL LOW (ref 30.0–36.0)
MCV: 95.7 fL (ref 78.0–100.0)
Platelets: 340 10*3/uL (ref 150–400)
RBC: 3.48 MIL/uL — AB (ref 4.22–5.81)
RDW: 13.2 % (ref 11.5–15.5)
WBC: 9.9 10*3/uL (ref 4.0–10.5)

## 2014-10-19 LAB — CULTURE, BLOOD (ROUTINE X 2)
CULTURE: NO GROWTH
Culture: NO GROWTH

## 2014-10-19 LAB — GLUCOSE, CAPILLARY
GLUCOSE-CAPILLARY: 143 mg/dL — AB (ref 70–99)
Glucose-Capillary: 142 mg/dL — ABNORMAL HIGH (ref 70–99)
Glucose-Capillary: 151 mg/dL — ABNORMAL HIGH (ref 70–99)
Glucose-Capillary: 127 mg/dL — ABNORMAL HIGH (ref 70–99)

## 2014-10-19 LAB — HEPARIN LEVEL (UNFRACTIONATED)
Heparin Unfractionated: 0.21 IU/mL — ABNORMAL LOW (ref 0.30–0.70)
Heparin Unfractionated: 0.52 IU/mL (ref 0.30–0.70)

## 2014-10-19 MED ORDER — METOPROLOL TARTRATE 1 MG/ML IV SOLN
2.5000 mg | Freq: Four times a day (QID) | INTRAVENOUS | Status: DC
Start: 2014-10-19 — End: 2014-10-22
  Administered 2014-10-19 – 2014-10-22 (×12): 2.5 mg via INTRAVENOUS
  Filled 2014-10-19 (×16): qty 5

## 2014-10-19 MED ORDER — HEPARIN BOLUS VIA INFUSION
1500.0000 [IU] | Freq: Once | INTRAVENOUS | Status: AC
  Administered 2014-10-19: 1500 [IU] via INTRAVENOUS
  Filled 2014-10-19: qty 1500

## 2014-10-19 NOTE — Progress Notes (Signed)
Patient ID: Leroy Chen, male   DOB: 08/02/1955, 59 y.o.   MRN: [id number] 4 Days Post-Op  Subjective: Pt had a rough night.  Trouble getting comfortable.  Feels bloated  Objective: Vital signs in last 24 hours: Temp:  [98.9 F (37.2 C)-99.6 F (37.6 C)] 98.9 F (37.2 C) (04/04 0400) Pulse Rate:  [78-82] 78 (04/04 0400) Resp:  [20-28] 26 (04/04 0754) BP: (96-153)/(56-77) 137/66 mmHg (04/04 0400) SpO2:  [94 %-98 %] 97 % (04/04 0754) FiO2 (%):  [0 %-98 %] 98 % (04/04 0414) Weight:  [114.443 kg (252 lb 4.8 oz)] 114.443 kg (252 lb 4.8 oz) (04/04 0400) Last BM Date:  (prior to surgery)  Intake/Output from previous day: 04/03 0701 - 04/04 0700 In: 2665.2 [I.V.:865.2; IV Piggyback:1800] Out: 1810 [Urine:1750; Drains:60] Intake/Output this shift:    PE: Abd: soft, some distention, but has some BS, appropriately tender, incisional wound is clean and packed.  Ostomy with no output except some sweat, stoma is viable Heart: regular Lungs: CTAB  Lab Results:   Recent Labs  10/18/14 0533 10/19/14 0446  WBC 13.6* 9.9  HGB 10.3* 9.6*  HCT 32.2* 33.3*  PLT 344 340   BMET  Recent Labs  10/17/14 0325 10/18/14 0533  NA 136 139  K 4.3 4.1  CL 103 107  CO2 30 23  GLUCOSE 155* 155*  BUN 7 5*  CREATININE 0.99 0.79  CALCIUM 7.9* 7.9*   PT/INR No results for input(s): LABPROT, INR in the last 72 hours. CMP     Component Value Date/Time   NA 139 10/18/2014 0533   K 4.1 10/18/2014 0533   CL 107 10/18/2014 0533   CO2 23 10/18/2014 0533   GLUCOSE 155* 10/18/2014 0533   BUN 5* 10/18/2014 0533   CREATININE 0.79 10/18/2014 0533   CALCIUM 7.9* 10/18/2014 0533   PROT 7.3 10/13/2014 0515   ALBUMIN 2.9* 10/13/2014 0515   AST 15 10/13/2014 0515   ALT 15 10/13/2014 0515   ALKPHOS 55 10/13/2014 0515   BILITOT 1.8* 10/13/2014 0515   GFRNONAA >90 10/18/2014 0533   GFRAA >90 10/18/2014 0533   Lipase  No results found for: LIPASE     Studies/Results: No results  found.  Anti-infectives: Anti-infectives    Start     Dose/Rate Route Frequency Ordered Stop   10/16/14 2300  vancomycin (VANCOCIN) IVPB 1000 mg/200 mL premix     1,000 mg 200 mL/hr over 60 Minutes Intravenous 3 times per day 10/16/14 1646     10/14/14 0200  vancomycin (VANCOCIN) IVPB 1000 mg/200 mL premix  Status:  Discontinued     1,000 mg 200 mL/hr over 60 Minutes Intravenous Every 12 hours 10/13/14 1323 10/16/14 1645   10/13/14 1330  vancomycin (VANCOCIN) IVPB 1000 mg/200 mL premix     1,000 mg 200 mL/hr over 60 Minutes Intravenous STAT 10/13/14 1323 10/13/14 1504   10/13/14 0745  meropenem (MERREM) 1 g in sodium chloride 0.9 % 100 mL IVPB     1 g 200 mL/hr over 30 Minutes Intravenous 3 times per day 10/13/14 0739     10/13/14 0600  ciprofloxacin (CIPRO) IVPB 400 mg  Status:  Discontinued     400 mg 200 mL/hr over 60 Minutes Intravenous Every 12 hours 10/12/14 2052 10/13/14 0739   10/12/14 2130  doxycycline (VIBRAMYCIN) 100 mg in dextrose 5 % 250 mL IVPB  Status:  Discontinued     100 mg 125 mL/hr over 120 Minutes Intravenous Every 12 hours 10/12/14 2037  10/13/14 0909   10/12/14 2045  metroNIDAZOLE (FLAGYL) IVPB 500 mg  Status:  Discontinued     500 mg 100 mL/hr over 60 Minutes Intravenous Every 8 hours 10/12/14 2033 10/13/14 0739   10/12/14 1800  vancomycin (VANCOCIN) 2,000 mg in sodium chloride 0.9 % 500 mL IVPB     2,000 mg 250 mL/hr over 120 Minutes Intravenous  Once 10/12/14 1736 10/12/14 2040   10/12/14 1745  ciprofloxacin (CIPRO) IVPB 400 mg     400 mg 200 mL/hr over 60 Minutes Intravenous  Once 10/12/14 1736 10/12/14 1946   10/12/14 1745  metroNIDAZOLE (FLAGYL) IVPB 500 mg     500 mg 100 mL/hr over 60 Minutes Intravenous  Once 10/12/14 1736 10/12/14 1904       Assessment/Plan s/p Gertie Gowda procedure for diverticulitis with abscess - 10/15/2014 - Derrell Lolling  -NPO, but may have sips of clears from the floor.  Await bowel function  -ambulate in the halls TID, PT  ordered  - WD dressing changes BID  -WOC consult DVT prophylaxis -heparin drip for new onset a fib New onset A fib  -per primary service -now in NSR  On isolation for MRSA   LOS: 7 days    OSBORNE,KELLY E 10/19/2014, 8:38 AM Pager: [phone]  Agree with above. He is very distended.  Has no output in ostomy.  Will need to go slow with diet. Drain RLQ - 60 cc recorded last 24 hours.  Ovidio Kin, MD, [facility] Surgery Pager: [phone] Office phone:  [phone]

## 2014-10-19 NOTE — Progress Notes (Signed)
ANTICOAGULATION CONSULT NOTE   Pharmacy Consult for Heparin Indication: atrial fibrillation  Allergies  Allergen Reactions  . Penicillins Swelling    Patient Measurements: Height: 6\' 2"  (188 cm) Weight: 252 lb 8 oz (114.533 kg) IBW/kg (Calculated) : 82.2 Heparin Dosing Weight: 102 kg  Vital Signs: Temp: 98.9 F (37.2 C) (04/04 0400) Temp Source: Oral (04/04 0400) BP: 137/66 mmHg (04/04 0400) Pulse Rate: 78 (04/04 0400)  Labs:  Recent Labs  10/17/14 0325 10/17/14 1535  10/17/14 1943 10/17/14 2322 10/18/14 0533 10/18/14 1411 10/18/14 2306 10/19/14 0446  HGB 9.9*  --   --   --   --  10.3*  --   --  9.6*  HCT 31.7*  --   --   --   --  32.2*  --   --  33.3*  PLT 322  --   --   --   --  344  --   --  340  HEPARINUNFRC  --   --   < >  --   --  <0.10* 0.13* 0.31 0.21*  CREATININE 0.99  --   --   --   --  0.79  --   --   --   TROPONINI 0.07* 0.04*  --  0.04* 0.18*  --   --   --   --   < > = values in this interval not displayed.  Estimated Creatinine Clearance: 135.4 mL/min (by C-G formula based on Cr of 0.79).  Assessment: New afib this morning. Now on Diltiazem drip. IV heparin to begin.  POD#3, noted ok to begin IV heparin per surgery.  CHADS2 VAS of 1  Heparin level still low at 0.13. No bleeding issues noted. CBC has remained stable.  Follow-up HL is therapeutic at 0.31 on heparin 2500 units/hr. Nurse notes no issues with infusion or bleeding.  Confirmatory HL is SUBtherapeutic at 0.21 on heparin 2600 units/hr. Nurse reports that continuous IV was leaking shortly before heparin level was drawn, but was not significant. Will rebolus and increase heparin rate.  Goal of Therapy:  Heparin level 0.3-0.7 units/ml Monitor platelets by anticoagulation protocol: Yes   Plan:  Bolus heparin 1500 units Increase heparin drip to 2800 units/hr  6h HL Daily HL/CBC Monitor s/sx of bleeding  Arlean Hoppingorey M. Newman PiesBall, PharmD Clinical Pharmacist Pager [phone]  10/19/2014 6:00  AM

## 2014-10-19 NOTE — Progress Notes (Signed)
PATIENT DETAILS Name: Leroy Chen Age: 59 y.o. Sex: male Date of Birth: October 26, 1955 Admit Date: 10/12/2014 Admitting Physician [name], MD WUJ:[id number],WGNF Jomarie Longs, MD  Subjective: No major issues overnight-still has some pain at the operative site.  Assessment/Plan: Principal Problem:   Colonic diverticular abscess- s/p colectomy 10/15/14: Admitted and started on empiric antibiotics and other conservative treatment, unfortunately did not make any significant improvement with supportive measures, underwent Hartman's procedure on 10/15/14. Continues to be mostly nothing by mouth, and currently awaiting return of bowel function. General surgery following.   Active Problems:   Atrial fibrillation with RVR: Hospital stay complicated by development of atrial fibrillation with rapid ventricular response, started on IV Cardizem and IV heparin. Cardiology has been consulted, Cardizem has been discontinued, patient now on metoprolol IV. Await echocardiogram, if EF is normal, will discontinue IV heparin and place on aspirin as CHADSVASc score is 1.     Diabetes mellitus, type 2: CBG's stable, continue with SSI for now, resume Amaryl when oral intake has resumed. Will plan on resuming metformin on discharge  Atrial fibrillation with RVR    Minimally elevated troponins: Likely secondary to demand ischemia in the setting of A. fib with RVR. Cardiology following. Await echo    Acute renal failure: Likely from prerenal azotemia. Resolved with supportive care.  Disposition: Remain inpatient  Antibiotics:  See below   Anti-infectives    Start     Dose/Rate Route Frequency Ordered Stop   10/16/14 2300  vancomycin (VANCOCIN) IVPB 1000 mg/200 mL premix     1,000 mg 200 mL/hr over 60 Minutes Intravenous 3 times per day 10/16/14 1646     10/14/14 0200  vancomycin (VANCOCIN) IVPB 1000 mg/200 mL premix  Status:  Discontinued     1,000 mg 200 mL/hr over 60 Minutes Intravenous Every 12  hours 10/13/14 1323 10/16/14 1645   10/13/14 1330  vancomycin (VANCOCIN) IVPB 1000 mg/200 mL premix     1,000 mg 200 mL/hr over 60 Minutes Intravenous STAT 10/13/14 1323 10/13/14 1504   10/13/14 0745  meropenem (MERREM) 1 g in sodium chloride 0.9 % 100 mL IVPB     1 g 200 mL/hr over 30 Minutes Intravenous 3 times per day 10/13/14 0739     10/13/14 0600  ciprofloxacin (CIPRO) IVPB 400 mg  Status:  Discontinued     400 mg 200 mL/hr over 60 Minutes Intravenous Every 12 hours 10/12/14 2052 10/13/14 0739   10/12/14 2130  doxycycline (VIBRAMYCIN) 100 mg in dextrose 5 % 250 mL IVPB  Status:  Discontinued     100 mg 125 mL/hr over 120 Minutes Intravenous Every 12 hours 10/12/14 2037 10/13/14 0909   10/12/14 2045  metroNIDAZOLE (FLAGYL) IVPB 500 mg  Status:  Discontinued     500 mg 100 mL/hr over 60 Minutes Intravenous Every 8 hours 10/12/14 2033 10/13/14 0739   10/12/14 1800  vancomycin (VANCOCIN) 2,000 mg in sodium chloride 0.9 % 500 mL IVPB     2,000 mg 250 mL/hr over 120 Minutes Intravenous  Once 10/12/14 1736 10/12/14 2040   10/12/14 1745  ciprofloxacin (CIPRO) IVPB 400 mg     400 mg 200 mL/hr over 60 Minutes Intravenous  Once 10/12/14 1736 10/12/14 1946   10/12/14 1745  metroNIDAZOLE (FLAGYL) IVPB 500 mg     500 mg 100 mL/hr over 60 Minutes Intravenous  Once 10/12/14 1736 10/12/14 1904      DVT Prophylaxis: On IV heparin  Code  Status: Full code   Family Communication None at bedside  Procedures:  None  CONSULTS:  cardiology and general surgery  Time spent 40 minutes-which includes 50% of the time with face-to-face with patient/ family and coordinating care related to the above assessment and plan.  MEDICATIONS: Scheduled Meds: . antiseptic oral rinse  7 mL Mouth Rinse BID  . aspirin  81 mg Oral Daily  . Chlorhexidine Gluconate Cloth  6 each Topical Q0600  . HYDROmorphone PCA 0.3 mg/mL   Intravenous 6 times per day  . insulin aspart  0-15 Units Subcutaneous 4 times  per day  . meropenem (MERREM) IV  1 g Intravenous 3 times per day  . metoprolol  2.5 mg Intravenous 4 times per day  . mupirocin ointment  1 application Nasal BID  . pantoprazole (PROTONIX) IV  40 mg Intravenous Q12H  . vancomycin  1,000 mg Intravenous 3 times per day   Continuous Infusions: . 0.9 % NaCl with KCl 20 mEq / L 100 mL/hr at 10/19/14 1326  . heparin 2,800 Units/hr (10/19/14 0838)  . lactated ringers 10 mL/hr at 10/15/14 1313   PRN Meds:.acetaminophen **OR** acetaminophen, alum & mag hydroxide-simeth, diphenhydrAMINE **OR** diphenhydrAMINE, hydrALAZINE, HYDROmorphone (DILAUDID) injection, morphine injection, naloxone **AND** sodium chloride, [DISCONTINUED] ondansetron **OR** ondansetron (ZOFRAN) IV, ondansetron **OR** ondansetron (ZOFRAN) IV, ondansetron (ZOFRAN) IV, oxyCODONE-acetaminophen    PHYSICAL EXAM: Vital signs in last 24 hours: Filed Vitals:   10/19/14 0754 10/19/14 1002 10/19/14 1003 10/19/14 1136  BP:    154/63  Pulse:    81  Temp:    98.9 F (37.2 C)  TempSrc:    Oral  Resp: 26 14 14 17   Height:      Weight:      SpO2: 97% 96% 95% 98%    Weight change: -0.091 kg (-3.2 oz) Filed Weights   10/15/14 0546 10/18/14 0640 10/19/14 0400  Weight: 109.5 kg (241 lb 6.5 oz) 114.533 kg (252 lb 8 oz) 114.443 kg (252 lb 4.8 oz)   Body mass index is 32.38 kg/(m^2).   Gen Exam: Awake and alert with clear speech.   Neck: Supple, No JVD.   Chest: B/L Clear.   CVS: S1 S2 Regular, no murmurs.  Abdomen: soft, BS are very sluggish, midline incision with dressing-some distention Extremities: no edema, lower extremities warm to touch. Neurologic: Non Focal.   Skin: No Rash.   Wounds: N/A.    Intake/Output from previous day:  Intake/Output Summary (Last 24 hours) at 10/19/14 1407 Last data filed at 10/19/14 1208  Gross per 24 hour  Intake 2665.19 ml  Output   2530 ml  Net 135.19 ml     LAB RESULTS: CBC  Recent Labs Lab 10/15/14 0645 10/16/14 0444  10/17/14 0325 10/18/14 0533 10/19/14 0446  WBC 12.7* 13.4* 14.1* 13.6* 9.9  HGB 10.4* 10.9* 9.9* 10.3* 9.6*  HCT 33.4* 34.1* 31.7* 32.2* 33.3*  PLT 246 317 322 344 340  MCV 92.5 93.2 94.9 93.1 95.7  MCH 28.8 29.8 29.6 29.8 27.6  MCHC 31.1 32.0 31.2 32.0 28.8*  RDW 12.7 13.0 12.9 13.0 13.2    Chemistries   Recent Labs Lab 10/14/14 0503 10/15/14 0645 10/16/14 0444 10/17/14 0325 10/18/14 0533  NA 135 136 137 136 139  K 3.6 3.6 4.3 4.3 4.1  CL 103 105 104 103 107  CO2 24 23 28 30 23   GLUCOSE 171* 140* 197* 155* 155*  BUN 9 10 8 7  5*  CREATININE 1.06 0.98 1.03 0.99 0.79  CALCIUM 8.4 8.1* 7.9* 7.9* 7.9*    CBG:  Recent Labs Lab 10/18/14 1138 10/18/14 1620 10/19/14 0019 10/19/14 0559 10/19/14 1134  GLUCAP 135* 160* 143* 142* 151*    GFR Estimated Creatinine Clearance: 135.4 mL/min (by C-G formula based on Cr of 0.79).  Coagulation profile  Recent Labs Lab 10/13/14 0515  INR 1.21    Cardiac Enzymes  Recent Labs Lab 10/17/14 1535 10/17/14 1943 10/17/14 2322  TROPONINI 0.04* 0.04* 0.18*    Invalid input(s): POCBNP No results for input(s): DDIMER in the last 72 hours. No results for input(s): HGBA1C in the last 72 hours. No results for input(s): CHOL, HDL, LDLCALC, TRIG, CHOLHDL, LDLDIRECT in the last 72 hours.  Recent Labs  10/17/14 1535  TSH 1.067   No results for input(s): VITAMINB12, FOLATE, FERRITIN, TIBC, IRON, RETICCTPCT in the last 72 hours. No results for input(s): LIPASE, AMYLASE in the last 72 hours.  Urine Studies No results for input(s): UHGB, CRYS in the last 72 hours.  Invalid input(s): UACOL, UAPR, USPG, UPH, UTP, UGL, UKET, UBIL, UNIT, UROB, ULEU, UEPI, UWBC, URBC, UBAC, CAST, UCOM, BILUA  MICROBIOLOGY: Recent Results (from the past 240 hour(s))  Blood culture (routine x 2)     Status: None   Collection Time: 10/12/14  5:46 PM  Result Value Ref Range Status   Specimen Description BLOOD ARM RIGHT  Final   Special Requests  BOTTLES DRAWN AEROBIC AND ANAEROBIC 5CC  Final   Culture   Final    NO GROWTH 5 DAYS Performed at [facility]    Report Status 10/19/2014 FINAL  Final  Blood culture (routine x 2)     Status: None   Collection Time: 10/12/14  5:52 PM  Result Value Ref Range Status   Specimen Description BLOOD ARM LEFT  Final   Special Requests BOTTLES DRAWN AEROBIC AND ANAEROBIC 5CC  Final   Culture   Final    NO GROWTH 5 DAYS Performed at [facility]    Report Status 10/19/2014 FINAL  Final  Culture, blood (routine x 2)     Status: None (Preliminary result)   Collection Time: 10/13/14  5:35 PM  Result Value Ref Range Status   Specimen Description BLOOD LEFT ARM  Final   Special Requests BOTTLES DRAWN AEROBIC AND ANAEROBIC 10 CC EACH  Final   Culture   Final           BLOOD CULTURE RECEIVED NO GROWTH TO DATE CULTURE WILL BE HELD FOR 5 DAYS BEFORE ISSUING A FINAL NEGATIVE REPORT Note: Culture results may be compromised due to an excessive volume of blood received in culture bottles. Performed at [facility]    Report Status PENDING  Incomplete  Culture, blood (routine x 2)     Status: None (Preliminary result)   Collection Time: 10/13/14  5:41 PM  Result Value Ref Range Status   Specimen Description BLOOD LEFT ARM  Final   Special Requests BOTTLES DRAWN AEROBIC AND ANAEROBIC 10 CC EACH  Final   Culture   Final           BLOOD CULTURE RECEIVED NO GROWTH TO DATE CULTURE WILL BE HELD FOR 5 DAYS BEFORE ISSUING A FINAL NEGATIVE REPORT Note: Culture results may be compromised due to an excessive volume of blood received in culture bottles. Performed at [facility]    Report Status PENDING  Incomplete  Surgical pcr screen     Status: Abnormal   Collection Time: 10/15/14  11:53 AM  Result Value Ref Range Status   MRSA, PCR POSITIVE (A) NEGATIVE Final   Staphylococcus aureus POSITIVE (A) NEGATIVE Final    Comment:        The Xpert SA Assay (FDA approved for NASAL  specimens in patients over 21 years of age), is one component of a comprehensive surveillance program.  Test performance has been validated by [facility] for patients greater than or equal to 58 year old. It is not intended to diagnose infection nor to guide or monitor treatment.     RADIOLOGY STUDIES/RESULTS: Ct Abdomen Pelvis W Contrast  10/12/2014   CLINICAL DATA:  59yom, lower abd pain, n/v/d, fever, and cysts on buttocks and in his groin. Past medical history includes DM.  EXAM: CT ABDOMEN AND PELVIS WITH CONTRAST  TECHNIQUE: Multidetector CT imaging of the abdomen and pelvis was performed using the standard protocol following bolus administration of intravenous contrast.  CONTRAST:  OMNIPAQUE IOHEXOL 300 MG/ML  SOLN  COMPARISON:  03/24/2006  FINDINGS: There is a heterogeneous collection contiguous with the posterior margin of the mid sigmoid colon measuring 4 cm x 3.3 cm x 3.6 cm. Is contiguous with an area of inflammatory changes small extraluminal bubbles of air that tracks along the course of the central left iliac vessels. The contiguous sigmoid colon shows mild wall thickening and multiple diverticula. Hazy inflammatory changes seen in the adjacent fat. Findings are most consistent with a very diverticular abscess.  There are additional scattered left colon diverticula without other evidence of diverticulitis. Remainder of the colon is unremarkable. Normal appendix is visualized. Normal small bowel.  There are prominent inguinal and external iliac chain lymph nodes. Largest measure 1 cm short axis. No other adenopathy. No ascites.  Lung bases essentially clear. Liver shows diffuse fatty infiltration. No liver mass or focal lesion.  Spleen, gallbladder, pancreas, adrenal glands, kidneys, ureters and bladder: Unremarkable.  Mild degenerative changes noted of the visualized spine. No osteoblastic or osteolytic lesions.  IMPRESSION: 1. Sigmoid colon diverticulitis with a very  diverticular abscess measuring 4 cm in greatest dimension and a small amount of adjacent, contained, extraluminal air. 2. No other acute abnormalities. 3. Hepatic steatosis.   Electronically Signed   By: Amie Portland M.D.   On: 10/12/2014 17:15    Jeoffrey Massed, MD  [facility] Pager:[phone]  If 7PM-7AM, please contact night-coverage [email or web address] Password [email or web address] 10/19/2014, 2:07 PM   LOS: 7 days

## 2014-10-19 NOTE — Consult Note (Addendum)
WOC ostomy follow up Stoma type/location:  Colostomy to LLQ from surgery on 3/31 Stomal assessment/size: Stoma red and viable, flush with skin level, 11/2 inches and oval Peristomal assessment: Intact skin surrounding. Output: No stool or flatus, small amt blood-tinged liquid in pouch  Ostomy pouching: 1pc. Education provided:  Demonstrated pouch application using barrier ring to maintain seal and one piece pouch.  Wife assisted with pouch application and pt asked appropriate questions and watched procedure.  Educational materials left at bedside and supplies in room for staff nurse use.  Pt and wife are able to open and close velcro to empty.  Discussed ordering supplies and pouching routines. Will continue to follow for further teaching sessions.   Enrolled patient in [facility] Discharge program: Yes Cammie Mcgeeawn Doralyn Kirkes MSN, RN, [place], [place]AP, [place] [phone]

## 2014-10-19 NOTE — Progress Notes (Signed)
PT Cancellation Note  Patient Details Name: Verda Cuminsnthony D Kostelnik MRN: [id number] DOB: 03/27/1956   Cancelled Treatment:    Reason Eval/Treat Not Completed: Fatigue/lethargy limiting ability to participate.  Pt c/o fatigue and pain making him unable to participate at this time.  Will f/u tomorrow as pt requests.     Karlea Mckibbin, Alison MurrayMegan F 10/19/2014, 1:25 PM

## 2014-10-19 NOTE — Progress Notes (Signed)
ANTICOAGULATION CONSULT NOTE  - Follow Up Note  Pharmacy Consult for Heparin Indication: atrial fibrillation  Allergies  Allergen Reactions  . Penicillins Swelling    Patient Measurements: Height: 6\' 2"  (188 cm) Weight: 252 lb 4.8 oz (114.443 kg) IBW/kg (Calculated) : 82.2 Heparin Dosing Weight: 102 kg  Vital Signs: Temp: 98.9 F (37.2 C) (04/04 1136) Temp Source: Oral (04/04 1136) BP: 154/63 mmHg (04/04 1136) Pulse Rate: 81 (04/04 1136)  Labs:  Recent Labs  10/17/14 0325 10/17/14 1535  10/17/14 1943 10/17/14 2322 10/18/14 0533  10/18/14 2306 10/19/14 0446 10/19/14 1227  HGB 9.9*  --   --   --   --  10.3*  --   --  9.6*  --   HCT 31.7*  --   --   --   --  32.2*  --   --  33.3*  --   PLT 322  --   --   --   --  344  --   --  340  --   HEPARINUNFRC  --   --   < >  --   --  <0.10*  < > 0.31 0.21* 0.52  CREATININE 0.99  --   --   --   --  0.79  --   --   --   --   TROPONINI 0.07* 0.04*  --  0.04* 0.18*  --   --   --   --   --   < > = values in this interval not displayed.  Estimated Creatinine Clearance: 135.4 mL/min (by C-G formula based on Cr of 0.79).  Assessment: 59 yo M with new onset afib noted this admission.  Pt is s/p sigmoid colectomy with colostomy placement and drainage of retroperitoneal abscess on 3/31.  Pt was started on IV heparin 4/2.  CHADS2 VAS of 1  Heparin level is now therapeutic on 2800 units/hr.    Goal of Therapy:  Heparin level 0.3-0.7 units/ml Monitor platelets by anticoagulation protocol: Yes   Plan:  Continue heparin drip to 2800 units/hr  Daily heparin level  And CBC Monitor s/sx of bleeding  Toys 'R' UsKimberly Rola Lennon, Pharm.D., BCPS Clinical Pharmacist Pager [phone] 10/19/2014 2:21 PM

## 2014-10-19 NOTE — Progress Notes (Addendum)
NUTRITION FOLLOW UP  Intervention:   -RD to follow for diet advancement -Supplement diet as appropriate -If unable to advance diet within the next 72 hours, consider initiation of nutrition support  Nutrition Dx:   Inadequate oral intake related to inability to eat as evidenced by NPO; ongoing  Goal:   Pt will meet >90% of estimated nutritional needs; unmet  Monitor:   Diet advancement, PO intake, labs, weight changes, I/O's  Assessment:   Leroy Chen is a 59 y.o. male with Past medical history of diabetes mellitus, history of hidradenitis, recurrent skin infections. The patient is presenting with complaints of left groin pain as well as multiple skin lesions. He mentions that he has noted swelling in his right groin as well as a few swellings on his anal area 2 weeks ago. There was no pain. Later on 3-4 days ago this swelling started draining.   S/p Procedure on 10/15/14:  Exploratory laparotomy, sigmoid colectomy, end colostomy, drainage of retroperitoneal abscess  Per MD notes, pt with slow to resolve ileus. Pt feeling bloated this AM.  Pt with LLQ colostomy- 0 ml output per doc flowsheets. Noted 20-60 ml output from JP drain. Pt remains NPO, but is receiving sips of clears from the floor. Pt has been NPO since admission (10 days).  Labs reviewed BUN: 5, Calcium: 7.9, Glucose: 155, CBGS: 142-160.   Height: Ht Readings from Last 1 Encounters:  10/12/14 6\' 2"  (1.88 m)    Weight Status:   Wt Readings from Last 1 Encounters:  10/19/14 252 lb 4.8 oz (114.443 kg)   10/13/14 249 lb 5.4 oz (113.1 kg)       Re-estimated needs:  Kcal: 2200-2400 Protein: 120-130 grams Fluid: 2.2-2.4 L   Skin: closed abdominal incision, JP drain to rt abdomen, LLQ colostomy  Diet Order: Diet NPO time specified Except for: Ice Chips, Sips with Meds, Other (See Comments)   Intake/Output Summary (Last 24 hours) at 10/19/14 1010 Last data filed at 10/19/14 0946  Gross  per 24 hour  Intake 2665.19 ml  Output   1930 ml  Net 735.19 ml    Last BM: PTA   Labs:   Recent Labs Lab 10/16/14 0444 10/17/14 0325 10/18/14 0533  NA 137 136 139  K 4.3 4.3 4.1  CL 104 103 107  CO2 28 30 23   BUN 8 7 5*  CREATININE 1.03 0.99 0.79  CALCIUM 7.9* 7.9* 7.9*  GLUCOSE 197* 155* 155*    CBG (last 3)   Recent Labs  10/18/14 1620 10/19/14 0019 10/19/14 0559  GLUCAP 160* 143* 142*    Scheduled Meds: . antiseptic oral rinse  7 mL Mouth Rinse BID  . aspirin  81 mg Oral Daily  . Chlorhexidine Gluconate Cloth  6 each Topical Q0600  . HYDROmorphone PCA 0.3 mg/mL   Intravenous 6 times per day  . insulin aspart  0-15 Units Subcutaneous 4 times per day  . meropenem (MERREM) IV  1 g Intravenous 3 times per day  . mupirocin ointment  1 application Nasal BID  . pantoprazole (PROTONIX) IV  40 mg Intravenous Q12H  . vancomycin  1,000 mg Intravenous 3 times per day    Continuous Infusions: . 0.9 % NaCl with KCl 20 mEq / L 100 mL/hr at 10/18/14 1615  . diltiazem (CARDIZEM) infusion 10 mg/hr (10/19/14 0042)  . heparin 2,800 Units/hr (10/19/14 16100838)  . lactated ringers 10 mL/hr at 10/15/14 1313    Leroy Siegman A. Mayford KnifeWilliams, RD, LDN, CDE Pager:  [phone] After hours Pager: [phone]

## 2014-10-19 NOTE — Progress Notes (Signed)
    Subjective:  Still NPO, uncomfortable secondary to recent surgery. No Awareness of AF.   Objective:  Vital Signs in the last 24 hours: Temp:  [98.9 F (37.2 C)-99.6 F (37.6 C)] 98.9 F (37.2 C) (04/04 0400) Pulse Rate:  [78-82] 78 (04/04 0400) Resp:  [14-28] 14 (04/04 1003) BP: (96-153)/(56-77) 137/66 mmHg (04/04 0400) SpO2:  [94 %-98 %] 95 % (04/04 1003) FiO2 (%):  [0 %-98 %] 98 % (04/04 0414) Weight:  [252 lb 4.8 oz (114.443 kg)] 252 lb 4.8 oz (114.443 kg) (04/04 0400)  Intake/Output from previous day:  Intake/Output Summary (Last 24 hours) at 10/19/14 1014 Last data filed at 10/19/14 0946  Gross per 24 hour  Intake 2665.19 ml  Output   1930 ml  Net 735.19 ml    Physical Exam: General appearance: alert, cooperative, no distress and moderately obese Lungs: decreased breath sounds Heart: regular rate and rhythm Abdomen: distended, BS quiet, NG on place   Rate: 78  Rhythm: normal sinus rhythm  Lab Results:  Recent Labs  10/18/14 0533 10/19/14 0446  WBC 13.6* 9.9  HGB 10.3* 9.6*  PLT 344 340    Recent Labs  10/17/14 0325 10/18/14 0533  NA 136 139  K 4.3 4.1  CL 103 107  CO2 30 23  GLUCOSE 155* 155*  BUN 7 5*  CREATININE 0.99 0.79    Recent Labs  10/17/14 1943 10/17/14 2322  TROPONINI 0.04* 0.18*   No results for input(s): INR in the last 72 hours.  Imaging: Imaging results have been reviewed  Cardiac Studies: Echo pending Assessment/Plan:  59 y.o. male with a past medical history significant for DM and smoking. He was admitted 10/12/14 with a diverticular abscess. He underwent exp lap and sigmoid colectomy with colostomy 10/15/14. Post op he was noted to be in AF with RVR and we are asked to consult. Review of the pt's records and EKGs show he was in AF in March 3015. He is on no rate control medications prior to admission- just ASA.  His CHADSVASc score is 1 for DM. He apprently is unaware of his AF. Now he is in NSR.    Principal  Problem:   Colonic diverticular abscess- s/p colectomy 10/15/14 Active Problems:   Atrial fibrillation with RVR   Diabetes mellitus, type 2   Smoker   Demand ischemia from AF with RVR-Troponin 0.07   PLAN: DC IV Diltiazem, change to 2.5 mg IV Lopressor Q 6. Echo today. If his EF is normal he may need need IV Heparin.   Corine ShelterLuke Kilroy PA-C Beeper [phone] 10/19/2014, 10:14 AM   I have seen and examined the patient along with Corine ShelterLuke Kilroy PA-C.  I have reviewed the chart, notes and new data.  I agree with PA's note.  Key new complaints: no cardiac symptoms Key examination changes: very quiet bowel sounds Key new findings / data: NSR  PLAN: Awaiting echo. Absent major valvular abnormalities or depressed LVEF, favor ASA long-term for CVA prevention. If LVEF is low or there are serious mitral valve abnormalities, will recommend anticoagulants.  Thurmon FairMihai Chakara Bognar, MD, [facility] LLPFACC [facility] [phone] 10/19/2014, 10:44 AM

## 2014-10-19 NOTE — Progress Notes (Signed)
*  PRELIMINARY RESULTS* Echocardiogram 2D Echocardiogram has been performed.  Jeryl ColumbiaLLIOTT, Meleane Selinger 10/19/2014, 2:34 PM

## 2014-10-20 LAB — GLUCOSE, CAPILLARY
GLUCOSE-CAPILLARY: 136 mg/dL — AB (ref 70–99)
GLUCOSE-CAPILLARY: 144 mg/dL — AB (ref 70–99)
Glucose-Capillary: 113 mg/dL — ABNORMAL HIGH (ref 70–99)
Glucose-Capillary: 119 mg/dL — ABNORMAL HIGH (ref 70–99)
Glucose-Capillary: 134 mg/dL — ABNORMAL HIGH (ref 70–99)

## 2014-10-20 LAB — BASIC METABOLIC PANEL
ANION GAP: 7 (ref 5–15)
BUN: <5 mg/dL — ABNORMAL LOW (ref 6–23)
CO2: 26 mmol/L (ref 19–32)
Calcium: 8.4 mg/dL (ref 8.4–10.5)
Chloride: 101 mmol/L (ref 96–112)
Creatinine, Ser: 0.85 mg/dL (ref 0.50–1.35)
GFR calc non Af Amer: >90 mL/min (ref >90)
Glucose, Bld: 131 mg/dL — ABNORMAL HIGH (ref 70–99)
POTASSIUM: 3.7 mmol/L (ref 3.5–5.1)
Sodium: 134 mmol/L — ABNORMAL LOW (ref 135–145)

## 2014-10-20 LAB — CULTURE, BLOOD (ROUTINE X 2)
Culture: NO GROWTH
Culture: NO GROWTH

## 2014-10-20 LAB — CBC
HCT: 29.7 % — ABNORMAL LOW (ref 39.0–52.0)
HEMOGLOBIN: 9.4 g/dL — AB (ref 13.0–17.0)
MCH: 29.7 pg (ref 26.0–34.0)
MCHC: 31.6 g/dL (ref 30.0–36.0)
MCV: 93.7 fL (ref 78.0–100.0)
PLATELETS: 415 10*3/uL — AB (ref 150–400)
RBC: 3.17 MIL/uL — AB (ref 4.22–5.81)
RDW: 13.1 % (ref 11.5–15.5)
WBC: 10 10*3/uL (ref 4.0–10.5)

## 2014-10-20 LAB — HEPARIN LEVEL (UNFRACTIONATED): HEPARIN UNFRACTIONATED: 0.56 [IU]/mL (ref 0.30–0.70)

## 2014-10-20 MED ORDER — HEPARIN SODIUM (PORCINE) 5000 UNIT/ML IJ SOLN
5000.0000 [IU] | Freq: Three times a day (TID) | INTRAMUSCULAR | Status: DC
  Administered 2014-10-20 – 2014-10-24 (×11): 5000 [IU] via SUBCUTANEOUS
  Filled 2014-10-20 (×13): qty 1

## 2014-10-20 NOTE — Progress Notes (Signed)
Utilization review completed.  

## 2014-10-20 NOTE — Progress Notes (Signed)
Patient ID: Leroy Chen, male   DOB: 1955/12/23, 59 y.o.   MRN: [id number] 5 Days Post-Op  Subjective: Pt still c/o bloating.  He is not mobilizing nearly enough.  No nausea with sips of liquids  Objective: Vital signs in last 24 hours: Temp:  [98.4 F (36.9 C)-98.9 F (37.2 C)] 98.9 F (37.2 C) (04/05 0529) Pulse Rate:  [76-86] 85 (04/05 0529) Resp:  [14-24] 22 (04/05 0529) BP: (154-165)/(63-78) 159/70 mmHg (04/05 0529) SpO2:  [95 %-99 %] 96 % (04/05 0529) Weight:  [114.9 kg (253 lb 4.9 oz)] 114.9 kg (253 lb 4.9 oz) (04/05 0529) Last BM Date:  (prior to surgery)  Intake/Output from previous day: 04/04 0701 - 04/05 0700 In: 0  Out: 4370 [Urine:4250; Drains:120] Intake/Output this shift: Total I/O In: 0  Out: 430 [Urine:400; Drains:30]  PE: Abd: soft, but still bloated and distended, +BS, appropriately tender, wound is clean and fascia is intact.  Ostomy with no output.  Stoma is viable  Lab Results:   Recent Labs  10/19/14 0446 10/20/14 0555  WBC 9.9 10.0  HGB 9.6* 9.4*  HCT 33.3* 29.7*  PLT 340 415*   BMET  Recent Labs  10/18/14 0533 10/20/14 0555  NA 139 134*  K 4.1 3.7  CL 107 101  CO2 23 26  GLUCOSE 155* 131*  BUN 5* <5*  CREATININE 0.79 0.85  CALCIUM 7.9* 8.4   PT/INR No results for input(s): LABPROT, INR in the last 72 hours. CMP     Component Value Date/Time   NA 134* 10/20/2014 0555   K 3.7 10/20/2014 0555   CL 101 10/20/2014 0555   CO2 26 10/20/2014 0555   GLUCOSE 131* 10/20/2014 0555   BUN <5* 10/20/2014 0555   CREATININE 0.85 10/20/2014 0555   CALCIUM 8.4 10/20/2014 0555   PROT 7.3 10/13/2014 0515   ALBUMIN 2.9* 10/13/2014 0515   AST 15 10/13/2014 0515   ALT 15 10/13/2014 0515   ALKPHOS 55 10/13/2014 0515   BILITOT 1.8* 10/13/2014 0515   GFRNONAA >90 10/20/2014 0555   GFRAA >90 10/20/2014 0555   Lipase  No results found for: LIPASE     Studies/Results: No results found.  Anti-infectives: Anti-infectives    Start      Dose/Rate Route Frequency Ordered Stop   10/16/14 2300  vancomycin (VANCOCIN) IVPB 1000 mg/200 mL premix     1,000 mg 200 mL/hr over 60 Minutes Intravenous 3 times per day 10/16/14 1646     10/14/14 0200  vancomycin (VANCOCIN) IVPB 1000 mg/200 mL premix  Status:  Discontinued     1,000 mg 200 mL/hr over 60 Minutes Intravenous Every 12 hours 10/13/14 1323 10/16/14 1645   10/13/14 1330  vancomycin (VANCOCIN) IVPB 1000 mg/200 mL premix     1,000 mg 200 mL/hr over 60 Minutes Intravenous STAT 10/13/14 1323 10/13/14 1504   10/13/14 0745  meropenem (MERREM) 1 g in sodium chloride 0.9 % 100 mL IVPB     1 g 200 mL/hr over 30 Minutes Intravenous 3 times per day 10/13/14 0739     10/13/14 0600  ciprofloxacin (CIPRO) IVPB 400 mg  Status:  Discontinued     400 mg 200 mL/hr over 60 Minutes Intravenous Every 12 hours 10/12/14 2052 10/13/14 0739   10/12/14 2130  doxycycline (VIBRAMYCIN) 100 mg in dextrose 5 % 250 mL IVPB  Status:  Discontinued     100 mg 125 mL/hr over 120 Minutes Intravenous Every 12 hours 10/12/14 2037 10/13/14 0909  10/12/14 2045  metroNIDAZOLE (FLAGYL) IVPB 500 mg  Status:  Discontinued     500 mg 100 mL/hr over 60 Minutes Intravenous Every 8 hours 10/12/14 2033 10/13/14 0739   10/12/14 1800  vancomycin (VANCOCIN) 2,000 mg in sodium chloride 0.9 % 500 mL IVPB     2,000 mg 250 mL/hr over 120 Minutes Intravenous  Once 10/12/14 1736 10/12/14 2040   10/12/14 1745  ciprofloxacin (CIPRO) IVPB 400 mg     400 mg 200 mL/hr over 60 Minutes Intravenous  Once 10/12/14 1736 10/12/14 1946   10/12/14 1745  metroNIDAZOLE (FLAGYL) IVPB 500 mg     500 mg 100 mL/hr over 60 Minutes Intravenous  Once 10/12/14 1736 10/12/14 1904       Assessment/Plan POD 5  s/p Hartmann procedure for diverticulitis with abscess - 10/15/2014 - Derrell LollingIngram -NPO, but may have sips of clears from the floor. Await bowel function -ambulate in the halls TID!!, PT ordered.  Patient using his IV pumps  as an excuse for why he can't walk yet. - WD dressing changes BID - WOC consult DVT prophylaxis -heparin drip for new onset a fib.  If this is discontinued, he will just need to be switched to heparin TID, SQ New onset A fib  -per primary service -now in NSR  LOS: 8 days    OSBORNE,KELLY E 10/20/2014, 8:33 AM Pager: [phone]  Agree with above. RLQ drain 120 cc recorded yesterday The patient is not ambulating for various reasons.  Nursing understands the importance of ambulation.  Ovidio Kinavid Jajaira Ruis, MD, [facility] Pager: [phone] Office phone:  [phone]

## 2014-10-20 NOTE — Progress Notes (Signed)
Physical Therapy Treatment and Discharge Patient Details Name: Leroy Chen MRN: [id number] DOB: January 02, 1956 Today's Date: 10/20/2014    History of Present Illness 59 y/o male with PMH of HTN, DM, HPL, history of hidradenitis, recurrent skin infections was admitted with abdominal pain secondary to diverticular abscess, seen by general surgery initially was treated with conservative management and subsequently taken to the OR on 10/15/2014 for sigmoid resection with colostomy. He is on empiric IV antibiotics. He developed A Fib RVR on 4/2 AM    PT Comments    Pt without dizziness and ambulating without physical assistance and no loss of balance. No further PT or DME needs. Pt reporting "pulling" type pain at ostomy site (especially with walking; ostomy bag was empty when up) and all other positions. Julien Girt, RN, [place] notified. She recommended abd binder be ordered and will see pt tomorrow morning. RN aware and to order binder.    Follow Up Recommendations  Supervision - Intermittent;No PT follow up     Equipment Recommendations  None recommended by PT    Recommendations for Other Services OT consult     Precautions / Restrictions Precautions Precautions: Other (comment) (new colostomy with painful abd) Precaution Comments: educated on methods to lessen strain on abd/protect abd Required Braces or Orthoses: Other Brace/Splint Other Brace/Splint: spoke to [place], RN who recommended abd binder; pt's RN agreed to order Restrictions Weight Bearing Restrictions: No    Mobility  Bed Mobility Overal bed mobility: Needs Assistance Bed Mobility: Rolling;Sidelying to Sit;Sit to Sidelying Rolling: Supervision Sidelying to sit: Supervision     Sit to sidelying: Supervision General bed mobility comments: education re: protecting abd during healing; vc for technique  Transfers Overall transfer level: Independent Equipment used: None Transfers: Sit to/from Stand Sit to Stand:  Independent         General transfer comment: x4 various surfaces with no LOB  Ambulation/Gait Ambulation/Gait assistance: Supervision;+2 safety/equipment Ambulation Distance (Feet): 100 Feet Assistive device: None (holding IV & progressed to no UE support) Gait Pattern/deviations: WFL(Within Functional Limits)     General Gait Details: denied dizziness; no LOB   Stairs            Wheelchair Mobility    Modified Rankin (Stroke Patients Only)       Balance     Sitting balance-Leahy Scale: Good       Standing balance-Leahy Scale: Good                      Cognition Arousal/Alertness: Awake/alert Behavior During Therapy: Impulsive;Restless Overall Cognitive Status: Impaired/Different from baseline Area of Impairment: Safety/judgement         Safety/Judgement: Decreased awareness of safety (walked from bathroom to bed by himself with IV pole, drain)          Exercises      General Comments General comments (skin integrity, edema, etc.): incr time discussion sensation of colostomy "pulling" and cannot get comfortable in any position; assisted with sidelying in bed with pillows to support abd with minimal relief; see comments re: f/u with WOC, Rn who will visit pt tomorrow      Pertinent Vitals/Pain Pain Assessment: 0-10 Pain Score: 7  Pain Location: colostomy site Pain Intervention(s): Limited activity within patient's tolerance;Monitored during session;Repositioned;PCA encouraged    Home Living                      Prior Function  PT Goals (current goals can now be found in the care plan section) Acute Rehab PT Goals Patient Stated Goal: Go home and back to work PT Goal Formulation: With patient Time For Goal Achievement: 11/01/14 Potential to Achieve Goals: Good Progress towards PT goals: Goals met/education completed, patient discharged from PT    Frequency       PT Plan Discharge plan needs to be  updated;Other (comment) (d/c PT)    Co-evaluation             End of Session      Patient is being discharged from PT services secondary to:  Goals met and no further therapy needs identified.    Please see latest Therapy Progress Note for current level of functioning and progress toward goals.  Progress and discharge plan and discussed with patient/caregiver and they  Agree    Activity Tolerance: Patient tolerated treatment well Patient left: with call bell/phone within reach;with SCD's reapplied;in bed;with family/visitor present        Time: 0947-0962 PT Time Calculation (min) (ACUTE ONLY): 28 min  Charges:  $Gait Training: 8-22 mins $Therapeutic Activity: 8-22 mins                    G Codes:      Bowe Sidor 10-21-14, 2:17 PM Pager [phone]

## 2014-10-20 NOTE — Progress Notes (Signed)
    Subjective:  Still NPO  Objective:  Vital Signs in the last 24 hours: Temp:  [98.4 F (36.9 C)-98.9 F (37.2 C)] 98.9 F (37.2 C) (04/05 0529) Pulse Rate:  [76-86] 85 (04/05 0529) Resp:  [14-24] 22 (04/05 0529) BP: (154-165)/(63-78) 159/70 mmHg (04/05 0529) SpO2:  [95 %-99 %] 96 % (04/05 0529) Weight:  [253 lb 4.9 oz (114.9 kg)] 253 lb 4.9 oz (114.9 kg) (04/05 0529)  Intake/Output from previous day:  Intake/Output Summary (Last 24 hours) at 10/20/14 0836 Last data filed at 10/20/14 0829  Gross per 24 hour  Intake      0 ml  Output   4800 ml  Net  -4800 ml    Physical Exam: General appearance: alert, cooperative, no distress and moderately obese Lungs: decreased breath sounds Heart: regular rate and rhythm Abdomen: distended, BS decreased   Rate: 80  Rhythm: normal sinus rhythm  Lab Results:  Recent Labs  10/19/14 0446 10/20/14 0555  WBC 9.9 10.0  HGB 9.6* 9.4*  PLT 340 415*    Recent Labs  10/18/14 0533 10/20/14 0555  NA 139 134*  K 4.1 3.7  CL 107 101  CO2 23 26  GLUCOSE 155* 131*  BUN 5* <5*  CREATININE 0.79 0.85    Recent Labs  10/17/14 1943 10/17/14 2322  TROPONINI 0.04* 0.18*   No results for input(s): INR in the last 72 hours.  Imaging: Imaging results have been reviewed  Cardiac Studies: Echo 10/19/14 Study Conclusions  - Left ventricle: The cavity size was normal. Wall thickness was increased in a pattern of mild LVH. Systolic function was normal. The estimated ejection fraction was in the range of 55% to 60%. Left ventricular diastolic function parameters were normal. - Left atrium: The atrium was mildly dilated  Assessment/Plan:  59 y.o. male with a past medical history significant for DM and smoking. He was admitted 10/12/14 with a diverticular abscess. He underwent exp lap and sigmoid colectomy with colostomy 10/15/14. Post op he was noted to be in AF with RVR and we are asked to consult. Review of the pt's records and  EKGs show he was in AF in March 3015. He is on no rate control medications prior to admission- just ASA. His CHADSVASc score is 1 for DM. He apprently is unaware of his AF. Now he is in NSR.   Principal Problem:   Colonic diverticular abscess- s/p colectomy 10/15/14 Active Problems:   Atrial fibrillation with RVR   Diabetes mellitus, type 2   Smoker   Demand ischemia from AF with RVR-Troponin 0.07   PLAN: LVF OK so he doesn't need Heparin, just ASA. Start PO meds when able. B/P running a little high- consider IV ACE.  Corine ShelterLuke Kilroy PA-C [facility] [phone] 10/20/2014, 8:36 AM   I have seen and examined the patient along with Corine ShelterLuke Kilroy PA-C.  I have reviewed the chart, notes and new data.  I agree with PA's note.   PLAN: I am not convinced he has HBP - he is uncomfortable and still ill. Will hold off antiHTNive meds for now. An ACEi PO will be a good choice for nephroprotection from DM if needed.  Thurmon FairMihai Jameia Makris, MD, [facility] [phone] 10/20/2014, 11:26 AM

## 2014-10-20 NOTE — Progress Notes (Signed)
PATIENT DETAILS Name: Leroy Chen Age: 59 y.o. Sex: male Date of Birth: Jun 02, 1956 Admit Date: 10/12/2014 Admitting Physician [name], MD NWG:[id number],QMVH Jomarie Longs, MD  Subjective: No major issues overnight-still has some pain at the operative site.   Assessment/Plan: Principal Problem:   Colonic diverticular abscess- s/p colectomy 10/15/14: Admitted and started on empiric antibiotics and other conservative treatment, unfortunately did not make any significant improvement with supportive measures, underwent Hartman's procedure on 10/15/14. Continues to be mostly NPO, abdomen slightly distended with some bowel sounds although sluggish.Currently awaiting return of bowel function. General surgery following. Will discuss with general surgery, regarding discontinuing antibiotics.  Active Problems:   Atrial fibrillation with RVR: Hospital stay complicated by development of atrial fibrillation with rapid ventricular response, started on IV Cardizem and IV heparin. Cardiology consulted, Cardizem has been discontinued, patient now on metoprolol IV. Will discontinue heparin as echocardiogram shows preserved LV function. Continue aspirin as CHADSVASc score is 1.     Diabetes mellitus, type 2: CBG's stable, continue with SSI for now, resume Amaryl when oral intake has resumed. Will plan on resuming metformin on discharge  Atrial fibrillation with RVR    Minimally elevated troponins: Likely secondary to demand ischemia in the setting of A. fib with RVR. Cardiology following. Await echo    Acute renal failure: Likely from prerenal azotemia. Resolved with supportive care.  Disposition: Remain inpatient  Antibiotics:  See below   Anti-infectives    Start     Dose/Rate Route Frequency Ordered Stop   10/16/14 2300  vancomycin (VANCOCIN) IVPB 1000 mg/200 mL premix     1,000 mg 200 mL/hr over 60 Minutes Intravenous 3 times per day 10/16/14 1646     10/14/14 0200  vancomycin  (VANCOCIN) IVPB 1000 mg/200 mL premix  Status:  Discontinued     1,000 mg 200 mL/hr over 60 Minutes Intravenous Every 12 hours 10/13/14 1323 10/16/14 1645   10/13/14 1330  vancomycin (VANCOCIN) IVPB 1000 mg/200 mL premix     1,000 mg 200 mL/hr over 60 Minutes Intravenous STAT 10/13/14 1323 10/13/14 1504   10/13/14 0745  meropenem (MERREM) 1 g in sodium chloride 0.9 % 100 mL IVPB     1 g 200 mL/hr over 30 Minutes Intravenous 3 times per day 10/13/14 0739     10/13/14 0600  ciprofloxacin (CIPRO) IVPB 400 mg  Status:  Discontinued     400 mg 200 mL/hr over 60 Minutes Intravenous Every 12 hours 10/12/14 2052 10/13/14 0739   10/12/14 2130  doxycycline (VIBRAMYCIN) 100 mg in dextrose 5 % 250 mL IVPB  Status:  Discontinued     100 mg 125 mL/hr over 120 Minutes Intravenous Every 12 hours 10/12/14 2037 10/13/14 0909   10/12/14 2045  metroNIDAZOLE (FLAGYL) IVPB 500 mg  Status:  Discontinued     500 mg 100 mL/hr over 60 Minutes Intravenous Every 8 hours 10/12/14 2033 10/13/14 0739   10/12/14 1800  vancomycin (VANCOCIN) 2,000 mg in sodium chloride 0.9 % 500 mL IVPB     2,000 mg 250 mL/hr over 120 Minutes Intravenous  Once 10/12/14 1736 10/12/14 2040   10/12/14 1745  ciprofloxacin (CIPRO) IVPB 400 mg     400 mg 200 mL/hr over 60 Minutes Intravenous  Once 10/12/14 1736 10/12/14 1946   10/12/14 1745  metroNIDAZOLE (FLAGYL) IVPB 500 mg     500 mg 100 mL/hr over 60 Minutes Intravenous  Once 10/12/14 1736 10/12/14 1904  DVT Prophylaxis: On IV heparin  Code Status: Full code   Family Communication None at bedside  Procedures:  None  CONSULTS:  cardiology and general surgery   MEDICATIONS: Scheduled Meds: . antiseptic oral rinse  7 mL Mouth Rinse BID  . aspirin  81 mg Oral Daily  . heparin subcutaneous  5,000 Units Subcutaneous 3 times per day  . HYDROmorphone PCA 0.3 mg/mL   Intravenous 6 times per day  . insulin aspart  0-15 Units Subcutaneous 4 times per day  . meropenem  (MERREM) IV  1 g Intravenous 3 times per day  . metoprolol  2.5 mg Intravenous 4 times per day  . pantoprazole (PROTONIX) IV  40 mg Intravenous Q12H  . vancomycin  1,000 mg Intravenous 3 times per day   Continuous Infusions: . 0.9 % NaCl with KCl 20 mEq / L 100 mL/hr at 10/19/14 2332  . lactated ringers 10 mL/hr at 10/15/14 1313   PRN Meds:.acetaminophen **OR** acetaminophen, alum & mag hydroxide-simeth, diphenhydrAMINE **OR** diphenhydrAMINE, hydrALAZINE, HYDROmorphone (DILAUDID) injection, morphine injection, naloxone **AND** sodium chloride, [DISCONTINUED] ondansetron **OR** ondansetron (ZOFRAN) IV, ondansetron **OR** ondansetron (ZOFRAN) IV, ondansetron (ZOFRAN) IV, oxyCODONE-acetaminophen    PHYSICAL EXAM: Vital signs in last 24 hours: Filed Vitals:   10/19/14 2000 10/19/14 2130 10/20/14 0336 10/20/14 0529  BP:  165/77  159/70  Pulse:  86  85  Temp:  98.4 F (36.9 C)  98.9 F (37.2 C)  TempSrc:  Oral  Oral  Resp: 18 24 22 22   Height:      Weight:    114.9 kg (253 lb 4.9 oz)  SpO2: 99% 96% 95% 96%    Weight change: 0.458 kg (1 lb 0.1 oz) Filed Weights   10/18/14 0640 10/19/14 0400 10/20/14 0529  Weight: 114.533 kg (252 lb 8 oz) 114.443 kg (252 lb 4.8 oz) 114.9 kg (253 lb 4.9 oz)   Body mass index is 32.51 kg/(m^2).   Gen Exam: Awake and alert with clear speech.   Neck: Supple, No JVD.   Chest: B/L Clear.   CVS: S1 S2 Regular, no murmurs.  Abdomen: soft, BS are very sluggish, midline incision with dressing-some distention still present Extremities: no edema, lower extremities warm to touch. Neurologic: Non Focal.   Skin: No Rash.   Wounds: N/A.    Intake/Output from previous day:  Intake/Output Summary (Last 24 hours) at 10/20/14 1137 Last data filed at 10/20/14 0829  Gross per 24 hour  Intake      0 ml  Output   4480 ml  Net  -4480 ml     LAB RESULTS: CBC  Recent Labs Lab 10/16/14 0444 10/17/14 0325 10/18/14 0533 10/19/14 0446 10/20/14 0555  WBC  13.4* 14.1* 13.6* 9.9 10.0  HGB 10.9* 9.9* 10.3* 9.6* 9.4*  HCT 34.1* 31.7* 32.2* 33.3* 29.7*  PLT 317 322 344 340 415*  MCV 93.2 94.9 93.1 95.7 93.7  MCH 29.8 29.6 29.8 27.6 29.7  MCHC 32.0 31.2 32.0 28.8* 31.6  RDW 13.0 12.9 13.0 13.2 13.1    Chemistries   Recent Labs Lab 10/15/14 0645 10/16/14 0444 10/17/14 0325 10/18/14 0533 10/20/14 0555  NA 136 137 136 139 134*  K 3.6 4.3 4.3 4.1 3.7  CL 105 104 103 107 101  CO2 23 28 30 23 26   GLUCOSE 140* 197* 155* 155* 131*  BUN 10 8 7  5* <5*  CREATININE 0.98 1.03 0.99 0.79 0.85  CALCIUM 8.1* 7.9* 7.9* 7.9* 8.4    CBG:  Recent  Labs Lab 10/19/14 1134 10/19/14 1640 10/20/14 0054 10/20/14 0624 10/20/14 1126  GLUCAP 151* 127* 144* 136* 119*    GFR Estimated Creatinine Clearance: 127.7 mL/min (by C-G formula based on Cr of 0.85).  Coagulation profile No results for input(s): INR, PROTIME in the last 168 hours.  Cardiac Enzymes  Recent Labs Lab 10/17/14 1535 10/17/14 1943 10/17/14 2322  TROPONINI 0.04* 0.04* 0.18*    Invalid input(s): POCBNP No results for input(s): DDIMER in the last 72 hours. No results for input(s): HGBA1C in the last 72 hours. No results for input(s): CHOL, HDL, LDLCALC, TRIG, CHOLHDL, LDLDIRECT in the last 72 hours.  Recent Labs  10/17/14 1535  TSH 1.067   No results for input(s): VITAMINB12, FOLATE, FERRITIN, TIBC, IRON, RETICCTPCT in the last 72 hours. No results for input(s): LIPASE, AMYLASE in the last 72 hours.  Urine Studies No results for input(s): UHGB, CRYS in the last 72 hours.  Invalid input(s): UACOL, UAPR, USPG, UPH, UTP, UGL, UKET, UBIL, UNIT, UROB, ULEU, UEPI, UWBC, URBC, UBAC, CAST, UCOM, BILUA  MICROBIOLOGY: Recent Results (from the past 240 hour(s))  Blood culture (routine x 2)     Status: None   Collection Time: 10/12/14  5:46 PM  Result Value Ref Range Status   Specimen Description BLOOD ARM RIGHT  Final   Special Requests BOTTLES DRAWN AEROBIC AND ANAEROBIC  5CC  Final   Culture   Final    NO GROWTH 5 DAYS Performed at [facility]    Report Status 10/19/2014 FINAL  Final  Blood culture (routine x 2)     Status: None   Collection Time: 10/12/14  5:52 PM  Result Value Ref Range Status   Specimen Description BLOOD ARM LEFT  Final   Special Requests BOTTLES DRAWN AEROBIC AND ANAEROBIC 5CC  Final   Culture   Final    NO GROWTH 5 DAYS Performed at [facility]    Report Status 10/19/2014 FINAL  Final  Culture, blood (routine x 2)     Status: None   Collection Time: 10/13/14  5:35 PM  Result Value Ref Range Status   Specimen Description BLOOD LEFT ARM  Final   Special Requests BOTTLES DRAWN AEROBIC AND ANAEROBIC 10 CC EACH  Final   Culture   Final    NO GROWTH 5 DAYS Note: Culture results may be compromised due to an excessive volume of blood received in culture bottles. Performed at [facility]    Report Status 10/20/2014 FINAL  Final  Culture, blood (routine x 2)     Status: None   Collection Time: 10/13/14  5:41 PM  Result Value Ref Range Status   Specimen Description BLOOD LEFT ARM  Final   Special Requests BOTTLES DRAWN AEROBIC AND ANAEROBIC 10 CC EACH  Final   Culture   Final    NO GROWTH 5 DAYS Note: Culture results may be compromised due to an excessive volume of blood received in culture bottles. Performed at [facility]    Report Status 10/20/2014 FINAL  Final  Surgical pcr screen     Status: Abnormal   Collection Time: 10/15/14 11:53 AM  Result Value Ref Range Status   MRSA, PCR POSITIVE (A) NEGATIVE Final   Staphylococcus aureus POSITIVE (A) NEGATIVE Final    Comment:        The Xpert SA Assay (FDA approved for NASAL specimens in patients over 58 years of age), is one component of a comprehensive surveillance program.  Test performance has been validated by [facility] for patients greater than or equal to 87 year old. It is not intended to diagnose infection nor to guide or  monitor treatment.     RADIOLOGY STUDIES/RESULTS: Ct Abdomen Pelvis W Contrast  10/12/2014   CLINICAL DATA:  59yom, lower abd pain, n/v/d, fever, and cysts on buttocks and in his groin. Past medical history includes DM.  EXAM: CT ABDOMEN AND PELVIS WITH CONTRAST  TECHNIQUE: Multidetector CT imaging of the abdomen and pelvis was performed using the standard protocol following bolus administration of intravenous contrast.  CONTRAST:  OMNIPAQUE IOHEXOL 300 MG/ML  SOLN  COMPARISON:  03/24/2006  FINDINGS: There is a heterogeneous collection contiguous with the posterior margin of the mid sigmoid colon measuring 4 cm x 3.3 cm x 3.6 cm. Is contiguous with an area of inflammatory changes small extraluminal bubbles of air that tracks along the course of the central left iliac vessels. The contiguous sigmoid colon shows mild wall thickening and multiple diverticula. Hazy inflammatory changes seen in the adjacent fat. Findings are most consistent with a very diverticular abscess.  There are additional scattered left colon diverticula without other evidence of diverticulitis. Remainder of the colon is unremarkable. Normal appendix is visualized. Normal small bowel.  There are prominent inguinal and external iliac chain lymph nodes. Largest measure 1 cm short axis. No other adenopathy. No ascites.  Lung bases essentially clear. Liver shows diffuse fatty infiltration. No liver mass or focal lesion.  Spleen, gallbladder, pancreas, adrenal glands, kidneys, ureters and bladder: Unremarkable.  Mild degenerative changes noted of the visualized spine. No osteoblastic or osteolytic lesions.  IMPRESSION: 1. Sigmoid colon diverticulitis with a very diverticular abscess measuring 4 cm in greatest dimension and a small amount of adjacent, contained, extraluminal air. 2. No other acute abnormalities. 3. Hepatic steatosis.   Electronically Signed   By: Amie Portland M.D.   On: 10/12/2014 17:15    Jeoffrey Massed, MD  [facility] Pager:[phone]  If 7PM-7AM, please contact night-coverage [email or web address] Password [email or web address] 10/20/2014, 11:37 AM   LOS: 8 days

## 2014-10-21 DIAGNOSIS — I248 Other forms of acute ischemic heart disease: Secondary | ICD-10-CM

## 2014-10-21 LAB — CBC
HCT: 31.2 % — ABNORMAL LOW (ref 39.0–52.0)
Hemoglobin: 9.9 g/dL — ABNORMAL LOW (ref 13.0–17.0)
MCH: 29.6 pg (ref 26.0–34.0)
MCHC: 31.7 g/dL (ref 30.0–36.0)
MCV: 93.4 fL (ref 78.0–100.0)
PLATELETS: 450 10*3/uL — AB (ref 150–400)
RBC: 3.34 MIL/uL — ABNORMAL LOW (ref 4.22–5.81)
RDW: 13.4 % (ref 11.5–15.5)
WBC: 8.7 10*3/uL (ref 4.0–10.5)

## 2014-10-21 LAB — GLUCOSE, CAPILLARY
Glucose-Capillary: 120 mg/dL — ABNORMAL HIGH (ref 70–99)
Glucose-Capillary: 127 mg/dL — ABNORMAL HIGH (ref 70–99)
Glucose-Capillary: 139 mg/dL — ABNORMAL HIGH (ref 70–99)
Glucose-Capillary: 150 mg/dL — ABNORMAL HIGH (ref 70–99)

## 2014-10-21 LAB — HEPARIN LEVEL (UNFRACTIONATED): Heparin Unfractionated: <0.1 IU/mL — ABNORMAL LOW (ref 0.30–0.70)

## 2014-10-21 MED ORDER — HYDROMORPHONE HCL 1 MG/ML IJ SOLN
0.5000 mg | INTRAMUSCULAR | Status: DC | PRN
  Administered 2014-10-21: 1 mg via INTRAVENOUS
  Administered 2014-10-22 – 2014-10-23 (×3): 2 mg via INTRAVENOUS
  Filled 2014-10-21: qty 1
  Filled 2014-10-21 (×4): qty 2

## 2014-10-21 MED ORDER — HYDROMORPHONE HCL 1 MG/ML IJ SOLN: 1.0000 mg | INTRAMUSCULAR | Status: DC | PRN

## 2014-10-21 NOTE — Progress Notes (Signed)
Patient ambulated in hallway about 350 feet with IV pump/pole. No complications and patient tolerated well.

## 2014-10-21 NOTE — Progress Notes (Signed)
PATIENT DETAILS Name: Leroy Chen Age: 59 y.o. Sex: male Date of Birth: 1955/12/18 Admit Date: 10/12/2014 Admitting Physician [name], MD ONG:[id number],LKGM Jomarie Longs, MD  Subjective: Feels much better. No output from colostomy yet  Assessment/Plan: Principal Problem:   Colonic diverticular abscess- s/p colectomy 10/15/14: Admitted and started on empiric antibiotics and other conservative treatment, unfortunately did not make any significant improvement with supportive measures, underwent Hartman's procedure on 10/15/14. Continues to be mostly NPO, has some bowel sounds although sluggish.Currently awaiting return of bowel function. General surgery following. Have discontinued IV Vancomycin, will continue Merrem (day 10 of 14) for now.   Active Problems:   Atrial fibrillation with RVR: Hospital stay complicated by development of atrial fibrillation with rapid ventricular response, started on IV Cardizem and IV heparin. Cardiology consulted, Cardizem has been discontinued, patient now on metoprolol IV.  Continue aspirin as CHADSVASc score is 1.Echocardiogram shows preserved LV function.    Diabetes mellitus, type 2: CBG's stable, continue with SSI for now, resume Amaryl when oral intake has resumed. Will plan on resuming metformin on discharge  Atrial fibrillation with RVR    Minimally elevated troponins: Likely secondary to demand ischemia in the setting of A. fib with RVR. Cardiology following. Echo shows no wall motion abnormality    Acute renal failure: Likely from prerenal azotemia. Resolved with supportive care.  Disposition: Remain inpatient-Home once bowel function    Antibiotics:  See below   Anti-infectives    Start     Dose/Rate Route Frequency Ordered Stop   10/16/14 2300  vancomycin (VANCOCIN) IVPB 1000 mg/200 mL premix  Status:  Discontinued     1,000 mg 200 mL/hr over 60 Minutes Intravenous 3 times per day 10/16/14 1646 10/20/14 1730   10/14/14 0200   vancomycin (VANCOCIN) IVPB 1000 mg/200 mL premix  Status:  Discontinued     1,000 mg 200 mL/hr over 60 Minutes Intravenous Every 12 hours 10/13/14 1323 10/16/14 1645   10/13/14 1330  vancomycin (VANCOCIN) IVPB 1000 mg/200 mL premix     1,000 mg 200 mL/hr over 60 Minutes Intravenous STAT 10/13/14 1323 10/13/14 1504   10/13/14 0745  meropenem (MERREM) 1 g in sodium chloride 0.9 % 100 mL IVPB     1 g 200 mL/hr over 30 Minutes Intravenous 3 times per day 10/13/14 0739     10/13/14 0600  ciprofloxacin (CIPRO) IVPB 400 mg  Status:  Discontinued     400 mg 200 mL/hr over 60 Minutes Intravenous Every 12 hours 10/12/14 2052 10/13/14 0739   10/12/14 2130  doxycycline (VIBRAMYCIN) 100 mg in dextrose 5 % 250 mL IVPB  Status:  Discontinued     100 mg 125 mL/hr over 120 Minutes Intravenous Every 12 hours 10/12/14 2037 10/13/14 0909   10/12/14 2045  metroNIDAZOLE (FLAGYL) IVPB 500 mg  Status:  Discontinued     500 mg 100 mL/hr over 60 Minutes Intravenous Every 8 hours 10/12/14 2033 10/13/14 0739   10/12/14 1800  vancomycin (VANCOCIN) 2,000 mg in sodium chloride 0.9 % 500 mL IVPB     2,000 mg 250 mL/hr over 120 Minutes Intravenous  Once 10/12/14 1736 10/12/14 2040   10/12/14 1745  ciprofloxacin (CIPRO) IVPB 400 mg     400 mg 200 mL/hr over 60 Minutes Intravenous  Once 10/12/14 1736 10/12/14 1946   10/12/14 1745  metroNIDAZOLE (FLAGYL) IVPB 500 mg     500 mg 100 mL/hr over 60 Minutes Intravenous  Once 10/12/14  1736 10/12/14 1904      DVT Prophylaxis: On IV heparin  Code Status: Full code   Family Communication None at bedside  Procedures:  None  CONSULTS:  cardiology and general surgery   MEDICATIONS: Scheduled Meds: . antiseptic oral rinse  7 mL Mouth Rinse BID  . aspirin  81 mg Oral Daily  . heparin subcutaneous  5,000 Units Subcutaneous 3 times per day  . insulin aspart  0-15 Units Subcutaneous 4 times per day  . meropenem (MERREM) IV  1 g Intravenous 3 times per day  .  metoprolol  2.5 mg Intravenous 4 times per day  . pantoprazole (PROTONIX) IV  40 mg Intravenous Q12H   Continuous Infusions: . 0.9 % NaCl with KCl 20 mEq / L 100 mL/hr (10/21/14 0038)  . lactated ringers 10 mL/hr at 10/15/14 1313   PRN Meds:.acetaminophen **OR** acetaminophen, alum & mag hydroxide-simeth, hydrALAZINE, HYDROmorphone (DILAUDID) injection, [DISCONTINUED] ondansetron **OR** ondansetron (ZOFRAN) IV, ondansetron **OR** ondansetron (ZOFRAN) IV, oxyCODONE-acetaminophen    PHYSICAL EXAM: Vital signs in last 24 hours: Filed Vitals:   10/21/14 0039 10/21/14 0445 10/21/14 0510 10/21/14 0648  BP:   172/75   Pulse:   83   Temp:   99.1 F (37.3 C)   TempSrc:   Oral   Resp: Height:      Weight:   113.626 kg (250 lb 8 oz)   SpO2: 96% 98% 95% 98%    Weight change: -1.274 kg (-2 lb 12.9 oz) Filed Weights   10/19/14 0400 10/20/14 0529 10/21/14 0510  Weight: 114.443 kg (252 lb 4.8 oz) 114.9 kg (253 lb 4.9 oz) 113.626 kg (250 lb 8 oz)   Body mass index is 32.15 kg/(m^2).   Gen Exam: Awake and alert with clear speech.   Neck: Supple, No JVD.   Chest: B/L Clear.  No rhonchi CVS: S1 S2 Regular, no murmurs.  Abdomen: soft, BS are very sluggish, midline incision with dressing-some distention still present Extremities: no edema, lower extremities warm to touch. Neurologic: Non Focal.   Skin: No Rash.   Wounds: N/A.    Intake/Output from previous day:  Intake/Output Summary (Last 24 hours) at 10/21/14 1138 Last data filed at 10/20/14 2214  Gross per 24 hour  Intake      0 ml  Output   1640 ml  Net  -1640 ml     LAB RESULTS: CBC  Recent Labs Lab 10/17/14 0325 10/18/14 0533 10/19/14 0446 10/20/14 0555 10/21/14 0547  WBC 14.1* 13.6* 9.9 10.0 8.7  HGB 9.9* 10.3* 9.6* 9.4* 9.9*  HCT 31.7* 32.2* 33.3* 29.7* 31.2*  PLT 322 344 340 415* 450*  MCV 94.9 93.1 95.7 93.7 93.4  MCH 29.6 29.8 27.6 29.7 29.6  MCHC 31.2 32.0 28.8* 31.6 31.7  RDW 12.9 13.0 13.2  13.1 13.4    Chemistries   Recent Labs Lab 10/15/14 0645 10/16/14 0444 10/17/14 0325 10/18/14 0533 10/20/14 0555  NA 136 137 136 139 134*  K 3.6 4.3 4.3 4.1 3.7  CL 105 104 103 107 101  CO2 GLUCOSE 140* 197* 155* 155* 131*  BUN 5* <5*  CREATININE 0.98 1.03 0.99 0.79 0.85  CALCIUM 8.1* 7.9* 7.9* 7.9* 8.4    CBG:  Recent Labs Lab 10/20/14 0624 10/20/14 1126 10/20/14 1649 10/20/14 2358 10/21/14 0623  GLUCAP 136* 119* 113* 134* 120*    GFR Estimated Creatinine Clearance: 127 mL/min (by C-G formula  based on Cr of 0.85).  Coagulation profile No results for input(s): INR, PROTIME in the last 168 hours.  Cardiac Enzymes  Recent Labs Lab 10/17/14 1535 10/17/14 1943 10/17/14 2322  TROPONINI 0.04* 0.04* 0.18*    Invalid input(s): POCBNP No results for input(s): DDIMER in the last 72 hours. No results for input(s): HGBA1C in the last 72 hours. No results for input(s): CHOL, HDL, LDLCALC, TRIG, CHOLHDL, LDLDIRECT in the last 72 hours. No results for input(s): TSH, T4TOTAL, T3FREE, THYROIDAB in the last 72 hours.  Invalid input(s): FREET3 No results for input(s): VITAMINB12, FOLATE, FERRITIN, TIBC, IRON, RETICCTPCT in the last 72 hours. No results for input(s): LIPASE, AMYLASE in the last 72 hours.  Urine Studies No results for input(s): UHGB, CRYS in the last 72 hours.  Invalid input(s): UACOL, UAPR, USPG, UPH, UTP, UGL, UKET, UBIL, UNIT, UROB, ULEU, UEPI, UWBC, URBC, UBAC, CAST, UCOM, BILUA  MICROBIOLOGY: Recent Results (from the past 240 hour(s))  Blood culture (routine x 2)     Status: None   Collection Time: 10/12/14  5:46 PM  Result Value Ref Range Status   Specimen Description BLOOD ARM RIGHT  Final   Special Requests BOTTLES DRAWN AEROBIC AND ANAEROBIC 5CC  Final   Culture   Final    NO GROWTH 5 DAYS Performed at [facility]    Report Status 10/19/2014 FINAL  Final  Blood culture (routine x 2)     Status: None    Collection Time: 10/12/14  5:52 PM  Result Value Ref Range Status   Specimen Description BLOOD ARM LEFT  Final   Special Requests BOTTLES DRAWN AEROBIC AND ANAEROBIC 5CC  Final   Culture   Final    NO GROWTH 5 DAYS Performed at [facility]    Report Status 10/19/2014 FINAL  Final  Culture, blood (routine x 2)     Status: None   Collection Time: 10/13/14  5:35 PM  Result Value Ref Range Status   Specimen Description BLOOD LEFT ARM  Final   Special Requests BOTTLES DRAWN AEROBIC AND ANAEROBIC 10 CC EACH  Final   Culture   Final    NO GROWTH 5 DAYS Note: Culture results may be compromised due to an excessive volume of blood received in culture bottles. Performed at [facility]    Report Status 10/20/2014 FINAL  Final  Culture, blood (routine x 2)     Status: None   Collection Time: 10/13/14  5:41 PM  Result Value Ref Range Status   Specimen Description BLOOD LEFT ARM  Final   Special Requests BOTTLES DRAWN AEROBIC AND ANAEROBIC 10 CC EACH  Final   Culture   Final    NO GROWTH 5 DAYS Note: Culture results may be compromised due to an excessive volume of blood received in culture bottles. Performed at [facility]    Report Status 10/20/2014 FINAL  Final  Surgical pcr screen     Status: Abnormal   Collection Time: 10/15/14 11:53 AM  Result Value Ref Range Status   MRSA, PCR POSITIVE (A) NEGATIVE Final   Staphylococcus aureus POSITIVE (A) NEGATIVE Final    Comment:        The Xpert SA Assay (FDA approved for NASAL specimens in patients over 27 years of age), is one component of a comprehensive surveillance program.  Test performance has been validated by [facility] for patients greater than or equal to 67 year old. It is not intended to diagnose infection  nor to guide or monitor treatment.     RADIOLOGY STUDIES/RESULTS: Ct Abdomen Pelvis W Contrast  10/12/2014   CLINICAL DATA:  59yom, lower abd pain, n/v/d, fever, and cysts on buttocks and in  his groin. Past medical history includes DM.  EXAM: CT ABDOMEN AND PELVIS WITH CONTRAST  TECHNIQUE: Multidetector CT imaging of the abdomen and pelvis was performed using the standard protocol following bolus administration of intravenous contrast.  CONTRAST:  100mL OMNIPAQUE IOHEXOL 300 MG/ML  SOLN  COMPARISON:  03/24/2006  FINDINGS: There is a heterogeneous collection contiguous with the posterior margin of the mid sigmoid colon measuring 4 cm x 3.3 cm x 3.6 cm. Is contiguous with an area of inflammatory changes small extraluminal bubbles of air that tracks along the course of the central left iliac vessels. The contiguous sigmoid colon shows mild wall thickening and multiple diverticula. Hazy inflammatory changes seen in the adjacent fat. Findings are most consistent with a very diverticular abscess.  There are additional scattered left colon diverticula without other evidence of diverticulitis. Remainder of the colon is unremarkable. Normal appendix is visualized. Normal small bowel.  There are prominent inguinal and external iliac chain lymph nodes. Largest measure 1 cm short axis. No other adenopathy. No ascites.  Lung bases essentially clear. Liver shows diffuse fatty infiltration. No liver mass or focal lesion.  Spleen, gallbladder, pancreas, adrenal glands, kidneys, ureters and bladder: Unremarkable.  Mild degenerative changes noted of the visualized spine. No osteoblastic or osteolytic lesions.  IMPRESSION: 1. Sigmoid colon diverticulitis with a very diverticular abscess measuring 4 cm in greatest dimension and a small amount of adjacent, contained, extraluminal air. 2. No other acute abnormalities. 3. Hepatic steatosis.   Electronically Signed   By: Amie Portlandavid  Ormond M.D.   On: 10/12/2014 17:15    Jeoffrey MassedGHIMIRE,SHANKER, MD  [facility] Pager:[phone]  If 7PM-7AM, please contact night-coverage [email or web address] Password [email or web address] 10/21/2014, 11:38 AM   LOS: 9 days

## 2014-10-21 NOTE — Progress Notes (Signed)
Patient ambulated 350 ft unassisted with IV pole on room air.  Patient tolerated walking well.  RN will continue to monitor

## 2014-10-21 NOTE — Progress Notes (Signed)
Patient ID: Leroy Chen, male   DOB: 10/04/1955, 59 y.o.   MRN: [id number] 6 Days Post-Op  Subjective: Pt has mobilized already a lot this morning.  Less distended.  No nausea  Objective: Vital signs in last 24 hours: Temp:  [98.9 F (37.2 C)-99.2 F (37.3 C)] 99.1 F (37.3 C) (04/06 0510) Pulse Rate:  [80-83] 83 (04/06 0510) Resp:  [16-22] 20 (04/06 0648) BP: (168-177)/(61-75) 172/75 mmHg (04/06 0510) SpO2:  [94 %-98 %] 98 % (04/06 0648) FiO2 (%):  [96 %-98 %] 98 % (04/06 0039) Weight:  [113.626 kg (250 lb 8 oz)] 113.626 kg (250 lb 8 oz) (04/06 0510) Last BM Date:  (prior to surgery)  Intake/Output from previous day: 04/05 0701 - 04/06 0700 In: 0  Out: 2070 [Urine:1900; Drains:170] Intake/Output this shift:    PE: Abd: softer, less distended today,  Good amount of flatus in pouch today, no stool yet.  Midline wound is clean and packed.  JP with serosang output  Lab Results:   Recent Labs  10/20/14 0555 10/21/14 0547  WBC 10.0 8.7  HGB 9.4* 9.9*  HCT 29.7* 31.2*  PLT 415* 450*   BMET  Recent Labs  10/20/14 0555  NA 134*  K 3.7  CL 101  CO2 26  GLUCOSE 131*  BUN <5*  CREATININE 0.85  CALCIUM 8.4   PT/INR No results for input(s): LABPROT, INR in the last 72 hours. CMP     Component Value Date/Time   NA 134* 10/20/2014 0555   K 3.7 10/20/2014 0555   CL 101 10/20/2014 0555   CO2 26 10/20/2014 0555   GLUCOSE 131* 10/20/2014 0555   BUN <5* 10/20/2014 0555   CREATININE 0.85 10/20/2014 0555   CALCIUM 8.4 10/20/2014 0555   PROT 7.3 10/13/2014 0515   ALBUMIN 2.9* 10/13/2014 0515   AST 15 10/13/2014 0515   ALT 15 10/13/2014 0515   ALKPHOS 55 10/13/2014 0515   BILITOT 1.8* 10/13/2014 0515   GFRNONAA >90 10/20/2014 0555   GFRAA >90 10/20/2014 0555   Lipase  No results found for: LIPASE     Studies/Results: No results found.  Anti-infectives: Anti-infectives    Start     Dose/Rate Route Frequency Ordered Stop   10/16/14 2300  vancomycin  (VANCOCIN) IVPB 1000 mg/200 mL premix  Status:  Discontinued     1,000 mg 200 mL/hr over 60 Minutes Intravenous 3 times per day 10/16/14 1646 10/20/14 1730   10/14/14 0200  vancomycin (VANCOCIN) IVPB 1000 mg/200 mL premix  Status:  Discontinued     1,000 mg 200 mL/hr over 60 Minutes Intravenous Every 12 hours 10/13/14 1323 10/16/14 1645   10/13/14 1330  vancomycin (VANCOCIN) IVPB 1000 mg/200 mL premix     1,000 mg 200 mL/hr over 60 Minutes Intravenous STAT 10/13/14 1323 10/13/14 1504   10/13/14 0745  meropenem (MERREM) 1 g in sodium chloride 0.9 % 100 mL IVPB     1 g 200 mL/hr over 30 Minutes Intravenous 3 times per day 10/13/14 0739     10/13/14 0600  ciprofloxacin (CIPRO) IVPB 400 mg  Status:  Discontinued     400 mg 200 mL/hr over 60 Minutes Intravenous Every 12 hours 10/12/14 2052 10/13/14 0739   10/12/14 2130  doxycycline (VIBRAMYCIN) 100 mg in dextrose 5 % 250 mL IVPB  Status:  Discontinued     100 mg 125 mL/hr over 120 Minutes Intravenous Every 12 hours 10/12/14 2037 10/13/14 0909   10/12/14 2045  metroNIDAZOLE (FLAGYL)  IVPB 500 mg  Status:  Discontinued     500 mg 100 mL/hr over 60 Minutes Intravenous Every 8 hours 10/12/14 2033 10/13/14 0739   10/12/14 1800  vancomycin (VANCOCIN) 2,000 mg in sodium chloride 0.9 % 500 mL IVPB     2,000 mg 250 mL/hr over 120 Minutes Intravenous  Once 10/12/14 1736 10/12/14 2040   10/12/14 1745  ciprofloxacin (CIPRO) IVPB 400 mg     400 mg 200 mL/hr over 60 Minutes Intravenous  Once 10/12/14 1736 10/12/14 1946   10/12/14 1745  metroNIDAZOLE (FLAGYL) IVPB 500 mg     500 mg 100 mL/hr over 60 Minutes Intravenous  Once 10/12/14 1736 10/12/14 1904       Assessment/Plan  POD 6 s/p Hartmann procedure for diverticulitis with abscess - 10/15/2014 - Derrell Lolling -has bowel function.  Will give clear liquid tray today -per Dr. Derrell Lolling.  He wants patient to receive 14 days total of abx therapy, d/w Dr. Jerral Ralph yesterday -  WD dressing changes BID - WOC consult             -agree with DC PCA to wean down things the patient is hooked up to.  Will do IV pushes and if we can advance to fulls tomorrow, will convert to oral pain meds then. DVT prophylaxis -heparin/ SCDs New onset A fib  -per primary service -now in NSR   LOS: 9 days    OSBORNE,KELLY E 10/21/2014, 9:22 AM Pager: [phone]  Agree with above. He looks like he is slowly getting better.  Ovidio Kin, MD, [facility] Surgery Pager: [phone] Office phone:  [phone]'

## 2014-10-21 NOTE — Progress Notes (Signed)
Patient ambulated in hallway, unassisted with IV Pole and on room air for about 350 ft. Tolerated well. Patient would also like for PCA to be discontinued. RN will discuss with MD today.

## 2014-10-21 NOTE — Consult Note (Addendum)
WOC ostomy follow up Stoma type/location: Colostomy surgery to LLQ  Stomal assessment/size: Stoma visualized through pouch, intact with good seal. Output  Pt has large amt flatus, no stool. Ostomy pouching: 1pc. With barrier ring  Education provided:  Demonstrated how to empty flatus and re-seal.  Pt prefers to have pouch change performed tomorrow, pouch was applied on Monday.  Discussed various support belts and undergarments and supply catalog marked if patient is interested in these after he returns home since he asked PT these questions yesterday. Pouches and barrier rings at bedside.  Reviewed pouching routines and ordering supplies. Pt asks appropriate questions. Cammie Mcgeeawn Jasiyah Paulding MSN, RN, CWOCN, [place], CNS [phone]

## 2014-10-21 NOTE — Progress Notes (Signed)
Subjective:  Up in chair, ambulated in hallway this am.  Objective:  Vital Signs in the last 24 hours: Temp:  [98.9 F (37.2 C)-99.2 F (37.3 C)] 99.1 F (37.3 C) (04/06 0510) Pulse Rate:  [80-83] 83 (04/06 0510) Resp:  [16-22] 20 (04/06 0648) BP: (168-177)/(61-75) 172/75 mmHg (04/06 0510) SpO2:  [94 %-98 %] 98 % (04/06 0648) FiO2 (%):  [96 %-98 %] 98 % (04/06 0039) Weight:  [250 lb 8 oz (113.626 kg)] 250 lb 8 oz (113.626 kg) (04/06 0510)  Intake/Output from previous day:  Intake/Output Summary (Last 24 hours) at 10/21/14 0755 Last data filed at 10/20/14 2214  Gross per 24 hour  Intake      0 ml  Output   2070 ml  Net  -2070 ml    Physical Exam: General appearance: alert, cooperative, no distress and mildly obese Lungs: clear to auscultation bilaterally Heart: regular rate and rhythm Abdomen: distended, BS quiet Extremities: no edema   Rate: 82  Rhythm: normal sinus rhythm  Lab Results:  Recent Labs  10/20/14 0555 10/21/14 0547  WBC 10.0 8.7  HGB 9.4* 9.9*  PLT 415* 450*    Recent Labs  10/20/14 0555  NA 134*  K 3.7  CL 101  CO2 26  GLUCOSE 131*  BUN <5*  CREATININE 0.85   No results for input(s): TROPONINI in the last 72 hours.  Invalid input(s): CK, MB No results for input(s): INR in the last 72 hours.  Imaging: Imaging results have been reviewed  Cardiac Studies: Echo 10/19/14 Study Conclusions  - Left ventricle: The cavity size was normal. Wall thickness was increased in a pattern of mild LVH. Systolic function was normal. The estimated ejection fraction was in the range of 55% to 60%. Left ventricular diastolic function parameters were normal. - Left atrium: The atrium was mildly dilated  Assessment/Plan:  59 y.o. male with a past medical history significant for DM and smoking. He was admitted 10/12/14 with a diverticular abscess. He underwent exp lap and sigmoid colectomy with colostomy 10/15/14. Post op he was noted to be in  AF with RVR and we are asked to consult. Review of the pt's records and EKGs show he was in AF in March 3015. He is on no rate control medications prior to admission- just ASA. His CHADSVASc score is 1 for DM. He apprently is unaware of his AF. Now he is in NSR.    Principal Problem:   Colonic diverticular abscess- s/p colectomy 10/15/14 Active Problems:   Atrial fibrillation with RVR   Diabetes mellitus, type 2   Smoker   Demand ischemia from AF with RVR-Troponin 0.07   PLAN: Recommendations per Dr Erin Hearingroitoru's note from 10/20/14- ASA low dose beta blocker at discharge, consider ACE if needed for HTN but would defer to his PCP.   Corine ShelterLuke Kilroy PA-C Beeper [phone] 10/21/2014, 7:55 AM   I have seen and examined the patient along with Corine ShelterLuke Kilroy PA-C.  I have reviewed the chart, notes and new data.  I agree with PA's note.  His BP has oscillated some while in the hospital, sometimes frankly normal, more often mildly high. He is confident that he does not have HTN. He is also firmly against potent anticoagulants at this time. There is a plan for colonic reanastomosis in the next 6 months or so.  PLAN: Sodium restricted diet discussed in detail. Needs to try to lose weight and exercise once he recovers from surgery. Asked him to monitor  BP at home and work. Discussed association between AFib and stroke. Reevaluate anticoagulation indication once away from acute care setting. Aspirin for now.  Thurmon Fair, MD, [facility] [phone] 10/21/2014, 11:50 AM

## 2014-10-22 LAB — CBC
HCT: 31.2 % — ABNORMAL LOW (ref 39.0–52.0)
Hemoglobin: 9.9 g/dL — ABNORMAL LOW (ref 13.0–17.0)
MCH: 29.8 pg (ref 26.0–34.0)
MCHC: 31.7 g/dL (ref 30.0–36.0)
MCV: 94 fL (ref 78.0–100.0)
Platelets: 406 10*3/uL — ABNORMAL HIGH (ref 150–400)
RBC: 3.32 MIL/uL — ABNORMAL LOW (ref 4.22–5.81)
RDW: 13.5 % (ref 11.5–15.5)
WBC: 7.4 10*3/uL (ref 4.0–10.5)

## 2014-10-22 LAB — GLUCOSE, CAPILLARY
GLUCOSE-CAPILLARY: 140 mg/dL — AB (ref 70–99)
Glucose-Capillary: 112 mg/dL — ABNORMAL HIGH (ref 70–99)
Glucose-Capillary: 121 mg/dL — ABNORMAL HIGH (ref 70–99)

## 2014-10-22 MED ORDER — CIPROFLOXACIN HCL 500 MG PO TABS
500.0000 mg | ORAL_TABLET | Freq: Two times a day (BID) | ORAL | Status: DC
  Administered 2014-10-22 – 2014-10-23 (×4): 500 mg via ORAL
  Filled 2014-10-22 (×7): qty 1

## 2014-10-22 MED ORDER — METRONIDAZOLE 500 MG PO TABS
500.0000 mg | ORAL_TABLET | Freq: Three times a day (TID) | ORAL | Status: DC
  Administered 2014-10-22 – 2014-10-24 (×7): 500 mg via ORAL
  Filled 2014-10-22 (×10): qty 1

## 2014-10-22 MED ORDER — PANTOPRAZOLE SODIUM 40 MG PO TBEC
40.0000 mg | DELAYED_RELEASE_TABLET | Freq: Two times a day (BID) | ORAL | Status: DC
  Administered 2014-10-22 – 2014-10-24 (×3): 40 mg via ORAL
  Filled 2014-10-22 (×3): qty 1

## 2014-10-22 MED ORDER — METOPROLOL TARTRATE 25 MG PO TABS
25.0000 mg | ORAL_TABLET | Freq: Two times a day (BID) | ORAL | Status: DC
Start: 2014-10-22 — End: 2014-10-23
  Administered 2014-10-22 – 2014-10-23 (×3): 25 mg via ORAL
  Filled 2014-10-22 (×4): qty 1

## 2014-10-22 NOTE — Consult Note (Addendum)
WOC ostomy follow up Pt states he prefers to have ostomy pouch changed tomorrow when pouch change education was offered.  Current pouch is intact with good seal and small amt brown liquid.  Supplies at bedside for staff use.  Recommend home health assistance after discharge. Will follow-up tomorrow as requested. Cammie Mcgeeawn Kateena Degroote MSN, RN, CWOCN, [place], CNS [phone]

## 2014-10-22 NOTE — Care Management Note (Signed)
    Page 1 of 2   10/23/2014     11:08:45 AM CARE MANAGEMENT NOTE 10/23/2014  Patient:  Leroy Chen,Leroy Chen   Account Number:  [id number]  Date Initiated:  10/13/2014  Documentation initiated by:  [facility]  Subjective/Objective Assessment:   diverticular abscess     Action/Plan:   Anticipated DC Date:  10/26/2014   Anticipated DC Plan:  HOME W HOME HEALTH SERVICES      DC Planning Services  CM consult      [facility]   Choice offered to / List presented to:  C-1 Patient        HH arranged  HH-1 RN      [facility]  [facility]   Status of service:  Completed, signed off Medicare Important Message given?  NO (If response is "NO", the following Medicare IM given date fields will be blank) Date Medicare IM given:   Medicare IM given by:   Date Additional Medicare IM given:   Additional Medicare IM given by:    Discharge Disposition:  HOME W HOME HEALTH SERVICES  Per UR Regulation:  Reviewed for med. necessity/level of care/duration of stay  If discussed at Long Length of Stay Meetings, dates discussed:   10/22/2014    Comments:  10/23/14- 1030- Leroy Puthoff RN, BSN [phone] [facility]- per pt choice [facility]- [facility]- pt for potential Chen/c later today vs. tomorrow  10/22/14- 1400- Leroy PieriniKristi Brita Jurgensen RN, BSN [phone] Referral for HH-RN- in to speak with pt- [facility] [place]- pt would like to think about choice- will f/u in am regarding which agency pt would like to use for services.  10/21/14- 1100- Leroy PieriniKristi Tynetta Bachmann RN, BSN [phone] Pt  POD 6  s/p Gertie GowdaHartmann procedure for diverticulitis with abscess, new colostomy - 10/15/2014 - [facility]--making slow progress- plan to start clears today.    10/13/14 Spoke with patient about need for medication assistance, he stated that he is unable to afford Testim 1% which is $1000 per month. Checked online,  no assistance programs or coupons available. Recommended to patient to speak to ordering MD about possiblity of ordering a different brand and gave him a list of other brands listed on [email or web address] which have coupons or assistance programs available and gave him information about [email or web address].CM will continue to follow patient for Chen/c needs.

## 2014-10-22 NOTE — Progress Notes (Addendum)
Patient ID: Leroy Chen, male   DOB: [birth year], 59 y.o.   MRN: [id number] 7 Days Post-Op  Subjective: Pt feels well today.  No issues with clear liquids.  Already taking oral pain meds.  walking  Objective: Vital signs in last 24 hours: Temp:  [98.2 F (36.8 C)-99.1 F (37.3 C)] 98.6 F (37 C) (04/07 0436) Pulse Rate:  [75-81] 77 (04/07 0626) Resp:  [18] 18 (04/07 0436) BP: (145-181)/(64-79) 166/70 mmHg (04/07 0626) SpO2:  [98 %-99 %] 99 % (04/07 0436) Weight:  [111 kg (244 lb 11.4 oz)] 111 kg (244 lb 11.4 oz) (04/07 0436) Last BM Date:  (colostomy sweating)  Intake/Output from previous day: 04/06 0701 - 04/07 0700 In: 80  Out: 1710 [Urine:1600; Drains:110] Intake/Output this shift:    PE: Abd: soft, midline wound stable and clean, JP drain with no output this morning.  Ostomy full of flatus.  No definite feculent output yet.  Lab Results:   Recent Labs  10/20/14 0555 10/21/14 0547  WBC 10.0 8.7  HGB 9.4* 9.9*  HCT 29.7* 31.2*  PLT 415* 450*   BMET  Recent Labs  10/20/14 0555  NA 134*  K 3.7  CL 101  CO2 26  GLUCOSE 131*  BUN <5*  CREATININE 0.85  CALCIUM 8.4   PT/INR No results for input(s): LABPROT, INR in the last 72 hours. CMP     Component Value Date/Time   NA 134* 10/20/2014 0555   K 3.7 10/20/2014 0555   CL 101 10/20/2014 0555   CO2 26 10/20/2014 0555   GLUCOSE 131* 10/20/2014 0555   BUN <5* 10/20/2014 0555   CREATININE 0.85 10/20/2014 0555   CALCIUM 8.4 10/20/2014 0555   PROT 7.3 10/13/2014 0515   ALBUMIN 2.9* 10/13/2014 0515   AST 15 10/13/2014 0515   ALT 15 10/13/2014 0515   ALKPHOS 55 10/13/2014 0515   BILITOT 1.8* 10/13/2014 0515   GFRNONAA >90 10/20/2014 0555   GFRAA >90 10/20/2014 0555   Lipase  No results found for: LIPASE     Studies/Results: No results found.  Anti-infectives: Anti-infectives    Start     Dose/Rate Route Frequency Ordered Stop   10/16/14 2300  vancomycin (VANCOCIN) IVPB 1000 mg/200 mL premix   Status:  Discontinued     1,000 mg 200 mL/hr over 60 Minutes Intravenous 3 times per day 10/16/14 1646 10/20/14 1730   10/14/14 0200  vancomycin (VANCOCIN) IVPB 1000 mg/200 mL premix  Status:  Discontinued     1,000 mg 200 mL/hr over 60 Minutes Intravenous Every 12 hours 10/13/14 1323 10/16/14 1645   10/13/14 1330  vancomycin (VANCOCIN) IVPB 1000 mg/200 mL premix     1,000 mg 200 mL/hr over 60 Minutes Intravenous STAT 10/13/14 1323 10/13/14 1504   10/13/14 0745  meropenem (MERREM) 1 g in sodium chloride 0.9 % 100 mL IVPB     1 g 200 mL/hr over 30 Minutes Intravenous 3 times per day 10/13/14 0739     10/13/14 0600  ciprofloxacin (CIPRO) IVPB 400 mg  Status:  Discontinued     400 mg 200 mL/hr over 60 Minutes Intravenous Every 12 hours 10/12/14 2052 10/13/14 0739   10/12/14 2130  doxycycline (VIBRAMYCIN) 100 mg in dextrose 5 % 250 mL IVPB  Status:  Discontinued     100 mg 125 mL/hr over 120 Minutes Intravenous Every 12 hours 10/12/14 2037 10/13/14 0909   10/12/14 2045  metroNIDAZOLE (FLAGYL) IVPB 500 mg  Status:  Discontinued  500 mg 100 mL/hr over 60 Minutes Intravenous Every 8 hours 10/12/14 2033 10/13/14 0739   10/12/14 1800  vancomycin (VANCOCIN) 2,000 mg in sodium chloride 0.9 % 500 mL IVPB     2,000 mg 250 mL/hr over 120 Minutes Intravenous  Once 10/12/14 1736 10/12/14 2040   10/12/14 1745  ciprofloxacin (CIPRO) IVPB 400 mg     400 mg 200 mL/hr over 60 Minutes Intravenous  Once 10/12/14 1736 10/12/14 1946   10/12/14 1745  metroNIDAZOLE (FLAGYL) IVPB 500 mg     500 mg 100 mL/hr over 60 Minutes Intravenous  Once 10/12/14 1736 10/12/14 1904       Assessment/Plan   POD 7 s/p Hartmann procedure for diverticulitis with abscess - 10/15/2014 - Derrell LollingIngram -has bowel function. Will adv to full liquids -per Dr. Derrell LollingIngram. He wants patient to receive 14 days total of abx therapy.  May change to oral cipro and flagyl from IV merrem - WD dressing changes  BID - WOC consult  -on oral pain meds             -will arrange HH today DVT prophylaxis -heparin/ SCDs New onset A fib  -per primary service -now in NSR  On Isolation for MRSA   LOS: 10 days   OSBORNE,KELLY E 10/22/2014, 7:41 AM Pager: [phone]  Agree with above. Seem to be steadily improving.  Has small amount of stool in ostomy bag.  Would like to see ostomy functioning more. Drain - 110 cc last 24 hours - possibly get drain out tomorrow.  Ovidio Kinavid Nyeemah Jennette, MD, [facility] Pager: [phone] Office phone:  [phone]

## 2014-10-22 NOTE — Progress Notes (Signed)
PATIENT DETAILS Name: Leroy Chen Age: 59 y.o. Sex: male Date of Birth: 1955-10-31 Admit Date: 10/12/2014 Admitting Physician [name], MD [id number] Jomarie Longs, MD  Subjective: Pain better. No output from colostomy yet  Assessment/Plan: Principal Problem:   Colonic diverticular abscess- s/p colectomy 10/15/14: Admitted and started on empiric antibiotics and other conservative treatment, unfortunately did not make any significant improvement with supportive measures, underwent Hartman's procedure on 10/15/14. Continues to be mostly NPO, has some bowel sounds although sluggish.Currently awaiting return of bowel function. General surgery following. Have discontinued IV Vancomycin Merrem, continue Cipro/Flagyl (day 11 of 14) for now.   Active Problems:   Atrial fibrillation with RVR: Hospital stay complicated by development of atrial fibrillation with rapid ventricular response, started on IV Cardizem and IV heparin. Cardiology consulted, Cardizem has been discontinued, patient now on metoprolol.  Continue aspirin as CHADSVASc score is 1.Echocardiogram shows preserved LV function.    Diabetes mellitus, type 2: CBG's stable, continue with SSI for now, resume Amaryl when oral intake has resumed. Will plan on resuming metformin on discharge      Minimally elevated troponins: Likely secondary to demand ischemia in the setting of A. fib with RVR. Cardiology following. Echo shows no wall motion abnormality    Acute renal failure: Likely from prerenal azotemia. Resolved with supportive care.  Disposition: Remain inpatient-Home once bowel function   Antibiotics:  See below   Anti-infectives    Start     Dose/Rate Route Frequency Ordered Stop   10/22/14 0800  ciprofloxacin (CIPRO) tablet 500 mg     500 mg Oral 2 times daily 10/22/14 0744     10/22/14 0745  metroNIDAZOLE (FLAGYL) tablet 500 mg     500 mg Oral 3 times per day 10/22/14 0744     10/16/14 2300  vancomycin  (VANCOCIN) IVPB 1000 mg/200 mL premix  Status:  Discontinued     1,000 mg 200 mL/hr over 60 Minutes Intravenous 3 times per day 10/16/14 1646 10/20/14 1730   10/14/14 0200  vancomycin (VANCOCIN) IVPB 1000 mg/200 mL premix  Status:  Discontinued     1,000 mg 200 mL/hr over 60 Minutes Intravenous Every 12 hours 10/13/14 1323 10/16/14 1645   10/13/14 1330  vancomycin (VANCOCIN) IVPB 1000 mg/200 mL premix     1,000 mg 200 mL/hr over 60 Minutes Intravenous STAT 10/13/14 1323 10/13/14 1504   10/13/14 0745  meropenem (MERREM) 1 g in sodium chloride 0.9 % 100 mL IVPB  Status:  Discontinued     1 g 200 mL/hr over 30 Minutes Intravenous 3 times per day 10/13/14 0739 10/22/14 0744   10/13/14 0600  ciprofloxacin (CIPRO) IVPB 400 mg  Status:  Discontinued     400 mg 200 mL/hr over 60 Minutes Intravenous Every 12 hours 10/12/14 2052 10/13/14 0739   10/12/14 2130  doxycycline (VIBRAMYCIN) 100 mg in dextrose 5 % 250 mL IVPB  Status:  Discontinued     100 mg 125 mL/hr over 120 Minutes Intravenous Every 12 hours 10/12/14 2037 10/13/14 0909   10/12/14 2045  metroNIDAZOLE (FLAGYL) IVPB 500 mg  Status:  Discontinued     500 mg 100 mL/hr over 60 Minutes Intravenous Every 8 hours 10/12/14 2033 10/13/14 0739   10/12/14 1800  vancomycin (VANCOCIN) 2,000 mg in sodium chloride 0.9 % 500 mL IVPB     2,000 mg 250 mL/hr over 120 Minutes Intravenous  Once 10/12/14 1736 10/12/14 2040   10/12/14 1745  ciprofloxacin (  CIPRO) IVPB 400 mg     400 mg 200 mL/hr over 60 Minutes Intravenous  Once 10/12/14 1736 10/12/14 1946   10/12/14 1745  metroNIDAZOLE (FLAGYL) IVPB 500 mg     500 mg 100 mL/hr over 60 Minutes Intravenous  Once 10/12/14 1736 10/12/14 1904      DVT Prophylaxis: On IV heparin  Code Status: Full code   Family Communication None at bedside  Procedures:  None  CONSULTS:  cardiology and general surgery   MEDICATIONS: Scheduled Meds: . antiseptic oral rinse  7 mL Mouth Rinse BID  . aspirin  81  mg Oral Daily  . ciprofloxacin  500 mg Oral BID  . heparin subcutaneous  5,000 Units Subcutaneous 3 times per day  . insulin aspart  0-15 Units Subcutaneous 4 times per day  . metoprolol  2.5 mg Intravenous 4 times per day  . metroNIDAZOLE  500 mg Oral 3 times per day  . pantoprazole (PROTONIX) IV  40 mg Intravenous Q12H   Continuous Infusions: . 0.9 % NaCl with KCl 20 mEq / L 100 mL/hr at 10/22/14 0637  . lactated ringers 10 mL/hr at 10/15/14 1313   PRN Meds:.acetaminophen **OR** acetaminophen, alum & mag hydroxide-simeth, hydrALAZINE, HYDROmorphone (DILAUDID) injection, [DISCONTINUED] ondansetron **OR** ondansetron (ZOFRAN) IV, ondansetron **OR** ondansetron (ZOFRAN) IV, oxyCODONE-acetaminophen    PHYSICAL EXAM: Vital signs in last 24 hours: Filed Vitals:   10/21/14 2138 10/21/14 2352 10/22/14 0436 10/22/14 0626  BP: 181/79 145/64 171/70 166/70  Pulse: 81 75 77 77  Temp: 99.1 F (37.3 C)  98.6 F (37 C)   TempSrc: Oral  Oral   Resp: 18  18   Height:      Weight:   111 kg (244 lb 11.4 oz)   SpO2: 99%  99%     Weight change: -2.626 kg (-5 lb 12.6 oz) Filed Weights   10/20/14 0529 10/21/14 0510 10/22/14 0436  Weight: 114.9 kg (253 lb 4.9 oz) 113.626 kg (250 lb 8 oz) 111 kg (244 lb 11.4 oz)   Body mass index is 31.41 kg/(m^2).   Gen Exam: Awake and alert with clear speech.   Neck: Supple, No JVD.   Chest: B/L Clear.  No rhonchi CVS: S1 S2 Regular, no murmurs.  Abdomen: soft, BS are very sluggish, midline incision with dressing-some distention still present. No output in ostomy yet. Extremities: no edema, lower extremities warm to touch. Neurologic: Non Focal.   Skin: No Rash.   Wounds: N/A.    Intake/Output from previous day:  Intake/Output Summary (Last 24 hours) at 10/22/14 1142 Last data filed at 10/22/14 1610  Gross per 24 hour  Intake     80 ml  Output   1710 ml  Net  -1630 ml     LAB RESULTS: CBC  Recent Labs Lab 10/18/14 0533 10/19/14 0446  10/20/14 0555 10/21/14 0547 10/22/14 0524  WBC 13.6* 9.9 10.0 8.7 7.4  HGB 10.3* 9.6* 9.4* 9.9* 9.9*  HCT 32.2* 33.3* 29.7* 31.2* 31.2*  PLT 344 340 415* 450* 406*  MCV 93.1 95.7 93.7 93.4 94.0  MCH 29.8 27.6 29.7 29.6 29.8  MCHC 32.0 28.8* 31.6 31.7 31.7  RDW 13.0 13.2 13.1 13.4 13.5    Chemistries   Recent Labs Lab 10/16/14 0444 10/17/14 0325 10/18/14 0533 10/20/14 0555  NA 137 136 139 134*  K 4.3 4.3 4.1 3.7  CL 104 103 107 101  CO2 28 30 23 26   GLUCOSE 197* 155* 155* 131*  BUN 8 7  5* <5*  CREATININE 1.03 0.99 0.79 0.85  CALCIUM 7.9* 7.9* 7.9* 8.4    CBG:  Recent Labs Lab 10/21/14 0623 10/21/14 1143 10/21/14 1733 10/21/14 2352 10/22/14 0644  GLUCAP 120* 150* 139* 127* 112*    GFR Estimated Creatinine Clearance: 125.5 mL/min (by C-G formula based on Cr of 0.85).  Coagulation profile No results for input(s): INR, PROTIME in the last 168 hours.  Cardiac Enzymes  Recent Labs Lab 10/17/14 1535 10/17/14 1943 10/17/14 2322  TROPONINI 0.04* 0.04* 0.18*    Invalid input(s): POCBNP No results for input(s): DDIMER in the last 72 hours. No results for input(s): HGBA1C in the last 72 hours. No results for input(s): CHOL, HDL, LDLCALC, TRIG, CHOLHDL, LDLDIRECT in the last 72 hours. No results for input(s): TSH, T4TOTAL, T3FREE, THYROIDAB in the last 72 hours.  Invalid input(s): FREET3 No results for input(s): VITAMINB12, FOLATE, FERRITIN, TIBC, IRON, RETICCTPCT in the last 72 hours. No results for input(s): LIPASE, AMYLASE in the last 72 hours.  Urine Studies No results for input(s): UHGB, CRYS in the last 72 hours.  Invalid input(s): UACOL, UAPR, USPG, UPH, UTP, UGL, UKET, UBIL, UNIT, UROB, ULEU, UEPI, UWBC, URBC, UBAC, CAST, UCOM, BILUA  MICROBIOLOGY: Recent Results (from the past 240 hour(s))  Blood culture (routine x 2)     Status: None   Collection Time: 10/12/14  5:46 PM  Result Value Ref Range Status   Specimen Description BLOOD ARM RIGHT   Final   Special Requests BOTTLES DRAWN AEROBIC AND ANAEROBIC 5CC  Final   Culture   Final    NO GROWTH 5 DAYS Performed at [facility]    Report Status 10/19/2014 FINAL  Final  Blood culture (routine x 2)     Status: None   Collection Time: 10/12/14  5:52 PM  Result Value Ref Range Status   Specimen Description BLOOD ARM LEFT  Final   Special Requests BOTTLES DRAWN AEROBIC AND ANAEROBIC 5CC  Final   Culture   Final    NO GROWTH 5 DAYS Performed at [facility]    Report Status 10/19/2014 FINAL  Final  Culture, blood (routine x 2)     Status: None   Collection Time: 10/13/14  5:35 PM  Result Value Ref Range Status   Specimen Description BLOOD LEFT ARM  Final   Special Requests BOTTLES DRAWN AEROBIC AND ANAEROBIC 10 CC EACH  Final   Culture   Final    NO GROWTH 5 DAYS Note: Culture results may be compromised due to an excessive volume of blood received in culture bottles. Performed at [facility]    Report Status 10/20/2014 FINAL  Final  Culture, blood (routine x 2)     Status: None   Collection Time: 10/13/14  5:41 PM  Result Value Ref Range Status   Specimen Description BLOOD LEFT ARM  Final   Special Requests BOTTLES DRAWN AEROBIC AND ANAEROBIC 10 CC EACH  Final   Culture   Final    NO GROWTH 5 DAYS Note: Culture results may be compromised due to an excessive volume of blood received in culture bottles. Performed at [facility]    Report Status 10/20/2014 FINAL  Final  Surgical pcr screen     Status: Abnormal   Collection Time: 10/15/14 11:53 AM  Result Value Ref Range Status   MRSA, PCR POSITIVE (A) NEGATIVE Final   Staphylococcus aureus POSITIVE (A) NEGATIVE Final    Comment:  The Xpert SA Assay (FDA approved for NASAL specimens in patients over 58 years of age), is one component of a comprehensive surveillance program.  Test performance has been validated by [facility] for patients greater than or equal to 1 year  old. It is not intended to diagnose infection nor to guide or monitor treatment.     RADIOLOGY STUDIES/RESULTS: Ct Abdomen Pelvis W Contrast  10/12/2014   CLINICAL DATA:  59yom, lower abd pain, n/v/d, fever, and cysts on buttocks and in his groin. Past medical history includes DM.  EXAM: CT ABDOMEN AND PELVIS WITH CONTRAST  TECHNIQUE: Multidetector CT imaging of the abdomen and pelvis was performed using the standard protocol following bolus administration of intravenous contrast.  CONTRAST:  100mL OMNIPAQUE IOHEXOL 300 MG/ML  SOLN  COMPARISON:  03/24/2006  FINDINGS: There is a heterogeneous collection contiguous with the posterior margin of the mid sigmoid colon measuring 4 cm x 3.3 cm x 3.6 cm. Is contiguous with an area of inflammatory changes small extraluminal bubbles of air that tracks along the course of the central left iliac vessels. The contiguous sigmoid colon shows mild wall thickening and multiple diverticula. Hazy inflammatory changes seen in the adjacent fat. Findings are most consistent with a very diverticular abscess.  There are additional scattered left colon diverticula without other evidence of diverticulitis. Remainder of the colon is unremarkable. Normal appendix is visualized. Normal small bowel.  There are prominent inguinal and external iliac chain lymph nodes. Largest measure 1 cm short axis. No other adenopathy. No ascites.  Lung bases essentially clear. Liver shows diffuse fatty infiltration. No liver mass or focal lesion.  Spleen, gallbladder, pancreas, adrenal glands, kidneys, ureters and bladder: Unremarkable.  Mild degenerative changes noted of the visualized spine. No osteoblastic or osteolytic lesions.  IMPRESSION: 1. Sigmoid colon diverticulitis with a very diverticular abscess measuring 4 cm in greatest dimension and a small amount of adjacent, contained, extraluminal air. 2. No other acute abnormalities. 3. Hepatic steatosis.   Electronically Signed   By: Amie Portlandavid  Ormond  M.D.   On: 10/12/2014 17:15    Jeoffrey MassedGHIMIRE,Kinzy Weyers, MD  [facility] Pager:[phone]  If 7PM-7AM, please contact night-coverage [email or web address] Password [email or web address] 10/22/2014, 11:42 AM   LOS: 10 days

## 2014-10-23 LAB — CBC
HCT: 30.3 % — ABNORMAL LOW (ref 39.0–52.0)
HEMOGLOBIN: 9.4 g/dL — AB (ref 13.0–17.0)
MCH: 29.1 pg (ref 26.0–34.0)
MCHC: 31 g/dL (ref 30.0–36.0)
MCV: 93.8 fL (ref 78.0–100.0)
Platelets: 450 10*3/uL — ABNORMAL HIGH (ref 150–400)
RBC: 3.23 MIL/uL — ABNORMAL LOW (ref 4.22–5.81)
RDW: 13.6 % (ref 11.5–15.5)
WBC: 8.1 10*3/uL (ref 4.0–10.5)

## 2014-10-23 LAB — GLUCOSE, CAPILLARY
Glucose-Capillary: 105 mg/dL — ABNORMAL HIGH (ref 70–99)
Glucose-Capillary: 108 mg/dL — ABNORMAL HIGH (ref 70–99)
Glucose-Capillary: 114 mg/dL — ABNORMAL HIGH (ref 70–99)
Glucose-Capillary: 121 mg/dL — ABNORMAL HIGH (ref 70–99)
Glucose-Capillary: 158 mg/dL — ABNORMAL HIGH (ref 70–99)

## 2014-10-23 MED ORDER — ENSURE ENLIVE PO LIQD
237.0000 mL | Freq: Two times a day (BID) | ORAL | Status: DC
  Administered 2014-10-23: 237 mL via ORAL

## 2014-10-23 MED ORDER — ADULT MULTIVITAMIN W/MINERALS CH
1.0000 | ORAL_TABLET | Freq: Every day | ORAL | Status: DC
  Filled 2014-10-23 (×2): qty 1

## 2014-10-23 MED ORDER — METOPROLOL TARTRATE 50 MG PO TABS
50.0000 mg | ORAL_TABLET | Freq: Two times a day (BID) | ORAL | Status: DC
  Administered 2014-10-23: 50 mg via ORAL
  Filled 2014-10-23 (×3): qty 1

## 2014-10-23 NOTE — Progress Notes (Signed)
Medicare Important Message given? YES  (If response is "NO", the following Medicare IM given date fields will be blank)  Date Medicare IM given: 10/23/14 Medicare IM given by:  Banner Huckaba  

## 2014-10-23 NOTE — Progress Notes (Addendum)
Abdominal dressing was changed. Pt tolerated well and only complains of pain increasing to a 5/10. Pt was given Percocet 10mg  PO per request. Will continue to monitor.

## 2014-10-23 NOTE — Discharge Instructions (Signed)
[facility] °[phone] ° °OPEN ABDOMINAL SURGERY: POST OP INSTRUCTIONS ° °Always review your discharge instruction sheet given to you by the facility where your surgery was performed. ° °IF YOU HAVE DISABILITY OR FAMILY LEAVE FORMS, YOU MUST BRING THEM TO THE OFFICE FOR PROCESSING.  PLEASE DO NOT GIVE THEM TO YOUR DOCTOR. ° °1. A prescription for pain medication may be given to you upon discharge.  Take your pain medication as prescribed, if needed.  If narcotic pain medicine is not needed, then you may take acetaminophen (Tylenol) or ibuprofen (Advil) as needed. °2. Take your usually prescribed medications unless otherwise directed. °3. If you need a refill on your pain medication, please contact your pharmacy. They will contact our office to request authorization.  Prescriptions will not be filled after 5pm or on week-ends. °4. You should follow a light diet the first few days after arrival home, such as soup and crackers, pudding, etc.unless your doctor has advised otherwise. A high-fiber, low fat diet can be resumed as tolerated.   Be sure to include lots of fluids daily. Most patients will experience some swelling and bruising on the chest and neck area.  Ice packs will help.  Swelling and bruising can take several days to resolve °5. Most patients will experience some swelling and bruising in the area of the incision. Ice pack will help. Swelling and bruising can take several days to resolve..  °6. It is common to experience some constipation if taking pain medication after surgery.  Increasing fluid intake and taking a stool softener will usually help or prevent this problem from occurring.  A mild laxative (Milk of Magnesia or Miralax) should be taken according to package directions if there are no bowel movements after 48 hours. °7.  You may have steri-strips (small skin tapes) in place directly over the incision.  These strips should be left on the skin for 7-10 days.  If your  surgeon used skin glue on the incision, you may shower in 24 hours.  The glue will flake off over the next 2-3 weeks.  Any sutures or staples will be removed at the office during your follow-up visit. You may find that a light gauze bandage over your incision may keep your staples from being rubbed or pulled. You may shower and replace the bandage daily. °8. ACTIVITIES:  You may resume regular (light) daily activities beginning the next day--such as daily self-care, walking, climbing stairs--gradually increasing activities as tolerated.  You may have sexual intercourse when it is comfortable.  Refrain from any heavy lifting or straining until approved by your doctor. °a. You may drive when you no longer are taking prescription pain medication, you can comfortably wear a seatbelt, and you can safely maneuver your car and apply brakes °b. Return to Work: ___________________________________ °9. You should see your doctor in the office for a follow-up appointment approximately two weeks after your surgery.  Make sure that you call for this appointment within a day or two after you arrive home to insure a convenient appointment time. °OTHER INSTRUCTIONS:  °_____________________________________________________________ °_____________________________________________________________ ° °WHEN TO CALL YOUR DOCTOR: °1. Fever over 101.0 °2. Inability to urinate °3. Nausea and/or vomiting °4. Extreme swelling or bruising °5. Continued bleeding from incision. °6. Increased pain, redness, or drainage from the incision. °7. Difficulty swallowing or breathing °8. Muscle cramping or spasms. °9. Numbness or tingling in hands or feet or around lips. ° °The clinic staff is available to   answer your questions during regular business hours.  Please don’t hesitate to call and ask to speak to one of the nurses if you have concerns. ° °For further questions, please visit [email or web address] ° °Colostomy Home Guide °A colostomy is an  opening for stool to leave your body when a medical condition prevents it from leaving through the usual opening (rectum). During a surgery, a piece of large intestine (colon) is brought through a hole in the abdominal wall. The new opening is called a stoma or ostomy. A bag or pouch fits over the stoma to catch stool and gas. Your stool may be liquid, somewhat pasty, or formed. °CARING FOR YOUR STOMA  °Normally, the stoma looks a lot like the inside of your cheek: pink, red, and moist. At first it may be swollen, but this swelling will decrease within 6 weeks. °Keep the skin around your stoma clean and dry. You can gently wash your stoma and the skin around your stoma in the shower with a clean, soft washcloth. If you develop any skin irritation, your caregiver may give you a stoma powder or ointment to help heal the area. Do not use any products other than those specifically given to you by your caregiver.  °Your stoma should not be uncomfortable. If you notice any stinging or burning, your pouch may be leaking, and the skin around your stoma may be coming into contact with stool. This can cause skin irritation. If you notice stinging, replace your pouch with a new one and discard the old one. °OSTOMY POUCHES  °The pouch that fits over the ostomy can be made up of either 1 or 2 pieces. A one-piece pouch has a skin barrier piece and the pouch itself in one unit. A two-piece pouch has a skin barrier with a separate pouch that snaps on and off of the skin barrier. Either way, you should empty the pouch when it is only  to ½ full. Do not let more stool or gas build up. This could cause the pouch to leak. °Some ostomy bags have a built-in gas release valve. Ostomy deodorizer (5 drops) can be put into the pouch to prevent odor. Some people use ostomy lubricant drops inside the pouch to help the stool slide out of the bag more easily and completely.  °EMPTYING YOUR OSTOMY POUCH  °You may get lessons on how to empty your  pouch from a wound-ostomy nurse before you leave the hospital. Here are the basic steps: °· Wash your hands with soap and water. °· Sit far back on the toilet. °· Put several pieces of toilet paper into the toilet water. This will prevent splashing as you empty the stool into the toilet bowl. °· Unclip or unvelcro the tail end of the pouch. °· Unroll the tail and empty stool into the toilet. °· Clean the tail with toilet paper. °· Reroll the tail, and clip or velcro it closed. °· Wash your hands again. °CHANGING YOUR OSTOMY POUCH  °Change your ostomy pouch about every 3 to 4 days for the first 6 weeks, then every 5 to7 days. Always change the bag sooner if there is any leakage or you begin to notice any discomfort or irritation of the skin around the stoma. When possible, plan to change your ostomy pouch before eating or drinking as this will lessen the chance of stool coming out during the pouch change. A wound-ostomy nurse may teach you how to change your pouch before you leave the hospital. Here are   the basic steps: °· Lay out your supplies. °· Wash your hands with soap and water. °· Carefully remove the old pouch. °· Wash the stoma and allow it to dry. Men may be advised to shave any hair around the stoma very carefully. This will make the adhesive stick better. °· Use the stoma measuring guide that comes with your pouch set to decide what size hole you will need to cut in the skin barrier piece. Choose the smallest possible size that will hold the stoma but will not touch it. °· Use the guide to trace the circle on the back of the skin barrier piece. Cut out the hole. °· Hold the skin barrier piece over the stoma to make sure the hole is the correct size. °· Remove the adhesive paper backing from the skin barrier piece. °· Squeeze stoma paste around the opening of the skin barrier piece. °· Clean and dry the skin around the stoma again. °· Carefully fit the skin barrier piece over your stoma. °· If you are  using a two-piece pouch, snap the pouch onto the skin barrier piece. °· Close the tail of the pouch. °· Put your hand over the top of the skin barrier piece to help warm it for about 5 minutes, so that it conforms to your body better. °· Wash your hands again. °DIET TIPS  °· Continue to follow your usual diet. °· Drink about eight 8 oz glasses of water each day. °· You can prevent gas by eating slowly and chewing your food thoroughly. °· If you feel concerned that you have too much gas, you can cut back on gas-producing foods, such as: °¨ Spicy foods. °¨ Onions and garlic. °¨ Cruciferous vegetables (cabbage, broccoli, cauliflower, Brussels sprouts). °¨ Beans and legumes. °¨ Some cheeses. °¨ Eggs. °¨ Fish. °¨ Bubbly (carbonated) drinks. °¨ Chewing gum. °GENERAL TIPS  °· You can shower with or without the bag in place. °· Always keep the bag on if you are bathing or swimming. °· If your bag gets wet, you can dry it with a blow-dryer set to cool. °· Avoid wearing tight clothing directly over your stoma so that it does not become irritated or bleed. Tight clothing can also prevent stool from draining into the pouch. °· It is helpful to always have an extra skin barrier and pouch with you when traveling. Do not leave them anywhere too warm, as parts of them can melt. °· Do not let your seat belt rest on your stoma. Try to keep the seat belt either above or below your stoma, or use a tiny pillow to cushion it. °· You can still participate in sports, but you should avoid activities in which there is a risk of getting hit in the abdomen. °· You can still have sex. It is a good idea to empty your pouch prior to sex. Some people and their partners feel very comfortable seeing the pouch during sex. Others choose to wear lingerie or a T-shirt that covers the device. °SEEK IMMEDIATE MEDICAL CARE IF: °· You notice a change in the size or color of the stoma, especially if it becomes very red, purple, black, or pale white. °· You  have bloody stools or bleeding from the stoma. °· You have abdominal pain, nausea, vomiting, or bloating. °· There is anything unusual protruding from the stoma. °· You have irritation or red skin around the stoma. °· No stool is passing from the stoma. °· You have diarrhea (requiring more frequent   than normal pouch emptying). °Document Released: 07/06/2003 Document Revised: 09/25/2011 Document Reviewed: 11/30/2010 °ExitCare® Patient Information ©2015 [employer]. This information is not intended to replace advice given to you by your health care provider. Make sure you discuss any questions you have with your health care provider. ° °Dressing Change °A dressing is a material placed over wounds. It keeps the wound clean, dry, and protected from further injury. This provides an environment that favors wound healing.  °BEFORE YOU BEGIN °· Get your supplies together. Things you may need include: °¨ Saline solution. °¨ Flexible gauze dressing. °¨ Medicated cream. °¨ Tape. °¨ Gloves. °¨ Abdominal dressing pads. °¨ Gauze squares. °¨ Plastic bags. °· Take pain medicine 30 minutes before the dressing change if you need it. °· Take a shower before you do the first dressing change of the day. Use plastic wrap or a plastic bag to prevent the dressing from getting wet. °REMOVING YOUR OLD DRESSING  °· Wash your hands with soap and water. Dry your hands with a clean towel. °· Put on your gloves. °· Remove any tape. °· Carefully remove the old dressing. If the dressing sticks, you may dampen it with warm water to loosen it, or follow your caregiver's specific directions. °· Remove any gauze or packing tape that is in your wound. °· Take off your gloves. °· Put the gloves, tape, gauze, or any packing tape into a plastic bag. °CHANGING YOUR DRESSING °· Open the supplies. °· Take the cap off the saline solution. °· Open the gauze package so that the gauze remains on the inside of the package. °· Put on your gloves. °· Clean your  wound as told by your caregiver. °· If you have been told to keep your wound dry, follow those instructions. °· Your caregiver may tell you to do one or more of the following: °¨ Pick up the gauze. Pour the saline solution over the gauze. Squeeze out the extra saline solution. °¨ Put medicated cream or other medicine on your wound if you have been told to do so. °¨ Put the solution soaked gauze only in your wound, not on the skin around it. °¨ Pack your wound loosely or as told by your caregiver. °¨ Put dry gauze on your wound. °¨ Put abdominal dressing pads over the dry gauze if your wet gauze soaks through. °· Tape the abdominal dressing pads in place so they will not fall off. Do not wrap the tape completely around the affected part (arm, leg, abdomen). °· Wrap the dressing pads with a flexible gauze dressing to secure it in place. °· Take off your gloves. Put them in the plastic bag with the old dressing. Tie the bag shut and throw it away. °· Keep the dressing clean and dry until your next dressing change. °· Wash your hands. °SEEK MEDICAL CARE IF: °· Your skin around the wound looks red. °· Your wound feels more tender or sore. °· You see pus in the wound. °· Your wound smells bad. °· You have a fever. °· Your skin around the wound has a rash that itches and burns. °· You see black or yellow skin in your wound that was not there before. °· You feel nauseous, throw up, and feel very tired. °Document Released: 08/10/2004 Document Revised: 09/25/2011 Document Reviewed: 05/15/2011 °ExitCare® Patient Information ©2015 [employer]. This information is not intended to replace advice given to you by your health care provider. Make sure you discuss any questions you have with your   health care provider. ° °

## 2014-10-23 NOTE — Progress Notes (Signed)
Pt has a pain level of 2/10 at this time. Did give pt Dilaudid 2mg  IV at this time. Will do dressing change at 0600 so this is a pre-medication prior to his dressing change per pt request. Also discussed with pt that if the dressing change does cause his pain to get worse we can also try the Percocet 10mg  afterwards. Pt is satisfied with this plan of care at this time.

## 2014-10-23 NOTE — Progress Notes (Addendum)
NUTRITION FOLLOW UP  Intervention:   -Ensure Enlive po BID, each supplement provides 350 kcal and 20 grams of protein -Multivitamin with minerals daily -RD will continue to monitor   Nutrition Dx:   Inadequate oral intake related to inability to eat as evidenced by NPO; ongoing  Goal:   Pt will meet >90% of estimated nutritional needs; unmet  Monitor:   Acceptance of supplements, PO intake, labs, weight changes, bowel movements  Assessment:   Leroy Chen is a 59 y.o. male with Past medicaVerda Cuminsl history of diabetes mellitus, history of hidradenitis, recurrent skin infections. The patient is presenting with complaints of left groin pain as well as multiple skin lesions. He mentions that he has noted swelling in his right groin as well as a few swellings on his anal area 2 weeks ago. There was no pain. Later on 3-4 days ago this swelling started draining.   S/p Procedure on 10/15/14:  Exploratory laparotomy, sigmoid colectomy, end colostomy, drainage of retroperitoneal abscess   Pt with LLQ colostomy- pt with small amount of output Pt tolerated clear liquid diet. Diet upgraded to soft diet today. Pt reports that he feels hungry for lunch. Ordered a hamburger with fruit. Agreed to try nutritional supplements to improve po intake. Usual body weight reported as 250 lbs.   Height: Ht Readings from Last 1 Encounters:  10/12/14 6\' 2"  (1.88 m)    Weight Status:   Wt Readings from Last 1 Encounters:  10/23/14 242 lb 8.1 oz (110 kg)   10/13/14 249 lb 5.4 oz (113.1 kg)       Re-estimated needs:  Kcal: 2200-2400 Protein: 120-130 grams Fluid: 2.2-2.4 L   Skin: closed abdominal incision, JP drain to rt abdomen, LLQ colostomy  Diet Order: DIET SOFT Room service appropriate?: Yes; Fluid consistency:: Thin   Intake/Output Summary (Last 24 hours) at 10/23/14 1142 Last data filed at 10/23/14 0645  Gross per 24 hour  Intake    140 ml  Output    655 ml  Net   -515 ml     Last BM: PTA   Labs:   Recent Labs Lab 10/17/14 0325 10/18/14 0533 10/20/14 0555  NA 136 139 134*  K 4.3 4.1 3.7  CL 103 107 101  CO2 30 23 26   BUN 7 5* <5*  CREATININE 0.99 0.79 0.85  CALCIUM 7.9* 7.9* 8.4  GLUCOSE 155* 155* 131*    CBG (last 3)   Recent Labs  10/22/14 2359 10/23/14 0641 10/23/14 1132  GLUCAP 114* 105* 108*    Scheduled Meds: . antiseptic oral rinse  7 mL Mouth Rinse BID  . aspirin  81 mg Oral Daily  . ciprofloxacin  500 mg Oral BID  . heparin subcutaneous  5,000 Units Subcutaneous 3 times per day  . insulin aspart  0-15 Units Subcutaneous 4 times per day  . metoprolol tartrate  25 mg Oral BID  . metroNIDAZOLE  500 mg Oral 3 times per day  . pantoprazole  40 mg Oral BID    Continuous Infusions: . 0.9 % NaCl with KCl 20 mEq / L 50 mL/hr (10/22/14 1722)  . lactated ringers 10 mL/hr at 10/15/14 [address]1313    Keely Drennan MS, [place]RD, [place]LDN [phone]

## 2014-10-23 NOTE — Consult Note (Signed)
WOC ostomy follow up Stoma type/location: Colostomy to LLQ from surgery on 3/31 Stomal assessment/size: Stoma red and viable, flush with skin level, 11/2 inches and oval Peristomal assessment: Intact skin surrounding. Output: No stool or flatus, small amt brown liquid stool in pouch Ostomy pouching: 1pc. Education provided: Wife cut pouch and applied barrier ring to maintain seal and one piece pouch without assistance. Pt assisted with pouch application and pt asked appropriate questions and watched procedure. Educational materials left at bedside and 3 sets of barrier rings and 3 pouches  in room for staff nurse use. Pt and wife are able to open and close velcro to empty. Discussed ordering supplies and pouching routines.  Pt states he has been emptying flatus without assistance. He denies further questions at this time and plans to have home health assistance after discharge.  En[place] in [facility] Dis[facility]: Yes Cammie Mcgeeawn Rigoberto Repass MSN, RN, [facility] BerksWOCN, [place], [place] [phone]

## 2014-10-23 NOTE — Progress Notes (Signed)
Patient ID: Leroy Chen, male   DOB: 11/29/1955, 59 y.o.   MRN: [id number] 8 Days Post-Op  Subjective: Pt feels well today.  Tolerating his diet.  Pain well controlled  Objective: Vital signs in last 24 hours: Temp:  [98.3 F (36.8 C)-98.8 F (37.1 C)] 98.8 F (37.1 C) (04/07 2005) Pulse Rate:  [77-84] 77 (04/07 2005) Resp:  [18] 18 (04/07 2005) BP: (140-168)/(60-72) 168/72 mmHg (04/07 2005) SpO2:  [96 %-100 %] 96 % (04/07 2005) Weight:  [110 kg (242 lb 8.1 oz)] 110 kg (242 lb 8.1 oz) (04/08 0539) Last BM Date:  (colostomy sweating)  Intake/Output from previous day: 04/07 0701 - 04/08 0700 In: 140 [P.O.:140] Out: 655 [Urine:550; Drains:105] Intake/Output this shift:    PE: Abd: soft, midline wound clean and packed, JP with only serous drainage.  Bag with thin brown output, but no stool yet  Lab Results:   Recent Labs  10/22/14 0524 10/23/14 0338  WBC 7.4 8.1  HGB 9.9* 9.4*  HCT 31.2* 30.3*  PLT 406* 450*   BMET No results for input(s): NA, K, CL, CO2, GLUCOSE, BUN, CREATININE, CALCIUM in the last 72 hours. PT/INR No results for input(s): LABPROT, INR in the last 72 hours. CMP     Component Value Date/Time   NA 134* 10/20/2014 0555   K 3.7 10/20/2014 0555   CL 101 10/20/2014 0555   CO2 26 10/20/2014 0555   GLUCOSE 131* 10/20/2014 0555   BUN <5* 10/20/2014 0555   CREATININE 0.85 10/20/2014 0555   CALCIUM 8.4 10/20/2014 0555   PROT 7.3 10/13/2014 0515   ALBUMIN 2.9* 10/13/2014 0515   AST 15 10/13/2014 0515   ALT 15 10/13/2014 0515   ALKPHOS 55 10/13/2014 0515   BILITOT 1.8* 10/13/2014 0515   GFRNONAA >90 10/20/2014 0555   GFRAA >90 10/20/2014 0555   Lipase  No results found for: LIPASE     Studies/Results: No results found.  Anti-infectives: Anti-infectives    Start     Dose/Rate Route Frequency Ordered Stop   10/22/14 0800  ciprofloxacin (CIPRO) tablet 500 mg     500 mg Oral 2 times daily 10/22/14 0744     10/22/14 0745  metroNIDAZOLE  (FLAGYL) tablet 500 mg     500 mg Oral 3 times per day 10/22/14 0744     10/16/14 2300  vancomycin (VANCOCIN) IVPB 1000 mg/200 mL premix  Status:  Discontinued     1,000 mg 200 mL/hr over 60 Minutes Intravenous 3 times per day 10/16/14 1646 10/20/14 1730   10/14/14 0200  vancomycin (VANCOCIN) IVPB 1000 mg/200 mL premix  Status:  Discontinued     1,000 mg 200 mL/hr over 60 Minutes Intravenous Every 12 hours 10/13/14 1323 10/16/14 1645   10/13/14 1330  vancomycin (VANCOCIN) IVPB 1000 mg/200 mL premix     1,000 mg 200 mL/hr over 60 Minutes Intravenous STAT 10/13/14 1323 10/13/14 1504   10/13/14 0745  meropenem (MERREM) 1 g in sodium chloride 0.9 % 100 mL IVPB  Status:  Discontinued     1 g 200 mL/hr over 30 Minutes Intravenous 3 times per day 10/13/14 0739 10/22/14 0744   10/13/14 0600  ciprofloxacin (CIPRO) IVPB 400 mg  Status:  Discontinued     400 mg 200 mL/hr over 60 Minutes Intravenous Every 12 hours 10/12/14 2052 10/13/14 0739   10/12/14 2130  doxycycline (VIBRAMYCIN) 100 mg in dextrose 5 % 250 mL IVPB  Status:  Discontinued     100 mg 125  mL/hr over 120 Minutes Intravenous Every 12 hours 10/12/14 2037 10/13/14 0909   10/12/14 2045  metroNIDAZOLE (FLAGYL) IVPB 500 mg  Status:  Discontinued     500 mg 100 mL/hr over 60 Minutes Intravenous Every 8 hours 10/12/14 2033 10/13/14 0739   10/12/14 1800  vancomycin (VANCOCIN) 2,000 mg in sodium chloride 0.9 % 500 mL IVPB     2,000 mg 250 mL/hr over 120 Minutes Intravenous  Once 10/12/14 1736 10/12/14 2040   10/12/14 1745  ciprofloxacin (CIPRO) IVPB 400 mg     400 mg 200 mL/hr over 60 Minutes Intravenous  Once 10/12/14 1736 10/12/14 1946   10/12/14 1745  metroNIDAZOLE (FLAGYL) IVPB 500 mg     500 mg 100 mL/hr over 60 Minutes Intravenous  Once 10/12/14 1736 10/12/14 1904       Assessment/Plan   POD 7 s/p Hartmann procedure for diverticulitis with abscess - 10/15/2014 - Derrell LollingIngram -has bowel function. Will adv to soft  diet -D7 of abx therapy.  Can stop after today - WD dressing changes BID - WOC consult  -on oral pain meds  -HH arranged             -DC JP drain             -can go home when he has a BM in his ostomy, cont miralax DVT prophylaxis -heparin/ SCDs New onset A fib  -per primary service -now in NSR  On Isolation for MRSA  LOS: 11 days    OSBORNE,KELLY E 10/23/2014, 8:11 AM Pager: [phone]  Agree with above. Doing well.  Has liquid stool in ostomy bag.  Ovidio Kinavid Rhodie Cienfuegos, MD, [facility] Pager: [phone] Office phone:  [phone]

## 2014-10-23 NOTE — Progress Notes (Signed)
PATIENT DETAILS Name: Leroy Chen D Glasner Age: 59 y.o. Sex: male Date of Birth: 11/14/1955 Admit Date: 10/12/2014 Admitting Physician Rolly SalterPranav M Patel, MD WUJ:WJXBJYN,WGNFPCP:GRIFFIN,JOHN Jomarie LongsJOSEPH, MD  Subjective: Minimal output from Colostomy  Assessment/Plan: Principal Problem:   Colonic diverticular abscess- s/p colectomy 10/15/14: Admitted and started on empiric antibiotics and other conservative treatment, unfortunately did not make any significant improvement with supportive measures, underwent Hartman's procedure on 10/15/14. Continues to be mostly NPO, has some bowel sounds although sluggish.Currently awaiting return of bowel function. General surgery following. Have discontinued IV Vancomycin Merrem, continue Cipro/Flagyl (day 12 of 14) for now.   Active Problems:   Atrial fibrillation with RVR: Hospital stay complicated by development of atrial fibrillation with rapid ventricular response, started on IV Cardizem and IV heparin. Cardiology consulted, Cardizem has been discontinued, patient now on oral metoprolol.  Continue aspirin as CHADSVASc score is 1. Echocardiogram shows preserved LV function.    Diabetes mellitus, type 2: CBG's stable, continue with SSI for now, resume Amaryl when oral intake has resumed. Will plan on resuming metformin on discharge      Minimally elevated troponins: Likely secondary to demand ischemia in the setting of A. fib with RVR. Cardiology following. Echo shows no wall motion abnormality    Acute renal failure: Likely from prerenal azotemia. Resolved with supportive care.  Disposition: Remain inpatient-Home once bowel function -suspect on 4/9  Antibiotics:  See below   Anti-infectives    Start     Dose/Rate Route Frequency Ordered Stop   10/22/14 0800  ciprofloxacin (CIPRO) tablet 500 mg     500 mg Oral 2 times daily 10/22/14 0744     10/22/14 0745  metroNIDAZOLE (FLAGYL) tablet 500 mg     500 mg Oral 3 times per day 10/22/14 0744     10/16/14 2300   vancomycin (VANCOCIN) IVPB 1000 mg/200 mL premix  Status:  Discontinued     1,000 mg 200 mL/hr over 60 Minutes Intravenous 3 times per day 10/16/14 1646 10/20/14 1730   10/14/14 0200  vancomycin (VANCOCIN) IVPB 1000 mg/200 mL premix  Status:  Discontinued     1,000 mg 200 mL/hr over 60 Minutes Intravenous Every 12 hours 10/13/14 1323 10/16/14 1645   10/13/14 1330  vancomycin (VANCOCIN) IVPB 1000 mg/200 mL premix     1,000 mg 200 mL/hr over 60 Minutes Intravenous STAT 10/13/14 1323 10/13/14 1504   10/13/14 0745  meropenem (MERREM) 1 g in sodium chloride 0.9 % 100 mL IVPB  Status:  Discontinued     1 g 200 mL/hr over 30 Minutes Intravenous 3 times per day 10/13/14 0739 10/22/14 0744   10/13/14 0600  ciprofloxacin (CIPRO) IVPB 400 mg  Status:  Discontinued     400 mg 200 mL/hr over 60 Minutes Intravenous Every 12 hours 10/12/14 2052 10/13/14 0739   10/12/14 2130  doxycycline (VIBRAMYCIN) 100 mg in dextrose 5 % 250 mL IVPB  Status:  Discontinued     100 mg 125 mL/hr over 120 Minutes Intravenous Every 12 hours 10/12/14 2037 10/13/14 0909   10/12/14 2045  metroNIDAZOLE (FLAGYL) IVPB 500 mg  Status:  Discontinued     500 mg 100 mL/hr over 60 Minutes Intravenous Every 8 hours 10/12/14 2033 10/13/14 0739   10/12/14 1800  vancomycin (VANCOCIN) 2,000 mg in sodium chloride 0.9 % 500 mL IVPB     2,000 mg 250 mL/hr over 120 Minutes Intravenous  Once 10/12/14 1736 10/12/14 2040   10/12/14 1745  ciprofloxacin (CIPRO) IVPB 400 mg     400 mg 200 mL/hr over 60 Minutes Intravenous  Once 10/12/14 1736 10/12/14 1946   10/12/14 1745  metroNIDAZOLE (FLAGYL) IVPB 500 mg     500 mg 100 mL/hr over 60 Minutes Intravenous  Once 10/12/14 1736 10/12/14 1904      DVT Prophylaxis: On IV heparin  Code Status: Full code   Family Communication None at bedside  Procedures:  None  CONSULTS:  cardiology and general surgery   MEDICATIONS: Scheduled Meds: . antiseptic oral rinse  7 mL Mouth Rinse BID  .  aspirin  81 mg Oral Daily  . ciprofloxacin  500 mg Oral BID  . heparin subcutaneous  5,000 Units Subcutaneous 3 times per day  . insulin aspart  0-15 Units Subcutaneous 4 times per day  . metoprolol tartrate  25 mg Oral BID  . metroNIDAZOLE  500 mg Oral 3 times per day  . pantoprazole  40 mg Oral BID   Continuous Infusions: . 0.9 % NaCl with KCl 20 mEq / L 50 mL/hr (10/22/14 1722)  . lactated ringers 10 mL/hr at 10/15/14 1313   PRN Meds:.acetaminophen **OR** acetaminophen, alum & mag hydroxide-simeth, hydrALAZINE, HYDROmorphone (DILAUDID) injection, [DISCONTINUED] ondansetron **OR** ondansetron (ZOFRAN) IV, ondansetron **OR** ondansetron (ZOFRAN) IV, oxyCODONE-acetaminophen    PHYSICAL EXAM: Vital signs in last 24 hours: Filed Vitals:   10/22/14 0626 10/22/14 1347 10/22/14 2005 10/23/14 0539  BP: 166/70 140/60 168/72   Pulse: 77 84 77   Temp:  98.3 F (36.8 C) 98.8 F (37.1 C)   TempSrc:  Oral Oral   Resp:  18 18   Height:      Weight:    110 kg (242 lb 8.1 oz)  SpO2:  100% 96%     Weight change: -1 kg (-2 lb 3.3 oz) Filed Weights   10/21/14 0510 10/22/14 0436 10/23/14 0539  Weight: 113.626 kg (250 lb 8 oz) 111 kg (244 lb 11.4 oz) 110 kg (242 lb 8.1 oz)   Body mass index is 31.12 kg/(m^2).   Gen Exam: Awake and alert with clear speech.   Neck: Supple, No JVD.   Chest: B/L Clear.  No rhonchi CVS: S1 S2 Regular, no murmurs.  Abdomen: soft, BS are very sluggish, midline incision with dressing-some distention still present. Dark brown fluid in colostomy today Extremities: no edema, lower extremities warm to touch. Neurologic: Non Focal.   Skin: No Rash.   Wounds: N/A.    Intake/Output from previous day:  Intake/Output Summary (Last 24 hours) at 10/23/14 1146 Last data filed at 10/23/14 0645  Gross per 24 hour  Intake    140 ml  Output    655 ml  Net   -515 ml     LAB RESULTS: CBC  Recent Labs Lab 10/19/14 0446 10/20/14 0555 10/21/14 0547 10/22/14 0524  10/23/14 0338  WBC 9.9 10.0 8.7 7.4 8.1  HGB 9.6* 9.4* 9.9* 9.9* 9.4*  HCT 33.3* 29.7* 31.2* 31.2* 30.3*  PLT 340 415* 450* 406* 450*  MCV 95.7 93.7 93.4 94.0 93.8  MCH 27.6 29.7 29.6 29.8 29.1  MCHC 28.8* 31.6 31.7 31.7 31.0  RDW 13.2 13.1 13.4 13.5 13.6    Chemistries   Recent Labs Lab 10/17/14 0325 10/18/14 0533 10/20/14 0555  NA 136 139 134*  K 4.3 4.1 3.7  CL 103 107 101  CO2 GLUCOSE 155* 155* 131*  BUN 7 5* <5*  CREATININE 0.99 0.79 0.85  CALCIUM 7.9*  7.9* 8.4    CBG:  Recent Labs Lab 10/22/14 1141 10/22/14 1614 10/22/14 2359 10/23/14 0641 10/23/14 1132  GLUCAP 140* 121* 114* 105* 108*    GFR Estimated Creatinine Clearance: 125 mL/min (by C-G formula based on Cr of 0.85).  Coagulation profile No results for input(s): INR, PROTIME in the last 168 hours.  Cardiac Enzymes  Recent Labs Lab 10/17/14 1535 10/17/14 1943 10/17/14 2322  TROPONINI 0.04* 0.04* 0.18*    Invalid input(s): POCBNP No results for input(s): DDIMER in the last 72 hours. No results for input(s): HGBA1C in the last 72 hours. No results for input(s): CHOL, HDL, LDLCALC, TRIG, CHOLHDL, LDLDIRECT in the last 72 hours. No results for input(s): TSH, T4TOTAL, T3FREE, THYROIDAB in the last 72 hours.  Invalid input(s): FREET3 No results for input(s): VITAMINB12, FOLATE, FERRITIN, TIBC, IRON, RETICCTPCT in the last 72 hours. No results for input(s): LIPASE, AMYLASE in the last 72 hours.  Urine Studies No results for input(s): UHGB, CRYS in the last 72 hours.  Invalid input(s): UACOL, UAPR, USPG, UPH, UTP, UGL, UKET, UBIL, UNIT, UROB, ULEU, UEPI, UWBC, URBC, UBAC, CAST, UCOM, BILUA  MICROBIOLOGY: Recent Results (from the past 240 hour(s))  Culture, blood (routine x 2)     Status: None   Collection Time: 10/13/14  5:35 PM  Result Value Ref Range Status   Specimen Description BLOOD LEFT ARM  Final   Special Requests BOTTLES DRAWN AEROBIC AND ANAEROBIC 10 CC EACH  Final    Culture   Final    NO GROWTH 5 DAYS Note: Culture results may be compromised due to an excessive volume of blood received in culture bottles. Performed at [facility]    Report Status 10/20/2014 FINAL  Final  Culture, blood (routine x 2)     Status: None   Collection Time: 10/13/14  5:41 PM  Result Value Ref Range Status   Specimen Description BLOOD LEFT ARM  Final   Special Requests BOTTLES DRAWN AEROBIC AND ANAEROBIC 10 CC EACH  Final   Culture   Final    NO GROWTH 5 DAYS Note: Culture results may be compromised due to an excessive volume of blood received in culture bottles. Performed at [facility]    Report Status 10/20/2014 FINAL  Final  Surgical pcr screen     Status: Abnormal   Collection Time: 10/15/14 11:53 AM  Result Value Ref Range Status   MRSA, PCR POSITIVE (A) NEGATIVE Final   Staphylococcus aureus POSITIVE (A) NEGATIVE Final    Comment:        The Xpert SA Assay (FDA approved for NASAL specimens in patients over 55 years of age), is one component of a comprehensive surveillance program.  Test performance has been validated by [facility] for patients greater than or equal to 64 year old. It is not intended to diagnose infection nor to guide or monitor treatment.     RADIOLOGY STUDIES/RESULTS: Ct Abdomen Pelvis W Contrast  10/12/2014   CLINICAL DATA:  59yom, lower abd pain, n/v/d, fever, and cysts on buttocks and in his groin. Past medical history includes DM.  EXAM: CT ABDOMEN AND PELVIS WITH CONTRAST  TECHNIQUE: Multidetector CT imaging of the abdomen and pelvis was performed using the standard protocol following bolus administration of intravenous contrast.  CONTRAST:  OMNIPAQUE IOHEXOL 300 MG/ML  SOLN  COMPARISON:  03/24/2006  FINDINGS: There is a heterogeneous collection contiguous with the posterior margin of the mid sigmoid colon measuring 4 cm x 3.3  cm x 3.6 cm. Is contiguous with an area of inflammatory changes small extraluminal  bubbles of air that tracks along the course of the central left iliac vessels. The contiguous sigmoid colon shows mild wall thickening and multiple diverticula. Hazy inflammatory changes seen in the adjacent fat. Findings are most consistent with a very diverticular abscess.  There are additional scattered left colon diverticula without other evidence of diverticulitis. Remainder of the colon is unremarkable. Normal appendix is visualized. Normal small bowel.  There are prominent inguinal and external iliac chain lymph nodes. Largest measure 1 cm short axis. No other adenopathy. No ascites.  Lung bases essentially clear. Liver shows diffuse fatty infiltration. No liver mass or focal lesion.  Spleen, gallbladder, pancreas, adrenal glands, kidneys, ureters and bladder: Unremarkable.  Mild degenerative changes noted of the visualized spine. No osteoblastic or osteolytic lesions.  IMPRESSION: 1. Sigmoid colon diverticulitis with a very diverticular abscess measuring 4 cm in greatest dimension and a small amount of adjacent, contained, extraluminal air. 2. No other acute abnormalities. 3. Hepatic steatosis.   Electronically Signed   By: Amie Portland M.D.   On: 10/12/2014 17:15    Jeoffrey Massed, MD  [facility] Pager:[phone]  If 7PM-7AM, please contact night-coverage [email or web address] Password [email or web address] 10/23/2014, 11:46 AM   LOS: 11 days

## 2014-10-24 LAB — CBC
HCT: 32.2 % — ABNORMAL LOW (ref 39.0–52.0)
Hemoglobin: 10.2 g/dL — ABNORMAL LOW (ref 13.0–17.0)
MCH: 29.5 pg (ref 26.0–34.0)
MCHC: 31.7 g/dL (ref 30.0–36.0)
MCV: 93.1 fL (ref 78.0–100.0)
Platelets: 457 10*3/uL — ABNORMAL HIGH (ref 150–400)
RBC: 3.46 MIL/uL — ABNORMAL LOW (ref 4.22–5.81)
RDW: 13.8 % (ref 11.5–15.5)
WBC: 10.2 10*3/uL (ref 4.0–10.5)

## 2014-10-24 LAB — GLUCOSE, CAPILLARY: Glucose-Capillary: 129 mg/dL — ABNORMAL HIGH (ref 70–99)

## 2014-10-24 MED ORDER — METOPROLOL TARTRATE 50 MG PO TABS: 50.0000 mg | ORAL_TABLET | Freq: Two times a day (BID) | ORAL | Status: DC

## 2014-10-24 MED ORDER — ENSURE ENLIVE PO LIQD: 237.0000 mL | Freq: Two times a day (BID) | ORAL | Status: DC

## 2014-10-24 MED ORDER — OXYCODONE-ACETAMINOPHEN 5-325 MG PO TABS: 1.0000 | ORAL_TABLET | Freq: Four times a day (QID) | ORAL | Status: DC | PRN

## 2014-10-24 NOTE — Progress Notes (Signed)
Discharged to home with family office visits in place teaching done  

## 2014-10-24 NOTE — Discharge Summary (Signed)
PATIENT DETAILS Name: Leroy Chen Age: 59 y.o. Sex: male Date of Birth: 02-14-1956 MRN: [id number]. Admitting Physician: [name], MD WUJ:WJXBJYN,WGNF Jomarie Longs, MD  Admit Date: 10/12/2014 Discharge date: 10/24/2014  Recommendations for Outpatient Follow-up:  1. Ensure follow up with gen surgery 2. Follow CBC and BMET-repeat in 1-2 weeks  PRIMARY DISCHARGE DIAGNOSIS:  Principal Problem:   Colonic diverticular abscess- s/p colectomy 10/15/14 Active Problems:   Diabetes mellitus, type 2   Atrial fibrillation with RVR   Smoker   Demand ischemia from AF with RVR-Troponin 0.07      PAST MEDICAL HISTORY: Past Medical History  Diagnosis Date  . Diabetes mellitus without complication   . High cholesterol   . Atrial fibrillation March 2015    DISCHARGE MEDICATIONS: Current Discharge Medication List    START taking these medications   Details  feeding supplement, ENSURE ENLIVE, (ENSURE ENLIVE) LIQD Take 237 mLs by mouth 2 (two) times daily between meals. Qty: 60 Bottle, Refills: 0    metoprolol (LOPRESSOR) 50 MG tablet Take 1 tablet (50 mg total) by mouth 2 (two) times daily. Qty: 60 tablet, Refills: 0    oxyCODONE-acetaminophen (PERCOCET/ROXICET) 5-325 MG per tablet Take 1-2 tablets by mouth every 6 (six) hours as needed for moderate pain. Qty: 30 tablet, Refills: 0      CONTINUE these medications which have NOT CHANGED   Details  aspirin 81 MG tablet Take 81 mg by mouth daily.    glimepiride (AMARYL) 4 MG tablet Take 4 mg by mouth daily before breakfast.     metFORMIN (GLUCOPHAGE) 500 MG tablet Take 500 mg by mouth 2 (two) times daily with a meal.    Multiple Vitamin (MULTIVITAMIN) tablet Take 1 tablet by mouth daily.        ALLERGIES:   Allergies  Allergen Reactions  . Penicillins Swelling    BRIEF HPI:  See H&P, Labs, Consult and Test reports for all details in brief, patient is a 59 y.o. male with Past medical history of diabetes  mellitus-presented with with LLQ pain and subjective fever. Found to have diverticular abscess on further evaluation.  CONSULTATIONS:   general surgery  PERTINENT RADIOLOGIC STUDIES: Ct Abdomen Pelvis W Contrast  10/12/2014   CLINICAL DATA:  59yom, lower abd pain, n/v/d, fever, and cysts on buttocks and in his groin. Past medical history includes DM.  EXAM: CT ABDOMEN AND PELVIS WITH CONTRAST  TECHNIQUE: Multidetector CT imaging of the abdomen and pelvis was performed using the standard protocol following bolus administration of intravenous contrast.  CONTRAST:  OMNIPAQUE IOHEXOL 300 MG/ML  SOLN  COMPARISON:  03/24/2006  FINDINGS: There is a heterogeneous collection contiguous with the posterior margin of the mid sigmoid colon measuring 4 cm x 3.3 cm x 3.6 cm. Is contiguous with an area of inflammatory changes small extraluminal bubbles of air that tracks along the course of the central left iliac vessels. The contiguous sigmoid colon shows mild wall thickening and multiple diverticula. Hazy inflammatory changes seen in the adjacent fat. Findings are most consistent with a very diverticular abscess.  There are additional scattered left colon diverticula without other evidence of diverticulitis. Remainder of the colon is unremarkable. Normal appendix is visualized. Normal small bowel.  There are prominent inguinal and external iliac chain lymph nodes. Largest measure 1 cm short axis. No other adenopathy. No ascites.  Lung bases essentially clear. Liver shows diffuse fatty infiltration. No liver mass or focal lesion.  Spleen, gallbladder, pancreas, adrenal glands, kidneys, ureters  and bladder: Unremarkable.  Mild degenerative changes noted of the visualized spine. No osteoblastic or osteolytic lesions.  IMPRESSION: 1. Sigmoid colon diverticulitis with a very diverticular abscess measuring 4 cm in greatest dimension and a small amount of adjacent, contained, extraluminal air. 2. No other acute  abnormalities. 3. Hepatic steatosis.   Electronically Signed   By: Amie Portland M.D.   On: 10/12/2014 17:15     PERTINENT LAB RESULTS: CBC:  Recent Labs  10/23/14 0338 10/24/14 0419  WBC 8.1 10.2  HGB 9.4* 10.2*  HCT 30.3* 32.2*  PLT 450* 457*   CMET CMP     Component Value Date/Time   NA 134* 10/20/2014 0555   K 3.7 10/20/2014 0555   CL 101 10/20/2014 0555   CO2 26 10/20/2014 0555   GLUCOSE 131* 10/20/2014 0555   BUN <5* 10/20/2014 0555   CREATININE 0.85 10/20/2014 0555   CALCIUM 8.4 10/20/2014 0555   PROT 7.3 10/13/2014 0515   ALBUMIN 2.9* 10/13/2014 0515   AST 15 10/13/2014 0515   ALT 15 10/13/2014 0515   ALKPHOS 55 10/13/2014 0515   BILITOT 1.8* 10/13/2014 0515   GFRNONAA >90 10/20/2014 0555   GFRAA >90 10/20/2014 0555    GFR Estimated Creatinine Clearance: 123.4 mL/min (by C-G formula based on Cr of 0.85). No results for input(s): LIPASE, AMYLASE in the last 72 hours. No results for input(s): CKTOTAL, CKMB, CKMBINDEX, TROPONINI in the last 72 hours. Invalid input(s): POCBNP No results for input(s): DDIMER in the last 72 hours. No results for input(s): HGBA1C in the last 72 hours. No results for input(s): CHOL, HDL, LDLCALC, TRIG, CHOLHDL, LDLDIRECT in the last 72 hours. No results for input(s): TSH, T4TOTAL, T3FREE, THYROIDAB in the last 72 hours.  Invalid input(s): FREET3 No results for input(s): VITAMINB12, FOLATE, FERRITIN, TIBC, IRON, RETICCTPCT in the last 72 hours. Coags: No results for input(s): INR in the last 72 hours.  Invalid input(s): PT Microbiology: Recent Results (from the past 240 hour(s))  Surgical pcr screen     Status: Abnormal   Collection Time: 10/15/14 11:53 AM  Result Value Ref Range Status   MRSA, PCR POSITIVE (A) NEGATIVE Final   Staphylococcus aureus POSITIVE (A) NEGATIVE Final    Comment:        The Xpert SA Assay (FDA approved for NASAL specimens in patients over 37 years of age), is one component of a comprehensive  surveillance program.  Test performance has been validated by [facility] for patients greater than or equal to 50 year old. It is not intended to diagnose infection nor to guide or monitor treatment.      BRIEF HOSPITAL COURSE:  Colonic diverticular abscess- s/p colectomy 10/15/14: Admitted and started on empiric antibiotics and other conservative treatment, unfortunately did not make any significant improvement with supportive measures, underwent Hartman's procedure on 10/15/14. Postoperatively made NPO, diet gradually advanced, by day of discharge tolerating regular diet. Completed more than 1 week of Abx post op, hence will not be discharged on any further antibiotics. Now has stools in colostomy as well. Stable for discharge, with outpatient follow up with Gen Surgery.  Active Problems:  Atrial fibrillation with RVR: Hospital stay complicated by development of atrial fibrillation with rapid ventricular response, started on IV Cardizem and IV heparin. Cardiology consulted, Cardizem was discontinued once heart rate was optimal, patient now on oral metoprolol. Continue aspirin as CHADSVASc score is 1. Echocardiogram shows preserved LV function.   Diabetes mellitus, type 2: CBG's stable,managed with SSI,  resume Amaryl and Metformin on discharge   Minimally elevated troponins: Likely secondary to demand ischemia in the setting of A. fib with RVR. Cardiology consulted-no further recommendations. Echo shows no wall motion abnormality   Acute renal failure: Likely from prerenal azotemia. Resolved with supportive  TODAY-DAY OF DISCHARGE:  Subjective:   Leroy Chen today has no headache,no chest abdominal pain,no new weakness tingling or numbness, feels much better wants to go home today.  Objective:   Blood pressure 128/62, pulse 73, temperature 98.7 F (37.1 C), temperature source Oral, resp. rate 18, height 6\' 2"  (1.88 m), weight 106.913 kg (235 lb 11.2 oz), SpO2 98 %. No intake  or output data in the 24 hours ending 10/24/14 0824 Filed Weights   10/22/14 0436 10/23/14 0539 10/24/14 0536  Weight: 111 kg (244 lb 11.4 oz) 110 kg (242 lb 8.1 oz) 106.913 kg (235 lb 11.2 oz)    Exam Awake Alert, Oriented *3, No new F.N deficits, Normal affect .AT,PERRAL Supple Neck,No JVD, No cervical lymphadenopathy appriciated.  Symmetrical Chest wall movement, Good air movement bilaterally, CTAB RRR,No Gallops,Rubs or new Murmurs, No Parasternal Heave +ve B.Sounds, Abd Soft, Non tender, No organomegaly appriciated, No rebound -guarding or rigidity. No Cyanosis, Clubbing or edema, No new Rash or bruise  DISCHARGE CONDITION: Stable  DISPOSITION: Home  DISCHARGE INSTRUCTIONS:    Activity:  As tolerated  Diet recommendation: Diabetic Diet Heart Healthy diet   Discharge Instructions    Call MD for:  redness, tenderness, or signs of infection (pain, swelling, redness, odor or green/yellow discharge around incision site)    Complete by:  As directed      Diet - low sodium heart healthy    Complete by:  As directed      Increase activity slowly    Complete by:  As directed            Follow-up Information    Follow up with Ernestene MentionINGRAM,HAYWOOD M, MD On 11/10/2014.   Specialty:  General Surgery   Why:  2:45pm, arrive no later than 2:15pm for paperwork   Contact information:   [address] [place] [address] [phone]       Follow up with [facility].   Why:  HH-RN arranged   Contact information:   [address] [place] [address] [phone]       Follow up with Lillia MountainGRIFFIN,JOHN JOSEPH, MD In 1 week.   Specialty:  Internal Medicine   Contact information:   [address] [place] [address] [phone]         Total Time spent on discharge equals 45 minutes.  SignedJeoffrey Massed: Irean Kendricks 10/24/2014 8:24 AM

## 2014-10-24 NOTE — Progress Notes (Signed)
Going home today Moving bowels HHN and follow up already scheduled.

## 2014-11-07 ENCOUNTER — Encounter (HOSPITAL_COMMUNITY): Payer: Self-pay | Admitting: Emergency Medicine

## 2014-11-07 ENCOUNTER — Emergency Department (HOSPITAL_COMMUNITY)
Admission: EM | Admit: 2014-11-07 | Discharge: 2014-11-07 | Disposition: A | Discharge status: Home / Self Care | Payer: 59 | Source: Home / Self Care | Attending: Family Medicine | Admitting: Family Medicine

## 2014-11-07 DIAGNOSIS — L02412 Cutaneous abscess of left axilla: Secondary | ICD-10-CM

## 2014-11-07 DIAGNOSIS — L0231 Cutaneous abscess of buttock: Secondary | ICD-10-CM

## 2014-11-07 MED ORDER — TRAMADOL HCL 50 MG PO TABS: 50.0000 mg | ORAL_TABLET | Freq: Four times a day (QID) | ORAL | Status: DC | PRN

## 2014-11-07 MED ORDER — SULFAMETHOXAZOLE-TRIMETHOPRIM 800-160 MG PO TABS: 2.0000 | ORAL_TABLET | Freq: Two times a day (BID) | ORAL | Status: DC

## 2014-11-07 NOTE — ED Provider Notes (Signed)
Leroy Chen is a 59 y.o. male who presents to Urgent Care today for abscess. Patient is here with recurrent abscess in his left axilla and his right gluteal cleft. He has a history of recurrent abscesses in these areas. Additionally he was recently discharged from the hospital with abdominal abscess requiring surgery. No fevers or chills vomiting or diarrhea. Symptoms have been present for about 2 weeks and worsening recently. Symptoms are moderate.   Past Medical History  Diagnosis Date  . Diabetes mellitus without complication   . High cholesterol   . Atrial fibrillation March 2015   Past Surgical History  Procedure Laterality Date  . Hand sugery    . Orchiectomy    . Colectomy  10/15/14  . Laparotomy N/A 10/15/2014    Procedure: EXPLORATORY LAPAROTOMY WITH DRAINAGE OF INTRA- ABDOMINAL ABSCESS;  Surgeon: Claud KelpHaywood Ingram, MD;  Location: [facility] OR;  Service: General;  Laterality: N/A;  . Partial colectomy N/A 10/15/2014    Procedure: SIGMOID COLECTOMY WITH COLOSTOMY CREATION;  Surgeon: Claud KelpHaywood Ingram, MD;  Location: [facility] OR;  Service: General;  Laterality: N/A;   History  Substance Use Topics  . Smoking status: Current Every Day Smoker    Types: Cigarettes  . Smokeless tobacco: Never Used  . Alcohol Use: Yes     Comment: Social   ROS as above Medications: No current facility-administered medications for this encounter.   Current Outpatient Prescriptions  Medication Sig Dispense Refill  . aspirin 81 MG tablet Take 81 mg by mouth daily.    . feeding supplement, ENSURE ENLIVE, (ENSURE ENLIVE) LIQD Take 237 mLs by mouth 2 (two) times daily between meals. 60 Bottle 0  . glimepiride (AMARYL) 4 MG tablet Take 4 mg by mouth daily before breakfast.     . metFORMIN (GLUCOPHAGE) 500 MG tablet Take 500 mg by mouth 2 (two) times daily with a meal.    . metoprolol (LOPRESSOR) 50 MG tablet Take 1 tablet (50 mg total) by mouth 2 (two) times daily. 60 tablet 0  . Multiple Vitamin (MULTIVITAMIN)  tablet Take 1 tablet by mouth daily.    Marland Kitchen. oxyCODONE-acetaminophen (PERCOCET/ROXICET) 5-325 MG per tablet Take 1-2 tablets by mouth every 6 (six) hours as needed for moderate pain. 30 tablet 0  . sulfamethoxazole-trimethoprim (BACTRIM DS,SEPTRA DS) 800-160 MG per tablet Take 2 tablets by mouth 2 (two) times daily. 28 tablet 0   Allergies  Allergen Reactions  . Penicillins Swelling     Exam:  BP 128/80 mmHg  Pulse 73  Temp(Src) 98.3 F (36.8 C) (Oral)  Resp 16  SpO2 99% Gen: Well NAD Skin: Left axilla scarred axillary area with a area of fluctuance with surrounding erythema and tenderness. No pus expressed. Right gluteal cleft. Scarred. Tender fluctuant area left gluteal cleft. No induration palpated.  Abscess incision and drainage.  Left axillary area Consent obtained and timeout performed. Skin cleaned with alcohol, and cold spray applied. 2 mL of lidocaine with epi injected achieving good anesthesia. Skin was again cleaned with alcohol. A sharp incision was made to the area of fluctuance. The incision was widened and pus was expressed.  Blunt dissection was used to break up loculations. Further pus was expressed. Patient tolerated the procedure well. A dressing was applied  Abscess incision and drainage. Right gluteal cleft Consent obtained and timeout performed. Skin cleaned with alcohol, and cold spray applied. 3 mL of lidocaine with epi injected achieving good anesthesia. Skin was again cleaned with alcohol. A sharp incision was made to the  area of fluctuance. The incision was widened and pus was expressed. Pus was cultured. Blunt dissection was used to break up loculations. Further pus was expressed. Patient tolerated the procedure well. A dressing was applied   No results found for this or any previous visit (from the past 24 hour(s)). No results found.  Assessment and Plan: 59 y.o. male with recurrent abscess of the left axilla and right gluteal cleft incised and  drained. Culture from gluteal cleft pending. Treat with Bactrim follow-up with PCP.  Discussed warning signs or symptoms. Please see discharge instructions. Patient expresses understanding.     Rodolph Bong, MD 11/07/14 1245

## 2014-11-07 NOTE — Discharge Instructions (Signed)
Thank you for coming in today. Take Bactrim antibiotic. Use tramadol for severe pain.  Abscess Care After An abscess (also called a boil or furuncle) is an infected area that contains a collection of pus. Signs and symptoms of an abscess include pain, tenderness, redness, or hardness, or you may feel a moveable soft area under your skin. An abscess can occur anywhere in the body. The infection may spread to surrounding tissues causing cellulitis. A cut (incision) by the surgeon was made over your abscess and the pus was drained out. Gauze may have been packed into the space to provide a drain that will allow the cavity to heal from the inside outwards. The boil may be painful for 5 to 7 days. Most people with a boil do not have high fevers. Your abscess, if seen early, may not have localized, and may not have been lanced. If not, another appointment may be required for this if it does not get better on its own or with medications. HOME CARE INSTRUCTIONS   Only take over-the-counter or prescription medicines for pain, discomfort, or fever as directed by your caregiver.  When you bathe, soak and then remove gauze or iodoform packs at least daily or as directed by your caregiver. You may then wash the wound gently with mild soapy water. Repack with gauze or do as your caregiver directs. SEEK IMMEDIATE MEDICAL CARE IF:   You develop increased pain, swelling, redness, drainage, or bleeding in the wound site.  You develop signs of generalized infection including muscle aches, chills, fever, or a general ill feeling.  An oral temperature above 102 F (38.9 C) develops, not controlled by medication. See your caregiver for a recheck if you develop any of the symptoms described above. If medications (antibiotics) were prescribed, take them as directed. Document Released: 01/19/2005 Document Revised: 09/25/2011 Document Reviewed: 09/16/2007 [facility] Patient Information 2015 [place], [place]. This  information is not intended to replace advice given to you by your health care provider. Make sure you discuss any questions you have with your health care provider.

## 2014-11-07 NOTE — ED Notes (Signed)
C/o cyst on back and under left arm See doctor notes

## 2014-11-10 LAB — CULTURE, ROUTINE-ABSCESS

## 2014-11-10 NOTE — ED Notes (Addendum)
Abscess culture R buttock: Few E. Coli.  Pt. adequately treated with I and D and Bactrim DS. Vassie MoselleYork, Ugo Thoma M 11/10/2014

## 2014-11-26 ENCOUNTER — Emergency Department (INDEPENDENT_AMBULATORY_CARE_PROVIDER_SITE_OTHER)
Admission: EM | Admit: 2014-11-26 | Discharge: 2014-11-26 | Disposition: A | Discharge status: Home / Self Care | Payer: 59 | Source: Home / Self Care | Attending: Family Medicine | Admitting: Family Medicine

## 2014-11-26 ENCOUNTER — Encounter (HOSPITAL_COMMUNITY): Payer: Self-pay | Admitting: Emergency Medicine

## 2014-11-26 DIAGNOSIS — K604 Rectal fistula: Secondary | ICD-10-CM

## 2014-11-26 NOTE — Discharge Instructions (Signed)
Thank you for coming in today.  Follow up with the General Surgeons to evaluate for an Anal Fistula.  Anal Fistula An anal fistula is an abnormal tunnel that develops between the bowel and skin near the outside of the anus, where feces comes out. The anus has a number of tiny glands that make lubricating fluid. Sometimes these glands can become plugged and infected. This may lead to the development of a fluid-filled pocket (abscess). An anal fistula often develops after this infection or abscess. It is nearly always caused by a past or current anal abscess.  CAUSES  Though an anal fistula is almost always caused by a past or current anal abscess, other causes can include:  A complication of surgery.  Trauma to the rectal area.  Radiation to the area.  Other medical conditions or diseases, such as:   Chronic inflammatory bowel disease, such as Crohn disease or ulcerative colitis.   Colon or rectal cancer.   Diverticular disease, such as diverticulitis.   A sexually transmitted disease, such as gonorrhea, chlamydia, or syphilis.  An HIV infection or AIDS.  SYMPTOMS   Throbbing or constant pain that may be worse when sitting.   Swelling or irritation around the anus.   Drainage of pus or blood from an opening near the anus.   Pain with bowel movements.  Fever or chills. DIAGNOSIS  Your caregiver will examine the area to find the openings of the anal fistula and the fistula tract. The external opening of the anal fistula may be seen during a physical examination. Other examinations that may be performed include:   Examination of the rectal area with a gloved hand (digital rectal exam).   Examination with a probe or scope to help locate the internal opening of the fistula.   Injection of a dye into the fistula opening. X-rays can be taken to find the exact location and path of the fistula.   An MRI or ultrasound of the anal area.  Other tests may be performed to  find the cause of the anal fistula.   TREATMENT  The most common treatment for an anal fistula is surgery. There are different surgery options depending on where your fistula is located and how complex the fistula is. Surgical options include:  A fistulotomy. This surgery involves opening up the whole fistula and draining the contents inside to promote healing.  Seton placement. A silk string (seton) is placed into the fistula during a fistulotomy to drain any infection to promote healing.  Advancement flap procedure. Tissue is removed from your rectum or the skin around the anus and is attached to the opening of the fistula.  Bioprosthetic plug. A cone-shaped plug is made from your tissue and is used to block the opening of the fistula. Some anal fistulas do not require surgery. A fibrin glue is a non-surgical option that involves injecting the glue to seal the fistula. You also may be prescribed an antibiotic medicine to treat an infection.  HOME CARE INSTRUCTIONS   Take your antibiotics as directed. Finish them even if you start to feel better.  Only take over-the-counter or prescription medicines as directed by your caregiver.Use a stool softener or laxative, if recommended.   Eat a high-fiber diet to help avoid constipation or as directed by your caregiver.  Drink enough water to keep your urine clear or pale yellow.   A warm sitz bath may be soothing and help with healing. You may take warm sitz baths for 15-20  minutes, 3-4 times a day to ease pain and discomfort.   Follow excellent hygiene to keep the anal area as clean and dry as possible. Use wet toilet paper or moist towelettes after each bowel movement.  SEEK MEDICAL CARE IF: You have increased pain not controlled with medicines.  SEEK IMMEDIATE MEDICAL CARE IF:  You have severe, intolerable pain.  You have new swelling, redness, or discharge around the anal area.  You have tenderness or warmth around the anal  area.  You have chills or diarrhea.  You have severe problems urinating or having a bowel movement.   You have a fever or persistent symptoms for more than 2-3 days.   You have a fever and your symptoms suddenly get worse.  MAKE SURE YOU:   Understand these instructions.  Will watch your condition.  Will get help right away if you are not doing well or get worse. Document Released: 06/15/2008 Document Revised: 06/19/2012 Document Reviewed: 05/08/2011 [facility] Patient Information 2015 [place], [place]. This information is not intended to replace advice given to you by your health care provider. Make sure you discuss any questions you have with your health care provider.

## 2014-11-26 NOTE — ED Notes (Signed)
Here for follow up on abscess on buttocks  States area keeps draining

## 2014-11-26 NOTE — ED Provider Notes (Signed)
Leroy Chen is a 59 y.o. male who presents to Urgent Care today for buttocks abscess. Patient was seen in late April for a blood ox abscess was incised and drained. Culture results show Escherichia coli. He's had chronic drainage from the wound since. He denies any fevers or chills nausea vomiting or diarrhea. He has a recent colectomy and colostomy bag due to a intra-abdominal abscess in March. No fevers or chills vomiting or diarrhea.   Past Medical History  Diagnosis Date  . Diabetes mellitus without complication   . High cholesterol   . Atrial fibrillation March 2015   Past Surgical History  Procedure Laterality Date  . Hand sugery    . Orchiectomy    . Colectomy  10/15/14  . Laparotomy N/A 10/15/2014    Procedure: EXPLORATORY LAPAROTOMY WITH DRAINAGE OF INTRA- ABDOMINAL ABSCESS;  Surgeon: Claud KelpHaywood Ingram, MD;  Location: [facility] OR;  Service: General;  Laterality: N/A;  . Partial colectomy N/A 10/15/2014    Procedure: SIGMOID COLECTOMY WITH COLOSTOMY CREATION;  Surgeon: Claud KelpHaywood Ingram, MD;  Location: [facility] OR;  Service: General;  Laterality: N/A;   History  Substance Use Topics  . Smoking status: Current Every Day Smoker    Types: Cigarettes  . Smokeless tobacco: Never Used  . Alcohol Use: Yes     Comment: Social   ROS as above Medications: No current facility-administered medications for this encounter.   Current Outpatient Prescriptions  Medication Sig Dispense Refill  . aspirin 81 MG tablet Take 81 mg by mouth daily.    . feeding supplement, ENSURE ENLIVE, (ENSURE ENLIVE) LIQD Take 237 mLs by mouth 2 (two) times daily between meals. 60 Bottle 0  . glimepiride (AMARYL) 4 MG tablet Take 4 mg by mouth daily before breakfast.     . metFORMIN (GLUCOPHAGE) 500 MG tablet Take 500 mg by mouth 2 (two) times daily with a meal.    . metoprolol (LOPRESSOR) 50 MG tablet Take 1 tablet (50 mg total) by mouth 2 (two) times daily. 60 tablet 0  . Multiple Vitamin (MULTIVITAMIN) tablet Take 1  tablet by mouth daily.    Marland Kitchen. oxyCODONE-acetaminophen (PERCOCET/ROXICET) 5-325 MG per tablet Take 1-2 tablets by mouth every 6 (six) hours as needed for moderate pain. 30 tablet 0  . sulfamethoxazole-trimethoprim (BACTRIM DS,SEPTRA DS) 800-160 MG per tablet Take 2 tablets by mouth 2 (two) times daily. 28 tablet 0  . traMADol (ULTRAM) 50 MG tablet Take 1 tablet (50 mg total) by mouth every 6 (six) hours as needed. 10 tablet 0   Allergies  Allergen Reactions  . Penicillins Swelling     Exam:  BP 152/81 mmHg  Pulse 86  Temp(Src) 99 F (37.2 C) (Oral)  Resp 20  SpO2 98% Gen: Well NAD Skin perirectal skin with chronic drainage no induration or fluctuance. Draining from a site near the gluteal cleft nontender.  No results found for this or any previous visit (from the past 24 hour(s)). No results found.  Assessment and Plan: 59 y.o. 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 Follow-up with general surgery for further evaluation and management.  Discussed warning signs or symptoms. Please see discharge instructions. Patient expresses understanding.     Rodolph BongEvan S Varsha Knock, MD 11/26/14 [phone]

## 2014-11-28 ENCOUNTER — Other Ambulatory Visit: Payer: Self-pay | Admitting: Internal Medicine

## 2014-12-16 ENCOUNTER — Other Ambulatory Visit: Payer: Self-pay | Admitting: General Surgery

## 2014-12-16 NOTE — H&P (Signed)
Leroy BalAnthony D. Mcnamee 12/16/2014 1:35 PM Location: [facility] Patient #: [id number] DOB: 10/11/1955 Married / Language: English / Race: Black or African American Male History of Present Illness Leroy Chen(Oliviana Mcgahee MD; 12/16/2014 1:58 PM) Patient words: perirectal fistula.  The patient is a 59 year old male who presents with a complaint of fistula. Patient presents to the office with continued drainage from a perirectal abscess site that I&D was performed approximately one month previously. He continues to have. Drainage on daily basis. Of note the patient is diverted currently due to an episode of diverticulitis. This surgery was performed on 10/15/2014. Patient has a history of pilonidal cyst in the past. He denies any history of rectal surgery. Problem List/Past Medical Leroy Chen(Kissa Campoy, MD; 12/16/2014 1:58 PM) PILONIDAL DISEASE (709.8  L98.8) We will call you with a date for surgery. DIABETES MELLITUS TYPE 2, CONTROLLED, WITHOUT COMPLICATIONS (250.00  E11.9) DIVERTICULITIS OF COLON WITH PERFORATION (562.11  K57.20) ATRIAL FIBRILLATION WITH RVR (427.31  I48.91) TOBACCO ABUSE (305.1  Z72.0) HYPERLIPIDEMIA, ACQUIRED (272.4  E78.5)  Other Problems Leroy Chen(Nehemiah Montee, MD; 12/16/2014 1:58 PM) Atrial Fibrillation Diabetes Mellitus Diverticulosis High blood pressure  Past Surgical History Michel Bickers(Kelly Dockery, LPN; 0/4/54096/07/2014 8:111:36 PM) Resection of Small Bowel  Diagnostic Studies History Michel Bickers(Kelly Dockery, LPN; 9/1/47826/07/2014 9:561:36 PM) Colonoscopy 1-5 years ago  Allergies Michel Bickers(Kelly Dockery, LPN; 2/1/30866/07/2014 5:781:37 PM) Penicillins  Medication History Leroy Chen(Maricus Tanzi, MD; 12/16/2014 1:58 PM) Aspirin Childrens (81MG  Tablet Chewable, Oral) Active. Amaryl (4MG  Tablet, Oral) Active. Glucophage (500MG  Tablet, Oral) Active. Multivitamin Adults 50+ (Oral) Active. Medications Reconciled Cipro (500MG  Tablet, 1 (one) Tablet Oral two times daily, Taken starting 12/16/2014) Active. Flagyl (500MG  Tablet, 1  (one) Tablet Oral bid, Taken starting 12/16/2014) Active. Lopressor (50MG  Tablet, Oral) Active.  Social History Leroy Chen(Shenise Wolgamott, MD; 12/16/2014 1:58 PM) Alcohol use Occasional alcohol use. Caffeine use Carbonated beverages, Coffee. Illicit drug use Remotely quit drug use. Tobacco use Current every day smoker, Current some day smoker. No drug use  Family History Leroy Chen(Brittany Amirault, MD; 12/16/2014 1:58 PM) Arthritis Father. Diabetes Mellitus Brother, Father, Mother, Sister. Heart Disease Brother, Mother. Hypertension Father. Heart disease in male family member before age 265     Review of Systems Michel Bickers(Kelly Dockery LPN; 4/6/96296/07/2014 5:281:36 PM) General Not Present- Appetite Loss, Chills, Fatigue, Fever, Night Sweats, Weight Gain and Weight Loss. Skin Present- New Lesions. Not Present- Change in Wart/Mole, Dryness, Hives, Jaundice, Non-Healing Wounds, Rash and Ulcer. HEENT Present- Ringing in the Ears and Wears glasses/contact lenses. Not Present- Earache, Hearing Loss, Hoarseness, Nose Bleed, Oral Ulcers, Seasonal Allergies, Sinus Pain, Sore Throat, Visual Disturbances and Yellow Eyes. Respiratory Present- Snoring. Not Present- Bloody sputum, Chronic Cough, Difficulty Breathing and Wheezing. Breast Not Present- Breast Mass, Breast Pain, Nipple Discharge and Skin Changes. Cardiovascular Not Present- Chest Pain, Difficulty Breathing Lying Down, Leg Cramps, Palpitations, Rapid Heart Rate, Shortness of Breath and Swelling of Extremities. Gastrointestinal Present- Rectal Pain. Not Present- Abdominal Pain, Bloating, Bloody Stool, Change in Bowel Habits, Chronic diarrhea, Constipation, Difficulty Swallowing, Excessive gas, Gets full quickly at meals, Hemorrhoids, Indigestion, Nausea and Vomiting. Male Genitourinary Present- Impotence. Not Present- Blood in Urine, Change in Urinary Stream, Frequency, Nocturia, Painful Urination, Urgency and Urine Leakage. Musculoskeletal Present- Joint Stiffness. Not  Present- Back Pain, Joint Pain, Muscle Pain, Muscle Weakness and Swelling of Extremities. Neurological Not Present- Decreased Memory, Fainting, Headaches, Numbness, Seizures, Tingling, Tremor, Trouble walking and Weakness. Psychiatric Not Present- Anxiety, Bipolar, Change in Sleep Pattern, Depression, Fearful and Frequent crying. Endocrine Not Present- Cold Intolerance, Excessive Hunger,  Hair Changes, Heat Intolerance, Hot flashes and New Diabetes. Hematology Present- Persistent Infections. Not Present- Easy Bruising, Excessive bleeding, Gland problems and HIV.  Vitals Tresa Endo Dockery LPN; 5/0/3546 5:68 PM) 12/16/2014 1:36 PM Weight: 238.6 lb Height: 73in Body Surface Area: 2.36 m Body Mass Index: 31.48 kg/m Temp.: 99.47F(Oral)  Pulse: 93 (Regular)  BP: 126/84 (Sitting, Left Arm, Standard)     Physical Exam Leroy Levee MD; 12/16/2014 2:00 PM)  General Mental Status-Alert. General Appearance-Consistent with stated age. Hydration-Well hydrated. Voice-Normal.  Head and Neck Head-normocephalic, atraumatic with no lesions or palpable masses. Trachea-midline. Thyroid Gland Characteristics - normal size and consistency.  Eye Eyeball - Bilateral-Extraocular movements intact. Sclera/Conjunctiva - Bilateral-No scleral icterus.  Chest and Lung Exam Chest and lung exam reveals -quiet, even and easy respiratory effort with no use of accessory muscles and on auscultation, normal breath sounds, no adventitious sounds and normal vocal resonance. Inspection Chest Wall - Normal. Back - normal.  Cardiovascular Cardiovascular examination reveals -normal heart sounds, regular rate and rhythm with no murmurs and normal pedal pulses bilaterally.  Abdomen Inspection Inspection of the abdomen reveals - No Hernias. Palpation/Percussion Palpation and Percussion of the abdomen reveal - Soft, Non Tender, No Rebound tenderness, No Rigidity (guarding) and No  hepatosplenomegaly. Auscultation Auscultation of the abdomen reveals - Bowel sounds normal.  Rectal Anorectal Exam External - Note: Patient with purulent drainage from the sacral region. Most consistent with pilonidal disease. Minimal perirectal inflammation noted posteriorly.  Neurologic Neurologic evaluation reveals -alert and oriented x 3 with no impairment of recent or remote memory. Mental Status-Normal.  Musculoskeletal Global Assessment -Note:no gross deformities.  Normal Exam - Left-Upper Extremity Strength Normal and Lower Extremity Strength Normal. Normal Exam - Right-Upper Extremity Strength Normal and Lower Extremity Strength Normal.    Assessment & Plan Leroy Levee MD; 12/16/2014 1:58 PM)  PILONIDAL DISEASE (709.8  L98.8) Story: We will call you with a date for surgery. Impression: The 60 year old male who is currently diverted due to diverticulitis who presents to the office with a chronically draining abscess. On exam this seems to be related to his pilonidal disease. I have recommended an exam under anesthesia with possible excision of pilonidal disease versus fistulotomy or seton placement. We discussed the risk of this which are bleeding, infection, damage to adjacent structures and recurrence. I believe he understands these risks and has agreed to proceed with surgery.

## 2014-12-29 ENCOUNTER — Encounter (HOSPITAL_BASED_OUTPATIENT_CLINIC_OR_DEPARTMENT_OTHER): Payer: Self-pay | Admitting: *Deleted

## 2014-12-29 NOTE — Progress Notes (Signed)
Spoke w wife, hx obtained. Instructed pt to be npo p mn 6/16 x lopressor, cipro and flagyl w sip of water.  Wife unsure if pt takes lopressor currently.  ekg in chart.  Needs istat on arrival.

## 2015-01-01 ENCOUNTER — Encounter (HOSPITAL_BASED_OUTPATIENT_CLINIC_OR_DEPARTMENT_OTHER)
Admission: RE | Disposition: A | Discharge status: Home / Self Care | Payer: Self-pay | Source: Ambulatory Visit | Attending: General Surgery

## 2015-01-01 ENCOUNTER — Ambulatory Visit (HOSPITAL_BASED_OUTPATIENT_CLINIC_OR_DEPARTMENT_OTHER): Payer: 59 | Admitting: Anesthesiology

## 2015-01-01 ENCOUNTER — Encounter (HOSPITAL_BASED_OUTPATIENT_CLINIC_OR_DEPARTMENT_OTHER): Payer: Self-pay | Admitting: Anesthesiology

## 2015-01-01 ENCOUNTER — Ambulatory Visit (HOSPITAL_BASED_OUTPATIENT_CLINIC_OR_DEPARTMENT_OTHER)
Admission: RE | Admit: 2015-01-01 | Discharge: 2015-01-01 | Disposition: A | Discharge status: Home / Self Care | Payer: 59 | Source: Ambulatory Visit | Attending: General Surgery | Admitting: General Surgery

## 2015-01-01 DIAGNOSIS — I4891 Unspecified atrial fibrillation: Secondary | ICD-10-CM | POA: Insufficient documentation

## 2015-01-01 DIAGNOSIS — Z79899 Other long term (current) drug therapy: Secondary | ICD-10-CM | POA: Diagnosis not present

## 2015-01-01 DIAGNOSIS — F17200 Nicotine dependence, unspecified, uncomplicated: Secondary | ICD-10-CM | POA: Insufficient documentation

## 2015-01-01 DIAGNOSIS — L720 Epidermal cyst: Secondary | ICD-10-CM | POA: Diagnosis not present

## 2015-01-01 DIAGNOSIS — Z792 Long term (current) use of antibiotics: Secondary | ICD-10-CM | POA: Diagnosis not present

## 2015-01-01 DIAGNOSIS — Z9049 Acquired absence of other specified parts of digestive tract: Secondary | ICD-10-CM | POA: Insufficient documentation

## 2015-01-01 DIAGNOSIS — Z7982 Long term (current) use of aspirin: Secondary | ICD-10-CM | POA: Diagnosis not present

## 2015-01-01 DIAGNOSIS — L988 Other specified disorders of the skin and subcutaneous tissue: Secondary | ICD-10-CM | POA: Diagnosis present

## 2015-01-01 DIAGNOSIS — E119 Type 2 diabetes mellitus without complications: Secondary | ICD-10-CM | POA: Diagnosis not present

## 2015-01-01 DIAGNOSIS — K603 Anal fistula: Secondary | ICD-10-CM | POA: Diagnosis not present

## 2015-01-01 HISTORY — DX: Presence of spectacles and contact lenses: Z97.3

## 2015-01-01 HISTORY — DX: Hidradenitis suppurativa: L73.2

## 2015-01-01 HISTORY — DX: Sebaceous cyst: L72.3

## 2015-01-01 HISTORY — DX: Diverticulitis of large intestine without perforation or abscess without bleeding: K57.32

## 2015-01-01 HISTORY — PX: PILONIDAL CYST EXCISION: SHX744

## 2015-01-01 LAB — POCT I-STAT 4, (NA,K, GLUC, HGB,HCT)
GLUCOSE: 166 mg/dL — AB (ref 65–99)
HEMATOCRIT: 44 % (ref 39.0–52.0)
HEMOGLOBIN: 15 g/dL (ref 13.0–17.0)
Potassium: 4.9 mmol/L (ref 3.5–5.1)
Sodium: 141 mmol/L (ref 135–145)

## 2015-01-01 LAB — GLUCOSE, CAPILLARY: Glucose-Capillary: 141 mg/dL — ABNORMAL HIGH (ref 65–99)

## 2015-01-01 SURGERY — EXCISION, PILONIDAL CYST, EXTENSIVE
Anesthesia: Monitor Anesthesia Care | Site: Rectum

## 2015-01-01 MED ORDER — DEXAMETHASONE SODIUM PHOSPHATE 4 MG/ML IJ SOLN
INTRAMUSCULAR | Status: DC | PRN
  Administered 2015-01-01: 10 mg via INTRAVENOUS

## 2015-01-01 MED ORDER — SODIUM CHLORIDE 0.9 % IR SOLN
Status: DC | PRN
  Administered 2015-01-01: 500 mL

## 2015-01-01 MED ORDER — MEPERIDINE HCL 25 MG/ML IJ SOLN
6.2500 mg | INTRAMUSCULAR | Status: DC | PRN
  Filled 2015-01-01: qty 1

## 2015-01-01 MED ORDER — LIDOCAINE 5 % EX OINT
TOPICAL_OINTMENT | CUTANEOUS | Status: DC | PRN
  Administered 2015-01-01: 1

## 2015-01-01 MED ORDER — PROMETHAZINE HCL 25 MG/ML IJ SOLN
6.2500 mg | INTRAMUSCULAR | Status: DC | PRN
  Filled 2015-01-01: qty 1

## 2015-01-01 MED ORDER — FENTANYL CITRATE (PF) 100 MCG/2ML IJ SOLN
25.0000 ug | INTRAMUSCULAR | Status: DC | PRN
  Filled 2015-01-01: qty 1

## 2015-01-01 MED ORDER — ACETAMINOPHEN 10 MG/ML IV SOLN
INTRAVENOUS | Status: DC | PRN
  Administered 2015-01-01: 1000 mg via INTRAVENOUS

## 2015-01-01 MED ORDER — LACTATED RINGERS IV SOLN
INTRAVENOUS | Status: DC
  Administered 2015-01-01: 09:00:00 via INTRAVENOUS
  Filled 2015-01-01: qty 1000

## 2015-01-01 MED ORDER — SODIUM CHLORIDE 0.9 % IJ SOLN
3.0000 mL | INTRAMUSCULAR | Status: DC | PRN
  Filled 2015-01-01: qty 3

## 2015-01-01 MED ORDER — SODIUM CHLORIDE 0.9 % IJ SOLN
3.0000 mL | Freq: Two times a day (BID) | INTRAMUSCULAR | Status: DC
Start: 2015-01-01 — End: 2015-01-01
  Filled 2015-01-01: qty 3

## 2015-01-01 MED ORDER — LACTATED RINGERS IV SOLN
INTRAVENOUS | Status: DC
  Filled 2015-01-01: qty 1000

## 2015-01-01 MED ORDER — MIDAZOLAM HCL 5 MG/5ML IJ SOLN
INTRAMUSCULAR | Status: DC | PRN
  Administered 2015-01-01 (×3): 0.5 mg via INTRAVENOUS
  Administered 2015-01-01 (×2): 1 mg via INTRAVENOUS
  Administered 2015-01-01: 0.5 mg via INTRAVENOUS

## 2015-01-01 MED ORDER — ACETAMINOPHEN 325 MG PO TABS
650.0000 mg | ORAL_TABLET | ORAL | Status: DC | PRN
  Filled 2015-01-01: qty 2

## 2015-01-01 MED ORDER — KETOROLAC TROMETHAMINE 30 MG/ML IJ SOLN
INTRAMUSCULAR | Status: DC | PRN
  Administered 2015-01-01: 30 mg via INTRAVENOUS

## 2015-01-01 MED ORDER — ACETAMINOPHEN 650 MG RE SUPP
650.0000 mg | RECTAL | Status: DC | PRN
  Filled 2015-01-01: qty 1

## 2015-01-01 MED ORDER — FENTANYL CITRATE (PF) 100 MCG/2ML IJ SOLN
INTRAMUSCULAR | Status: AC
  Filled 2015-01-01: qty 4

## 2015-01-01 MED ORDER — TRAMADOL HCL 50 MG PO TABS: 50.0000 mg | ORAL_TABLET | Freq: Four times a day (QID) | ORAL | Status: DC | PRN

## 2015-01-01 MED ORDER — SODIUM CHLORIDE 0.9 % IV SOLN
250.0000 mL | INTRAVENOUS | Status: DC | PRN
  Filled 2015-01-01: qty 250

## 2015-01-01 MED ORDER — OXYCODONE HCL 5 MG PO TABS
5.0000 mg | ORAL_TABLET | ORAL | Status: DC | PRN
  Filled 2015-01-01: qty 2

## 2015-01-01 MED ORDER — ONDANSETRON HCL 4 MG/2ML IJ SOLN
INTRAMUSCULAR | Status: DC | PRN
  Administered 2015-01-01: 4 mg via INTRAVENOUS

## 2015-01-01 MED ORDER — PROPOFOL 10 MG/ML IV BOLUS
INTRAVENOUS | Status: DC | PRN
  Administered 2015-01-01: 20 mg via INTRAVENOUS
  Administered 2015-01-01 (×2): 10 mg via INTRAVENOUS

## 2015-01-01 MED ORDER — BUPIVACAINE-EPINEPHRINE 0.5% -1:200000 IJ SOLN
INTRAMUSCULAR | Status: DC | PRN
  Administered 2015-01-01: 27 mL

## 2015-01-01 MED ORDER — MIDAZOLAM HCL 2 MG/2ML IJ SOLN
INTRAMUSCULAR | Status: AC
  Filled 2015-01-01: qty 4

## 2015-01-01 MED ORDER — PROPOFOL 500 MG/50ML IV EMUL
INTRAVENOUS | Status: DC | PRN
  Administered 2015-01-01: 50 ug/kg/min via INTRAVENOUS

## 2015-01-01 MED ORDER — FENTANYL CITRATE (PF) 100 MCG/2ML IJ SOLN
INTRAMUSCULAR | Status: DC | PRN
  Administered 2015-01-01 (×16): 12.5 ug via INTRAVENOUS

## 2015-01-01 MED ORDER — LIDOCAINE HCL (CARDIAC) 20 MG/ML IV SOLN
INTRAVENOUS | Status: DC | PRN
  Administered 2015-01-01: 80 mg via INTRAVENOUS

## 2015-01-01 SURGICAL SUPPLY — 47 items
APL SKNCLS STERI-STRIP NONHPOA (GAUZE/BANDAGES/DRESSINGS) ×1
BENZOIN TINCTURE PRP APPL 2/3 (GAUZE/BANDAGES/DRESSINGS) ×3 IMPLANT
BLADE HEX COATED 2.75 (ELECTRODE) ×3 IMPLANT
BLADE SURG 15 STRL LF DISP TIS (BLADE) ×1 IMPLANT
BLADE SURG 15 STRL SS (BLADE) ×3
BRIEF STRETCH FOR OB PAD LRG (UNDERPADS AND DIAPERS) ×3 IMPLANT
CANISTER SUCTION 2500CC (MISCELLANEOUS) ×3 IMPLANT
COVER BACK TABLE 60X90IN (DRAPES) ×3 IMPLANT
COVER MAYO STAND STRL (DRAPES) ×3 IMPLANT
DRAPE LG THREE QUARTER DISP (DRAPES) ×6 IMPLANT
DRAPE PED LAPAROTOMY (DRAPES) ×3 IMPLANT
DRAPE UTILITY XL STRL (DRAPES) ×3 IMPLANT
DRSG PAD ABDOMINAL 8X10 ST (GAUZE/BANDAGES/DRESSINGS) ×3 IMPLANT
ELECT REM PT RETURN 9FT ADLT (ELECTROSURGICAL) ×3
ELECTRODE REM PT RTRN 9FT ADLT (ELECTROSURGICAL) ×1 IMPLANT
GLOVE BIO SURGEON STRL SZ 6.5 (GLOVE) ×4 IMPLANT
GLOVE BIO SURGEONS STRL SZ 6.5 (GLOVE) ×2
GLOVE INDICATOR 7.0 STRL GRN (GLOVE) ×6 IMPLANT
GOWN STRL REUS W/ TWL LRG LVL3 (GOWN DISPOSABLE) ×1 IMPLANT
GOWN STRL REUS W/ TWL XL LVL3 (GOWN DISPOSABLE) ×2 IMPLANT
GOWN STRL REUS W/TWL LRG LVL3 (GOWN DISPOSABLE) ×3
GOWN STRL REUS W/TWL XL LVL3 (GOWN DISPOSABLE) ×6
NDL HYPO 25X1 1.5 SAFETY (NEEDLE) ×1 IMPLANT
NEEDLE HYPO 25X1 1.5 SAFETY (NEEDLE) ×3 IMPLANT
NS IRRIG 500ML POUR BTL (IV SOLUTION) ×3 IMPLANT
PACK BASIN DAY SURGERY FS (CUSTOM PROCEDURE TRAY) ×3 IMPLANT
PAD ABD 8X10 STRL (GAUZE/BANDAGES/DRESSINGS) ×2 IMPLANT
PENCIL BUTTON HOLSTER BLD 10FT (ELECTRODE) ×3 IMPLANT
SPONGE GAUZE 4X4 12PLY (GAUZE/BANDAGES/DRESSINGS) ×2 IMPLANT
SPONGE GAUZE 4X4 12PLY STER LF (GAUZE/BANDAGES/DRESSINGS) ×2 IMPLANT
SUT CHROMIC 2 0 SH (SUTURE) ×2 IMPLANT
SUT CHROMIC 3 0 SH 27 (SUTURE) IMPLANT
SUT ETHILON 3 0 PS 1 (SUTURE) IMPLANT
SUT MON AB 3-0 SH 27 (SUTURE)
SUT MON AB 3-0 SH27 (SUTURE) IMPLANT
SUT VIC AB 2-0 SH 18 (SUTURE) ×3 IMPLANT
SUT VIC AB 3-0 SH 27 (SUTURE) ×3
SUT VIC AB 3-0 SH 27X BRD (SUTURE) ×1 IMPLANT
SUT VIC AB 4-0 P-3 18XBRD (SUTURE) IMPLANT
SUT VIC AB 4-0 P3 18 (SUTURE)
SYR BULB IRRIGATION 50ML (SYRINGE) ×3 IMPLANT
SYR CONTROL 10ML LL (SYRINGE) ×3 IMPLANT
TOWEL OR 17X24 6PK STRL BLUE (TOWEL DISPOSABLE) ×6 IMPLANT
TRAY DSU PREP LF (CUSTOM PROCEDURE TRAY) ×3 IMPLANT
TUBE CONNECTING 12'X1/4 (SUCTIONS) ×1
TUBE CONNECTING 12X1/4 (SUCTIONS) ×2 IMPLANT
YANKAUER SUCT BULB TIP NO VENT (SUCTIONS) ×3 IMPLANT

## 2015-01-01 NOTE — Discharge Instructions (Addendum)
Beginning the day after surgery:  Keep surgical area clean and covered.  Change dressing daily and as needed.  Expect drainage from your wound.  Walking is encouraged.  Avoid strenuous activity and heavy lifting for one month after surgery.  Sit on a soft pillow gently.  Call the office if you have any questions or concerns.  Call immediately if you develop:   Severe pain around incision  Increased discomfort  Fever greater than 100 F  Difficulty urinating   Post Anesthesia Home Care Instructions  Activity: Get plenty of rest for the remainder of the day. A responsible adult should stay with you for 24 hours following the procedure.  For the next 24 hours, DO NOT: -Drive a car -Advertising copywriter -Drink alcoholic beverages -Take any medication unless instructed by your physician -Make any legal decisions or sign important papers.  Meals: Start with liquid foods such as gelatin or soup. Progress to regular foods as tolerated. Avoid greasy, spicy, heavy foods. If nausea and/or vomiting occur, drink only clear liquids until the nausea and/or vomiting subsides. Call your physician if vomiting continues.  Special Instructions/Symptoms: Your throat may feel dry or sore from the anesthesia or the breathing tube placed in your throat during surgery. If this causes discomfort, gargle with warm salt water. The discomfort should disappear within 24 hours.  If you had a scopolamine patch placed behind your ear for the management of post- operative nausea and/or vomiting:  1. The medication in the patch is effective for 72 hours, after which it should be removed.  Wrap patch in a tissue and discard in the trash. Wash hands thoroughly with soap and water. 2. You may remove the patch earlier than 72 hours if you experience unpleasant side effects which may include dry mouth, dizziness or visual disturbances. 3. Avoid touching the patch. Wash your hands with soap and water after contact with  the patch.

## 2015-01-01 NOTE — Anesthesia Preprocedure Evaluation (Addendum)
Anesthesia Evaluation  Patient identified by MRN, date of birth, ID band Patient awake    Reviewed: Allergy & Precautions, NPO status , Patient's Chart, lab work & pertinent test results  Airway Mallampati: II  TM Distance: >3 FB Neck ROM: Full    Dental no notable dental hx.    Pulmonary Current Smoker,  breath sounds clear to auscultation  Pulmonary exam normal       Cardiovascular Normal cardiovascular exam+ dysrhythmias Atrial Fibrillation Rhythm:Regular Rate:Normal     Neuro/Psych negative neurological ROS  negative psych ROS   GI/Hepatic negative GI ROS, Neg liver ROS,   Endo/Other  diabetes, Type 2, Oral Hypoglycemic Agents  Renal/GU negative Renal ROS  negative genitourinary   Musculoskeletal negative musculoskeletal ROS (+)   Abdominal   Peds negative pediatric ROS (+)  Hematology negative hematology ROS (+)   Anesthesia Other Findings   Reproductive/Obstetrics negative OB ROS                           Anesthesia Physical Anesthesia Plan  ASA: III  Anesthesia Plan: MAC   Post-op Pain Management:    Induction:   Airway Management Planned: Simple Face Mask  Additional Equipment:   Intra-op Plan:   Post-operative Plan:   Informed Consent: I have reviewed the patients History and Physical, chart, labs and discussed the procedure including the risks, benefits and alternatives for the proposed anesthesia with the patient or authorized representative who has indicated his/her understanding and acceptance.   Dental advisory given  Plan Discussed with: CRNA  Anesthesia Plan Comments:        Anesthesia Quick Evaluation

## 2015-01-01 NOTE — Interval H&P Note (Signed)
History and Physical Interval Note:  01/01/2015 9:06 AM  Leroy Chen  has presented today for surgery, with the diagnosis of pilonidal disease  The various methods of treatment have been discussed with the patient and family. After consideration of risks, benefits and other options for treatment, the patient has consented to  Procedure(s): EXCISION PILONIDAL DISEASE, POSSIBLE FISTULOTOMY VERSE SECTON (N/A) as a surgical intervention .  The patient's history has been reviewed, patient examined, no change in status, stable for surgery.  I have reviewed the patient's chart and labs.  Questions were answered to the patient's satisfaction.     Vanita Panda, MD  Colorectal and General Surgery [facility]

## 2015-01-01 NOTE — Op Note (Signed)
01/01/2015  12:45 PM  PATIENT:  Leroy Chen  59 y.o. male  Patient Care Team: Kirby Funk, MD as PCP - General (Internal Medicine)  PRE-OPERATIVE DIAGNOSIS:  pilonidal disease  POST-OPERATIVE DIAGNOSIS:  pilonidal disease  PROCEDURE:  EXCISION OF PILONIDAL DISEASE  Surgeon(s): Romie Levee, MD  ASSISTANT: none   ANESTHESIA:   local and MAC  SPECIMEN:  Source of Specimen:  sebacueous cyst  DISPOSITION OF SPECIMEN:  PATHOLOGY  COUNTS:  YES  PLAN OF CARE: Discharge to home after PACU  PATIENT DISPOSITION:  PACU - hemodynamically stable.  INDICATION: 59 y.o. M with chronic posterior sacral skin infection   OR FINDINGS: fistula tract posterior midline.  L perianal sebaceous cyst  DESCRIPTION: the patient was identified in the preoperative holding area and taken to the OR where they were laid on the operating room table.  MAC anesthesia was induced without difficulty. The patient was then positioned in prone jackknife position with buttocks gently taped apart.  The patient was then prepped and draped in usual sterile fashion.  SCDs were noted to be in place prior to the initiation of anesthesia. A surgical timeout was performed indicating the correct patient, procedure, positioning and need for preoperative antibiotics.  A field block was performed using Marcaine with epinephrine.     I used a fistula probe to identify the areas of concern.  There was a or palpable nodule in the left posterior perianal region.This was draining sebaceous material. I made an elliptical incision around this and it remove the entire cyst and cyst wall. This was sent to pathology for further examination.The subcutaneous tissue was mobilized and hemostasis was achieved using electrocautery.The subcutaneous tissue was closed using interrupted 2-0 Vicryl sutures.The dermal layer was reapproximated with interrupted 2-0 Vicryl sutures.An terminated attention to the other area that was draining purulence.  This was a posterior midline wound. I placed a fistula probe into this wound in the posterior sacral space and  This exited the skin and a second fistulous opening approximately 2 cm towards the anal canal.  I performed a fistulotomy. There was no other communication with any cavities.  The fistulotomy was marsupialized using a 2-0 chromic suture. Hemostasis was good at the end of the case. A sterile dressing was applied. The patient was awakened from anesthesia and sent to the postanesthesia care unit in stable condition.

## 2015-01-01 NOTE — H&P (View-Only) (Signed)
Leroy BalAnthony D. Mcnamee 12/16/2014 1:35 PM Location: [facility] Patient #: [id number] DOB: 10/11/1955 Married / Language: English / Race: Black or African American Male History of Present Illness Leroy Chen(Hildur Bayer MD; 12/16/2014 1:58 PM) Patient words: perirectal fistula.  The patient is a 59 year old male who presents with a complaint of fistula. Patient presents to the office with continued drainage from a perirectal abscess site that I&D was performed approximately one month previously. He continues to have. Drainage on daily basis. Of note the patient is diverted currently due to an episode of diverticulitis. This surgery was performed on 10/15/2014. Patient has a history of pilonidal cyst in the past. He denies any history of rectal surgery. Problem List/Past Medical Leroy Chen(Effa Yarrow, MD; 12/16/2014 1:58 PM) PILONIDAL DISEASE (709.8  L98.8) We will call you with a date for surgery. DIABETES MELLITUS TYPE 2, CONTROLLED, WITHOUT COMPLICATIONS (250.00  E11.9) DIVERTICULITIS OF COLON WITH PERFORATION (562.11  K57.20) ATRIAL FIBRILLATION WITH RVR (427.31  I48.91) TOBACCO ABUSE (305.1  Z72.0) HYPERLIPIDEMIA, ACQUIRED (272.4  E78.5)  Other Problems Leroy Chen(Vaida Kerchner, MD; 12/16/2014 1:58 PM) Atrial Fibrillation Diabetes Mellitus Diverticulosis High blood pressure  Past Surgical History Michel Bickers(Kelly Dockery, LPN; 0/4/54096/07/2014 8:111:36 PM) Resection of Small Bowel  Diagnostic Studies History Michel Bickers(Kelly Dockery, LPN; 9/1/47826/07/2014 9:561:36 PM) Colonoscopy 1-5 years ago  Allergies Michel Bickers(Kelly Dockery, LPN; 2/1/30866/07/2014 5:781:37 PM) Penicillins  Medication History Leroy Chen(Taegan Standage, MD; 12/16/2014 1:58 PM) Aspirin Childrens (81MG  Tablet Chewable, Oral) Active. Amaryl (4MG  Tablet, Oral) Active. Glucophage (500MG  Tablet, Oral) Active. Multivitamin Adults 50+ (Oral) Active. Medications Reconciled Cipro (500MG  Tablet, 1 (one) Tablet Oral two times daily, Taken starting 12/16/2014) Active. Flagyl (500MG  Tablet, 1  (one) Tablet Oral bid, Taken starting 12/16/2014) Active. Lopressor (50MG  Tablet, Oral) Active.  Social History Leroy Chen(Dhillon Comunale, MD; 12/16/2014 1:58 PM) Alcohol use Occasional alcohol use. Caffeine use Carbonated beverages, Coffee. Illicit drug use Remotely quit drug use. Tobacco use Current every day smoker, Current some day smoker. No drug use  Family History Leroy Chen(Aron Needles, MD; 12/16/2014 1:58 PM) Arthritis Father. Diabetes Mellitus Brother, Father, Mother, Sister. Heart Disease Brother, Mother. Hypertension Father. Heart disease in male family member before age 265     Review of Systems Michel Bickers(Kelly Dockery LPN; 4/6/96296/07/2014 5:281:36 PM) General Not Present- Appetite Loss, Chills, Fatigue, Fever, Night Sweats, Weight Gain and Weight Loss. Skin Present- New Lesions. Not Present- Change in Wart/Mole, Dryness, Hives, Jaundice, Non-Healing Wounds, Rash and Ulcer. HEENT Present- Ringing in the Ears and Wears glasses/contact lenses. Not Present- Earache, Hearing Loss, Hoarseness, Nose Bleed, Oral Ulcers, Seasonal Allergies, Sinus Pain, Sore Throat, Visual Disturbances and Yellow Eyes. Respiratory Present- Snoring. Not Present- Bloody sputum, Chronic Cough, Difficulty Breathing and Wheezing. Breast Not Present- Breast Mass, Breast Pain, Nipple Discharge and Skin Changes. Cardiovascular Not Present- Chest Pain, Difficulty Breathing Lying Down, Leg Cramps, Palpitations, Rapid Heart Rate, Shortness of Breath and Swelling of Extremities. Gastrointestinal Present- Rectal Pain. Not Present- Abdominal Pain, Bloating, Bloody Stool, Change in Bowel Habits, Chronic diarrhea, Constipation, Difficulty Swallowing, Excessive gas, Gets full quickly at meals, Hemorrhoids, Indigestion, Nausea and Vomiting. Male Genitourinary Present- Impotence. Not Present- Blood in Urine, Change in Urinary Stream, Frequency, Nocturia, Painful Urination, Urgency and Urine Leakage. Musculoskeletal Present- Joint Stiffness. Not  Present- Back Pain, Joint Pain, Muscle Pain, Muscle Weakness and Swelling of Extremities. Neurological Not Present- Decreased Memory, Fainting, Headaches, Numbness, Seizures, Tingling, Tremor, Trouble walking and Weakness. Psychiatric Not Present- Anxiety, Bipolar, Change in Sleep Pattern, Depression, Fearful and Frequent crying. Endocrine Not Present- Cold Intolerance, Excessive Hunger,  Hair Changes, Heat Intolerance, Hot flashes and New Diabetes. Hematology Present- Persistent Infections. Not Present- Easy Bruising, Excessive bleeding, Gland problems and HIV.  Vitals Tresa Endo Dockery LPN; 5/0/3546 5:68 PM) 12/16/2014 1:36 PM Weight: 238.6 lb Height: 73in Body Surface Area: 2.36 m Body Mass Index: 31.48 kg/m Temp.: 99.47F(Oral)  Pulse: 93 (Regular)  BP: 126/84 (Sitting, Left Arm, Standard)     Physical Exam Leroy Levee MD; 12/16/2014 2:00 PM)  General Mental Status-Alert. General Appearance-Consistent with stated age. Hydration-Well hydrated. Voice-Normal.  Head and Neck Head-normocephalic, atraumatic with no lesions or palpable masses. Trachea-midline. Thyroid Gland Characteristics - normal size and consistency.  Eye Eyeball - Bilateral-Extraocular movements intact. Sclera/Conjunctiva - Bilateral-No scleral icterus.  Chest and Lung Exam Chest and lung exam reveals -quiet, even and easy respiratory effort with no use of accessory muscles and on auscultation, normal breath sounds, no adventitious sounds and normal vocal resonance. Inspection Chest Wall - Normal. Back - normal.  Cardiovascular Cardiovascular examination reveals -normal heart sounds, regular rate and rhythm with no murmurs and normal pedal pulses bilaterally.  Abdomen Inspection Inspection of the abdomen reveals - No Hernias. Palpation/Percussion Palpation and Percussion of the abdomen reveal - Soft, Non Tender, No Rebound tenderness, No Rigidity (guarding) and No  hepatosplenomegaly. Auscultation Auscultation of the abdomen reveals - Bowel sounds normal.  Rectal Anorectal Exam External - Note: Patient with purulent drainage from the sacral region. Most consistent with pilonidal disease. Minimal perirectal inflammation noted posteriorly.  Neurologic Neurologic evaluation reveals -alert and oriented x 3 with no impairment of recent or remote memory. Mental Status-Normal.  Musculoskeletal Global Assessment -Note:no gross deformities.  Normal Exam - Left-Upper Extremity Strength Normal and Lower Extremity Strength Normal. Normal Exam - Right-Upper Extremity Strength Normal and Lower Extremity Strength Normal.    Assessment & Plan Leroy Levee MD; 12/16/2014 1:58 PM)  PILONIDAL DISEASE (709.8  L98.8) Story: We will call you with a date for surgery. Impression: The 60 year old male who is currently diverted due to diverticulitis who presents to the office with a chronically draining abscess. On exam this seems to be related to his pilonidal disease. I have recommended an exam under anesthesia with possible excision of pilonidal disease versus fistulotomy or seton placement. We discussed the risk of this which are bleeding, infection, damage to adjacent structures and recurrence. I believe he understands these risks and has agreed to proceed with surgery.

## 2015-01-01 NOTE — Transfer of Care (Signed)
Immediate Anesthesia Transfer of Care Note  Patient: Leroy Chen  Procedure(s) Performed: Procedure(s) (LRB): EXCISION OF PILONIDAL DISEASE (N/A)  Patient Location: PACU  Anesthesia Type: MAC  Level of Consciousness: awake, sedated, patient cooperative and responds to stimulation  Airway & Oxygen Therapy: Patient Spontanous Breathing and Patient connected to face mask oxygen  Post-op Assessment: Report given to PACU RN, Post -op Vital signs reviewed and stable and Patient moving all extremities  Post vital signs: Reviewed and stable  Complications: No apparent anesthesia complications

## 2015-01-01 NOTE — Anesthesia Postprocedure Evaluation (Signed)
  Anesthesia Post-op Note  Patient: Leroy Chen  Procedure(s) Performed: Procedure(s) (LRB): EXCISION OF PILONIDAL DISEASE (N/A)  Patient Location: PACU  Anesthesia Type: MAC  Level of Consciousness: awake and alert   Airway and Oxygen Therapy: Patient Spontanous Breathing  Post-op Pain: mild  Post-op Assessment: Post-op Vital signs reviewed, Patient's Cardiovascular Status Stable, Respiratory Function Stable, Patent Airway and No signs of Nausea or vomiting  Last Vitals:  Filed Vitals:   01/01/15 1030  BP: 111/65  Pulse: 81  Temp:   Resp: 20    Post-op Vital Signs: stable   Complications: No apparent anesthesia complications

## 2015-01-01 NOTE — Anesthesia Procedure Notes (Signed)
Procedure Name: MAC Date/Time: 01/01/2015 9:28 AM Performed by: Jessica Priest Pre-anesthesia Checklist: Patient identified, Timeout performed, Emergency Drugs available, Suction available and Patient being monitored Patient Re-evaluated:Patient Re-evaluated prior to inductionOxygen Delivery Method: Simple face mask Preoxygenation: Pre-oxygenation with 100% oxygen Intubation Type: IV induction Placement Confirmation: positive ETCO2 and breath sounds checked- equal and bilateral

## 2015-01-04 ENCOUNTER — Encounter (HOSPITAL_BASED_OUTPATIENT_CLINIC_OR_DEPARTMENT_OTHER): Payer: Self-pay | Admitting: General Surgery

## 2015-02-10 ENCOUNTER — Other Ambulatory Visit: Payer: Self-pay | Admitting: General Surgery

## 2015-02-10 DIAGNOSIS — Z433 Encounter for attention to colostomy: Secondary | ICD-10-CM

## 2015-02-11 ENCOUNTER — Other Ambulatory Visit: Payer: Self-pay | Admitting: General Surgery

## 2015-02-11 DIAGNOSIS — Z433 Encounter for attention to colostomy: Secondary | ICD-10-CM

## 2015-02-15 ENCOUNTER — Ambulatory Visit (HOSPITAL_COMMUNITY)
Admission: RE | Admit: 2015-02-15 | Discharge: 2015-02-15 | Disposition: A | Discharge status: Home / Self Care | Payer: 59 | Source: Ambulatory Visit | Attending: General Surgery | Admitting: General Surgery

## 2015-02-15 DIAGNOSIS — Z433 Encounter for attention to colostomy: Secondary | ICD-10-CM

## 2015-02-15 DIAGNOSIS — Z85038 Personal history of other malignant neoplasm of large intestine: Secondary | ICD-10-CM | POA: Diagnosis present

## 2015-03-18 ENCOUNTER — Other Ambulatory Visit: Payer: Self-pay | Admitting: General Surgery

## 2015-04-20 ENCOUNTER — Encounter (HOSPITAL_COMMUNITY): Payer: Self-pay

## 2015-04-20 ENCOUNTER — Encounter (HOSPITAL_COMMUNITY)
Admission: RE | Admit: 2015-04-20 | Discharge: 2015-04-20 | Disposition: A | Discharge status: Home / Self Care | Payer: 59 | Source: Ambulatory Visit | Attending: General Surgery | Admitting: General Surgery

## 2015-04-20 DIAGNOSIS — K5732 Diverticulitis of large intestine without perforation or abscess without bleeding: Secondary | ICD-10-CM | POA: Insufficient documentation

## 2015-04-20 DIAGNOSIS — Z01818 Encounter for other preprocedural examination: Secondary | ICD-10-CM | POA: Diagnosis not present

## 2015-04-20 DIAGNOSIS — Z933 Colostomy status: Secondary | ICD-10-CM | POA: Insufficient documentation

## 2015-04-20 LAB — CBC WITH DIFFERENTIAL/PLATELET
BASOS PCT: 0 %
Basophils Absolute: 0 10*3/uL (ref 0.0–0.1)
EOS ABS: 0.3 10*3/uL (ref 0.0–0.7)
EOS PCT: 3 %
HCT: 41.9 % (ref 39.0–52.0)
Hemoglobin: 13.8 g/dL (ref 13.0–17.0)
LYMPHS ABS: 3 10*3/uL (ref 0.7–4.0)
Lymphocytes Relative: 36 %
MCH: 30.9 pg (ref 26.0–34.0)
MCHC: 32.9 g/dL (ref 30.0–36.0)
MCV: 93.7 fL (ref 78.0–100.0)
Monocytes Absolute: 0.6 10*3/uL (ref 0.1–1.0)
Monocytes Relative: 7 %
NEUTROS PCT: 54 %
Neutro Abs: 4.5 10*3/uL (ref 1.7–7.7)
PLATELETS: 236 10*3/uL (ref 150–400)
RBC: 4.47 MIL/uL (ref 4.22–5.81)
RDW: 12.5 % (ref 11.5–15.5)
WBC: 8.4 10*3/uL (ref 4.0–10.5)

## 2015-04-20 LAB — URINALYSIS, ROUTINE W REFLEX MICROSCOPIC
Bilirubin Urine: NEGATIVE
Glucose, UA: >1000 mg/dL — AB
Hgb urine dipstick: NEGATIVE
Ketones, ur: NEGATIVE mg/dL
Leukocytes, UA: NEGATIVE
NITRITE: NEGATIVE
Protein, ur: 100 mg/dL — AB
SPECIFIC GRAVITY, URINE: 1.027 (ref 1.005–1.030)
UROBILINOGEN UA: 0.2 mg/dL (ref 0.0–1.0)
pH: 5.5 (ref 5.0–8.0)

## 2015-04-20 LAB — SURGICAL PCR SCREEN
MRSA, PCR: NEGATIVE
Staphylococcus aureus: POSITIVE — AB

## 2015-04-20 LAB — COMPREHENSIVE METABOLIC PANEL
ALBUMIN: 3.8 g/dL (ref 3.5–5.0)
ALT: 21 U/L (ref 17–63)
ANION GAP: 8 (ref 5–15)
AST: 21 U/L (ref 15–41)
Alkaline Phosphatase: 55 U/L (ref 38–126)
BUN: 16 mg/dL (ref 6–20)
CHLORIDE: 106 mmol/L (ref 101–111)
CO2: 25 mmol/L (ref 22–32)
Calcium: 9.6 mg/dL (ref 8.9–10.3)
Creatinine, Ser: 1.19 mg/dL (ref 0.61–1.24)
GFR calc non Af Amer: >60 mL/min (ref >60)
GLUCOSE: 232 mg/dL — AB (ref 65–99)
Potassium: 4.5 mmol/L (ref 3.5–5.1)
SODIUM: 139 mmol/L (ref 135–145)
Total Bilirubin: 0.6 mg/dL (ref 0.3–1.2)
Total Protein: 7.1 g/dL (ref 6.5–8.1)

## 2015-04-20 LAB — URINE MICROSCOPIC-ADD ON

## 2015-04-20 LAB — GLUCOSE, CAPILLARY: Glucose-Capillary: 246 mg/dL — ABNORMAL HIGH (ref 65–99)

## 2015-04-20 NOTE — Pre-Procedure Instructions (Addendum)
Leroy Chen  04/20/2015      [id number] [place], [place] [id number] [place] [address] Phone: [phone] Fax: [phone]    Your procedure is scheduled on  04/27/2015  Report to [facility] Admitting at 5:30 A.M.  Call this number if you have problems the morning of surgery:  [phone]                NO MORE ASPIRIN AFTER 04/22/2015   Remember:  Do not eat food or drink liquids after midnight.  On Monday     KEEP IN MIND THE BOWEL/ ENEMA PREP INSTRUCTIONS   Take these medicines the morning of surgery with A SIP OF WATER : medicine instructions given by Dr. Derrell Lolling   Do not wear jewelry   Do not wear lotions, powders, or perfumes.              Men may shave face and neck.   Do not bring valuables to the hospital.   [facility] is not responsible for any belongings or valuables.  Contacts, dentures or bridgework may not be worn into surgery.  Leave your suitcase in the car.  After surgery it may be brought to your room.  For patients admitted to the hospital, discharge time will be determined by your treatment team.  Patients discharged the day of surgery will not be allowed to drive home.   Name and phone number of your driver:   With family Special instructions:  How to Manage Your Diabetes Before Surgery   Why is it important to control my blood sugar before and after surgery?   Improving blood sugar levels before and after surgery helps healing and can limit problems.  A way of improving blood sugar control is eating a healthy diet by:  - Eating less sugar and carbohydrates  - Increasing activity/exercise  - Talk with your doctor about reaching your blood sugar goals  High blood sugars (greater than 180 mg/dL) can raise your risk of infections and slow down your recovery so you will need to focus on controlling your diabetes during the weeks before surgery.  Make sure that the doctor who takes care of your  diabetes knows about your planned surgery including the date and location.  How do I manage my blood sugars before surgery?   Check your blood sugar at least 4 times a day, 2 days before surgery to make sure that they are not too high or low.   Check your blood sugar the morning of your surgery when you wake up and every 2               hours until you get to the Short-Stay unit.  If your blood sugar is less than 70 mg/dL, you will need to treat for low blood sugar by:  Treat a low blood sugar (less than 70 mg/dL) with 1/2 cup of clear juice (cranberry or apple), 4 glucose tablets, OR glucose gel.  Recheck blood sugar in 15 minutes after treatment (to make sure it is greater than 70 mg/dL).  If blood sugar is not greater than 70 mg/dL on re-check, call [phone] for further instructions.   Report your blood sugar to the Short-Stay nurse when you get to Short-Stay.  References:  [facility], 2007 "How to Manage your Diabetes Before and After Surgery".  What do I do about my diabetes medications?   Do not take  oral diabetes medicines (pills) the morning of surgery.         I              Special Instructions: [place] - Preparing for Surgery  Before surgery, you can play an important role.  Because skin is not sterile, your skin needs to be as free of germs as possible.  You can reduce the number of germs on you skin by washing with CHG (chlorahexidine gluconate) soap before surgery.  CHG is an antiseptic cleaner which kills germs and bonds with the skin to continue killing germs even after washing.  Please DO NOT use if you have an allergy to CHG or antibacterial soaps.  If your skin becomes reddened/irritated stop using the CHG and inform your nurse when you arrive at Short Stay.  Do not shave (including legs and underarms) for at least 48 hours prior to the first CHG shower.  You may shave your face.  Please follow these  instructions carefully:   1.  Shower with CHG Soap the night before surgery and the  morning of Surgery.  2.  If you choose to wash your hair, wash your hair first as usual with your  normal shampoo.  3.  After you shampoo, rinse your hair and body thoroughly to remove the  Shampoo.  4.  Use CHG as you would any other liquid soap.  You can apply chg directly to the skin and wash gently with scrungie or a clean washcloth.  5.  Apply the CHG Soap to your body ONLY FROM THE NECK DOWN.    Do not use on open wounds or open sores.  Avoid contact with your eyes, ears, mouth and genitals (private parts).  Wash genitals (private parts)   with your normal soap.  6.  Wash thoroughly, paying special attention to the area where your surgery will be performed.  7.  Thoroughly rinse your body with warm water from the neck down.  8.  DO NOT shower/wash with your normal soap after using and rinsing off   the CHG Soap.  9.  Pat yourself dry with a clean towel.            10.  Wear clean pajamas.            11.  Place clean sheets on your bed the night of your first shower and do not sleep with pets.  Day of Surgery  Do not apply any lotions/deodorants the morning of surgery.  Please wear clean clothes to the hospital/surgery center.  Please read over the following fact sheets that you were given. Pain Booklet, Coughing and Deep Breathing, MRSA Information and Surgical Site Infection Prevention

## 2015-04-20 NOTE — Progress Notes (Signed)
I called a prescription [facility] [address], [place], [place]. 

## 2015-04-20 NOTE — Progress Notes (Signed)
Pt. Denies any cardiac consult ever being done, is followed by Roseanne Reno for PCP.

## 2015-04-21 LAB — HEMOGLOBIN A1C
HEMOGLOBIN A1C: 7.9 % — AB (ref 4.8–5.6)
Mean Plasma Glucose: 180 mg/dL

## 2015-04-21 NOTE — Progress Notes (Signed)
Pt notified of positive PCR of staph and need to pick up Mupirocin Rx. Pt voiced understanding.

## 2015-04-26 NOTE — H&P (Signed)
Leroy Chen  Location: [facility] Patient #: [id number] DOB: 10-16-1955 Married / Language: English / Race: Black or African American Male       History of Present Illness   The patient is a 59 year old male who presents with diverticulitis. This gentleman returns to discuss colostomy closure. He is very much in favor of proceeding with that surgery. In March 2016 he underwent laparotomy, sigmoid colectomy and colostomy and drainage of a retroperitoneal abscess following failed medical management with percutaneous drain. I stapled off the rectum at least 6 cm  above the peritoneal reflection. He is basically  recovered. He is followed by Dr. Kirby Funk. Fortunately, he states that he is stop smoking. I congratulated him. His BMI is still 31 but he's lost a few pounds. He does not have any abdominal pain. His midline wound is completely healed. Barium enema shows that he seems to have enough length both above and below and no other pathologic changes. Last colonoscopy March 2013 by Dr. Danise Edge showing diverticulosis otherwise normal. Comorbidities include the tobacco abuse, now has stopped, type 2 diabetes non-insulin-dependent, hyperlipidemia, history of atrial fibrillation with RVR, history of hypertension but has been taken off of Lopressor. He will be scheduled for laparoscopic-assisted takedown of splenic flexure, laparoscopic assisted resection and closure of colostomy. I described techniques to him. I described the staplers. He understands the indications, details, techniques and numerous risk of the surgery. He is aware of the risk of bleeding, infection, wound healing problems, hernia, injury to adjacent organs with major reconstructive surgery, anastomotic leak with reoperation for sepsis, cardiac, pulmonary and thromboembolic problem. He understands all these issues. All of his questions are answered. He is very motivated to have this done  and wants to proceed.    Allergies  Penicillins  Medication History  Flagyl (  Tablet, 1 (one) Tablet MG Oral bid, Taken starting 12/16/2014) Active. Multivitamins (Oral) Active. Dulcolax (  Tablet DR, Oral) Active. MiraLax (Oral) Active. Ensure Active (Oral) Active. Multivitamin Adults 50+ (Oral) Active. Aspirin Childrens (  Tablet Chewable, Oral) Active. Amaryl (  Tablet, Oral) Active. Glucophage (  Tablet, Oral) Active. Medications Reconciled  Vitals   Weight: 248 lb Height: 74in Body Surface Area: 2.42 m Body Mass Index: 31.84 kg/m Temp.: 82F(Temporal)  Pulse: 77 (Regular)  BP: 128/76 (Sitting, Left Arm, Standard)    Physical Exam  General Mental Status-Alert. General Appearance-Not in acute distress. Build & Nutrition-Well nourished. Posture-Normal posture. Gait-Normal.  Head and Neck Head-normocephalic, atraumatic with no lesions or palpable masses. Trachea-midline. Thyroid Gland Characteristics - normal size and consistency and no palpable nodules.  Chest and Lung Exam Chest and lung exam reveals -on auscultation, normal breath sounds, no adventitious sounds and normal vocal resonance.  Cardiovascular Cardiovascular examination reveals -normal heart sounds, regular rate and rhythm with no murmurs and femoral artery auscultation bilaterally reveals normal pulses, no bruits, no thrills.  Abdomen Note: Abdomen is soft and nontender. Multiple lower midline wound well-healed. No hernia. Healthy stoma on left side.   Neurologic Neurologic evaluation reveals -alert and oriented x 3 with no impairment of recent or remote memory, normal attention span and ability to concentrate, normal sensation and normal coordination.  Musculoskeletal Normal Exam - Bilateral-Upper Extremity Strength Normal and Lower Extremity Strength Normal.    Assessment & Plan  DIVERTICULITIS OF COLON WITH PERFORATION  (562.11  K57.20)   You are being scheduled for surgery - Our schedulers will call you.  Your barium enema looks good. You seemed to have enough  colon to put the two ends back together. Continue to take tap water enemas per rectum, 2 enemas twice a week Congratulations on stopping smoking. Do not restart. Try to lose weight. you will be given instructions on your bowel prep. you will take laxatives which will clean out the upper colon. Take several enemas per rectum for 48 hours prior to surgery We have also called in 2 prescriptions for antibiotics to your pharmacy. Take these as directed the day before surgery We have discussed the indications, techniques, and numerous risk of the surgery in detail Please read the printed information that we gave you    You should hear from our office's scheduling department within 5 working days about the location, date, and time of surgery. We try to make accommodations for patient's preferences in scheduling surgery, but sometimes the OR schedule or the surgeon's schedule prevents [place] from making those accommodations.  If you have not heard from our office [phone]) in 5 working days, call the office and ask for your surgeon's nurse.  If you have other questions about your diagnosis, plan, or surgery, call the office and ask for your surgeon's nurse. Pt Education - [facility] Colon Bowel Prep 2015 Miralax/Antibiotics  Started Neomycin Sulfate , 2 (two) Tablet SEE NOTE, #6, 03/18/2015, No Refill. Local Order: TAKE TWO TABLETS AT 2 PM, 3 PM, AND 10 PM THE DAY PRIOR TO SURGERY Started Flagyl , 2 (two) Tablet three times daily, #6, 1 day starting 03/18/2015, No Refill. Local Order: Take two tablets at 2 PM, 2 tablets at 3 PM, and 2 tablets at 10 PM the day before surgery Pt Education - Pamphlet Given - Laparoscopic Colorectal Surgery: discussed with patient and provided information.   COLOSTOMY IN PLACE (V44.3  [id number]) TOBACCO ABUSE (305.1   Z72.0) BMI 32.0-32.9,ADULT (V85.32  Z68.32) ATRIAL FIBRILLATION WITH RVR (427.31  I48.91) HYPERLIPIDEMIA, ACQUIRED (272.4  E78.5) DIABETES MELLITUS TYPE 2, CONTROLLED, WITHOUT COMPLICATIONS (250.00  E11.9)   Omair Dettmer M. Derrell Lolling, M.D., [facility] General and Minimally invasive Surgery Breast and Colorectal Surgery Office:   [phone] Pager:   [phone]

## 2015-04-27 ENCOUNTER — Inpatient Hospital Stay (HOSPITAL_COMMUNITY): Payer: 59 | Admitting: Anesthesiology

## 2015-04-27 ENCOUNTER — Encounter (HOSPITAL_COMMUNITY)
Admission: RE | Disposition: A | Discharge status: Home / Self Care | Payer: Self-pay | Source: Ambulatory Visit | Attending: General Surgery

## 2015-04-27 ENCOUNTER — Inpatient Hospital Stay (HOSPITAL_COMMUNITY)
Admission: RE | Admit: 2015-04-27 | Discharge: 2015-05-03 | DRG: 336 | Disposition: A | Discharge status: Home / Self Care | Payer: 59 | Source: Ambulatory Visit | Attending: General Surgery | Admitting: General Surgery

## 2015-04-27 ENCOUNTER — Encounter (HOSPITAL_COMMUNITY): Payer: Self-pay | Admitting: Anesthesiology

## 2015-04-27 DIAGNOSIS — Z23 Encounter for immunization: Secondary | ICD-10-CM | POA: Diagnosis not present

## 2015-04-27 DIAGNOSIS — K5732 Diverticulitis of large intestine without perforation or abscess without bleeding: Secondary | ICD-10-CM | POA: Diagnosis present

## 2015-04-27 DIAGNOSIS — I4891 Unspecified atrial fibrillation: Secondary | ICD-10-CM | POA: Diagnosis present

## 2015-04-27 DIAGNOSIS — E785 Hyperlipidemia, unspecified: Secondary | ICD-10-CM | POA: Diagnosis present

## 2015-04-27 DIAGNOSIS — K5780 Diverticulitis of intestine, part unspecified, with perforation and abscess without bleeding: Secondary | ICD-10-CM | POA: Diagnosis present

## 2015-04-27 DIAGNOSIS — K567 Ileus, unspecified: Secondary | ICD-10-CM | POA: Diagnosis not present

## 2015-04-27 DIAGNOSIS — K660 Peritoneal adhesions (postprocedural) (postinfection): Secondary | ICD-10-CM | POA: Diagnosis present

## 2015-04-27 DIAGNOSIS — E119 Type 2 diabetes mellitus without complications: Secondary | ICD-10-CM | POA: Diagnosis present

## 2015-04-27 DIAGNOSIS — K219 Gastro-esophageal reflux disease without esophagitis: Secondary | ICD-10-CM | POA: Diagnosis present

## 2015-04-27 DIAGNOSIS — Z433 Encounter for attention to colostomy: Secondary | ICD-10-CM | POA: Diagnosis present

## 2015-04-27 DIAGNOSIS — Z87891 Personal history of nicotine dependence: Secondary | ICD-10-CM | POA: Diagnosis not present

## 2015-04-27 HISTORY — PX: ILEO LOOP COLOSTOMY CLOSURE: SHX5257

## 2015-04-27 LAB — GLUCOSE, CAPILLARY
GLUCOSE-CAPILLARY: 165 mg/dL — AB (ref 65–99)
GLUCOSE-CAPILLARY: 202 mg/dL — AB (ref 65–99)
Glucose-Capillary: 230 mg/dL — ABNORMAL HIGH (ref 65–99)
Glucose-Capillary: 210 mg/dL — ABNORMAL HIGH (ref 65–99)
Glucose-Capillary: 133 mg/dL — ABNORMAL HIGH (ref 65–99)

## 2015-04-27 LAB — CBC
HEMATOCRIT: 42.5 % (ref 39.0–52.0)
HEMOGLOBIN: 13.8 g/dL (ref 13.0–17.0)
MCH: 30.3 pg (ref 26.0–34.0)
MCHC: 32.5 g/dL (ref 30.0–36.0)
MCV: 93.2 fL (ref 78.0–100.0)
Platelets: 230 10*3/uL (ref 150–400)
RBC: 4.56 MIL/uL (ref 4.22–5.81)
RDW: 12.5 % (ref 11.5–15.5)
WBC: 14.6 10*3/uL — AB (ref 4.0–10.5)

## 2015-04-27 LAB — CREATININE, SERUM: CREATININE: 1.24 mg/dL (ref 0.61–1.24)

## 2015-04-27 SURGERY — CLOSURE, ILEOSTOMY, LAPAROSCOPIC, WITH LAPAROTOMY IF INDICATED
Anesthesia: General | Site: Abdomen

## 2015-04-27 MED ORDER — PNEUMOCOCCAL VAC POLYVALENT 25 MCG/0.5ML IJ INJ
0.5000 mL | INJECTION | INTRAMUSCULAR | Status: AC
  Administered 2015-04-28: 0.5 mL via INTRAMUSCULAR
  Filled 2015-04-27: qty 0.5

## 2015-04-27 MED ORDER — CHLORHEXIDINE GLUCONATE 4 % EX LIQD: 60.0000 mL | Freq: Once | CUTANEOUS | Status: DC

## 2015-04-27 MED ORDER — PROMETHAZINE HCL 25 MG/ML IJ SOLN
INTRAMUSCULAR | Status: AC
  Filled 2015-04-27: qty 1

## 2015-04-27 MED ORDER — OXYCODONE HCL 5 MG PO TABS: 5.0000 mg | ORAL_TABLET | Freq: Once | ORAL | Status: AC | PRN

## 2015-04-27 MED ORDER — ALVIMOPAN 12 MG PO CAPS
ORAL_CAPSULE | ORAL | Status: AC
  Filled 2015-04-27: qty 1

## 2015-04-27 MED ORDER — HYDROMORPHONE HCL 1 MG/ML IJ SOLN
INTRAMUSCULAR | Status: DC | PRN
  Administered 2015-04-27 (×2): 0.5 mg via INTRAVENOUS

## 2015-04-27 MED ORDER — GLYCOPYRROLATE 0.2 MG/ML IJ SOLN
INTRAMUSCULAR | Status: DC | PRN
  Administered 2015-04-27: .8 mg via INTRAVENOUS

## 2015-04-27 MED ORDER — MIDAZOLAM HCL 5 MG/5ML IJ SOLN
INTRAMUSCULAR | Status: DC | PRN
  Administered 2015-04-27: 2 mg via INTRAVENOUS

## 2015-04-27 MED ORDER — PROPOFOL 10 MG/ML IV BOLUS
INTRAVENOUS | Status: AC
  Filled 2015-04-27: qty 20

## 2015-04-27 MED ORDER — HYDROMORPHONE HCL 1 MG/ML IJ SOLN
INTRAMUSCULAR | Status: AC
  Filled 2015-04-27: qty 2

## 2015-04-27 MED ORDER — DEXAMETHASONE SODIUM PHOSPHATE 4 MG/ML IJ SOLN
INTRAMUSCULAR | Status: AC
  Filled 2015-04-27: qty 2

## 2015-04-27 MED ORDER — ONDANSETRON HCL 4 MG PO TABS
4.0000 mg | ORAL_TABLET | Freq: Four times a day (QID) | ORAL | Status: DC | PRN
  Administered 2015-04-29: 4 mg via ORAL
  Filled 2015-04-27: qty 1

## 2015-04-27 MED ORDER — DEXTROSE 5 % IV SOLN
2.0000 g | INTRAVENOUS | Status: AC
  Administered 2015-04-27: 2 g via INTRAVENOUS
  Filled 2015-04-27: qty 2

## 2015-04-27 MED ORDER — GLIMEPIRIDE 4 MG PO TABS
4.0000 mg | ORAL_TABLET | Freq: Every day | ORAL | Status: DC
  Administered 2015-04-28 – 2015-05-03 (×5): 4 mg via ORAL
  Filled 2015-04-27 (×8): qty 1

## 2015-04-27 MED ORDER — LACTATED RINGERS IV SOLN
INTRAVENOUS | Status: DC | PRN
  Administered 2015-04-27 (×2): via INTRAVENOUS

## 2015-04-27 MED ORDER — BUPIVACAINE-EPINEPHRINE 0.5% -1:200000 IJ SOLN
INTRAMUSCULAR | Status: DC | PRN
  Administered 2015-04-27: 2 mL

## 2015-04-27 MED ORDER — ASPIRIN 81 MG PO CHEW
81.0000 mg | CHEWABLE_TABLET | Freq: Every day | ORAL | Status: DC
  Administered 2015-04-28 – 2015-05-03 (×6): 81 mg via ORAL
  Filled 2015-04-27 (×6): qty 1

## 2015-04-27 MED ORDER — PHENYLEPHRINE HCL 10 MG/ML IJ SOLN
INTRAMUSCULAR | Status: DC | PRN
Start: 2015-04-27 — End: 2015-04-27
  Administered 2015-04-27: 80 ug via INTRAVENOUS

## 2015-04-27 MED ORDER — ONDANSETRON HCL 4 MG/2ML IJ SOLN
INTRAMUSCULAR | Status: AC
  Filled 2015-04-27: qty 2

## 2015-04-27 MED ORDER — LIDOCAINE HCL (CARDIAC) 20 MG/ML IV SOLN
INTRAVENOUS | Status: AC
  Filled 2015-04-27: qty 5

## 2015-04-27 MED ORDER — OXYCODONE-ACETAMINOPHEN 5-325 MG PO TABS: 1.0000 | ORAL_TABLET | ORAL | Status: DC | PRN

## 2015-04-27 MED ORDER — HYDROMORPHONE HCL 1 MG/ML IJ SOLN
1.0000 mg | INTRAMUSCULAR | Status: DC | PRN
  Administered 2015-04-27 – 2015-04-28 (×5): 1 mg via INTRAVENOUS
  Filled 2015-04-27 (×4): qty 1

## 2015-04-27 MED ORDER — INFLUENZA VAC SPLIT QUAD 0.5 ML IM SUSY
0.5000 mL | PREFILLED_SYRINGE | INTRAMUSCULAR | Status: AC
  Administered 2015-04-28: 0.5 mL via INTRAMUSCULAR
  Filled 2015-04-27: qty 0.5

## 2015-04-27 MED ORDER — HYDROMORPHONE HCL 1 MG/ML IJ SOLN
INTRAMUSCULAR | Status: AC
  Filled 2015-04-27: qty 1

## 2015-04-27 MED ORDER — ONDANSETRON HCL 4 MG/2ML IJ SOLN: 4.0000 mg | Freq: Four times a day (QID) | INTRAMUSCULAR | Status: DC | PRN

## 2015-04-27 MED ORDER — INSULIN GLARGINE 100 UNIT/ML ~~LOC~~ SOLN
10.0000 [IU] | Freq: Every day | SUBCUTANEOUS | Status: DC
  Administered 2015-04-28 – 2015-05-02 (×5): 10 [IU] via SUBCUTANEOUS
  Filled 2015-04-27 (×7): qty 0.1

## 2015-04-27 MED ORDER — ALVIMOPAN 12 MG PO CAPS
12.0000 mg | ORAL_CAPSULE | Freq: Two times a day (BID) | ORAL | Status: DC
  Administered 2015-04-28 – 2015-05-02 (×9): 12 mg via ORAL
  Filled 2015-04-27 (×9): qty 1

## 2015-04-27 MED ORDER — OXYCODONE HCL 5 MG/5ML PO SOLN
5.0000 mg | Freq: Once | ORAL | Status: AC | PRN
  Administered 2015-04-27: 5 mg via ORAL

## 2015-04-27 MED ORDER — BUPIVACAINE-EPINEPHRINE (PF) 0.5% -1:200000 IJ SOLN
INTRAMUSCULAR | Status: AC
  Filled 2015-04-27: qty 30

## 2015-04-27 MED ORDER — HYDROMORPHONE HCL 1 MG/ML IJ SOLN
0.2500 mg | INTRAMUSCULAR | Status: DC | PRN
  Administered 2015-04-27 (×6): 0.5 mg via INTRAVENOUS

## 2015-04-27 MED ORDER — ENOXAPARIN SODIUM 40 MG/0.4ML ~~LOC~~ SOLN
40.0000 mg | SUBCUTANEOUS | Status: DC
  Administered 2015-04-28 – 2015-05-03 (×6): 40 mg via SUBCUTANEOUS
  Filled 2015-04-27 (×6): qty 0.4

## 2015-04-27 MED ORDER — SODIUM CHLORIDE 0.9 % IR SOLN
Status: DC | PRN
  Administered 2015-04-27 (×5): 1000 mL

## 2015-04-27 MED ORDER — DEXTROSE 5 % IV SOLN
2.0000 g | Freq: Two times a day (BID) | INTRAVENOUS | Status: AC
  Administered 2015-04-27: 2 g via INTRAVENOUS
  Filled 2015-04-27 (×2): qty 2

## 2015-04-27 MED ORDER — INSULIN ASPART 100 UNIT/ML ~~LOC~~ SOLN
0.0000 [IU] | Freq: Three times a day (TID) | SUBCUTANEOUS | Status: DC
  Administered 2015-04-27: 7 [IU] via SUBCUTANEOUS
  Administered 2015-04-28: 3 [IU] via SUBCUTANEOUS
  Administered 2015-04-28 – 2015-04-29 (×3): 4 [IU] via SUBCUTANEOUS
  Administered 2015-04-29 – 2015-05-01 (×5): 3 [IU] via SUBCUTANEOUS

## 2015-04-27 MED ORDER — MIDAZOLAM HCL 2 MG/2ML IJ SOLN
INTRAMUSCULAR | Status: AC
  Filled 2015-04-27: qty 4

## 2015-04-27 MED ORDER — ALVIMOPAN 12 MG PO CAPS
12.0000 mg | ORAL_CAPSULE | Freq: Once | ORAL | Status: AC
  Administered 2015-04-27: 12 mg via ORAL

## 2015-04-27 MED ORDER — PROMETHAZINE HCL 25 MG/ML IJ SOLN
6.2500 mg | INTRAMUSCULAR | Status: DC | PRN
  Administered 2015-04-27: 6.25 mg via INTRAVENOUS

## 2015-04-27 MED ORDER — SURGILUBE EX GEL
CUTANEOUS | Status: DC | PRN
  Administered 2015-04-27: 1 via TOPICAL

## 2015-04-27 MED ORDER — FENTANYL CITRATE (PF) 100 MCG/2ML IJ SOLN
INTRAMUSCULAR | Status: DC | PRN
  Administered 2015-04-27 (×7): 50 ug via INTRAVENOUS
  Administered 2015-04-27: 100 ug via INTRAVENOUS
  Administered 2015-04-27: 50 ug via INTRAVENOUS

## 2015-04-27 MED ORDER — LIDOCAINE HCL (CARDIAC) 20 MG/ML IV SOLN
INTRAVENOUS | Status: DC | PRN
  Administered 2015-04-27: 70 mg via INTRAVENOUS

## 2015-04-27 MED ORDER — OXYCODONE HCL 5 MG/5ML PO SOLN
ORAL | Status: AC
  Filled 2015-04-27: qty 5

## 2015-04-27 MED ORDER — CEFOTETAN DISODIUM-DEXTROSE 2-2.08 GM-% IV SOLR
INTRAVENOUS | Status: AC
Start: 2015-04-27 — End: 2015-04-27
  Filled 2015-04-27: qty 50

## 2015-04-27 MED ORDER — POTASSIUM CHLORIDE IN NACL 20-0.9 MEQ/L-% IV SOLN
INTRAVENOUS | Status: DC
  Administered 2015-04-27 – 2015-05-01 (×7): via INTRAVENOUS
  Filled 2015-04-27 (×10): qty 1000

## 2015-04-27 MED ORDER — PROPOFOL 10 MG/ML IV BOLUS
INTRAVENOUS | Status: DC | PRN
  Administered 2015-04-27: 200 mg via INTRAVENOUS

## 2015-04-27 MED ORDER — PHENYLEPHRINE HCL 10 MG/ML IJ SOLN
10.0000 mg | INTRAVENOUS | Status: DC | PRN
  Administered 2015-04-27: 10 ug/min via INTRAVENOUS

## 2015-04-27 MED ORDER — ONDANSETRON HCL 4 MG/2ML IJ SOLN
INTRAMUSCULAR | Status: DC | PRN
  Administered 2015-04-27 (×2): 4 mg via INTRAVENOUS

## 2015-04-27 MED ORDER — FENTANYL CITRATE (PF) 250 MCG/5ML IJ SOLN
INTRAMUSCULAR | Status: AC
  Filled 2015-04-27: qty 5

## 2015-04-27 MED ORDER — NEOSTIGMINE METHYLSULFATE 10 MG/10ML IV SOLN
INTRAVENOUS | Status: DC | PRN
  Administered 2015-04-27: 5 mg via INTRAVENOUS

## 2015-04-27 MED ORDER — METFORMIN HCL 500 MG PO TABS
500.0000 mg | ORAL_TABLET | Freq: Two times a day (BID) | ORAL | Status: DC
  Administered 2015-04-28 – 2015-05-03 (×10): 500 mg via ORAL
  Filled 2015-04-27 (×11): qty 1

## 2015-04-27 MED ORDER — ROCURONIUM BROMIDE 50 MG/5ML IV SOLN
INTRAVENOUS | Status: AC
  Filled 2015-04-27: qty 1

## 2015-04-27 MED ORDER — ROCURONIUM BROMIDE 100 MG/10ML IV SOLN
INTRAVENOUS | Status: DC | PRN
  Administered 2015-04-27: 10 mg via INTRAVENOUS
  Administered 2015-04-27: 50 mg via INTRAVENOUS
  Administered 2015-04-27 (×4): 10 mg via INTRAVENOUS

## 2015-04-27 SURGICAL SUPPLY — 94 items
APPLIER CLIP ROT 10 11.4 M/L (STAPLE)
APR CLP MED LRG 11.4X10 (STAPLE)
BLADE SURG ROTATE 9660 (MISCELLANEOUS) ×3 IMPLANT
CANISTER SUCTION 2500CC (MISCELLANEOUS) ×2 IMPLANT
CELLS DAT CNTRL 66122 CELL SVR (MISCELLANEOUS) IMPLANT
CLAMP ENDO BABCK 10MM (STAPLE) IMPLANT
CLIP APPLIE ROT 10 11.4 M/L (STAPLE) IMPLANT
COVER MAYO STAND STRL (DRAPES) ×4 IMPLANT
COVER SURGICAL LIGHT HANDLE (MISCELLANEOUS) ×5 IMPLANT
DRAIN CHANNEL 19F RND (DRAIN) ×2 IMPLANT
DRAPE LAPAROSCOPIC ABDOMINAL (DRAPES) ×3 IMPLANT
DRAPE PROXIMA HALF (DRAPES) ×3 IMPLANT
DRAPE UTILITY XL STRL (DRAPES) ×6 IMPLANT
DRAPE WARM FLUID 44X44 (DRAPE) ×3 IMPLANT
DRSG OPSITE POSTOP 4X8 (GAUZE/BANDAGES/DRESSINGS) ×3 IMPLANT
DRSG TEGADERM 4X4.75 (GAUZE/BANDAGES/DRESSINGS) ×2 IMPLANT
DRSG TELFA 3X8 NADH (GAUZE/BANDAGES/DRESSINGS) ×3 IMPLANT
ELECT BLADE 6.5 EXT (BLADE) ×2 IMPLANT
ELECT CAUTERY BLADE 6.4 (BLADE) ×6 IMPLANT
ELECT REM PT RETURN 9FT ADLT (ELECTROSURGICAL) ×3
ELECTRODE REM PT RTRN 9FT ADLT (ELECTROSURGICAL) ×1 IMPLANT
EVACUATOR SILICONE 100CC (DRAIN) ×4 IMPLANT
GAUZE SPONGE 2X2 8PLY STRL LF (GAUZE/BANDAGES/DRESSINGS) ×1 IMPLANT
GAUZE SPONGE 4X4 12PLY STRL (GAUZE/BANDAGES/DRESSINGS) ×3 IMPLANT
GEL ULTRASOUND 20GR AQUASONIC (MISCELLANEOUS) ×2 IMPLANT
GLOVE BIO SURGEON STRL SZ7 (GLOVE) ×6 IMPLANT
GLOVE BIO SURGEON STRL SZ7.5 (GLOVE) ×6 IMPLANT
GLOVE BIOGEL PI IND STRL 6.5 (GLOVE) IMPLANT
GLOVE BIOGEL PI IND STRL 7.0 (GLOVE) IMPLANT
GLOVE BIOGEL PI IND STRL 8 (GLOVE) IMPLANT
GLOVE BIOGEL PI INDICATOR 6.5 (GLOVE) ×10
GLOVE BIOGEL PI INDICATOR 7.0 (GLOVE) ×4
GLOVE BIOGEL PI INDICATOR 8 (GLOVE) ×4
GLOVE EUDERMIC 7 POWDERFREE (GLOVE) ×9 IMPLANT
GLOVE SURG SS PI 6.5 STRL IVOR (GLOVE) ×4 IMPLANT
GOWN STRL REUS W/ TWL LRG LVL3 (GOWN DISPOSABLE) ×2 IMPLANT
GOWN STRL REUS W/ TWL XL LVL3 (GOWN DISPOSABLE) ×1 IMPLANT
GOWN STRL REUS W/TWL LRG LVL3 (GOWN DISPOSABLE) ×12
GOWN STRL REUS W/TWL XL LVL3 (GOWN DISPOSABLE) ×15
KIT BASIN OR (CUSTOM PROCEDURE TRAY) ×3 IMPLANT
KIT ROOM TURNOVER OR (KITS) ×3 IMPLANT
LEGGING LITHOTOMY PAIR STRL (DRAPES) ×2 IMPLANT
NS IRRIG 1000ML POUR BTL (IV SOLUTION) ×13 IMPLANT
PAD ARMBOARD 7.5X6 YLW CONV (MISCELLANEOUS) ×6 IMPLANT
PAD DRESSING TELFA 3X8 NADH (GAUZE/BANDAGES/DRESSINGS) IMPLANT
PENCIL BUTTON HOLSTER BLD 10FT (ELECTRODE) ×5 IMPLANT
RETRACTOR WND ALEXIS 18 MED (MISCELLANEOUS) IMPLANT
RTRCTR WOUND ALEXIS 18CM MED (MISCELLANEOUS)
SCALPEL HARMONIC ACE (MISCELLANEOUS) IMPLANT
SCISSORS LAP 5X35 DISP (ENDOMECHANICALS) IMPLANT
SET IRRIG TUBING LAPAROSCOPIC (IRRIGATION / IRRIGATOR) IMPLANT
SLEEVE ENDOPATH XCEL 5M (ENDOMECHANICALS) IMPLANT
SPECIMEN JAR LARGE (MISCELLANEOUS) ×1 IMPLANT
SPONGE GAUZE 2X2 STER 10/PKG (GAUZE/BANDAGES/DRESSINGS) ×2
SPONGE LAP 18X18 X RAY DECT (DISPOSABLE) ×4 IMPLANT
STAPLER CUT CVD 40MM BLUE (STAPLE) ×2 IMPLANT
STAPLER CUT CVD 40MM GREEN (STAPLE) ×2 IMPLANT
STAPLER PROXIMATE 75MM BLUE (STAPLE) ×2 IMPLANT
STAPLER VISISTAT 35W (STAPLE) ×3 IMPLANT
SUCTION POOLE TIP (SUCTIONS) ×2 IMPLANT
SUT ETHILON 3 0 PS 1 (SUTURE) ×6 IMPLANT
SUT NOVA NAB DX-16 0-1 5-0 T12 (SUTURE) ×4 IMPLANT
SUT PDS AB 1 CT  36 (SUTURE) ×6
SUT PDS AB 1 CT 36 (SUTURE) IMPLANT
SUT PDS AB 1 TP1 96 (SUTURE) ×4 IMPLANT
SUT PROLENE 0 SH 30 (SUTURE) ×2 IMPLANT
SUT PROLENE 2 0 SH DA (SUTURE) ×3 IMPLANT
SUT SILK 2 0 SH CR/8 (SUTURE) ×2 IMPLANT
SUT SILK 2 0 TIES 10X30 (SUTURE) ×3 IMPLANT
SUT SILK 3 0 SH CR/8 (SUTURE) ×3 IMPLANT
SUT SILK 3 0 TIES 10X30 (SUTURE) ×3 IMPLANT
SUT VIC AB 0 CT1 27 (SUTURE)
SUT VIC AB 0 CT1 27XBRD ANBCTR (SUTURE) ×3 IMPLANT
SUT VIC AB 3-0 SH 18 (SUTURE) IMPLANT
SYR BULB IRRIGATION 50ML (SYRINGE) ×4 IMPLANT
SYS LAPSCP GELPORT 120MM (MISCELLANEOUS)
SYSTEM LAPSCP GELPORT 120MM (MISCELLANEOUS) IMPLANT
TAPE CLOTH SURG 4X10 WHT LF (GAUZE/BANDAGES/DRESSINGS) ×2 IMPLANT
TOWEL OR 17X24 6PK STRL BLUE (TOWEL DISPOSABLE) ×3 IMPLANT
TOWEL OR 17X26 10 PK STRL BLUE (TOWEL DISPOSABLE) ×3 IMPLANT
TRAY FOLEY CATH 14FRSI W/METER (CATHETERS) ×3 IMPLANT
TRAY LAPAROSCOPIC MC (CUSTOM PROCEDURE TRAY) ×3 IMPLANT
TRAY PROCTOSCOPIC FIBER OPTIC (SET/KITS/TRAYS/PACK) ×4 IMPLANT
TROCAR XCEL 12X100 BLDLESS (ENDOMECHANICALS) IMPLANT
TROCAR XCEL BLADELESS 5X75MML (TROCAR) ×1 IMPLANT
TROCAR XCEL BLUNT TIP 100MML (ENDOMECHANICALS) ×1 IMPLANT
TROCAR XCEL NON-BLD 11X100MML (ENDOMECHANICALS) IMPLANT
TROCAR XCEL NON-BLD 5MMX100MML (ENDOMECHANICALS) ×2 IMPLANT
TUBE CONNECTING 12'X1/4 (SUCTIONS) ×3
TUBE CONNECTING 12X1/4 (SUCTIONS) ×4 IMPLANT
TUBING FILTER THERMOFLATOR (ELECTROSURGICAL) IMPLANT
TUBING INSUFFLATION (TUBING) ×3 IMPLANT
WATER STERILE IRR 1000ML POUR (IV SOLUTION) ×3 IMPLANT
YANKAUER SUCT BULB TIP NO VENT (SUCTIONS) ×5 IMPLANT

## 2015-04-27 NOTE — Op Note (Addendum)
Patient Name:           Leroy Chen   Date of Surgery:        04/27/2015  Pre op Diagnosis:      Diverticulitis with colostomy  Post op Diagnosis:    Diverticulitis with colostomy, extensive intra-abdominal adhesions  Procedure:                 Rigid proctoscopy                                      Diagnostic laparoscopy                                      60 minute lysis of adhesions                                      Laparotomy with resection and closure of colostomy with 29 mm EEA stapler  Surgeon:                     Angelia Mould. Derrell Lolling, M.D., FACS  Assistant:                    Axel Filler, M.D., [facility]  Operative Indications:   The patient is a 59 year old male who presents with diverticulitis. This gentleman returns to discuss colostomy closure. He is very much in favor of proceeding with that surgery. In March 2016 he underwent laparotomy, sigmoid colectomy and colostomy and drainage of a retroperitoneal abscess following failed medical management with percutaneous drain. I stapled off the rectum at least 6 cm above the peritoneal reflection. He is basically recovered. He is followed by Dr. Kirby Funk.  His BMI is still 31 but he's lost a few pounds. He does not have any abdominal pain. His midline wound is completely healed. Barium enema shows that he seems to have enough length both above and below and no other pathologic changes. Last colonoscopy March 2013 by Dr. Danise Edge showing diverticulosis otherwise normal. Comorbidities include the tobacco abuse, , type 2 diabetes non-insulin-dependent, hyperlipidemia, history of atrial fibrillation with RVR, history of hypertension but has been taken off of Lopressor. He underwent mechanical and antibiotic bowel prep at home yesterday.  He is brought to the operating room electively for resection and closure of his colostomy.  Operative Findings:       The patient had very extensive intra-abdominal and  adhesions involving the omentum and numerous loops of small bowel to each other and to the anterior abdominal wall.  It took 1 hour to take all of these adhesions down.  A laparoscopic approach was abandoned after visualization of the extent of adhesions.  The abdominal wall muscle and fascia was extremely thick and fibrotic, almost rockhard.  The rectal stump was healthy.  The descending colon and proximal sigmoid were healthy.  We were able to get a stapled anastomosis with good blood supply, no tension, and no air leak.  Procedure in Detail:          Following the induction of general endotracheal anesthesia a Foley catheter was placed and the patient was placed in rigid padded stirrups.  Surgical timeout was performed.  Intravenous antibiotics were given.  I performed a rigid proctoscopy to about 15 cm.  I could not clearly past the scope all the way to the end of the rectal stump.  This was due to angulation.  The colon and rectum looked healthy and clean otherwise.      The abdomen and perianal area was then prepped and draped in a sterile fashion.  Another surgical timeout was performed.  5 mm optical trocar was placed in the left subcostal region without difficulty.  Pneumoperitoneum was created.  Video camera was inserted with  visualization and findings as described above.  After about 4 or 5 minutes I abandoned the laparoscopic approach due to the degree of adhesions.  The pneumoperitoneum was released and trocar was removed.  This incision was closed with a staple.      Midline laparotomy incision was made, excising the old scar.  The fibrosis was very intense.  We divided the fascia superiorly and entered the peritoneal space.  I spent a long time taking the adhesions down and opening the incision.  Ultimately I had all the  adhesions taken down and I could pack the omentum and the small bowel superiorly with a self-retaining retractor.  I could see the Prolene sutures on the rectal stump.  I  mobilized this up and dissected out its full length.  Dr. Derrell Lolling passed a dilator from the anus up to the rectal stump and the dilator went all the way up to the end without too much difficulty.  I transected about 2 cm of the rectal stump with a contour stapler to freshen it up.     I then cut the colostomy out of the left abdominal wall, dissecting it away from the subcutaneous tissue and muscle fascia and delivered it back into the abdominal cavity.  Several other small bowel adhesions were taken down at this point in time.  I divided all the lateral peritoneal attachments of the descending colon and found that I had excellent length and did not need to take the splenic flexure down.  I transected about 4-5 cm of the colostomy with a GIA stapling device.      I resected the staple line on the proximal colon and dilated it and found that it would accommodate a 29 mm stapler.  Pursestring suture of 0 Prolene was placed in the proximal colon and the anvil of the 29 mm EE stapler was placed and pursestring suture tied down.  Dr. Derrell Lolling passed the stapler from the rectum slowly up to the end of the rectal stump.  He opened the stapler and the spike came out through the anterior wall of the rectal stump.  I took great care to avoid twisting the proximal colon and then secured it to the stapler.  The stapler was closed, held in place for 1 minute, fired, opened and removed.  We had 2 complete doughnuts of tissue.  The anastomosis looked good.  Rigid proctoscopy was then performed and the anastomosis was insufflated.  The air went past the anastomosis and there was no bubbles and no air leak.  We felt this was very satisfactory.  There was no bleeding.  We copiously irrigated the abdomen and pelvis.     At this point we changed over the drapes, estimates, gowns and gloves.  The abdomen was irrigated one more time.  Small bowel and omentum were returned to their anatomic positions.  I closed the posterior rectus  sheath at the colostomy site with a running suture of #  1 PDS.  I closed the anterior rectus sheath of the colostomy site with a running suture of #1 PDS.  The midline fascia was closed with running suture of #1 double-stranded PDS and several interrupted #1 Novafil's.  The wounds were irrigated.  Skin was closed loosely with staples and nylon sutures.  Telfa wicks were placed in between the staples and sutures.  Honeycomb bandage was placed.  Patient tolerated the procedure well and was taken to PACU in stable condition.  EBL 100 mL.  Counts correct.  Complications none.            Angelia Mould. Derrell Lolling, M.D., FACS General and Minimally Invasive Surgery Breast and Colorectal Surgery  04/27/2015 11:13 AM

## 2015-04-27 NOTE — Care Management Note (Signed)
Case Management Note  Patient Details  Name: Leroy Chen MRN: [id number] Date of Birth: 1956/03/07  Subjective/Objective:                    Action/Plan:  Initial UR completed .  Expected Discharge Date:                  Expected Discharge Plan:  Home/Self Care  In-House Referral:     Discharge planning Services     Post Acute Care Choice:    Choice offered to:     DME Arranged:    DME Agency:     HH Arranged:    HH Agency:     Status of Service:  In process, will continue to follow  Medicare Important Message Given:    Date Medicare IM Given:    Medicare IM give by:    Date Additional Medicare IM Given:    Additional Medicare Important Message give by:     If discussed at Long Length of Stay Meetings, dates discussed:    Additional Comments:  Kingsley Plan, RN 04/27/2015, 3:26 PM

## 2015-04-27 NOTE — Interval H&P Note (Signed)
History and Physical Interval Note:  04/27/2015 6:22 AM  Leroy Chen  has presented today for surgery, with the diagnosis of diverticulitis with colostomy in place  The various methods of treatment have been discussed with the patient and family. After consideration of risks, benefits and other options for treatment, the patient has consented to  Procedure(s): LAPAROSCOPIC ASSISTED  RESECTION AND CLOSURE  COLOSTOMY  (N/A) as a surgical intervention .  The patient's history has been reviewed, patient examined, no change in status, stable for surgery.  I have reviewed the patient's chart and labs.  Questions were answered to the patient's satisfaction.     [name]

## 2015-04-27 NOTE — Anesthesia Preprocedure Evaluation (Addendum)
Anesthesia Evaluation  Patient identified by MRN, date of birth, ID band Patient awake    Reviewed: Allergy & Precautions, NPO status , Patient's Chart, lab work & pertinent test results  Airway Mallampati: II  TM Distance: >3 FB Neck ROM: Full    Dental  (+) Dental Advisory Given   Pulmonary Current Smoker,    breath sounds clear to auscultation       Cardiovascular negative cardio ROS   Rhythm:Regular Rate:Normal  Hx of afib with RVR   Neuro/Psych negative neurological ROS     GI/Hepatic negative GI ROS, Neg liver ROS,   Endo/Other  diabetes, Type 2, Oral Hypoglycemic Agents  Renal/GU      Musculoskeletal   Abdominal   Peds  Hematology negative hematology ROS (+)   Anesthesia Other Findings   Reproductive/Obstetrics                            Anesthesia Physical Anesthesia Plan  ASA: II  Anesthesia Plan: General   Post-op Pain Management:    Induction: Intravenous  Airway Management Planned: Oral ETT  Additional Equipment:   Intra-op Plan:   Post-operative Plan: Extubation in OR  Informed Consent: I have reviewed the patients History and Physical, chart, labs and discussed the procedure including the risks, benefits and alternatives for the proposed anesthesia with the patient or authorized representative who has indicated his/her understanding and acceptance.   Dental advisory given  Plan Discussed with: CRNA  Anesthesia Plan Comments:         Anesthesia Quick Evaluation

## 2015-04-27 NOTE — Anesthesia Postprocedure Evaluation (Signed)
  Anesthesia Post-op Note  Patient: Leroy Chen  Procedure(s) Performed: Procedure(s) (LRB): DIAGNOSTIC LAPAROSCOPY,  EXPLORATORY LAPAROTOMY WITH RESECTION AND CLOSURE OF COLOSTOMY  (N/A)  Patient Location: PACU  Anesthesia Type: General  Level of Consciousness: awake and alert   Airway and Oxygen Therapy: Patient Spontanous Breathing  Post-op Pain: mild  Post-op Assessment: Post-op Vital signs reviewed, Patient's Cardiovascular Status Stable, Respiratory Function Stable, Patent Airway and No signs of Nausea or vomiting  Last Vitals:  Filed Vitals:   04/27/15 1330  BP: 159/72  Pulse: 93  Temp:   Resp: 21    Post-op Vital Signs: stable   Complications: No apparent anesthesia complications

## 2015-04-27 NOTE — Transfer of Care (Signed)
Immediate Anesthesia Transfer of Care Note  Patient: Leroy Chen  Procedure(s) Performed: Procedure(s): DIAGNOSTIC LAPAROSCOPY,  EXPLORATORY LAPAROTOMY WITH RESECTION AND CLOSURE OF COLOSTOMY  (N/A)  Patient Location: PACU  Anesthesia Type:General  Level of Consciousness: awake, alert , oriented and patient cooperative  Airway & Oxygen Therapy: Patient Spontanous Breathing and Patient connected to nasal cannula oxygen  Post-op Assessment: Report given to RN and Post -op Vital signs reviewed and stable  Post vital signs: Reviewed and stable  Last Vitals:  Filed Vitals:   04/27/15 0636  BP: 129/80  Pulse: 73  Temp: 36.7 C  Resp: 20    Complications: No apparent anesthesia complications

## 2015-04-28 ENCOUNTER — Encounter (HOSPITAL_COMMUNITY): Payer: Self-pay | Admitting: General Surgery

## 2015-04-28 LAB — CBC
HEMATOCRIT: 42.2 % (ref 39.0–52.0)
HEMOGLOBIN: 13.7 g/dL (ref 13.0–17.0)
MCH: 30.6 pg (ref 26.0–34.0)
MCHC: 32.5 g/dL (ref 30.0–36.0)
MCV: 94.2 fL (ref 78.0–100.0)
PLATELETS: 212 10*3/uL (ref 150–400)
RBC: 4.48 MIL/uL (ref 4.22–5.81)
RDW: 12.6 % (ref 11.5–15.5)
WBC: 11.1 10*3/uL — AB (ref 4.0–10.5)

## 2015-04-28 LAB — HEMOGLOBIN A1C
HEMOGLOBIN A1C: 8 % — AB (ref 4.8–5.6)
Mean Plasma Glucose: 183 mg/dL

## 2015-04-28 LAB — BASIC METABOLIC PANEL
ANION GAP: 12 (ref 5–15)
BUN: 12 mg/dL (ref 6–20)
CALCIUM: 8.7 mg/dL — AB (ref 8.9–10.3)
CO2: 25 mmol/L (ref 22–32)
Chloride: 101 mmol/L (ref 101–111)
Creatinine, Ser: 1.29 mg/dL — ABNORMAL HIGH (ref 0.61–1.24)
GFR, EST NON AFRICAN AMERICAN: 60 mL/min — AB (ref >60)
Glucose, Bld: 174 mg/dL — ABNORMAL HIGH (ref 65–99)
Potassium: 4.4 mmol/L (ref 3.5–5.1)
Sodium: 138 mmol/L (ref 135–145)

## 2015-04-28 LAB — GLUCOSE, CAPILLARY
GLUCOSE-CAPILLARY: 174 mg/dL — AB (ref 65–99)
GLUCOSE-CAPILLARY: 171 mg/dL — AB (ref 65–99)
Glucose-Capillary: 134 mg/dL — ABNORMAL HIGH (ref 65–99)

## 2015-04-28 MED ORDER — ONDANSETRON HCL 4 MG/2ML IJ SOLN
4.0000 mg | Freq: Four times a day (QID) | INTRAMUSCULAR | Status: DC | PRN
Start: 2015-04-28 — End: 2015-04-30

## 2015-04-28 MED ORDER — SODIUM CHLORIDE 0.9 % IJ SOLN: 9.0000 mL | INTRAMUSCULAR | Status: DC | PRN

## 2015-04-28 MED ORDER — DIPHENHYDRAMINE HCL 50 MG/ML IJ SOLN: 12.5000 mg | Freq: Four times a day (QID) | INTRAMUSCULAR | Status: DC | PRN

## 2015-04-28 MED ORDER — HYDROMORPHONE 0.3 MG/ML IV SOLN
INTRAVENOUS | Status: DC
  Administered 2015-04-28: 1.25 mg via INTRAVENOUS
  Administered 2015-04-28: 3.6 mg via INTRAVENOUS
  Administered 2015-04-28: 06:00:00 via INTRAVENOUS
  Administered 2015-04-28 – 2015-04-29 (×2): 1.2 mg via INTRAVENOUS
  Administered 2015-04-29: 16:00:00 via INTRAVENOUS
  Administered 2015-04-29: 2.4 mg via INTRAVENOUS
  Administered 2015-04-29: 1.8 mg via INTRAVENOUS
  Administered 2015-04-30: 1.2 mg via INTRAVENOUS
  Filled 2015-04-28 (×3): qty 25

## 2015-04-28 MED ORDER — NALOXONE HCL 0.4 MG/ML IJ SOLN: 0.4000 mg | INTRAMUSCULAR | Status: DC | PRN

## 2015-04-28 MED ORDER — DIPHENHYDRAMINE HCL 12.5 MG/5ML PO ELIX: 12.5000 mg | ORAL_SOLUTION | Freq: Four times a day (QID) | ORAL | Status: DC | PRN

## 2015-04-28 NOTE — Progress Notes (Signed)
1 Day Post-Op  Subjective: Stable and alert.  Denies nausea vomiting or shortness of breath. Good urine output Complains of incisional pain.  Will switch to PCA pump methodology. Morning labs pending. CBGs ranged from 133-210. Operative findings and events discussed with patient in detail.    Objective: Vital signs in last 24 hours: Temp:  [97.3 F (36.3 C)-99.8 F (37.7 C)] 99.1 F (37.3 C) (10/12 0509) Pulse Rate:  [73-96] 89 (10/12 0509) Resp:  [13-22] 18 (10/12 0509) BP: (129-182)/(63-80) 137/67 mmHg (10/12 0509) SpO2:  [96 %-100 %] 96 % (10/12 0509) Weight:  [113.853 kg (251 lb)-115.667 kg (255 lb)] 113.853 kg (251 lb) (10/11 2144) Last BM Date: 04/27/15  Intake/Output from previous day: 10/11 0701 - 10/12 0700 In: 3387.5 [I.V.:3387.5] Out: 1915 [Urine:1855; Blood:60] Intake/Output this shift: Total I/O In: 1200 [I.V.:1200] Out: 1050 [Urine:1050]  General appearance: Alert.  Cooperative.  Mild distress from incisional pain.  Mental status normal. Resp: clear to auscultation bilaterally GI: Abdomen soft.  Appropriately tender.  Nondistended.  Honeycomb bandage  looks good with some old dried blood.  Incisions intact.  Urine clear.  Lab Results:  Results for orders placed or performed during the hospital encounter of 04/27/15 (from the past 24 hour(s))  Glucose, capillary     Status: Abnormal   Collection Time: 04/27/15  6:40 AM  Result Value Ref Range   Glucose-Capillary 165 (H) 65 - 99 mg/dL   Comment 1 Notify RN    Comment 2 Document in Chart   Glucose, capillary     Status: Abnormal   Collection Time: 04/27/15 11:31 AM  Result Value Ref Range   Glucose-Capillary 230 (H) 65 - 99 mg/dL   Comment 1 Notify RN    Comment 2 Document in Chart   Glucose, capillary     Status: Abnormal   Collection Time: 04/27/15  3:05 PM  Result Value Ref Range   Glucose-Capillary 210 (H) 65 - 99 mg/dL   Comment 1 Notify RN   CBC     Status: Abnormal   Collection Time: 04/27/15   3:45 PM  Result Value Ref Range   WBC 14.6 (H) 4.0 - 10.5 K/uL   RBC 4.56 4.22 - 5.81 MIL/uL   Hemoglobin 13.8 13.0 - 17.0 g/dL   HCT 13.242.5 44.039.0 - 10.252.0 %   MCV 93.2 78.0 - 100.0 fL   MCH 30.3 26.0 - 34.0 pg   MCHC 32.5 30.0 - 36.0 g/dL   RDW 72.512.5 36.611.5 - 44.015.5 %   Platelets 230 150 - 400 K/uL  Creatinine, serum     Status: None   Collection Time: 04/27/15  3:45 PM  Result Value Ref Range   Creatinine, Ser 1.24 0.61 - 1.24 mg/dL   GFR calc non Af Amer >60 >60 mL/min   GFR calc Af Amer >60 >60 mL/min  Glucose, capillary     Status: Abnormal   Collection Time: 04/27/15  4:58 PM  Result Value Ref Range   Glucose-Capillary 202 (H) 65 - 99 mg/dL   Comment 1 Notify RN   Glucose, capillary     Status: Abnormal   Collection Time: 04/27/15  9:38 PM  Result Value Ref Range   Glucose-Capillary 133 (H) 65 - 99 mg/dL   Comment 1 Notify RN      Studies/Results: No results found.  Marland Kitchen. alvimopan  12 mg Oral BID  . aspirin  81 mg Oral Daily  . enoxaparin (LOVENOX) injection  40 mg Subcutaneous Q24H  . glimepiride  4 mg Oral QAC breakfast  . Influenza vac split quadrivalent PF  0.5 mL Intramuscular Tomorrow-1000  . insulin aspart  0-20 Units Subcutaneous TID WC  . insulin glargine  10 Units Subcutaneous QHS  . metFORMIN  500 mg Oral BID WC  . pneumococcal 23 valent vaccine  0.5 mL Intramuscular Tomorrow-1000     Assessment/Plan: s/p Procedure(s): DIAGNOSTIC LAPAROSCOPY,  EXPLORATORY LAPAROTOMY WITH RESECTION AND CLOSURE OF COLOSTOMY   POD #1.  Extensive lysis of adhesions.  Resection and closure of colostomy.  EEA stapled Coloproctostomy Mobilize out of bed. Incentive spirometry Entereg Check labs Sips clear liquids Switch to full dose Dilaudid PCA Foley out tomorrow.  Diabetes mellitus-2.  Continue Lantus, sliding scale, and oral medications Monitor. Goal CBGs 120-180.  Modify insulin as needed.  Tobacco abuse.  Encourage incentive spirometry and mobilization  @   LOS: 1 day    Tessia Kassin M 04/28/2015  . .prob

## 2015-04-29 LAB — GLUCOSE, CAPILLARY
GLUCOSE-CAPILLARY: 129 mg/dL — AB (ref 65–99)
GLUCOSE-CAPILLARY: 154 mg/dL — AB (ref 65–99)
GLUCOSE-CAPILLARY: 131 mg/dL — AB (ref 65–99)

## 2015-04-29 LAB — CBC
HCT: 39.1 % (ref 39.0–52.0)
HEMOGLOBIN: 12.3 g/dL — AB (ref 13.0–17.0)
MCH: 29.6 pg (ref 26.0–34.0)
MCHC: 31.5 g/dL (ref 30.0–36.0)
MCV: 94.2 fL (ref 78.0–100.0)
PLATELETS: 202 10*3/uL (ref 150–400)
RBC: 4.15 MIL/uL — AB (ref 4.22–5.81)
RDW: 12.5 % (ref 11.5–15.5)
WBC: 11 10*3/uL — AB (ref 4.0–10.5)

## 2015-04-29 LAB — BASIC METABOLIC PANEL
ANION GAP: 7 (ref 5–15)
BUN: 9 mg/dL (ref 6–20)
CHLORIDE: 102 mmol/L (ref 101–111)
CO2: 25 mmol/L (ref 22–32)
Calcium: 8.4 mg/dL — ABNORMAL LOW (ref 8.9–10.3)
Creatinine, Ser: 1.14 mg/dL (ref 0.61–1.24)
Glucose, Bld: 130 mg/dL — ABNORMAL HIGH (ref 65–99)
POTASSIUM: 4.6 mmol/L (ref 3.5–5.1)
SODIUM: 134 mmol/L — AB (ref 135–145)

## 2015-04-29 MED ORDER — PANTOPRAZOLE SODIUM 40 MG IV SOLR
40.0000 mg | Freq: Two times a day (BID) | INTRAVENOUS | Status: DC
  Administered 2015-04-29 – 2015-05-02 (×7): 40 mg via INTRAVENOUS
  Filled 2015-04-29 (×7): qty 40

## 2015-04-29 NOTE — Progress Notes (Signed)
Cbg at 2200 was 141.

## 2015-04-29 NOTE — Progress Notes (Signed)
2 Days Post-Op  Subjective:  Stable and alert.  Low-grade temp 100.4, now resolved Vital signs stable Better pain control with PCA pump Ambulating in hall Tolerating limited clear liquids without nausea or vomiting No stool or flatus.  Feels a little gassy. Pathology report of rectal stump and ostomy showed benign findings.  As expected. CBGs under good control.  Now ranging from 174-130. WBC 11,000.  Hemoglobin 12.3.  Potassium 4.6.  Creatinine 1.14.  Objective: Vital signs in last 24 hours: Temp:  [98.6 F (37 C)-100.4 F (38 C)] 99 F (37.2 C) (10/13 0529) Pulse Rate:  [87-92] 89 (10/13 0529) Resp:  [20-31] 21 (10/13 0529) BP: (136-155)/(66-77) 155/77 mmHg (10/13 0529) SpO2:  [92 %-98 %] 98 % (10/13 0145) Last BM Date: 04/27/15  Intake/Output from previous day: 10/12 0701 - 10/13 0700 In: 2105.4 [P.O.:620; I.V.:1485.4] Out: 1700 [Urine:1700] Intake/Output this shift: Total I/O In: 220 [P.O.:220] Out: 850 [Urine:850]   EXAM: General appearance: Alert and cooperative.  No distress. Resp: clear to auscultation bilaterally GI: Soft.  Borderline distended.  Borderline tympanitic.  Wound clean and dry with honeycomb bandage in place.  Rare bowel sound tinkles. Extremities: No tenderness or swelling.  Lab Results:  Results for orders placed or performed during the hospital encounter of 04/27/15 (from the past 24 hour(s))  Glucose, capillary     Status: Abnormal   Collection Time: 04/28/15  7:30 AM  Result Value Ref Range   Glucose-Capillary 174 (H) 65 - 99 mg/dL   Comment 1 Notify RN   Glucose, capillary     Status: Abnormal   Collection Time: 04/28/15 11:38 AM  Result Value Ref Range   Glucose-Capillary 171 (H) 65 - 99 mg/dL   Comment 1 Notify RN   Glucose, capillary     Status: Abnormal   Collection Time: 04/28/15  4:53 PM  Result Value Ref Range   Glucose-Capillary 134 (H) 65 - 99 mg/dL   Comment 1 Notify RN   CBC     Status: Abnormal   Collection Time:  04/29/15  4:39 AM  Result Value Ref Range   WBC 11.0 (H) 4.0 - 10.5 K/uL   RBC 4.15 (L) 4.22 - 5.81 MIL/uL   Hemoglobin 12.3 (L) 13.0 - 17.0 g/dL   HCT 40.9 81.1 - 91.4 %   MCV 94.2 78.0 - 100.0 fL   MCH 29.6 26.0 - 34.0 pg   MCHC 31.5 30.0 - 36.0 g/dL   RDW 78.2 95.6 - 21.3 %   Platelets 202 150 - 400 K/uL  Basic metabolic panel     Status: Abnormal   Collection Time: 04/29/15  4:39 AM  Result Value Ref Range   Sodium 134 (L) 135 - 145 mmol/L   Potassium 4.6 3.5 - 5.1 mmol/L   Chloride 102 101 - 111 mmol/L   CO2 25 22 - 32 mmol/L   Glucose, Bld 130 (H) 65 - 99 mg/dL   BUN 9 6 - 20 mg/dL   Creatinine, Ser 0.86 0.61 - 1.24 mg/dL   Calcium 8.4 (L) 8.9 - 10.3 mg/dL   GFR calc non Af Amer >60 >60 mL/min   GFR calc Af Amer >60 >60 mL/min   Anion gap 7 5 - 15     Studies/Results: No results found.  Marland Kitchen alvimopan  12 mg Oral BID  . aspirin  81 mg Oral Daily  . enoxaparin (LOVENOX) injection  40 mg Subcutaneous Q24H  . glimepiride  4 mg Oral QAC breakfast  . HYDROmorphone  PCA 0.3 mg/mL   Intravenous 6 times per day  . insulin aspart  0-20 Units Subcutaneous TID WC  . insulin glargine  10 Units Subcutaneous QHS  . metFORMIN  500 mg Oral BID WC     Assessment/Plan: s/p Procedure(s): DIAGNOSTIC LAPAROSCOPY,  EXPLORATORY LAPAROTOMY WITH RESECTION AND CLOSURE OF COLOSTOMY   POD #2. Extensive lysis of adhesions. Resection and closure of colostomy. EEA stapled Coloproctostomy Ambulate more Stay on clear liquids until better signs of resolution of expected ileus Incentive spirometry Entereg full dose Dilaudid PCA Foley out today.  Diabetes mellitus-2. Non-insulin-dependent  Continue Lantus, sliding scale, and oral medications Monitor. CBGs at goal range.Marland Kitchen.  No changes to insulin regimen today.  Tobacco abuse. Encourage incentive spirometry and mobilization  DVT prophylaxis.  On Lovenox.  @PROBHOSP @  LOS: 2 days    Ferdinando Lodge M 04/29/2015  . .prob

## 2015-04-30 LAB — GLUCOSE, CAPILLARY
GLUCOSE-CAPILLARY: 135 mg/dL — AB (ref 65–99)
Glucose-Capillary: 146 mg/dL — ABNORMAL HIGH (ref 65–99)
Glucose-Capillary: 116 mg/dL — ABNORMAL HIGH (ref 65–99)

## 2015-04-30 MED ORDER — HYDROMORPHONE HCL 1 MG/ML IJ SOLN
1.0000 mg | INTRAMUSCULAR | Status: DC | PRN
  Administered 2015-04-30 – 2015-05-01 (×7): 1 mg via INTRAVENOUS
  Filled 2015-04-30 (×7): qty 1

## 2015-04-30 NOTE — Progress Notes (Signed)
3 Days Post-Op  Subjective: Stable and alert. Says he feels bloated and gassy but hasn't passed any stool or flatus yet Denies nausea but anorexic Voiding without difficulty Ambulating in halls Afebrile.  Vital signs stable CBGs with good control  Objective: Vital signs in last 24 hours: Temp:  [98.4 F (36.9 C)-99.4 F (37.4 C)] 98.6 F (37 C) (10/14 0519) Pulse Rate:  [88-94] 88 (10/14 0519) Resp:  [18-28] 18 (10/14 0519) BP: (130-175)/(63-85) 149/74 mmHg (10/14 0519) SpO2:  [94 %-98 %] 95 % (10/14 0519) Last BM Date: 04/27/15  Intake/Output from previous day: 10/13 0701 - 10/14 0700 In: 760 [P.O.:360; I.V.:400] Out: 1125 [Urine:1125] Intake/Output this shift: Total I/O In: 400 [I.V.:400] Out: 850 [Urine:850]    EXAM: General appearance: Alert.  Says he's a little uncomfortable.  Pain score 2-4.  Mental status normal.  Skin warm and dry. Resp: clear to auscultation bilaterally GI: Soft.  Active bowel sounds.  A little bit distended and tympanitic.  Honeycomb bandage and Telfa wicks removed.  Wound is intact.  Redressed with 4 x 4's.  Lab Results:  Results for orders placed or performed during the hospital encounter of 04/27/15 (from the past 24 hour(s))  Glucose, capillary     Status: Abnormal   Collection Time: 04/29/15  7:44 AM  Result Value Ref Range   Glucose-Capillary 129 (H) 65 - 99 mg/dL  Glucose, capillary     Status: Abnormal   Collection Time: 04/29/15 12:00 PM  Result Value Ref Range   Glucose-Capillary 154 (H) 65 - 99 mg/dL  Glucose, capillary     Status: Abnormal   Collection Time: 04/29/15  4:38 PM  Result Value Ref Range   Glucose-Capillary 131 (H) 65 - 99 mg/dL     Studies/Results: No results found.  Marland Kitchen. alvimopan  12 mg Oral BID  . aspirin  81 mg Oral Daily  . enoxaparin (LOVENOX) injection  40 mg Subcutaneous Q24H  . glimepiride  4 mg Oral QAC breakfast  . insulin aspart  0-20 Units Subcutaneous TID WC  . insulin glargine  10 Units  Subcutaneous QHS  . metFORMIN  500 mg Oral BID WC  . pantoprazole (PROTONIX) IV  40 mg Intravenous Q12H     Assessment/Plan: s/p Procedure(s): DIAGNOSTIC LAPAROSCOPY,  EXPLORATORY LAPAROTOMY WITH RESECTION AND CLOSURE OF COLOSTOMY   POD #3. Extensive lysis of adhesions. Resection and closure of colostomy. EEA stapled Coloproctostomy Ambulate more Stay on clear liquids  Ileus not unexpected considering extent of adhesion lysis. Incentive spirometry Entereg Try to reduce narcotic   Reflux symptoms yesterday.  Better controlled with twice a day Protonix.  Diabetes mellitus-2. Non-insulin-dependent  Continue Lantus, sliding scale, and oral medications Monitor. CBGs at goal range.Marland Kitchen. No changes to insulin regimen today.  Tobacco abuse. Encourage incentive spirometry and mobilization  DVT prophylaxis. On Lovenox.  I told him yesterday and again today that I will be out of town for several days.  Our call team will take care of him this weekend and Dr. Derrell Lollingamirez will assume his care on Monday.  @PROBHOSP @  LOS: 3 days    Caralyn Twining M 04/30/2015  . .prob

## 2015-05-01 ENCOUNTER — Inpatient Hospital Stay (HOSPITAL_COMMUNITY): Payer: 59

## 2015-05-01 LAB — BASIC METABOLIC PANEL
ANION GAP: 9 (ref 5–15)
BUN: 8 mg/dL (ref 6–20)
CALCIUM: 8.6 mg/dL — AB (ref 8.9–10.3)
CO2: 23 mmol/L (ref 22–32)
Chloride: 105 mmol/L (ref 101–111)
Creatinine, Ser: 0.95 mg/dL (ref 0.61–1.24)
GLUCOSE: 156 mg/dL — AB (ref 65–99)
Potassium: 5 mmol/L (ref 3.5–5.1)
SODIUM: 137 mmol/L (ref 135–145)

## 2015-05-01 LAB — CBC
HCT: 38.8 % — ABNORMAL LOW (ref 39.0–52.0)
HEMOGLOBIN: 12.8 g/dL — AB (ref 13.0–17.0)
MCH: 30.8 pg (ref 26.0–34.0)
MCHC: 33 g/dL (ref 30.0–36.0)
MCV: 93.3 fL (ref 78.0–100.0)
Platelets: 261 10*3/uL (ref 150–400)
RBC: 4.16 MIL/uL — ABNORMAL LOW (ref 4.22–5.81)
RDW: 12.4 % (ref 11.5–15.5)
WBC: 9.8 10*3/uL (ref 4.0–10.5)

## 2015-05-01 LAB — GLUCOSE, CAPILLARY
GLUCOSE-CAPILLARY: 141 mg/dL — AB (ref 65–99)
GLUCOSE-CAPILLARY: 127 mg/dL — AB (ref 65–99)
Glucose-Capillary: 126 mg/dL — ABNORMAL HIGH (ref 65–99)
Glucose-Capillary: 139 mg/dL — ABNORMAL HIGH (ref 65–99)
Glucose-Capillary: 101 mg/dL — ABNORMAL HIGH (ref 65–99)
Glucose-Capillary: 85 mg/dL (ref 65–99)
Glucose-Capillary: 79 mg/dL (ref 65–99)

## 2015-05-01 MED ORDER — SODIUM CHLORIDE 0.9 % IV SOLN: 250.0000 mL | INTRAVENOUS | Status: DC | PRN

## 2015-05-01 MED ORDER — SODIUM CHLORIDE 0.9 % IJ SOLN
3.0000 mL | Freq: Two times a day (BID) | INTRAMUSCULAR | Status: DC
  Administered 2015-05-01 – 2015-05-02 (×3): 3 mL via INTRAVENOUS

## 2015-05-01 MED ORDER — ALUM & MAG HYDROXIDE-SIMETH 200-200-20 MG/5ML PO SUSP
30.0000 mL | Freq: Three times a day (TID) | ORAL | Status: AC
  Administered 2015-05-01 – 2015-05-02 (×3): 30 mL via ORAL
  Filled 2015-05-01 (×3): qty 30

## 2015-05-01 MED ORDER — PHENOL 1.4 % MT LIQD: 2.0000 | OROMUCOSAL | Status: DC | PRN

## 2015-05-01 MED ORDER — SODIUM CHLORIDE 0.9 % IJ SOLN: 3.0000 mL | Freq: Two times a day (BID) | INTRAMUSCULAR | Status: DC

## 2015-05-01 MED ORDER — MENTHOL 3 MG MT LOZG: 1.0000 | LOZENGE | OROMUCOSAL | Status: DC | PRN

## 2015-05-01 MED ORDER — LACTATED RINGERS IV BOLUS (SEPSIS): 1000.0000 mL | Freq: Three times a day (TID) | INTRAVENOUS | Status: DC | PRN

## 2015-05-01 MED ORDER — BISACODYL 10 MG RE SUPP: 10.0000 mg | Freq: Two times a day (BID) | RECTAL | Status: DC | PRN

## 2015-05-01 MED ORDER — MAGIC MOUTHWASH: 15.0000 mL | Freq: Four times a day (QID) | ORAL | Status: DC | PRN

## 2015-05-01 MED ORDER — SODIUM CHLORIDE 0.9 % IJ SOLN: 3.0000 mL | INTRAMUSCULAR | Status: DC | PRN

## 2015-05-01 MED ORDER — LIP MEDEX EX OINT
1.0000 "application " | TOPICAL_OINTMENT | Freq: Two times a day (BID) | CUTANEOUS | Status: DC
  Filled 2015-05-01: qty 7

## 2015-05-01 MED ORDER — ALUM & MAG HYDROXIDE-SIMETH 200-200-20 MG/5ML PO SUSP: 30.0000 mL | Freq: Four times a day (QID) | ORAL | Status: DC | PRN

## 2015-05-01 MED ORDER — BLISTEX MEDICATED EX OINT
TOPICAL_OINTMENT | Freq: Two times a day (BID) | CUTANEOUS | Status: DC | PRN
  Filled 2015-05-01: qty 10

## 2015-05-01 MED ORDER — ACETAMINOPHEN 500 MG PO TABS
1000.0000 mg | ORAL_TABLET | Freq: Three times a day (TID) | ORAL | Status: DC
  Administered 2015-05-01 – 2015-05-03 (×5): 1000 mg via ORAL
  Filled 2015-05-01 (×6): qty 2

## 2015-05-01 MED ORDER — SACCHAROMYCES BOULARDII 250 MG PO CAPS
250.0000 mg | ORAL_CAPSULE | Freq: Two times a day (BID) | ORAL | Status: DC
  Administered 2015-05-01 – 2015-05-03 (×5): 250 mg via ORAL
  Filled 2015-05-01 (×5): qty 1

## 2015-05-01 MED ORDER — OXYCODONE HCL 5 MG PO TABS
5.0000 mg | ORAL_TABLET | ORAL | Status: DC | PRN
Start: 2015-05-01 — End: 2015-05-03
  Administered 2015-05-01 – 2015-05-02 (×2): 10 mg via ORAL
  Filled 2015-05-01 (×2): qty 2

## 2015-05-01 NOTE — Progress Notes (Signed)
Beginning to have some cramping gas pains and passed small dark red liquid per rectum. Enc to continue to ambulate as much as possible.

## 2015-05-01 NOTE — Progress Notes (Signed)
Patient ID: Verda Cuminsnthony D Kushner, male   DOB: 10/25/1955, 59 y.o.   MRN: [id number] 4 Days Post-Op  Subjective: Pt feels bloated today and a little indigestion.  Pain is controlled well  Objective: Vital signs in last 24 hours: Temp:  [98.5 F (36.9 C)-99.3 F (37.4 C)] 99.3 F (37.4 C) (10/15 0510) Pulse Rate:  [82-92] 89 (10/15 0510) Resp:  [16-19] 19 (10/15 0510) BP: (164-196)/(70-81) 164/74 mmHg (10/15 0510) SpO2:  [97 %-98 %] 98 % (10/15 0510) Last BM Date: 04/26/15  Intake/Output from previous day: 10/14 0701 - 10/15 0700 In: 2897 [P.O.:420; I.V.:2477] Out: 2100 [Urine:2100] Intake/Output this shift:    PE: Abd: soft, but distended, hypoactive to absent BS, incision is intact with some old bloody drainage.  Colostomy takedown site is close and clean. Heart: regular Lungs: CTAB  Lab Results:   Recent Labs  04/29/15 0439 05/01/15 0436  WBC 11.0* 9.8  HGB 12.3* 12.8*  HCT 39.1 38.8*  PLT 202 261   BMET  Recent Labs  04/29/15 0439 05/01/15 0436  NA 134* 137  K 4.6 5.0  CL 102 105  CO2 25 23  GLUCOSE 130* 156*  BUN 9 8  CREATININE 1.14 0.95  CALCIUM 8.4* 8.6*   PT/INR No results for input(s): LABPROT, INR in the last 72 hours. CMP     Component Value Date/Time   NA 137 05/01/2015 0436   K 5.0 05/01/2015 0436   CL 105 05/01/2015 0436   CO2 23 05/01/2015 0436   GLUCOSE 156* 05/01/2015 0436   BUN 8 05/01/2015 0436   CREATININE 0.95 05/01/2015 0436   CALCIUM 8.6* 05/01/2015 0436   PROT 7.1 04/20/2015 1629   ALBUMIN 3.8 04/20/2015 1629   AST 21 04/20/2015 1629   ALT 21 04/20/2015 1629   ALKPHOS 55 04/20/2015 1629   BILITOT 0.6 04/20/2015 1629   GFRNONAA >60 05/01/2015 0436   GFRAA >60 05/01/2015 0436   Lipase  No results found for: LIPASE     Studies/Results: No results found.  Anti-infectives: Anti-infectives    Start     Dose/Rate Route Frequency Ordered Stop   04/27/15 2000  cefoTEtan (CEFOTAN) 2 g in dextrose 5 % 50 mL IVPB     2  g 100 mL/hr over 30 Minutes Intravenous Every 12 hours 04/27/15 1446 04/27/15 2210   04/27/15 0656  cefoTEtan in Dextrose 5% (CEFOTAN) 2-2.08 GM-% IVPB    Comments:  Ray ChurchBowman, Jennifer   : cabinet override      04/27/15 0656 04/27/15 1914   04/27/15 0654  cefoTEtan (CEFOTAN) 2 g in dextrose 5 % 50 mL IVPB     2 g 100 mL/hr over 30 Minutes Intravenous On call to O.R. 04/27/15 0654 04/27/15 0755       Assessment/Plan   POD #4. Extensive lysis of adhesions. Resection and closure of colostomy. EEA stapled Coloproctostomy Ambulate more Stay on clear liquids, but given ileus and complaints of distention, will check a plain film just to make sure he doesn't need an NGT, although no emesis currently.  Ileus not unexpected considering extent of adhesion lysis. Incentive spirometry Entereg Try to reduce narcotic   Reflux Better controlled with twice a day Protonix.  Diabetes mellitus-2. Non-insulin-dependent  Continue Lantus, sliding scale, and oral medications Monitor. CBGs at goal range.Marland Kitchen. No changes to insulin regimen today.  Tobacco abuse. Encourage incentive spirometry and mobilization  DVT prophylaxis. On Lovenox/SCDs.  LOS: 4 days    Zera Markwardt E 05/01/2015, 11:35 AM Pager: [phone]

## 2015-05-02 LAB — GLUCOSE, CAPILLARY
GLUCOSE-CAPILLARY: 68 mg/dL (ref 65–99)
GLUCOSE-CAPILLARY: 93 mg/dL (ref 65–99)
Glucose-Capillary: 120 mg/dL — ABNORMAL HIGH (ref 65–99)
Glucose-Capillary: 98 mg/dL (ref 65–99)

## 2015-05-02 MED ORDER — ADULT MULTIVITAMIN W/MINERALS CH
1.0000 | ORAL_TABLET | Freq: Every day | ORAL | Status: DC
  Administered 2015-05-02 – 2015-05-03 (×2): 1 via ORAL
  Filled 2015-05-02 (×2): qty 1

## 2015-05-02 MED ORDER — SODIUM CHLORIDE 0.9 % IJ SOLN: 3.0000 mL | INTRAMUSCULAR | Status: DC | PRN

## 2015-05-02 MED ORDER — PANTOPRAZOLE SODIUM 40 MG PO TBEC
40.0000 mg | DELAYED_RELEASE_TABLET | Freq: Two times a day (BID) | ORAL | Status: DC
  Administered 2015-05-02 – 2015-05-03 (×2): 40 mg via ORAL
  Filled 2015-05-02 (×2): qty 1

## 2015-05-02 MED ORDER — SODIUM CHLORIDE 0.9 % IV SOLN: 250.0000 mL | INTRAVENOUS | Status: DC | PRN

## 2015-05-02 MED ORDER — ONE-DAILY MULTI VITAMINS PO TABS: 1.0000 | ORAL_TABLET | Freq: Every day | ORAL | Status: DC

## 2015-05-02 MED ORDER — LACTATED RINGERS IV BOLUS (SEPSIS)
1000.0000 mL | Freq: Three times a day (TID) | INTRAVENOUS | Status: DC | PRN
Start: 2015-05-02 — End: 2015-05-03

## 2015-05-02 MED ORDER — SODIUM CHLORIDE 0.9 % IJ SOLN
3.0000 mL | Freq: Two times a day (BID) | INTRAMUSCULAR | Status: DC
Start: 2015-05-02 — End: 2015-05-03

## 2015-05-02 NOTE — Progress Notes (Signed)
[facility]  [address] [place], [place] [address] Phone: [phone] FAX: [phone]   JOSETH WEIGEL [id number] 1956-06-01   Problem List:   Principal Problem:   Diverticulitis of intestine with abscess without bleeding Active Problems:   Diverticulitis large intestine   5 Days Post-Op  04/27/2015  Patient Name: Leroy Chen  Date of Surgery: 04/27/2015  Pre op Diagnosis: Diverticulitis with colostomy  Post op Diagnosis: Diverticulitis with colostomy, extensive intra-abdominal adhesions  Procedure: Rigid proctoscopy  Diagnostic laparoscopy  60 minute lysis of adhesions  Laparotomy with resection and closure of colostomy with 29 mm EEA stapler  Surgeon: Edsel Petrin. Dalbert Batman, M.D., FACS   Assessment  Improving  Plan:  POD #5.  Ambulating more  Adv diet  Ileus not unexpected considering extent of adhesion lysis. Incentive spirometry Entereg Try to reduce narcotic   Reflux Better controlled with twice a day Protonix & PRN Maalox  Diabetes mellitus-2. Non-insulin-dependent  Continue Lantus, sliding scale, and oral medications Monitor. CBGs at goal range.Marland Kitchen No changes to insulin regimen today.  Tobacco abuse. Encourage incentive spirometry and mobilization  DVT prophylaxis. On Lovenox/SCDs.  Adin Hector, M.D., F.A.C.S. Gastrointestinal and Minimally Invasive Surgery [facility] [address], [place], [place] [address] [phone] Main / Paging   05/02/2015  Subjective:  Walking more Wife in room Having loose BMs Tired of liquids  Objective:  Vital signs:  Filed Vitals:   05/01/15 0510 05/01/15 1500 05/01/15 2050 05/02/15 0554  BP: 164/74 164/67 175/76 166/71   Pulse: 89 80 83 82  Temp: 99.3 F (37.4 C) 98.5 F (36.9 C) 99.1 F (37.3 C) 98.6 F (37 C)  TempSrc: Oral Oral Oral Oral  Resp: 19 16 18 18   Height:      Weight:      SpO2: 98% 100% 97% 99%    Last BM Date: 05/01/15  Intake/Output   Yesterday:  10/15 0701 - 10/16 0700 In: 1460 [P.O.:660; I.V.:800] Out: 1200 [Urine:1200] This shift:  Total I/O In: 280 [P.O.:280] Out: -   Bowel function:  Flatus: y  BM: many loose  Drain: n/a  Physical Exam:  General: Pt awake/alert/oriented x4 in no acute distress Eyes: PERRL, normal EOM.  Sclera clear.  No icterus Neuro: CN II-XII intact w/o focal sensory/motor deficits. Lymph: No head/neck/groin lymphadenopathy Psych:  No delerium/psychosis/paranoia HENT: Normocephalic, Mucus membranes moist.  No thrush Neck: Supple, No tracheal deviation Chest: No chest wall pain w good excursion CV:  Pulses intact.  Regular rhythm MS: Normal AROM mjr joints.  No obvious deformity Abdomen: Soft.  Obese.   Nondistended.  Staples intact - some inferior separation.  Mildly tender at incisions only.  No evidence of peritonitis.  No incarcerated hernias. Ext:  SCDs BLE.  No mjr edema.  No cyanosis Skin: No petechiae / purpura  Results:   Labs: Results for orders placed or performed during the hospital encounter of 04/27/15 (from the past 48 hour(s))  Glucose, capillary     Status: Abnormal   Collection Time: 04/30/15  6:34 PM  Result Value Ref Range   Glucose-Capillary 139 (H) 65 - 99 mg/dL  Glucose, capillary     Status: Abnormal   Collection Time: 04/30/15  9:08 PM  Result Value Ref Range   Glucose-Capillary 135 (H) 65 - 99 mg/dL  Basic metabolic panel     Status: Abnormal   Collection Time: 05/01/15  4:36 AM  Result Value Ref Range  Sodium 137 135 - 145 mmol/L   Potassium 5.0 3.5 - 5.1 mmol/L   Chloride 105 101 - 111 mmol/L   CO2 23 22 - 32 mmol/L   Glucose, Bld 156 (H) 65 - 99 mg/dL   BUN 8 6 - 20 mg/dL   Creatinine, Ser 0.95  0.61 - 1.24 mg/dL   Calcium 8.6 (L) 8.9 - 10.3 mg/dL   GFR calc non Af Amer >60 >60 mL/min   GFR calc Af Amer >60 >60 mL/min    Comment: (NOTE) The eGFR has been calculated using the CKD EPI equation. This calculation has not been validated in all clinical situations. eGFR's persistently <60 mL/min signify possible Chronic Kidney Disease.    Anion gap 9 5 - 15  CBC     Status: Abnormal   Collection Time: 05/01/15  4:36 AM  Result Value Ref Range   WBC 9.8 4.0 - 10.5 K/uL   RBC 4.16 (L) 4.22 - 5.81 MIL/uL   Hemoglobin 12.8 (L) 13.0 - 17.0 g/dL   HCT 38.8 (L) 39.0 - 52.0 %   MCV 93.3 78.0 - 100.0 fL   MCH 30.8 26.0 - 34.0 pg   MCHC 33.0 30.0 - 36.0 g/dL   RDW 12.4 11.5 - 15.5 %   Platelets 261 150 - 400 K/uL  Glucose, capillary     Status: Abnormal   Collection Time: 05/01/15  7:33 AM  Result Value Ref Range   Glucose-Capillary 127 (H) 65 - 99 mg/dL  Glucose, capillary     Status: Abnormal   Collection Time: 05/01/15 12:32 PM  Result Value Ref Range   Glucose-Capillary 101 (H) 65 - 99 mg/dL  Glucose, capillary     Status: None   Collection Time: 05/01/15  5:12 PM  Result Value Ref Range   Glucose-Capillary 85 65 - 99 mg/dL  Glucose, capillary     Status: None   Collection Time: 05/01/15  8:45 PM  Result Value Ref Range   Glucose-Capillary 79 65 - 99 mg/dL  Glucose, capillary     Status: None   Collection Time: 05/02/15  8:27 AM  Result Value Ref Range   Glucose-Capillary 68 65 - 99 mg/dL   Comment 1 Notify RN   Glucose, capillary     Status: None   Collection Time: 05/02/15 11:31 AM  Result Value Ref Range   Glucose-Capillary 93 65 - 99 mg/dL   Comment 1 Notify RN     Imaging / Studies: Dg Abd Portable 1v  05/01/2015  CLINICAL DATA:  Constipation. Ileus. Status post colostomy reversal. EXAM: PORTABLE ABDOMEN - 1 VIEW COMPARISON:  Enema of 02/15/2015 FINDINGS: Two supine views. Gaseous distension of small bowel loops. Example loop measures 4.8 cm. No Victormanuel Mclure free  intraperitoneal air. Paucity of colonic gas. Midline laparotomy. There is rectal gas. IMPRESSION: Moderate small bowel distension with relative paucity of colonic gas. Partial small bowel obstruction versus adynamic ileus. No Aubreanna Percle free intraperitoneal air, given limitation of supine exam. Electronically Signed   By: Abigail Miyamoto M.D.   On: 05/01/2015 13:32    Medications / Allergies: per chart  Antibiotics: Anti-infectives    Start     Dose/Rate Route Frequency Ordered Stop   04/27/15 2000  cefoTEtan (CEFOTAN) 2 g in dextrose 5 % 50 mL IVPB     2 g 100 mL/hr over 30 Minutes Intravenous Every 12 hours 04/27/15 1446 04/27/15 2210   04/27/15 0656  cefoTEtan in Dextrose 5% (CEFOTAN) 2-2.08 GM-% IVPB  Comments:  Leandrew Koyanagi   : cabinet override      04/27/15 0656 04/27/15 1914   04/27/15 0654  cefoTEtan (CEFOTAN) 2 g in dextrose 5 % 50 mL IVPB     2 g 100 mL/hr over 30 Minutes Intravenous On call to O.R. 04/27/15 0654 04/27/15 0755        Note: Portions of this report may have been transcribed using voice recognition software. Every effort was made to ensure accuracy; however, inadvertent computerized transcription errors may be present.   Any transcriptional errors that result from this process are unintentional.     Adin Hector, M.D., F.A.C.S. Gastrointestinal and Minimally Invasive Surgery [facility] [address] [place], [place] [address] [phone] Main / Paging   05/02/2015  CARE TEAM:  PCP: Irven Shelling, MD  Outpatient Care Team: Patient Care Team: Lavone Orn, MD as PCP - General (Internal Medicine)  Inpatient Treatment Team: Treatment Team: Attending Provider: Fanny Skates, MD; Registered Nurse: Claiborne Billings, RN; Technician: Lindajo Royal, NT; Consulting Physician: Ralene Ok, MD; Technician: Francene Finders, NT; Respiratory Therapist: Abundio Miu, RRT; Technician: Wyline Copas, NT;  Registered Nurse: Suzan Nailer, RN

## 2015-05-02 NOTE — Progress Notes (Signed)
Hypoglycemic Event  CBG: 68  Treatment: 15 GM carbohydrate snack  Symptoms: None  Follow-up CBG: Time:1131 CBG Result: 93  Possible Reasons for Event: Inadequate meal intake  Comments/MD notified:    Claude MangesCross, Burdett Pinzon S

## 2015-05-03 LAB — GLUCOSE, CAPILLARY: Glucose-Capillary: 96 mg/dL (ref 65–99)

## 2015-05-03 MED ORDER — OXYCODONE HCL 5 MG PO TABS: 5.0000 mg | ORAL_TABLET | ORAL | Status: DC | PRN

## 2015-05-03 NOTE — Progress Notes (Signed)
Discussed discharge summary with patient. Reviewed all medications with patient. Patient received Rx. Patient ready for discharge. 

## 2015-05-03 NOTE — Discharge Instructions (Signed)
End Colostomy Reversal, Care After °Refer to this sheet in the next few weeks. These instructions provide you with information on caring for yourself after your procedure. Your health care provider may also give you more specific instructions. Your treatment has been planned according to current medical practices, but problems sometimes occur. Call your health care provider if you have any problems or questions after your procedure. °HOME CARE INSTRUCTIONS °· Change your bandages (dressings) as directed by your health care provider. °· Keep the wound clean and dry. The wound may be washed gently with soap and water. Do not rub the wound. Gently pat the wound dry with a clean towel. °· Do not take baths, swim, or use hot tubs for 10 days, or as directed by your health care provider. °· Only take over-the-counter or prescription medicines for pain, discomfort, or fever as directed by your health care provider. °· You may resume a normal diet as directed by your health care provider. °· Do not lift more than 10 pounds (4.5 kg) or play contact sports for 4 weeks, or as directed by your health care provider. °· Use a stool softener if recommended by your health care provider, especially with pain medicine. °SEEK MEDICAL CARE IF: °· You have redness, swelling, or increasing pain in the wound. °· You notice pus coming from the wound. °· You have drainage from the wound lasting longer than 1 day. °· You notice a bad smell coming from the wound or dressing. °· Your wound breaks open. °· You have persistent nausea or vomiting. °· You cannot have a bowel movement. °· You have pain that is not controlled with medicine. °SEEK IMMEDIATE MEDICAL CARE IF: °· You have a fever. °· You have a rash. °· You have difficulty breathing. °· You have any reaction or side effects to your medicines. °  °This information is not intended to replace advice given to you by your health care provider. Make sure you discuss any questions you have  with your health care provider. °  °Document Released: 09/25/2011 Document Revised: 11/17/2014 Document Reviewed: 09/25/2011 °Elsevier Interactive Patient Education ©2016 [employer] ° °

## 2015-05-03 NOTE — Discharge Summary (Signed)
Physician Discharge Summary  Patient ID: Verda Cuminsnthony D Andrus MRN: [id number] DOB/AGE: 58/03/1956 58 y.o.  Admit date: 04/27/2015 Discharge date: 05/03/2015  Admission Diagnoses: Status post colostomy takedown  Discharge Diagnoses:  Principal Problem:   Diverticulitis of intestine with abscess without bleeding Active Problems:   Diverticulitis large intestine   Discharged Condition: good  Hospital Course: the patient is a 59 year old male who was admitted status post open colostomy takedown. Patient was admitted to the for routinely postoperative. Please see op note for full details.  Patient was encouraged to ambulate and did well postoperatively. Pain control was adequate. Patient had no fevers postop. Patient was slowly advanced from a clear liquid diet to a regular diet patient was able to tolerate well. The patient was continued on home medications, blood sugars were well controlled.  Patient had good bowel function, was afebrile, was deemed stable for discharge and discharged home.  Consults: None  Significant Diagnostic StudieNone Treatments: surgery: As above  Discharge Exam: Blood pressure 172/71, pulse 89, temperature 98.6 F (37 C), temperature source Oral, resp. rate 18, height 6\' 1"  (1.854 m), weight 113.853 kg (251 lb), SpO2 99 %. General appearance: alert and cooperative GI: Wounds clean dry and intact, soft, staples in place.  Disposition: 01-Home or Self Care  Discharge Instructions    Diet - low sodium heart healthy    Complete by:  As directed      Increase activity slowly    Complete by:  As directed             Medication List    TAKE these medications        aspirin 81 MG tablet  Take 81 mg by mouth daily.     docusate sodium 100 MG capsule  Commonly known as:  COLACE  Take 200 mg by mouth daily.     glimepiride 4 MG tablet  Commonly known as:  AMARYL  Take 4 mg by mouth daily before breakfast.     metFORMIN 500 MG tablet  Commonly known  as:  GLUCOPHAGE  Take 500 mg by mouth 2 (two) times daily with a meal.     multivitamin tablet  Take 1 tablet by mouth daily.     oxyCODONE 5 MG immediate release tablet  Commonly known as:  Oxy IR/ROXICODONE  Take 1-2 tablets (5-10 mg total) by mouth every 4 (four) hours as needed for moderate pain, severe pain or breakthrough pain.     polyethylene glycol packet  Commonly known as:  MIRALAX / GLYCOLAX  Take 17 g by mouth daily.         Signed: Marigene EhlersRamirez Jr., Newell Wafer 05/03/2015, 8:43 AM

## 2015-05-03 NOTE — Progress Notes (Signed)
Utilization review completed.  

## 2015-06-02 ENCOUNTER — Ambulatory Visit (HOSPITAL_COMMUNITY)
Admission: RE | Admit: 2015-06-02 | Discharge: 2015-06-02 | Disposition: A | Discharge status: Home / Self Care | Payer: 59 | Source: Ambulatory Visit | Attending: General Surgery | Admitting: General Surgery

## 2015-06-02 ENCOUNTER — Other Ambulatory Visit: Payer: Self-pay | Admitting: General Surgery

## 2015-06-02 DIAGNOSIS — R0602 Shortness of breath: Secondary | ICD-10-CM | POA: Insufficient documentation

## 2015-06-02 NOTE — Progress Notes (Addendum)
*  Preliminary Results* Bilateral lower extremity venous duplex completed. Bilateral lower extremities are negative for deep vein thrombosis. There is no evidence of Baker's cyst bilaterally.  Attempted to call preliminary results to [phone], however the office was closed. The patient will be discharged and can be reached by phone if needed.  06/02/2015  Gertie FeyMichelle Leigh Kaeding, RVT, RDCS, RDMS

## 2016-03-05 ENCOUNTER — Ambulatory Visit (INDEPENDENT_AMBULATORY_CARE_PROVIDER_SITE_OTHER): Payer: 59

## 2016-03-05 ENCOUNTER — Encounter (HOSPITAL_COMMUNITY): Payer: Self-pay | Admitting: Family Medicine

## 2016-03-05 ENCOUNTER — Ambulatory Visit (HOSPITAL_COMMUNITY)
Admission: EM | Admit: 2016-03-05 | Discharge: 2016-03-05 | Disposition: A | Discharge status: Home / Self Care | Payer: 59 | Attending: Family Medicine | Admitting: Family Medicine

## 2016-03-05 DIAGNOSIS — M25511 Pain in right shoulder: Secondary | ICD-10-CM | POA: Diagnosis not present

## 2016-03-05 MED ORDER — PREDNISONE 20 MG PO TABS: ORAL_TABLET | ORAL | 0 refills | Status: DC

## 2016-03-05 MED ORDER — OXYCODONE-ACETAMINOPHEN 5-325 MG PO TABS: 1.0000 | ORAL_TABLET | Freq: Four times a day (QID) | ORAL | 0 refills | Status: DC | PRN

## 2016-03-05 NOTE — Discharge Instructions (Signed)
You appear to have an acute arthritis in the right shoulder. This should respond to the prednisone and in the meantime the Percocet should get some pain relief.

## 2016-03-05 NOTE — ED Triage Notes (Signed)
Pt reports pain in his  r  Shoulder  For     Several  Days     Has  Been  Bothering  Him  For   sev months  Getting  Worse    Pt denys  Any  specfic  Injury pain is  Worse on  Movement     And posistion   He reports     The   Symptoms  Not  releived  By pain  Pills

## 2016-03-05 NOTE — ED Provider Notes (Addendum)
[facility]    CSN: [id number] Arrival date & time: 03/05/16  1518  First Provider Contact:  First MD Initiated Contact with Patient 03/05/16 1547        History   Chief Complaint Chief Complaint  Patient presents with  . Shoulder Pain    HPI Leroy Chen is a 60 y.o. male.   This a 60 year old man presented with right shoulder pain. He's had his right shoulder aching on a 1/10 scale for several weeks. He's had no fall or accident or heavy lifting. 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  On Friday his right shoulder began to hurt significantly more. His wife was out of town and so he was hurting so bad he did not even get dressed or drive. It's only when [place] that he was able to get dressed and come in to get evaluated.  Patient denies fever, other joint pain, prior shoulder pain, fall, heavy lifting, or other injury.      Past Medical History:  Diagnosis Date  . Atrial fibrillation [facility]) March 2015  . Diabetes mellitus without complication (HCC)    NIDDM, type II  . High cholesterol   . Hydradenitis   . Sebaceous cyst    hx of  . Sigmoid diverticulitis   . Wears glasses     Patient Active Problem List   Diagnosis Date Noted  . Diverticulitis large intestine 04/27/2015  . Atrial fibrillation with RVR (HCC) 10/17/2014  . Smoker 10/17/2014  . Demand ischemia from AF with RVR-Troponin 0.07 10/17/2014  . Diverticulitis of large intestine with abscess without bleeding 10/15/2014  . Diverticulitis of intestine with abscess without bleeding 10/12/2014  . Colonic diverticular abscess- s/p colectomy 10/15/14 10/12/2014  . Folliculitis 10/12/2014  . Sepsis (HCC) 10/12/2014  . Diabetes mellitus, type 2 (HCC) 10/12/2014  . Hidradenitis suppurativa 08/02/2012    Past Surgical History:  Procedure Laterality Date  . COLECTOMY  10/15/14  . hand sugery    . ILEO  LOOP COLOSTOMY CLOSURE N/A 04/27/2015   Procedure: DIAGNOSTIC LAPAROSCOPY,  EXPLORATORY LAPAROTOMY WITH RESECTION AND CLOSURE OF COLOSTOMY ;  Surgeon: [name], MD;  [facility];  Service: General;  Laterality: N/A;  . LAPAROTOMY N/A 10/15/2014   Procedure: EXPLORATORY LAPAROTOMY WITH DRAINAGE OF INTRA- ABDOMINAL ABSCESS;  Surgeon: [name], MD;  Location: [facility];  Service: General;  Laterality: N/A;  . ORCHIECTOMY    . PARTIAL COLECTOMY N/A 10/15/2014   Procedure: SIGMOID COLECTOMY WITH COLOSTOMY CREATION;  Surgeon: [name], MD;  [facility];  Service: General;  Laterality: N/A;  . PILONIDAL CYST EXCISION N/A 01/01/2015   Procedure: EXCISION OF PILONIDAL DISEASE;  Surgeon: Romie Levee, MD;  Location: [facility];  Service: General;  Laterality: N/A;       Home Medications    Prior to Admission medications   Medication Sig Start Date End Date Taking? Authorizing Provider  aspirin 81 MG tablet Take 81 mg by mouth daily.    Historical Provider, MD  docusate sodium (COLACE) 100 MG capsule Take 200 mg by mouth daily.    Historical Provider, MD  glimepiride (AMARYL) 4 MG tablet Take 4 mg by mouth daily before breakfast.  06/27/12   Historical Provider, MD  metFORMIN (GLUCOPHAGE) 500 MG tablet Take 500 mg by mouth 2 (two) times daily with a meal.    Historical Provider, MD  Multiple Vitamin (  MULTIVITAMIN) tablet Take 1 tablet by mouth daily.    Historical Provider, MD  oxyCODONE-acetaminophen (PERCOCET/ROXICET) 5-325 MG tablet Take 1 tablet by mouth every 6 (six) hours as needed for severe pain. 03/05/16   Elvina SidleKurt Loreli Debruler, MD  polyethylene glycol [facility](MIRALAX / Ethelene HalGLYCOLAX) packet Take 17 g by mouth daily.    Historical Provider, MD  predniSONE (DELTASONE) 20 MG tablet Two daily with food 03/05/16   Elvina SidleKurt Kenly Xiao, MD    Family History History reviewed. No pertinent family history.  Social History Social History  Substance Use Topics  . Smoking status:  Current Every Day Smoker    Packs/day: 0.25    Years: 20.00    Types: Cigarettes  . Smokeless tobacco: Never Used  . Alcohol use Yes     Comment: Social- moderate - one beer at the wedding       Allergies   Penicillins   Review of Systems Review of Systems   Physical Exam Triage Vital Signs ED Triage Vitals  Enc Vitals Group     BP      Pulse      Resp      Temp      Temp src      SpO2      Weight      Height      Head Circumference      Peak Flow      Pain Score      Pain Loc      Pain Edu?      Excl. in GC?    No data found.   Updated Vital Signs BP 142/86 (BP Location: Left Arm)   Pulse 82   Temp 98.6 F (37 C) (Oral)   Resp 18   SpO2 100%   Visual Acuity Right Eye Distance:   Left Eye Distance:   Bilateral Distance:    Right Eye Near:   Left Eye Near:    Bilateral Near:     Physical Exam  Constitutional: He appears well-developed and well-nourished.  HENT:  Head: Normocephalic.  Eyes: Conjunctivae are normal.  Neck: Normal range of motion. Neck supple. No tracheal deviation present. No thyromegaly present.  Cardiovascular: Normal rate.   Pulmonary/Chest: Effort normal.  Musculoskeletal: He exhibits tenderness.  Patient is quite tender in his right shoulder, particularly right anterior joint line. He is unable to move his upper arm in any significant direction.  Nursing note and vitals reviewed.    UC Treatments / Results  Labs (all labs ordered are listed, but only abnormal results are displayed) Labs Reviewed - No data to display  EKG  EKG Interpretation None       Radiology No results found. No obvious bony abnormality Procedures Procedures (including critical care time)  Medications Ordered in UC Medications - No data to display   Initial Impression / Assessment and Plan / UC Course  I have reviewed the triage vital signs and the nursing notes.  Pertinent labs & imaging results that were available during my care of  the patient were reviewed by me and considered in my medical decision making (see chart for details).  Clinical Course      Final Clinical Impressions(s) / UC Diagnoses   Final diagnoses:  Shoulder pain, right  Patient appears to have acute arthritis in his right shoulder.w Prescriptions New Prescriptions   OXYCODONE-ACETAMINOPHEN (PERCOCET/ROXICET) 5-325 MG TABLET    Take 1 tablet by mouth every 6 (six) hours as needed for severe pain.   PREDNISONE (  DELTASONE) 20 MG TABLET    Two daily with food     Elvina SidleKurt Zahari Fazzino, MD 03/05/16 1625    Elvina SidleKurt Leontyne Manville, MD 03/05/16 2030

## 2016-07-03 IMAGING — CR DG ABD PORTABLE 1V
2 series · 2 of 2 positions shown · non-contrast
Comparison: Enema of 02/15/2015

CLINICAL DATA: Constipation. Ileus. Status post colostomy reversal.

EXAM:
PORTABLE ABDOMEN - 1 VIEW

[AP (1 of 2)]
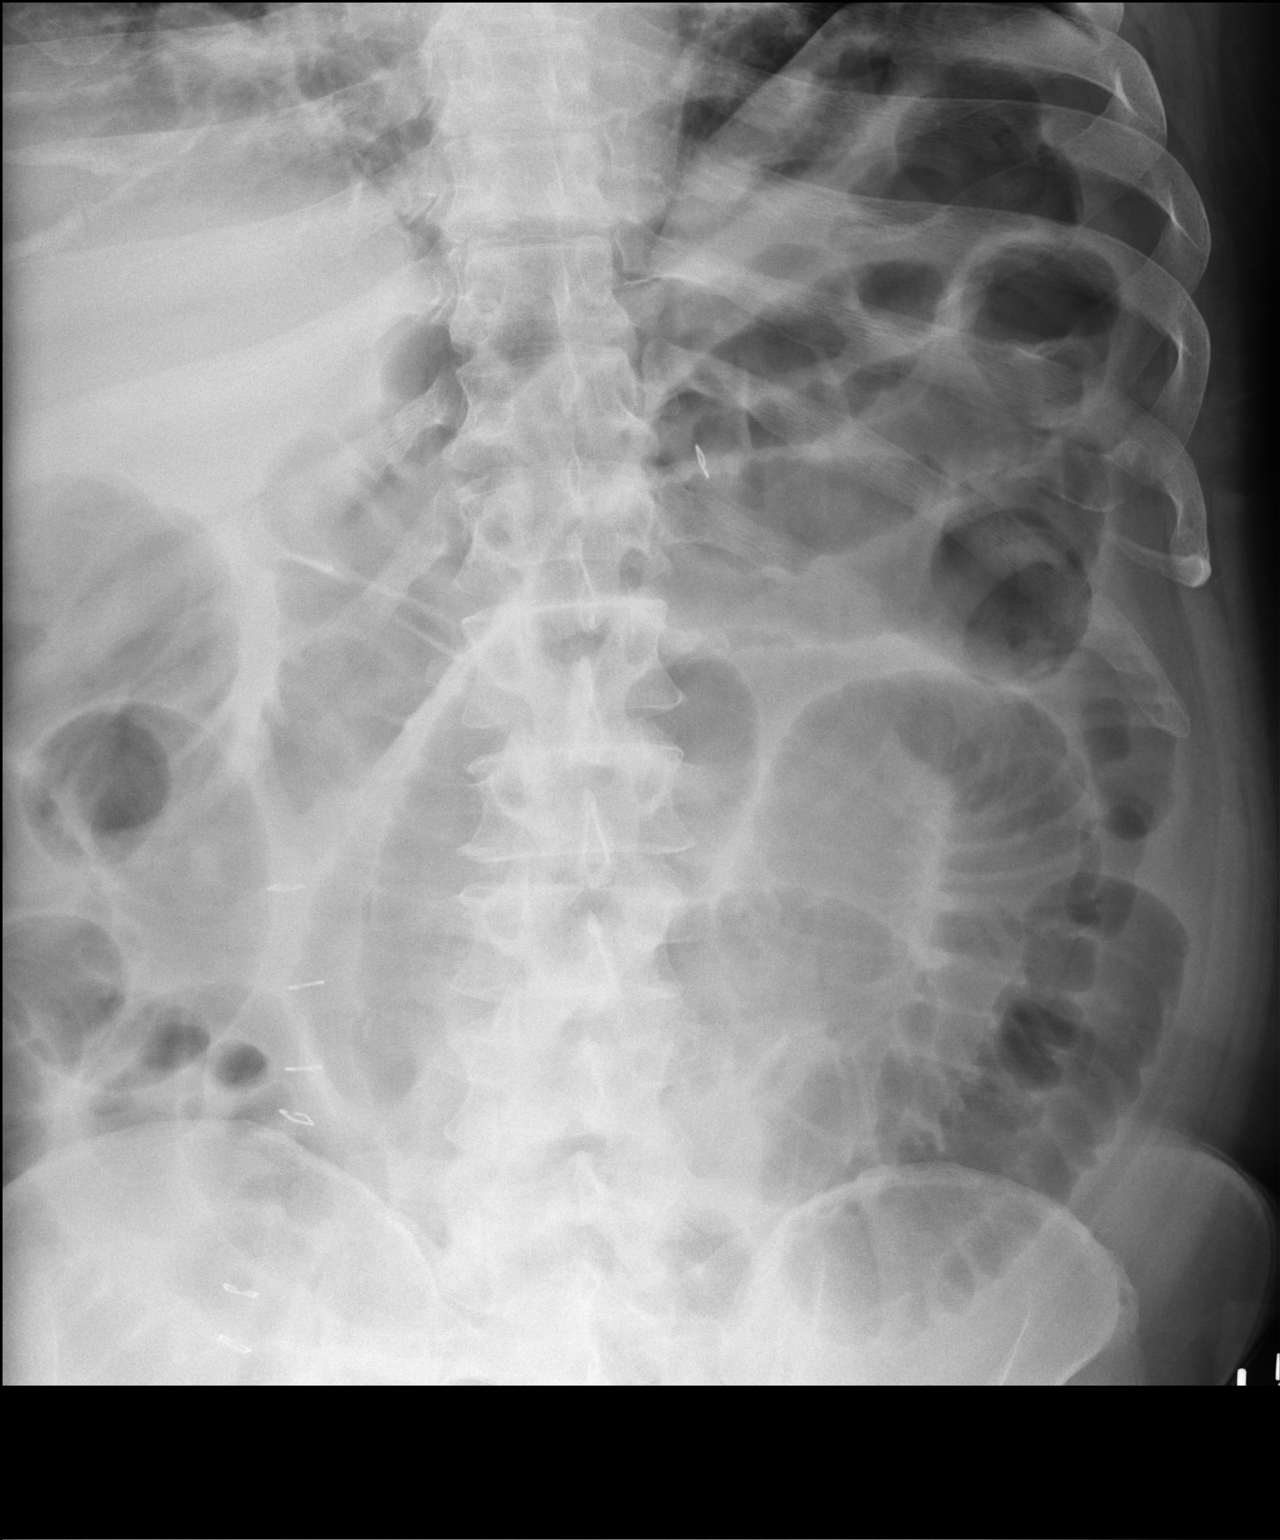

[AP (2 of 2)]
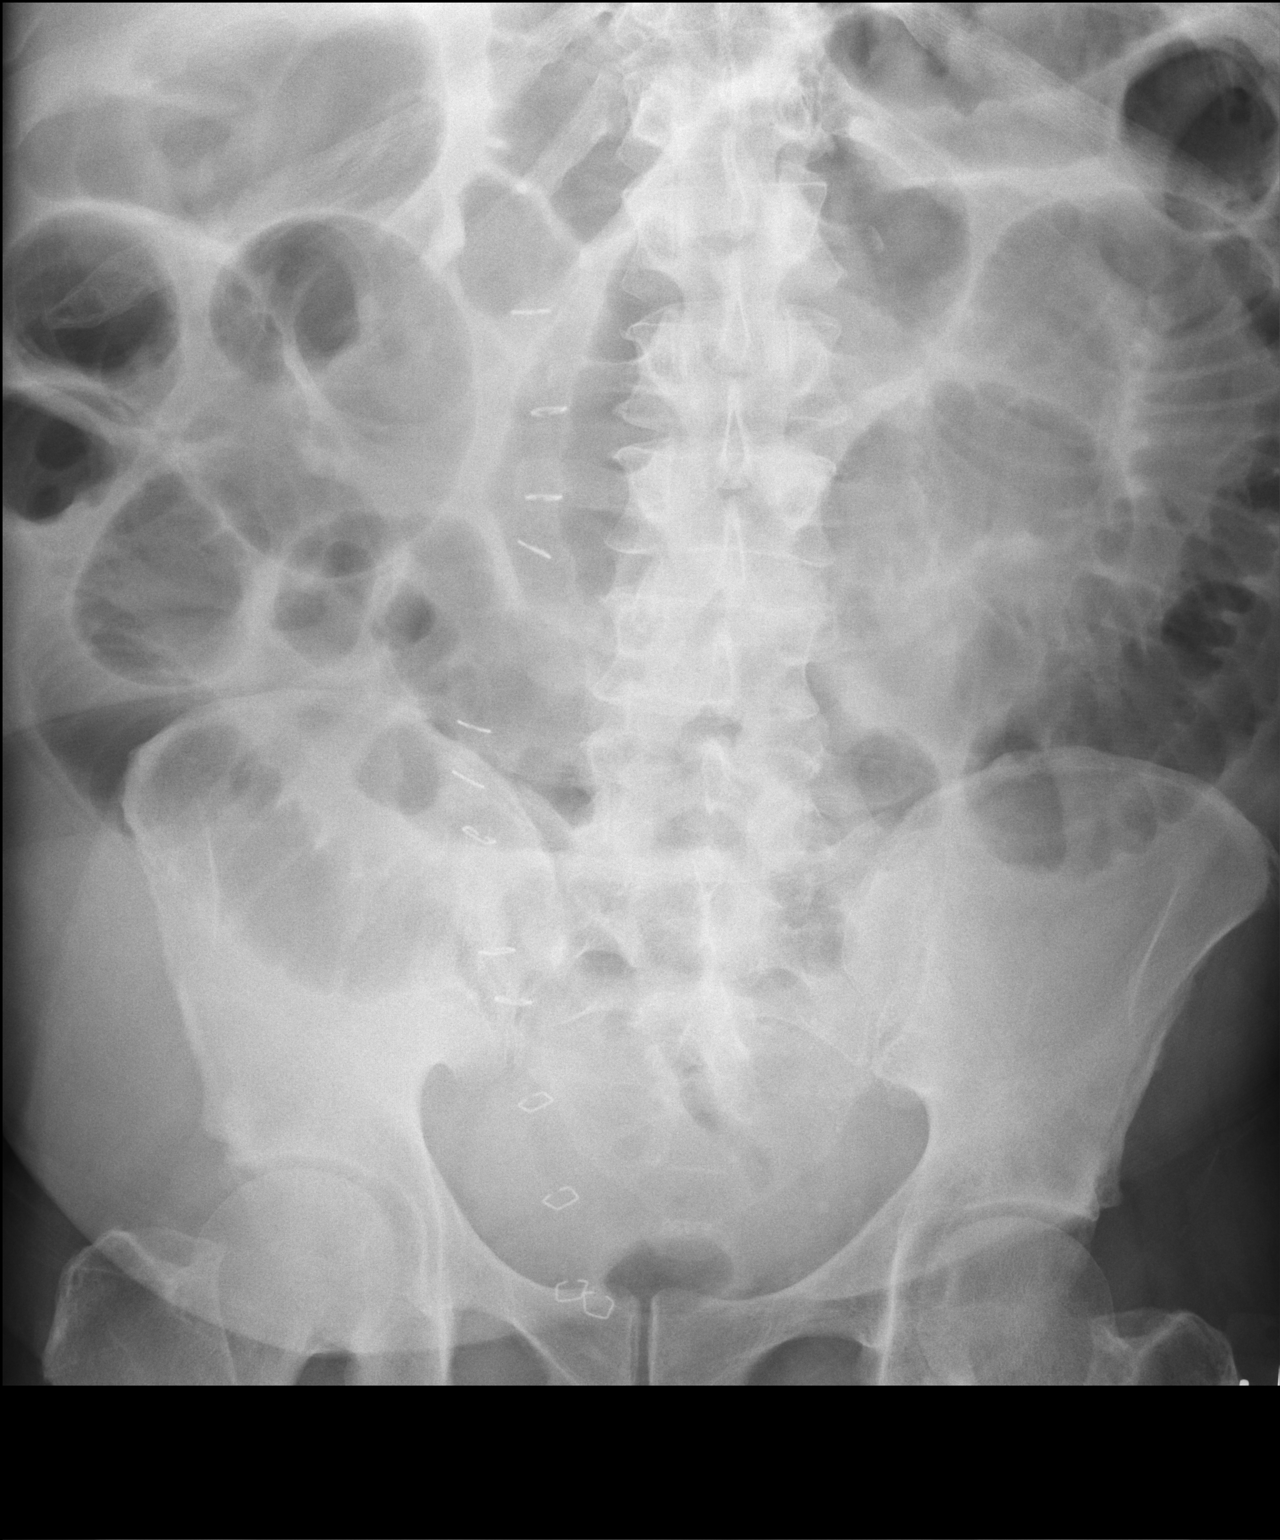

[2 of 2 positions shown; findings below may reference images not displayed]

FINDINGS: Two supine views. Gaseous distension of small bowel loops. Example
loop measures 4.8 cm. No gross free intraperitoneal air. Paucity of
colonic gas. Midline laparotomy. There is rectal gas.
IMPRESSION: Moderate small bowel distension with relative paucity of colonic
gas. Partial small bowel obstruction versus adynamic ileus. No gross
free intraperitoneal air, given limitation of supine exam.

## 2017-08-05 ENCOUNTER — Encounter (HOSPITAL_COMMUNITY): Payer: Self-pay | Admitting: Family Medicine

## 2017-08-05 ENCOUNTER — Ambulatory Visit (HOSPITAL_COMMUNITY)
Admission: EM | Admit: 2017-08-05 | Discharge: 2017-08-05 | Disposition: A | Discharge status: Home / Self Care | Payer: 59

## 2017-08-05 DIAGNOSIS — R05 Cough: Secondary | ICD-10-CM

## 2017-08-05 DIAGNOSIS — R059 Cough, unspecified: Secondary | ICD-10-CM

## 2017-08-05 DIAGNOSIS — J189 Pneumonia, unspecified organism: Secondary | ICD-10-CM

## 2017-08-05 DIAGNOSIS — R0789 Other chest pain: Secondary | ICD-10-CM

## 2017-08-05 MED ORDER — ALBUTEROL SULFATE HFA 108 (90 BASE) MCG/ACT IN AERS: 1.0000 | INHALATION_SPRAY | Freq: Four times a day (QID) | RESPIRATORY_TRACT | 0 refills | Status: AC | PRN

## 2017-08-05 MED ORDER — AZITHROMYCIN 250 MG PO TABS: 250.0000 mg | ORAL_TABLET | Freq: Every day | ORAL | 0 refills | Status: DC

## 2017-08-05 MED ORDER — HYDROCODONE-HOMATROPINE 5-1.5 MG/5ML PO SYRP: 5.0000 mL | ORAL_SOLUTION | Freq: Every evening | ORAL | 0 refills | Status: DC | PRN

## 2017-08-05 MED ORDER — BENZONATATE 100 MG PO CAPS: 100.0000 mg | ORAL_CAPSULE | Freq: Three times a day (TID) | ORAL | 0 refills | Status: DC | PRN

## 2017-08-05 NOTE — Discharge Instructions (Signed)
For sore throat try using a honey-based tea. Use 3 teaspoons of honey with juice squeezed from half lemon. Place shaved pieces of ginger into 1/2-1 cup of water and warm over stove top. Then mix the ingredients and repeat every 4 hours as needed. 

## 2017-08-05 NOTE — ED Provider Notes (Signed)
  MRN: [id number] DOB: 10/16/1955  Subjective:   Leroy Chen is a 62 y.o. male presenting for 1 week history of worsening productive cough that elicits chest pain, shob, back pain. Has also had stuffy nose, chest congestion, nasal congestion. Has tried Mucinex, honey tea. Denies fever, sore throat, sinus pain, ear pain, n/v, abdominal pain. Denies history of allergies, asthma. Denies smoking cigarettes.  Leroy Chen is allergic to penicillins.  Leroy Chen  has a past medical history of Atrial fibrillation [facility](HCC) (March 2015), Diabetes mellitus without complication (HCC), High cholesterol, Hydradenitis, Sebaceous cyst, Sigmoid diverticulitis, and Wears glasses. Also  has a past surgical history that includes hand sugery; Orchiectomy; Colectomy (10/15/14); laparotomy (N/A, 10/15/2014); Partial colectomy (N/A, 10/15/2014); Pilonidal cyst excision (N/A, 01/01/2015); and Ileo loop colostomy closure (N/A, 04/27/2015).  Objective:   Vitals: BP 136/76 (BP Location: Left Arm)   Pulse (!) 106 Comment: Reported HR to nurse Traci Bast  Temp 98.6 F (37 C) (Oral)   Resp 18   SpO2 94% Comment: Reported oxygen level to nurse Dahlia Byesraci Bast  Physical Exam  Constitutional: He is oriented to person, place, and time. He appears well-developed and well-nourished.  HENT:  Mouth/Throat: Oropharynx is clear and moist.  Eyes: No scleral icterus.  Cardiovascular: Normal rate, regular rhythm and intact distal pulses. Exam reveals no gallop and no friction rub.  No murmur heard. Pulmonary/Chest: No respiratory distress. He has no wheezes. He has rales (left mid-lower lung bases).  Neurological: He is alert and oriented to person, place, and time.  Skin: Skin is warm and dry.  Psychiatric: He has a normal mood and affect.   Assessment and Plan :   Community acquired pneumonia of left lung, unspecified part of lung  Cough  Atypical chest pain  Will manage for CAP with azithromycin, cough suppression and albuterol  inhaler. Return-to-clinic precautions discussed, patient verbalized understanding.     Wallis BambergMani, Shelley Cocke, PA-C 08/05/17 1235

## 2017-08-05 NOTE — ED Triage Notes (Signed)
Pt here for cough, congestion with brown mucous x 1 week. Taking mucinex. sts that he has been waking up at 3 am coughing severely. sts also back pain, cold chills.

## 2018-05-02 ENCOUNTER — Other Ambulatory Visit: Payer: Self-pay

## 2018-05-02 ENCOUNTER — Encounter (HOSPITAL_COMMUNITY): Payer: Self-pay | Admitting: Emergency Medicine

## 2018-05-02 ENCOUNTER — Ambulatory Visit (HOSPITAL_COMMUNITY)
Admission: EM | Admit: 2018-05-02 | Discharge: 2018-05-02 | Disposition: A | Discharge status: Home / Self Care | Payer: 59 | Attending: Family Medicine | Admitting: Family Medicine

## 2018-05-02 DIAGNOSIS — R1032 Left lower quadrant pain: Secondary | ICD-10-CM

## 2018-05-02 MED ORDER — CIPROFLOXACIN HCL 500 MG PO TABS: 500.0000 mg | ORAL_TABLET | Freq: Two times a day (BID) | ORAL | 0 refills | Status: DC

## 2018-05-02 MED ORDER — METRONIDAZOLE 500 MG PO TABS: 500.0000 mg | ORAL_TABLET | Freq: Three times a day (TID) | ORAL | 0 refills | Status: DC

## 2018-05-02 NOTE — ED Notes (Signed)
Spoke with natalie, np about patient.  Dorene Grebe, np spoke with patient

## 2018-05-02 NOTE — Discharge Instructions (Signed)
You have been seen today for abdominal pain. Your evaluation was not suggestive of any emergent condition requiring medical intervention at this time. However, some abdominal problems make take more time to appear. Therefore, it is important for you to watch for any new symptoms or worsening of your current condition. Please return here or to the Emergency Department immediately should you feel worse in any way or have any of the following symptoms: increasing or different abdominal pain, persistent vomiting, fevers, or shaking chills.  Return here or to the Emergency Department if your pain does not begin to improve after starting the antibiotics prescribed.

## 2018-05-02 NOTE — ED Triage Notes (Addendum)
Lower left abdominal pain, radiating into the back for 2 weeks.  Over the past week, pain has been daily. patient has had diarrhea, constipation.  Patient has had diverticulitis, blockage and had a portion of intestines removed.  .  Patient has had a colostomy, then it was reversed-nov 2016.    Today noticed blood in stool today.    Patient did slip and fall last night.  Landed on left hip

## 2018-05-07 NOTE — ED Provider Notes (Signed)
[facility]   [id number] 05/02/18 Arrival Time: 1602  ASSESSMENT & PLAN:  1. Left lower quadrant abdominal pain   With h/o diverticulitis and feeling these are the same symptoms as in the past, decision made for empiric treatment. No need for emergent imaging at this time with overall benign exam.  Meds ordered this encounter  Medications  . ciprofloxacin (CIPRO) 500 MG tablet    Sig: Take 1 tablet (500 mg total) by mouth 2 (two) times daily.    Dispense:  20 tablet    Refill:  0  . metroNIDAZOLE (FLAGYL) 500 MG tablet    Sig: Take 1 tablet (500 mg total) by mouth 3 (three) times daily.    Dispense:  30 tablet    Refill:  0     Discharge Instructions     You have been seen today for abdominal pain. Your evaluation was not suggestive of any emergent condition requiring medical intervention at this time. However, some abdominal problems make take more time to appear. Therefore, it is important for you to watch for any new symptoms or worsening of your current condition. Please return here or to the Emergency Department immediately should you feel worse in any way or have any of the following symptoms: increasing or different abdominal pain, persistent vomiting, fevers, or shaking chills.  Return here or to the Emergency Department if your pain does not begin to improve after starting the antibiotics prescribed.   Follow-up Information    Schedule an appointment as soon as possible for a visit  with Kirby Funk, MD.   Specialty:  Internal Medicine Contact information: [address] [place] [address] [phone]          May f/u here as needed. Ensure adequate fluid intake.  Reviewed expectations re: course of current medical issues. Questions answered. Outlined signs and symptoms indicating need for more acute intervention. Patient verbalized understanding. After Visit Summary given.   SUBJECTIVE:  Leroy Chen is a 62 y.o. male  who presents with complaint of intermittent abdominal discomfort. Onset gradual, over the past 2 weeks. Location: mainly LLQ without radiation. Described as dull and and mild aching when present. Symptoms are stable since beginning. Aggravating factors: eating. Alleviating factors: none. Associated symptoms: mild and alternating loose stools and constipation. He denies chills, dysuria, fever, nausea and vomiting. Appetite: normal. PO intake: normal. Ambulatory without assistance. Urinary symptoms: none. Last bowel movement today with "a little red blood" on toilet paper. OTC treatment: none.  Past Surgical History:  Procedure Laterality Date  . COLECTOMY  10/15/14  . hand sugery    . ILEO LOOP COLOSTOMY CLOSURE N/A 04/27/2015   Procedure: DIAGNOSTIC LAPAROSCOPY,  EXPLORATORY LAPAROTOMY WITH RESECTION AND CLOSURE OF COLOSTOMY ;  Surgeon: [name], MD;  Location: [facility];  Service: General;  Laterality: N/A;  . LAPAROTOMY N/A 10/15/2014   Procedure: EXPLORATORY LAPAROTOMY WITH DRAINAGE OF INTRA- ABDOMINAL ABSCESS;  Surgeon: [name], MD;  Location: [facility];  Service: General;  Laterality: N/A;  . ORCHIECTOMY    . PARTIAL COLECTOMY N/A 10/15/2014   Procedure: SIGMOID COLECTOMY WITH COLOSTOMY CREATION;  Surgeon: [name], MD;  Location: [facility];  Service: General;  Laterality: N/A;  . PILONIDAL CYST EXCISION N/A 01/01/2015   Procedure: EXCISION OF PILONIDAL DISEASE;  Surgeon: Romie Levee, MD;  Location: [facility];  Service: General;  Laterality: N/A;   ROS: As per HPI. All other systems negative.  OBJECTIVE:  Vitals:  05/02/18 1635  BP: 123/70  Pulse: 95  Resp: 20  Temp: 98.2 F (36.8 C)  TempSrc: Oral  SpO2: 98%    General appearance: alert; no distress Neck: FROM; supple Lungs: clear to auscultation bilaterally Heart: regular rate and rhythm Abdomen: soft; non-distended; mild LLQ tenderness; bowel sounds present; no masses or organomegaly; no guarding or  rebound tenderness Rectal: declines Back: no CVA tenderness; FROM at hips Extremities: no edema; symmetrical with no gross deformities Skin: warm and dry Neurologic: normal gait Psychological: alert and cooperative; normal mood and affect   Allergies  Allergen Reactions  . Penicillins Swelling                                               Past Medical History:  Diagnosis Date  . Atrial fibrillation [facility]) March 2015  . Diabetes mellitus without complication (HCC)    NIDDM, type II  . High cholesterol   . Hydradenitis   . Sebaceous cyst    hx of  . Sigmoid diverticulitis   . Wears glasses    Social History   Socioeconomic History  . Marital status: Married    Spouse name: Not on file  . Number of children: Not on file  . Years of education: Not on file  . Highest education level: Not on file  Occupational History  . Not on file  Social Needs  . Financial resource strain: Not on file  . Food insecurity:    Worry: Not on file    Inability: Not on file  . Transportation needs:    Medical: Not on file    Non-medical: Not on file  Tobacco Use  . Smoking status: Current Every Day Smoker    Packs/day: 0.25    Years: 20.00    Pack years: 5.00    Types: Cigarettes  . Smokeless tobacco: Never Used  Substance and Sexual Activity  . Alcohol use: Yes    Comment: Social- moderate - one beer at the wedding    . Drug use: No  . Sexual activity: Yes    Birth control/protection: Condom  Lifestyle  . Physical activity:    Days per week: Not on file    Minutes per session: Not on file  . Stress: Not on file  Relationships  . Social connections:    Talks on phone: Not on file    Gets together: Not on file    Attends religious service: Not on file    Active member of club or organization: Not on file    Attends meetings of clubs or organizations: Not on file    Relationship status: Not on file  . Intimate partner violence:    Fear of current or ex partner: Not on file      Emotionally abused: Not on file    Physically abused: Not on file    Forced sexual activity: Not on file  Other Topics Concern  . Not on file  Social History Narrative  . Not on file   FH: Question HTN. No known colon disease reported.   Mardella Layman, MD 05/07/18 [phone]

## 2018-09-25 ENCOUNTER — Encounter (HOSPITAL_COMMUNITY): Payer: Self-pay | Admitting: Emergency Medicine

## 2018-09-25 ENCOUNTER — Ambulatory Visit (HOSPITAL_COMMUNITY)
Admission: EM | Admit: 2018-09-25 | Discharge: 2018-09-25 | Disposition: A | Discharge status: Home / Self Care | Payer: 59 | Attending: Family Medicine | Admitting: Family Medicine

## 2018-09-25 DIAGNOSIS — R1032 Left lower quadrant pain: Secondary | ICD-10-CM | POA: Diagnosis not present

## 2018-09-25 DIAGNOSIS — R197 Diarrhea, unspecified: Secondary | ICD-10-CM

## 2018-09-25 NOTE — ED Triage Notes (Signed)
Pt states he has a cyst on the outer surface of his abdomen, states he went to the [place] given antibiotics and had a culture done but its still draining. Draining x3 weeks. Hx of colon surgery and diverticulitis.

## 2018-09-25 NOTE — Discharge Instructions (Signed)
Recommending further evaluation and management in the ED for abdominal pain and frequent watery/loose stools x 2 weeks.  Current antibiotic use.  Hx significant for diverticulitis with abscess formation and colon resection.  Pt aware and in agreement with plan.  Will go to ED with wife by private vehicle.

## 2018-09-25 NOTE — ED Provider Notes (Signed)
[facility] CARE CENTER   [id number] 09/25/18 Arrival Time: 1215  CC: ABDOMINAL pain  SUBJECTIVE:  Leroy Chen is a 63 y.o. male hx significant for atrial fibrillation, diverticulitis with abscess formation and colon resection, DM (poorly controlled per last PCP note), and HLD, who presents with complaint of abdominal pain and frequent episodes of watery/ loose stools x5/per day for the past two weeks.  Pt also notes cyst on abdomen that is still draining x 3 weeks.  Currently being treated with antibiotics for cyst.   Localizes pain to LLQ.  Describes pain as intermittent and sharp.  Has not tried OTC medications. Reports improvement with having a bowel movement.  Reports similar symptoms in October of last year that temporarily improved with antibiotics.  Was treated for diverticulitis flare at that time.  Last BM today with diarrhea.  Reports sweats, subjective fever, chills, decreased appetite, and nausea.    Denies nausea, vomiting, chest pain, SOB, constipation, hematochezia, melena, dysuria, difficulty urinating, increased frequency or urgency, flank pain, loss of bowel or bladder function.  No LMP for male patient.  ROS: As per HPI.  Past Medical History:  Diagnosis Date  . Atrial fibrillation [facility]) March 2015  . Diabetes mellitus without complication (HCC)    NIDDM, type II  . High cholesterol   . Hydradenitis   . Sebaceous cyst    hx of  . Sigmoid diverticulitis   . Wears glasses    Past Surgical History:  Procedure Laterality Date  . COLECTOMY  10/15/14  . hand sugery    . ILEO LOOP COLOSTOMY CLOSURE N/A 04/27/2015   Procedure: DIAGNOSTIC LAPAROSCOPY,  EXPLORATORY LAPAROTOMY WITH RESECTION AND CLOSURE OF COLOSTOMY ;  Surgeon: [name], MD;  Location: [facility] OR;  Service: General;  Laterality: N/A;  . LAPAROTOMY N/A 10/15/2014   Procedure: EXPLORATORY LAPAROTOMY WITH DRAINAGE OF INTRA- ABDOMINAL ABSCESS;  Surgeon: [name], MD;  Location: [facility] OR;  Service:  General;  Laterality: N/A;  . ORCHIECTOMY    . PARTIAL COLECTOMY N/A 10/15/2014   Procedure: SIGMOID COLECTOMY WITH COLOSTOMY CREATION;  Surgeon: [name], MD;  Location: [facility] OR;  Service: General;  Laterality: N/A;  . PILONIDAL CYST EXCISION N/A 01/01/2015   Procedure: EXCISION OF PILONIDAL DISEASE;  Surgeon: Romie Levee, MD;  Location: [facility];  Service: General;  Laterality: N/A;   Allergies  Allergen Reactions  . Penicillins Swelling   No current facility-administered medications on file prior to encounter.    Current Outpatient Medications on File Prior to Encounter  Medication Sig Dispense Refill  . albuterol (PROVENTIL HFA;VENTOLIN HFA) 108 (90 Base) MCG/ACT inhaler Inhale 1-2 puffs into the lungs every 6 (six) hours as needed for wheezing or shortness of breath. 18 g 0  . aspirin 81 MG tablet Take 81 mg by mouth daily.    Marland Kitchen atorvastatin (LIPITOR) 10 MG tablet Take 10 mg by mouth daily.    Marland Kitchen docusate sodium (COLACE) 100 MG capsule Take 200 mg by mouth daily.    Marland Kitchen doxycycline (VIBRA-TABS) 100 MG tablet Take 100 mg by mouth 2 (two) times daily.    . Dulaglutide (TRULICITY) 0.75 MG/0.5ML SOPN Inject into the skin.    Marland Kitchen empagliflozin (JARDIANCE) 10 MG TABS tablet Take 10 mg by mouth daily.    . insulin aspart (NOVOLOG) 100 UNIT/ML injection Inject into the skin 3 (three) times daily before meals.    Marland Kitchen losartan (COZAAR) 50 MG tablet Take 50 mg by mouth daily.    Marland Kitchen  metFORMIN (GLUCOPHAGE) 500 MG tablet Take 500 mg by mouth 2 (two) times daily with a meal.    . Metoprolol Succinate 25 MG CS24 Take by mouth.    . metroNIDAZOLE (FLAGYL) 500 MG tablet Take 1 tablet (500 mg total) by mouth 3 (three) times daily. 30 tablet 0  . Multiple Vitamin (MULTIVITAMIN) tablet Take 1 tablet by mouth daily.    . NON FORMULARY     . polyethylene glycol (MIRALAX / GLYCOLAX) packet Take 17 g by mouth daily.    . rivaroxaban (XARELTO) 20 MG TABS tablet Take 20 mg by mouth daily with  supper.     Social History   Socioeconomic History  . Marital status: Married    Spouse name: Not on file  . Number of children: Not on file  . Years of education: Not on file  . Highest education level: Not on file  Occupational History  . Not on file  Social Needs  . Financial resource strain: Not on file  . Food insecurity:    Worry: Not on file    Inability: Not on file  . Transportation needs:    Medical: Not on file    Non-medical: Not on file  Tobacco Use  . Smoking status: Current Every Day Smoker    Packs/day: 0.25    Years: 20.00    Pack years: 5.00    Types: Cigarettes  . Smokeless tobacco: Never Used  Substance and Sexual Activity  . Alcohol use: Yes    Comment: Social- moderate - one beer at the wedding    . Drug use: No  . Sexual activity: Yes    Birth control/protection: Condom  Lifestyle  . Physical activity:    Days per week: Not on file    Minutes per session: Not on file  . Stress: Not on file  Relationships  . Social connections:    Talks on phone: Not on file    Gets together: Not on file    Attends religious service: Not on file    Active member of club or organization: Not on file    Attends meetings of clubs or organizations: Not on file    Relationship status: Not on file  . Intimate partner violence:    Fear of current or ex partner: Not on file    Emotionally abused: Not on file    Physically abused: Not on file    Forced sexual activity: Not on file  Other Topics Concern  . Not on file  Social History Narrative  . Not on file   No family history on file.   OBJECTIVE:  Vitals:   09/25/18 1248  BP: 122/73  Pulse: 89  Resp: 18  Temp: 97.8 F (36.6 C)  SpO2: 100%    General appearance: Alert; NAD HEENT: NCAT.  Oropharynx clear.  Lungs: clear to auscultation bilaterally without adventitious breath sounds Heart: regular rate and rhythm.  Radial pulses 2+ symmetrical bilaterally Abdomen: soft, non-distended; normal active  bowel sounds; mildly TTP over LLQ; nontender at McBurney's point; negative Murphy's sign; negative rebound; no guarding Back: no CVA tenderness Extremities: no edema; symmetrical with no gross deformities Skin: warm and dry; healing cyst mid lower abdomen, NTTP, no active drainage, odor present Neurologic: normal gait Psychological: alert and cooperative; normal mood and affect   ASSESSMENT & PLAN:  1. Left lower quadrant abdominal pain   2. Watery diarrhea    Recommending further evaluation and management in the ED for abdominal pain  and frequent watery/loose stools x 2 weeks.  Current antibiotic use.  Hx significant for diverticulitis with abscess formation and colon resection.  Pt aware and in agreement with plan.  Will go to ED with wife by private vehicle.     Rennis Harding, PA-C 09/25/18 1411

## 2018-12-27 ENCOUNTER — Other Ambulatory Visit: Payer: Self-pay

## 2018-12-27 ENCOUNTER — Ambulatory Visit (HOSPITAL_COMMUNITY)
Admission: EM | Admit: 2018-12-27 | Discharge: 2018-12-27 | Disposition: A | Discharge status: Home / Self Care | Payer: 59 | Attending: Family Medicine | Admitting: Family Medicine

## 2018-12-27 ENCOUNTER — Encounter (HOSPITAL_COMMUNITY): Payer: Self-pay

## 2018-12-27 ENCOUNTER — Telehealth: Payer: Self-pay | Admitting: General Practice

## 2018-12-27 DIAGNOSIS — R42 Dizziness and giddiness: Secondary | ICD-10-CM | POA: Diagnosis not present

## 2018-12-27 DIAGNOSIS — I482 Chronic atrial fibrillation, unspecified: Secondary | ICD-10-CM

## 2018-12-27 DIAGNOSIS — E089 Diabetes mellitus due to underlying condition without complications: Secondary | ICD-10-CM

## 2018-12-27 MED ORDER — MECLIZINE HCL 12.5 MG PO TABS: 12.5000 mg | ORAL_TABLET | Freq: Three times a day (TID) | ORAL | 0 refills | Status: DC | PRN

## 2018-12-27 NOTE — Telephone Encounter (Signed)
-----   Message from Robyn Haber, MD sent at 12/27/2018  3:15 PM EDT ----- Patient with dizziness, fatigue, chronic infections.  Possible Covid

## 2018-12-27 NOTE — ED Provider Notes (Signed)
[facility]    CSN: [id number] Arrival date & time: 12/27/18  1440     History   Chief Complaint Chief Complaint  Patient presents with  . Dizziness  . Abscess    HPI Verda Cuminsnthony D Legros is a 63 y.o. male.   Patient presents to Urgent Care with complaints of dizziness the past few days and feeling like he has an abscess beginning above his right temple (states he feels like he could squeeze pus out of it). Patient reports he has not had a cough or shortness of breath, has been trying to drink lots of water.   His blood sugars have been running under 120.  His blood glucose had been poorly controlled, but over the last week he's been much better and the chronic abscess sores on right side of head and left lower abdominal wall are starting to heal after several weeks of doxycycline.  He has chronic A. Fibrillation.  No recent chest pain, nausea, vomiting, diarrhea, shortness of breath or cough.  He has chronic tinnitus.  Established patient.     Past Medical History:  Diagnosis Date  . Atrial fibrillation [facility](HCC) March 2015  . Diabetes mellitus without complication (HCC)    NIDDM, type II  . High cholesterol   . Hydradenitis   . Sebaceous cyst    hx of  . Sigmoid diverticulitis   . Wears glasses     Patient Active Problem List   Diagnosis Date Noted  . Diverticulitis large intestine 04/27/2015  . Atrial fibrillation with RVR (HCC) 10/17/2014  . Smoker 10/17/2014  . Demand ischemia from AF with RVR-Troponin 0.07 10/17/2014  . Diverticulitis of large intestine with abscess without bleeding 10/15/2014  . Diverticulitis of intestine with abscess without bleeding 10/12/2014  . Colonic diverticular abscess- s/p colectomy 10/15/14 10/12/2014  . Folliculitis 10/12/2014  . Sepsis (HCC) 10/12/2014  . Diabetes mellitus, type 2 (HCC) 10/12/2014  . Hidradenitis suppurativa 08/02/2012    Past Surgical History:  Procedure Laterality Date  . COLECTOMY  10/15/14  . hand  sugery    . ILEO LOOP COLOSTOMY CLOSURE N/A 04/27/2015   Procedure: DIAGNOSTIC LAPAROSCOPY,  EXPLORATORY LAPAROTOMY WITH RESECTION AND CLOSURE OF COLOSTOMY ;  Surgeon: Claud KelpHaywood Ingram, MD;  Location: [facility] OR;  Service: General;  Laterality: N/A;  . LAPAROTOMY N/A 10/15/2014   Procedure: EXPLORATORY LAPAROTOMY WITH DRAINAGE OF INTRA- ABDOMINAL ABSCESS;  Surgeon: Claud KelpHaywood Ingram, MD;  Location: [facility] OR;  Service: General;  Laterality: N/A;  . ORCHIECTOMY    . PARTIAL COLECTOMY N/A 10/15/2014   Procedure: SIGMOID COLECTOMY WITH COLOSTOMY CREATION;  Surgeon: Claud KelpHaywood Ingram, MD;  Location: [facility] OR;  Service: General;  Laterality: N/A;  . PILONIDAL CYST EXCISION N/A 01/01/2015   Procedure: EXCISION OF PILONIDAL DISEASE;  Surgeon: Romie LeveeAlicia Thomas, MD;  [facility] [place];  Service: General;  Laterality: N/A;       Home Medications    Prior to Admission medications   Medication Sig Start Date End Date Taking? Authorizing Provider  albuterol (PROVENTIL HFA;VENTOLIN HFA) 108 (90 Base) MCG/ACT inhaler Inhale 1-2 puffs into the lungs every 6 (six) hours as needed for wheezing or shortness of breath. 08/05/17   Wallis BambergMani, Mario, PA-C  aspirin 81 MG tablet Take 81 mg by mouth daily.    [provider]  atorvastatin (LIPITOR) 10 MG tablet Take 10 mg by mouth daily.    [provider]  docusate sodium (COLACE) 100 MG capsule Take 200 mg by mouth daily.  [provider]  doxycycline (VIBRA-TABS) 100 MG tablet Take 100 mg by mouth 2 (two) times daily.    [provider]  Dulaglutide (TRULICITY) 6.44 IH/4.7QQ SOPN Inject into the skin.    [provider]  empagliflozin (JARDIANCE) 10 MG TABS tablet Take 10 mg by mouth daily.    [provider]  insulin aspart (NOVOLOG) 100 UNIT/ML injection Inject into the skin 3 (three) times daily before meals.    [provider]  losartan (COZAAR) 50 MG tablet Take 50 mg by mouth daily.    [provider]  meclizine (ANTIVERT) 12.5 MG tablet Take 1 tablet (12.5 mg total) by mouth 3 (three) times daily as needed for dizziness. 12/27/18   Robyn Haber, MD  metFORMIN (GLUCOPHAGE) 500 MG tablet Take 500 mg by mouth 2 (two) times daily with a meal.    [provider]  Metoprolol Succinate 25 MG CS24 Take by mouth.    [provider]  metroNIDAZOLE (FLAGYL) 500 MG tablet Take 1 tablet (500 mg total) by mouth 3 (three) times daily. 05/02/18   Vanessa Kick, MD  Multiple Vitamin (MULTIVITAMIN) tablet Take 1 tablet by mouth daily.    [provider]  NON FORMULARY     [provider]  polyethylene glycol (MIRALAX / GLYCOLAX) packet Take 17 g by mouth daily.    [provider]  rivaroxaban (XARELTO) 20 MG TABS tablet Take 20 mg by mouth daily with supper.    [provider]    Family History Family History  Problem Relation Age of Onset  . Healthy Mother     Social History Social History   Tobacco Use  . Smoking status: Former Smoker    Packs/day: 0.25    Years: 20.00    Pack years: 5.00    Types: Cigarettes    Quit date: 2018    Years since quitting: 2.4  . Smokeless tobacco: Never Used  Substance Use Topics  . Alcohol use: Yes    Comment: Social- moderate - one beer at the wedding    . Drug use: No     Allergies   Penicillins   Review of Systems Review of Systems  Constitutional: Positive for fatigue. Negative for chills, diaphoresis and fever.  HENT: Positive for congestion.   Respiratory: Negative for cough and chest tightness.   Cardiovascular: Positive for palpitations. Negative for chest pain.  Gastrointestinal: Positive for constipation, diarrhea and nausea. Negative for abdominal distention.  Skin: Positive for rash.  All other systems reviewed and are negative.    Physical Exam Triage Vital Signs ED Triage Vitals [12/27/18 1459]  Enc Vitals Group     BP      Pulse      Resp      Temp       Temp src      SpO2      Weight      Height      Head Circumference      Peak Flow      Pain Score 7     Pain Loc      Pain Edu?      Excl. in [place]?    No data found.  Updated Vital Signs BP (!) 150/108 (BP Location: Left Arm) Comment: pt has not taken his daily meds yet today  Pulse 84   Temp 98.7 F (37.1 C) (Oral)   Resp 18   SpO2 99%    Physical Exam Vitals signs  and nursing note reviewed.  Constitutional:      Appearance: Normal appearance. He is obese. He is not ill-appearing.  HENT:     Head: Normocephalic and atraumatic.     Right Ear: Tympanic membrane, ear canal and external ear normal.     Left Ear: Tympanic membrane, ear canal and external ear normal.     Nose: Nose normal.     Mouth/Throat:     Mouth: Mucous membranes are moist.  Eyes:     Conjunctiva/sclera: Conjunctivae normal.     Pupils: Pupils are equal, round, and reactive to light.  Neck:     Musculoskeletal: Normal range of motion and neck supple.     Vascular: No carotid bruit.  Cardiovascular:     Rate and Rhythm: Normal rate. Rhythm irregular.  Pulmonary:     Effort: Pulmonary effort is normal.     Breath sounds: Normal breath sounds.  Abdominal:     General: Bowel sounds are normal.     Tenderness: There is no abdominal tenderness.     Comments: Midline surgical scars  Musculoskeletal: Normal range of motion.  Skin:    General: Skin is warm and dry.  Neurological:     General: No focal deficit present.     Mental Status: He is alert and oriented to person, place, and time.  Psychiatric:        Mood and Affect: Mood normal.        Behavior: Behavior normal.      UC Treatments / Results  Labs (all labs ordered are listed, but only abnormal results are displayed) Labs Reviewed - No data to display  EKG None  Radiology No results found.  Procedures Procedures (including critical care time)  Medications Ordered in UC Medications - No data to display  Initial Impression /  Assessment and Plan / UC Course  I have reviewed the triage vital signs and the nursing notes.  Pertinent labs & imaging results that were available during my care of the patient were reviewed by me and considered in my medical decision making (see chart for details).    Final Clinical Impressions(s) / UC Diagnoses   Final diagnoses:  Vertigo  Diabetes mellitus due to underlying condition, controlled, without complication, without long-term current use of insulin (HCC)  Chronic atrial fibrillation     Discharge Instructions     [facility] an [facility]    ED Prescriptions    Medication Sig Dispense Auth. Provider   meclizine (ANTIVERT) 12.5 MG tablet Take 1 tablet (12.5 mg total) by mouth 3 (three) times daily as needed for dizziness. 30 tablet Elvina SidleLauenstein, Heavenly Christine, MD     Controlled Substance Prescriptions [place] Controlled Substance Registry consulted? Not Applicable   Elvina SidleLauenstein, Octavius Shin, MD 12/27/18 1524

## 2018-12-27 NOTE — Telephone Encounter (Signed)
Pt has been scheduled for Covid-19 testing.  Pt was referred by: Robyn Haber, MD

## 2018-12-27 NOTE — Discharge Instructions (Addendum)
The testing clinic will call you ([place]) to schedule an appointment for Covid testing

## 2018-12-27 NOTE — ED Triage Notes (Signed)
Patient presents to Urgent Care with complaints of dizziness the past few days and feeling like he has an abscess beginning above his right temple (states he feels like he could squeeze pus out of it). Patient reports he has not had a cough or shortness of breath, has been trying to drink lots of water.

## 2018-12-30 ENCOUNTER — Other Ambulatory Visit: Payer: 59

## 2019-01-01 ENCOUNTER — Telehealth (HOSPITAL_COMMUNITY): Payer: Self-pay | Admitting: Emergency Medicine

## 2019-01-01 LAB — NOVEL CORONAVIRUS, NAA: SARS-CoV-2, NAA: NOT DETECTED

## 2019-01-01 NOTE — Telephone Encounter (Signed)
Your test for COVID-19 was negative.  Please continue good preventive care measures, including:  frequent hand-washing, avoid touching your face, cover coughs/sneezes, stay out of crowds and keep a 6 foot distance from others.  If you develop fever/cough/breathlessness, please stay home for 10 days and until you have had 3 consecutive days with cough/breathlessness improving and without fever (without taking a fever reducer). Go to the nearest hospital ED tent for assessment if fever/cough/breathlessness are severe or illness seems like a threat to life..  Patient contacted and made aware of all results, all questions answered.  

## 2019-04-23 ENCOUNTER — Other Ambulatory Visit: Payer: Self-pay

## 2019-04-23 ENCOUNTER — Encounter (HOSPITAL_COMMUNITY): Payer: Self-pay

## 2019-04-23 ENCOUNTER — Ambulatory Visit (INDEPENDENT_AMBULATORY_CARE_PROVIDER_SITE_OTHER): Payer: 59

## 2019-04-23 ENCOUNTER — Ambulatory Visit (HOSPITAL_COMMUNITY)
Admission: EM | Admit: 2019-04-23 | Discharge: 2019-04-23 | Disposition: A | Discharge status: Home / Self Care | Payer: 59 | Attending: Family Medicine | Admitting: Family Medicine

## 2019-04-23 DIAGNOSIS — R0602 Shortness of breath: Secondary | ICD-10-CM | POA: Diagnosis not present

## 2019-04-23 DIAGNOSIS — R197 Diarrhea, unspecified: Secondary | ICD-10-CM

## 2019-04-23 DIAGNOSIS — R1013 Epigastric pain: Secondary | ICD-10-CM

## 2019-04-23 DIAGNOSIS — Z20828 Contact with and (suspected) exposure to other viral communicable diseases: Secondary | ICD-10-CM | POA: Insufficient documentation

## 2019-04-23 LAB — COMPREHENSIVE METABOLIC PANEL
ALT: 22 U/L (ref 0–44)
AST: 25 U/L (ref 15–41)
Albumin: 3.7 g/dL (ref 3.5–5.0)
Alkaline Phosphatase: 56 U/L (ref 38–126)
Anion gap: 15 (ref 5–15)
BUN: 20 mg/dL (ref 8–23)
CO2: 18 mmol/L — ABNORMAL LOW (ref 22–32)
Calcium: 9.4 mg/dL (ref 8.9–10.3)
Chloride: 102 mmol/L (ref 98–111)
Creatinine, Ser: 1.35 mg/dL — ABNORMAL HIGH (ref 0.61–1.24)
GFR calc Af Amer: >60 mL/min (ref >60)
GFR calc non Af Amer: 56 mL/min — ABNORMAL LOW (ref >60)
Glucose, Bld: 353 mg/dL — ABNORMAL HIGH (ref 70–99)
Potassium: 4.7 mmol/L (ref 3.5–5.1)
Sodium: 135 mmol/L (ref 135–145)
Total Bilirubin: 1.1 mg/dL (ref 0.3–1.2)
Total Protein: 8.1 g/dL (ref 6.5–8.1)

## 2019-04-23 LAB — CBC WITH DIFFERENTIAL/PLATELET
Abs Immature Granulocytes: 0.04 10*3/uL (ref 0.00–0.07)
Basophils Absolute: 0.1 10*3/uL (ref 0.0–0.1)
Basophils Relative: 1 %
Eosinophils Absolute: 0.1 10*3/uL (ref 0.0–0.5)
Eosinophils Relative: 1 %
HCT: 48.3 % (ref 39.0–52.0)
Hemoglobin: 16.7 g/dL (ref 13.0–17.0)
Immature Granulocytes: 1 %
Lymphocytes Relative: 38 %
Lymphs Abs: 3.2 10*3/uL (ref 0.7–4.0)
MCH: 30.9 pg (ref 26.0–34.0)
MCHC: 34.6 g/dL (ref 30.0–36.0)
MCV: 89.4 fL (ref 80.0–100.0)
Monocytes Absolute: 0.7 10*3/uL (ref 0.1–1.0)
Monocytes Relative: 8 %
Neutro Abs: 4.3 10*3/uL (ref 1.7–7.7)
Neutrophils Relative %: 51 %
Platelets: 271 10*3/uL (ref 150–400)
RBC: 5.4 MIL/uL (ref 4.22–5.81)
RDW: 12 % (ref 11.5–15.5)
WBC: 8.3 10*3/uL (ref 4.0–10.5)
nRBC: 0 % (ref 0.0–0.2)

## 2019-04-23 LAB — LIPASE, BLOOD: Lipase: 45 U/L (ref 11–51)

## 2019-04-23 LAB — AMYLASE: Amylase: 37 U/L (ref 28–100)

## 2019-04-23 NOTE — Discharge Instructions (Addendum)
You have been seen at the [facility] today for abdominal pain and shortness of breath.Your evaluation today was not suggestive of any condition requiring immediate medical intervention at this time. Your ECG (heart tracing) showed your chronic atrial fibrillation. Some medical problems make take more time to appear.though. Therefore, it's very important that you pay attention to any new symptoms or worsening of your current condition.  Please proceed directly to the Emergency Department immediately should you feel worse in any way or have any of the following symptoms: increasing or different abdominal pain, chest pain, shortness of breath, or nausea and vomiting.  Please do your best to ensure adequate fluid intake in order to avoid dehydration. If you find that you are unable to tolerate drinking fluids regularly please proceed to the Emergency Department for evaluation.  If your Covid-19 test is positive, you will get a phone call from [facility] regarding your results. If your Covid-19 test is negative, you will NOT get a phone call from [facility] with your results. You may view your results on MyChart. If you do not have a MyChart account, sign up instructions are in your discharge papers.

## 2019-04-23 NOTE — ED Provider Notes (Signed)
[facility]   [id number] 04/23/19 Arrival Time: 1030  ASSESSMENT & PLAN:  1. SOB (shortness of breath)   2. Abdominal pain, epigastric   3. Diarrhea, unspecified type     Unclear etiology. Overall benign exam. VSS. No indications for urgent abdominal/pelvic imaging at this time. Discussed ED evaluation for further workup. He is comfortable with close observation over the next 24 hours; await blood work and COVID testing. Agrees to proceed to the ED should his symptoms worsen or should he develop new concerning symptoms.  EKG: shows A-Fib with RVR. No signs of ischemia. No STEMI. I have personally viewed the imaging studies ordered this visit. No acute findings on AAS.  Pending: Orders Placed This Encounter  Procedures  . Novel Coronavirus, NAA (Hosp order, Send-out to Ref Lab; TAT 18-24 hrs  . CBC with Differential  . Comprehensive metabolic panel  . Amylase  . Lipase, blood  Will notify of any significant abnormalities.   Discharge Instructions     You have been seen at the [facility] today for abdominal pain and shortness of breath.Your evaluation today was not suggestive of any condition requiring immediate medical intervention at this time. Your ECG (heart tracing) showed your chronic atrial fibrillation. Some medical problems make take more time to appear.though. Therefore, it's very important that you pay attention to any new symptoms or worsening of your current condition.  Please proceed directly to the Emergency Department immediately should you feel worse in any way or have any of the following symptoms: increasing or different abdominal pain, chest pain, shortness of breath, or nausea and vomiting.  Please do your best to ensure adequate fluid intake in order to avoid dehydration. If you find that you are unable to tolerate drinking fluids regularly please proceed to the Emergency Department for evaluation.  If your Covid-19 test is positive, you  will get a phone call from [facility] regarding your results. If your Covid-19 test is negative, you will NOT get a phone call from [facility] with your results. You may view your results on MyChart. If you do not have a MyChart account, sign up instructions are in your discharge papers.      Reviewed expectations re: course of current medical issues. Questions answered. Outlined signs and symptoms indicating need for more acute intervention. Patient verbalized understanding. After Visit Summary given.  SUBJECTIVE: History from: patient. Leroy Chen is a 63 y.o. male with DM, chronic a-fib, and hypercholesterolemia who presents with complaint of intermittent epigastric abdominal discomfort described as "a burning" along with non-bloody loose stools noted over the past 3-4 days. Occasional sensation of feeling SOB; random; more with exertion. No associated CP or palpatations. No n/v. Appetite decreased though. H/O occasional GERD symptoms; does not treat medically. Last BM this morning; loose. No urinary symptoms. LE without edema. Ambulatory without difficulty. No extremity sensation changes or weakness. Does reports mild to moderate fatigue over the past several days. No known sick contacts or COVID exposures. No fevers reported. In general, reports no specific aggravating or alleviating factors related to current symptoms. No OTC treatment reported. No new medications.   Past Surgical History:  Procedure Laterality Date  . COLECTOMY  10/15/14  . hand sugery    . ILEO LOOP COLOSTOMY CLOSURE N/A 04/27/2015   Procedure: DIAGNOSTIC LAPAROSCOPY,  EXPLORATORY LAPAROTOMY WITH RESECTION AND CLOSURE OF COLOSTOMY ;  Surgeon: [name], MD;  Location: [facility] OR;  Service: General;  Laterality: N/A;  . LAPAROTOMY N/A  10/15/2014   Procedure: EXPLORATORY LAPAROTOMY WITH DRAINAGE OF INTRA- ABDOMINAL ABSCESS;  Surgeon: Claud KelpHaywood Ingram, MD;  Location: [facility] OR;  Service: General;  Laterality: N/A;  .  ORCHIECTOMY    . PARTIAL COLECTOMY N/A 10/15/2014   Procedure: SIGMOID COLECTOMY WITH COLOSTOMY CREATION;  Surgeon: Claud KelpHaywood Ingram, MD;  Location: [facility] OR;  Service: General;  Laterality: N/A;  . PILONIDAL CYST EXCISION N/A 01/01/2015   Procedure: EXCISION OF PILONIDAL DISEASE;  Surgeon: Romie LeveeAlicia Thomas, MD;  [facility];  Service: General;  Laterality: N/A;    ROS: As per HPI. All other systems negative.  OBJECTIVE:  Vitals:   04/23/19 1052  BP: 137/82  Pulse: 94  Resp: 16  Temp: 98.5 F (36.9 C)  TempSrc: Oral  SpO2: 99%    General appearance: alert, oriented, no acute distress HEENT: [place]; AT; oropharynx moist  Neck: supple with FROM Lungs: clear to auscultation bilaterally; unlabored respirations Heart: regular rate and rhythm Abdomen: soft; without distention; mild  and poorly localized tenderness to palpation over epigastric area of abdomen; normal bowel sounds; without masses or organomegaly; without guarding or rebound tenderness Back: without CVA tenderness; FROM at waist Extremities: without LE edema; symmetrical; without gross deformities Skin: warm and dry Neurologic: normal gait Psychological: alert and cooperative; normal mood and affect  Imaging: Dg Abd Acute W/chest  Result Date: 04/23/2019 CLINICAL DATA:  Shortness of breath, wheezing and abdominal pain for 3 days. EXAM: DG ABDOMEN ACUTE W/ 1V CHEST COMPARISON:  Radiographs 05/01/2015 FINDINGS: The upright chest x-ray demonstrates normal cardiomediastinal contours. The lungs are clear. No pleural effusion. The bony thorax is intact. Two views of the abdomen demonstrate an unremarkable bowel gas pattern. There is scattered air and stool in the colon and down into the rectum and scattered loops of small bowel with air but no distension. The soft tissue shadows of the abdomen are maintained. No worrisome calcifications. The bony structures are unremarkable. IMPRESSION: Unremarkable acute abdominal  series. Electronically Signed   By: Rudie MeyerP.  Gallerani M.D.   On: 04/23/2019 12:08     Allergies  Allergen Reactions  . Penicillins Swelling                                               Past Medical History:  Diagnosis Date  . Atrial fibrillation [facility](HCC) March 2015  . Diabetes mellitus without complication (HCC)    NIDDM, type II  . High cholesterol   . Hydradenitis   . Sebaceous cyst    hx of  . Sigmoid diverticulitis   . Wears glasses    Social History   Socioeconomic History  . Marital status: Married    Spouse name: Not on file  . Number of children: Not on file  . Years of education: Not on file  . Highest education level: Not on file  Occupational History  . Not on file  Social Needs  . Financial resource strain: Not on file  . Food insecurity    Worry: Not on file    Inability: Not on file  . Transportation needs    Medical: Not on file    Non-medical: Not on file  Tobacco Use  . Smoking status: Former Smoker    Packs/day: 0.25    Years: 20.00    Pack years: 5.00    Types: Cigarettes    Quit date: 2018  Years since quitting: 2.7  . Smokeless tobacco: Never Used  Substance and Sexual Activity  . Alcohol use: Yes    Comment: Social- moderate - one beer at the wedding    . Drug use: No  . Sexual activity: Yes    Birth control/protection: Condom  Lifestyle  . Physical activity    Days per week: Not on file    Minutes per session: Not on file  . Stress: Not on file  Relationships  . Social Herbalist on phone: Not on file    Gets together: Not on file    Attends religious service: Not on file    Active member of club or organization: Not on file    Attends meetings of clubs or organizations: Not on file    Relationship status: Not on file  . Intimate partner violence    Fear of current or ex partner: Not on file    Emotionally abused: Not on file    Physically abused: Not on file    Forced sexual activity: Not on file  Other Topics  Concern  . Not on file  Social History Narrative  . Not on file   Family History  Problem Relation Age of Onset  . Healthy Mother      Vanessa Kick, MD 04/23/19 1235

## 2019-04-23 NOTE — ED Triage Notes (Signed)
Pt presents to UC stating he has been SOB while walking, diarrhea, burning in abdomen at umbilicus for 3-4 days.

## 2019-04-25 ENCOUNTER — Telehealth (HOSPITAL_COMMUNITY): Payer: Self-pay | Admitting: Emergency Medicine

## 2019-04-25 LAB — NOVEL CORONAVIRUS, NAA (HOSP ORDER, SEND-OUT TO REF LAB; TAT 18-24 HRS): SARS-CoV-2, NAA: NOT DETECTED

## 2019-04-25 NOTE — Telephone Encounter (Signed)
Contacted patient about results, informed him he needs to drink a lot of fluids and follow up with PCP on Monday for recheck, if unable to follow up, return here for recheck of labs. If at any time gets worse, go to the ER. Pt agreeable to plan, all questions answered.

## 2019-04-28 ENCOUNTER — Encounter (HOSPITAL_COMMUNITY): Payer: Self-pay

## 2019-04-28 ENCOUNTER — Other Ambulatory Visit: Payer: Self-pay

## 2019-04-28 ENCOUNTER — Ambulatory Visit (HOSPITAL_COMMUNITY)
Admission: EM | Admit: 2019-04-28 | Discharge: 2019-04-28 | Disposition: A | Discharge status: Home / Self Care | Payer: 59 | Attending: Family Medicine | Admitting: Family Medicine

## 2019-04-28 DIAGNOSIS — R899 Unspecified abnormal finding in specimens from other organs, systems and tissues: Secondary | ICD-10-CM

## 2019-04-28 DIAGNOSIS — I10 Essential (primary) hypertension: Secondary | ICD-10-CM | POA: Diagnosis present

## 2019-04-28 LAB — BASIC METABOLIC PANEL
Anion gap: 10 (ref 5–15)
BUN: 15 mg/dL (ref 8–23)
CO2: 22 mmol/L (ref 22–32)
Calcium: 9 mg/dL (ref 8.9–10.3)
Chloride: 104 mmol/L (ref 98–111)
Creatinine, Ser: 1.24 mg/dL (ref 0.61–1.24)
GFR calc Af Amer: >60 mL/min (ref >60)
GFR calc non Af Amer: >60 mL/min (ref >60)
Glucose, Bld: 280 mg/dL — ABNORMAL HIGH (ref 70–99)
Potassium: 4.4 mmol/L (ref 3.5–5.1)
Sodium: 136 mmol/L (ref 135–145)

## 2019-04-28 NOTE — ED Provider Notes (Addendum)
[facility]   [id number] 04/28/19 Arrival Time: 1324  ASSESSMENT & PLAN:  1. Abnormal laboratory test   2. Essential hypertension   Increased Cr on CMP at last visit.   Overall, his symptoms from previous visit have improved.  Recheck BMP pending. Will contact him with results if abnormalities still present. Benign abdominal exam. No indications for urgent abdominal/pelvic imaging at this time. Discussed.  No orders of the defined types were placed in this encounter.    Discharge Instructions     Your blood pressure was noted to be elevated during your visit today. You may return here within the next few days to recheck if unable to see your primary care doctor.  BP (!) 147/109 (BP Location: Right Arm)   Pulse 92   Temp 98.2 F (36.8 C) (Oral)   Resp 16   SpO2 99%      Follow-up Information    Schedule an appointment as soon as possible for a visit  with Maudie Flakes, FNP.   Specialty: Family Medicine Contact information: [address] [place] [address] [phone]            Reviewed expectations re: course of current medical issues. Questions answered. Outlined signs and symptoms indicating need for more acute intervention. Patient verbalized understanding. After Visit Summary given.   SUBJECTIVE: History from: patient. Leroy Chen is a 63 y.o. male who I saw here on 04/23/2019. Reports that he is feeling better. Here to recheck lab. Elevated Cr and blood sugar. Reports that he saw his PCP approx 2 w ago; increased insulin. Afebrile. No current SOB or specific abdominal pain.  Increased blood pressure noted today. Reports that he is on BP medications; taking as directed.  He reports no chest pain on exertion, no dyspnea on exertion, no swelling of ankles, no orthostatic dizziness or lightheadedness, no orthopnea or paroxysmal nocturnal dyspnea, no palpitations and no intermittent claudication symptoms.   Past  Surgical History:  Procedure Laterality Date  . COLECTOMY  10/15/14  . hand sugery    . ILEO LOOP COLOSTOMY CLOSURE N/A 04/27/2015   Procedure: DIAGNOSTIC LAPAROSCOPY,  EXPLORATORY LAPAROTOMY WITH RESECTION AND CLOSURE OF COLOSTOMY ;  Surgeon: [name], MD;  Location: [facility];  Service: General;  Laterality: N/A;  . LAPAROTOMY N/A 10/15/2014   Procedure: EXPLORATORY LAPAROTOMY WITH DRAINAGE OF INTRA- ABDOMINAL ABSCESS;  Surgeon: [name], MD;  Location: [facility];  Service: General;  Laterality: N/A;  . ORCHIECTOMY    . PARTIAL COLECTOMY N/A 10/15/2014   Procedure: SIGMOID COLECTOMY WITH COLOSTOMY CREATION;  Surgeon: [name], MD;  Location: [facility];  Service: General;  Laterality: N/A;  . PILONIDAL CYST EXCISION N/A 01/01/2015   Procedure: EXCISION OF PILONIDAL DISEASE;  Surgeon: Romie Levee, MD;  Location: [facility];  Service: General;  Laterality: N/A;    ROS: As per HPI. All other systems negative.  OBJECTIVE:  Vitals:   04/28/19 0904 04/28/19 0911  BP:  (!) 147/109  Pulse: 92   Resp: 16   Temp: 98.2 F (36.8 C)   TempSrc: Oral   SpO2: 99%     General appearance: alert, oriented, no acute distress HEENT: [place]; AT; oropharynx moist Lungs: clear to auscultation bilaterally; unlabored respirations Heart: regular rate and rhythm Abdomen: soft; without distention; no specific tenderness to palpation Back: FROM at waist Extremities: without LE edema; symmetrical; without gross deformities Skin: warm and dry Neurologic: normal gait Psychological: alert and cooperative; normal mood and  affect  Labs: Results for orders placed or performed during the hospital encounter of 04/23/19  Novel Coronavirus, NAA (Hosp order, Send-out to Ref Lab; TAT 18-24 hrs   Specimen: Nasopharyngeal Swab; Respiratory  Result Value Ref Range   SARS-CoV-2, NAA NOT DETECTED NOT DETECTED   Coronavirus Source NASOPHARYNGEAL   CBC with Differential  Result Value Ref Range   WBC  8.3 4.0 - 10.5 K/uL   RBC 5.40 4.22 - 5.81 MIL/uL   Hemoglobin 16.7 13.0 - 17.0 g/dL   HCT 48.3 39.0 - 52.0 %   MCV 89.4 80.0 - 100.0 fL   MCH 30.9 26.0 - 34.0 pg   MCHC 34.6 30.0 - 36.0 g/dL   RDW 12.0 11.5 - 15.5 %   Platelets 271 150 - 400 K/uL   nRBC 0.0 0.0 - 0.2 %   Neutrophils Relative % 51 %   Neutro Abs 4.3 1.7 - 7.7 K/uL   Lymphocytes Relative 38 %   Lymphs Abs 3.2 0.7 - 4.0 K/uL   Monocytes Relative 8 %   Monocytes Absolute 0.7 0.1 - 1.0 K/uL   Eosinophils Relative 1 %   Eosinophils Absolute 0.1 0.0 - 0.5 K/uL   Basophils Relative 1 %   Basophils Absolute 0.1 0.0 - 0.1 K/uL   Immature Granulocytes 1 %   Abs Immature Granulocytes 0.04 0.00 - 0.07 K/uL  Comprehensive metabolic panel  Result Value Ref Range   Sodium 135 135 - 145 mmol/L   Potassium 4.7 3.5 - 5.1 mmol/L   Chloride 102 98 - 111 mmol/L   CO2 18 (L) 22 - 32 mmol/L   Glucose, Bld 353 (H) 70 - 99 mg/dL   BUN 20 8 - 23 mg/dL   Creatinine, Ser 1.35 (H) 0.61 - 1.24 mg/dL   Calcium 9.4 8.9 - 10.3 mg/dL   Total Protein 8.1 6.5 - 8.1 g/dL   Albumin 3.7 3.5 - 5.0 g/dL   AST 25 15 - 41 U/L   ALT 22 0 - 44 U/L   Alkaline Phosphatase 56 38 - 126 U/L   Total Bilirubin 1.1 0.3 - 1.2 mg/dL   GFR calc non Af Amer 56 (L) >60 mL/min   GFR calc Af Amer >60 >60 mL/min   Anion gap 15 5 - 15  Amylase  Result Value Ref Range   Amylase 37 28 - 100 U/L  Lipase, blood  Result Value Ref Range   Lipase 45 11 - 51 U/L   Labs Reviewed  BASIC METABOLIC PANEL    Imaging: No results found.   Allergies  Allergen Reactions  . Penicillins Swelling                                               Past Medical History:  Diagnosis Date  . Atrial fibrillation [facility]) March 2015  . Diabetes mellitus without complication (HCC)    NIDDM, type II  . High cholesterol   . Hydradenitis   . Sebaceous cyst    hx of  . Sigmoid diverticulitis   . Wears glasses    Social History   Socioeconomic History  . Marital status:  Married    Spouse name: Not on file  . Number of children: Not on file  . Years of education: Not on file  . Highest education level: Not on file  Occupational History  . Not  on file  Social Needs  . Financial resource strain: Not on file  . Food insecurity    Worry: Not on file    Inability: Not on file  . Transportation needs    Medical: Not on file    Non-medical: Not on file  Tobacco Use  . Smoking status: Former Smoker    Packs/day: 0.25    Years: 20.00    Pack years: 5.00    Types: Cigarettes    Quit date: 2018    Years since quitting: 2.7  . Smokeless tobacco: Never Used  Substance and Sexual Activity  . Alcohol use: Yes    Comment: Social- moderate - one beer at the wedding    . Drug use: No  . Sexual activity: Yes    Birth control/protection: Condom  Lifestyle  . Physical activity    Days per week: Not on file    Minutes per session: Not on file  . Stress: Not on file  Relationships  . Social Musicianconnections    Talks on phone: Not on file    Gets together: Not on file    Attends religious service: Not on file    Active member of club or organization: Not on file    Attends meetings of clubs or organizations: Not on file    Relationship status: Not on file  . Intimate partner violence    Fear of current or ex partner: Not on file    Emotionally abused: Not on file    Physically abused: Not on file    Forced sexual activity: Not on file  Other Topics Concern  . Not on file  Social History Narrative  . Not on file   Family History  Problem Relation Age of Onset  . Healthy Mother      Mardella LaymanHagler, Miyanna Wiersma, MD 04/28/19 [id number]    Mardella LaymanHagler, Kambra Beachem, MD 04/28/19 [phone]

## 2019-04-28 NOTE — Discharge Instructions (Addendum)
Your blood pressure was noted to be elevated during your visit today. You may return here within the next few days to recheck if unable to see your primary care doctor.  BP (!) 147/109 (BP Location: Right Arm)    Pulse 92    Temp 98.2 F (36.8 C) (Oral)    Resp 16    SpO2 99%

## 2019-04-28 NOTE — ED Triage Notes (Signed)
Pt presents to UC stating he was seen here one day last week for sob and labs were drawn. His labs results were resulted and he was called the following day. He states that he was told that if he couldn't be seen by his PCP to come here for lab redraw.

## 2019-04-29 ENCOUNTER — Telehealth (HOSPITAL_COMMUNITY): Payer: Self-pay | Admitting: Emergency Medicine

## 2019-04-29 NOTE — Telephone Encounter (Signed)
Called pt and gave him follow up labs, labs better than 1 week ago, but glucose still elevated. Pt needs follow up with PCP for diabetes control. Pt agreeable to plan, all questions answered.

## 2020-11-10 ENCOUNTER — Emergency Department (HOSPITAL_COMMUNITY)
Admission: EM | Admit: 2020-11-10 | Discharge: 2020-11-10 | Disposition: A | Discharge status: Home / Self Care | Payer: No Typology Code available for payment source | Attending: Emergency Medicine | Admitting: Emergency Medicine

## 2020-11-10 ENCOUNTER — Emergency Department (HOSPITAL_COMMUNITY): Payer: No Typology Code available for payment source

## 2020-11-10 ENCOUNTER — Encounter (HOSPITAL_COMMUNITY): Payer: Self-pay

## 2020-11-10 DIAGNOSIS — Z7901 Long term (current) use of anticoagulants: Secondary | ICD-10-CM | POA: Insufficient documentation

## 2020-11-10 DIAGNOSIS — X58XXXA Exposure to other specified factors, initial encounter: Secondary | ICD-10-CM | POA: Insufficient documentation

## 2020-11-10 DIAGNOSIS — Z79899 Other long term (current) drug therapy: Secondary | ICD-10-CM | POA: Diagnosis not present

## 2020-11-10 DIAGNOSIS — Z7984 Long term (current) use of oral hypoglycemic drugs: Secondary | ICD-10-CM | POA: Diagnosis not present

## 2020-11-10 DIAGNOSIS — Z87891 Personal history of nicotine dependence: Secondary | ICD-10-CM | POA: Insufficient documentation

## 2020-11-10 DIAGNOSIS — T148XXA Other injury of unspecified body region, initial encounter: Secondary | ICD-10-CM | POA: Diagnosis not present

## 2020-11-10 DIAGNOSIS — I4891 Unspecified atrial fibrillation: Secondary | ICD-10-CM | POA: Diagnosis not present

## 2020-11-10 DIAGNOSIS — N3001 Acute cystitis with hematuria: Secondary | ICD-10-CM | POA: Insufficient documentation

## 2020-11-10 DIAGNOSIS — Z794 Long term (current) use of insulin: Secondary | ICD-10-CM | POA: Insufficient documentation

## 2020-11-10 DIAGNOSIS — R319 Hematuria, unspecified: Secondary | ICD-10-CM | POA: Diagnosis present

## 2020-11-10 DIAGNOSIS — R310 Gross hematuria: Secondary | ICD-10-CM

## 2020-11-10 DIAGNOSIS — E119 Type 2 diabetes mellitus without complications: Secondary | ICD-10-CM | POA: Diagnosis not present

## 2020-11-10 LAB — CBC WITH DIFFERENTIAL/PLATELET
Abs Immature Granulocytes: 0.04 10*3/uL (ref 0.00–0.07)
Basophils Absolute: 0 10*3/uL (ref 0.0–0.1)
Basophils Relative: 0 %
Eosinophils Absolute: 0.1 10*3/uL (ref 0.0–0.5)
Eosinophils Relative: 1 %
HCT: 46.4 % (ref 39.0–52.0)
Hemoglobin: 14.8 g/dL (ref 13.0–17.0)
Immature Granulocytes: 0 %
Lymphocytes Relative: 18 %
Lymphs Abs: 2 10*3/uL (ref 0.7–4.0)
MCH: 30.2 pg (ref 26.0–34.0)
MCHC: 31.9 g/dL (ref 30.0–36.0)
MCV: 94.7 fL (ref 80.0–100.0)
Monocytes Absolute: 1.2 10*3/uL — ABNORMAL HIGH (ref 0.1–1.0)
Monocytes Relative: 11 %
Neutro Abs: 7.7 10*3/uL (ref 1.7–7.7)
Neutrophils Relative %: 70 %
Platelets: 176 10*3/uL (ref 150–400)
RBC: 4.9 MIL/uL (ref 4.22–5.81)
RDW: 13.2 % (ref 11.5–15.5)
WBC: 11.1 10*3/uL — ABNORMAL HIGH (ref 4.0–10.5)
nRBC: 0 % (ref 0.0–0.2)

## 2020-11-10 LAB — BASIC METABOLIC PANEL
Anion gap: 17 — ABNORMAL HIGH (ref 5–15)
BUN: 31 mg/dL — ABNORMAL HIGH (ref 8–23)
CO2: 20 mmol/L — ABNORMAL LOW (ref 22–32)
Calcium: 9.6 mg/dL (ref 8.9–10.3)
Chloride: 102 mmol/L (ref 98–111)
Creatinine, Ser: 1.84 mg/dL — ABNORMAL HIGH (ref 0.61–1.24)
GFR, Estimated: 40 mL/min — ABNORMAL LOW (ref >60)
Glucose, Bld: 144 mg/dL — ABNORMAL HIGH (ref 70–99)
Potassium: 5.4 mmol/L — ABNORMAL HIGH (ref 3.5–5.1)
Sodium: 139 mmol/L (ref 135–145)

## 2020-11-10 LAB — URINALYSIS, ROUTINE W REFLEX MICROSCOPIC
Bilirubin Urine: NEGATIVE
Glucose, UA: >=500 mg/dL — AB
Ketones, ur: 20 mg/dL — AB
Leukocytes,Ua: NEGATIVE
Nitrite: NEGATIVE
Protein, ur: 100 mg/dL — AB
RBC / HPF: >50 RBC/hpf — ABNORMAL HIGH (ref 0–5)
Specific Gravity, Urine: 1.025 (ref 1.005–1.030)
pH: 5 (ref 5.0–8.0)

## 2020-11-10 MED ORDER — OXYCODONE-ACETAMINOPHEN 5-325 MG PO TABS
1.0000 | ORAL_TABLET | Freq: Once | ORAL | Status: AC
  Administered 2020-11-10: 1 via ORAL
  Filled 2020-11-10: qty 1

## 2020-11-10 MED ORDER — CEPHALEXIN 500 MG PO CAPS: 500.0000 mg | ORAL_CAPSULE | Freq: Three times a day (TID) | ORAL | 0 refills | Status: DC

## 2020-11-10 MED ORDER — SODIUM CHLORIDE 0.9 % IV BOLUS
500.0000 mL | Freq: Once | INTRAVENOUS | Status: AC
  Administered 2020-11-10: 500 mL via INTRAVENOUS

## 2020-11-10 MED ORDER — SODIUM CHLORIDE 0.9 % IV SOLN
1.0000 g | Freq: Once | INTRAVENOUS | Status: AC
  Administered 2020-11-10: 1 g via INTRAVENOUS
  Filled 2020-11-10: qty 10

## 2020-11-10 MED ORDER — MORPHINE SULFATE (PF) 4 MG/ML IV SOLN
4.0000 mg | Freq: Once | INTRAVENOUS | Status: AC
  Administered 2020-11-10: 4 mg via INTRAVENOUS
  Filled 2020-11-10: qty 1

## 2020-11-10 NOTE — Discharge Instructions (Signed)
You are seen in the ER for penile, scrotal discomfort. Given the recent surgery, there is some evidence of internal bleeding and swelling.  No evidence of infection around the surgical site.  Given that you are having blood in the urine, 1 possibility is infection.  We are starting you on antibiotics for bladder infection. There are other possibilities that can also cause bloody urine therefore urology follow-up is still requested in 5 to 7 days.  Please call the urologist for the closest appointment. Return to the ER if you start having fevers, chills, pus drainage.

## 2020-11-10 NOTE — ED Triage Notes (Signed)
Pt arrived via POV, c/o blood in urine s/p urinary procedural implant done x1 week ago.

## 2020-11-11 NOTE — ED Provider Notes (Signed)
[facility]-EMERGENCY DEPT Provider Note   CSN: [id number] Arrival date & time: 11/10/20  [id number]     History Chief Complaint  Patient presents with  . Hematuria    Verda Cuminsnthony D Huneke is a 65 y.o. male.  HPI    65 year old male comes with a chief complaint of hematuria.  He has history of A. fib, diabetes and [facility].  Patient comes to the ER with bloody urine.  He is about 1 week postop.  He reports that the hematuria never resolved.  He is not passing any clots, but the urine has been grossly bloody the entire time.  More recently he was having some lower quadrant abdominal discomfort and scrotal pain, [facility].  Review of system is negative for any fevers, chills, urinary frequency, burning with urination.  Past Medical History:  Diagnosis Date  . Atrial fibrillation [facility](HCC) March 2015  . Diabetes mellitus without complication (HCC)    NIDDM, type II  . High cholesterol   . Hydradenitis   . Sebaceous cyst    hx of  . Sigmoid diverticulitis   . Wears glasses     Patient Active Problem List   Diagnosis Date Noted  . Diverticulitis large intestine 04/27/2015  . Atrial fibrillation with RVR (HCC) 10/17/2014  . Smoker 10/17/2014  . Demand ischemia from AF with RVR-Troponin 0.07 10/17/2014  . Diverticulitis of large intestine with abscess without bleeding 10/15/2014  . Diverticulitis of intestine with abscess without bleeding 10/12/2014  . Colonic diverticular abscess- s/p colectomy 10/15/14 10/12/2014  . Folliculitis 10/12/2014  . Sepsis (HCC) 10/12/2014  . Diabetes mellitus, type 2 (HCC) 10/12/2014  . Hidradenitis suppurativa 08/02/2012    Past Surgical History:  Procedure Laterality Date  . COLECTOMY  10/15/14  . hand sugery    . ILEO LOOP COLOSTOMY CLOSURE N/A 04/27/2015   Procedure: DIAGNOSTIC LAPAROSCOPY,  EXPLORATORY LAPAROTOMY WITH RESECTION AND CLOSURE OF  COLOSTOMY ;  Surgeon: Claud KelpHaywood Ingram, MD;  Location: [facility] OR;  Service: General;  Laterality: N/A;  . LAPAROTOMY N/A 10/15/2014   Procedure: EXPLORATORY LAPAROTOMY WITH DRAINAGE OF INTRA- ABDOMINAL ABSCESS;  Surgeon: Claud KelpHaywood Ingram, MD;  Location: [facility] OR;  Service: General;  Laterality: N/A;  . ORCHIECTOMY    . PARTIAL COLECTOMY N/A 10/15/2014   Procedure: SIGMOID COLECTOMY WITH COLOSTOMY CREATION;  Surgeon: Claud KelpHaywood Ingram, MD;  Location: [facility] OR;  Service: General;  Laterality: N/A;  . PILONIDAL CYST EXCISION N/A 01/01/2015   Procedure: EXCISION OF PILONIDAL DISEASE;  Surgeon: Romie LeveeAlicia Thomas, MD;  [facility] ;  Service: General;  Laterality: N/A;       Family History  Problem Relation Age of Onset  . Healthy Mother     Social History   Tobacco Use  . Smoking status: Former Smoker    Packs/day: 0.25    Years: 20.00    Pack years: 5.00    Types: Cigarettes    Quit date: 2018    Years since quitting: 4.3  . Smokeless tobacco: Never Used  Vaping Use  . Vaping Use: Never used  Substance Use Topics  . Alcohol use: Yes    Comment: Social- moderate - one beer at the wedding    . Drug use: No    Home Medications Prior to Admission medications   Medication Sig Start Date End Date Taking? Authorizing Provider  Adalimumab 40 MG/0.4ML PNKT Inject 40 mg into  the skin once a week.   Yes [provider]  atorvastatin (LIPITOR) 10 MG tablet Take 10 mg by mouth daily.   Yes [provider]  cephALEXin (KEFLEX) 500 MG capsule Take 1 capsule (500 mg total) by mouth 3 (three) times daily. 11/10/20  Yes Derwood Kaplan, MD  Chlorhexidine Gluconate 4 % SOLN Apply 1 application topically See admin instructions. Apply to armpits & Groin 3 times a week in the shower. Leave on for 1 minute and then rinse   Yes [provider]  clindamycin (CLEOCIN T) 1 % lotion Apply 1 application topically 2 (two) times daily. Draining bumps 03/14/19  Yes [provider]  clobetasol (TEMOVATE) 0.05 % external solution Apply 1 application topically 2 (two) times daily as needed (infection).   Yes [provider]  dicyclomine (BENTYL) 10 MG capsule Take 10 mg by mouth 3 (three) times daily as needed for spasms.   Yes [provider]  doxycycline (VIBRA-TABS) 100 MG tablet Take 100 mg by mouth 2 (two) times daily.   Yes [provider]  empagliflozin (JARDIANCE) 25 MG TABS tablet Take 12.5 mg by mouth daily.   Yes [provider]  albuterol (PROVENTIL HFA;VENTOLIN HFA) 108 (90 Base) MCG/ACT inhaler Inhale 1-2 puffs into the lungs every 6 (six) hours as needed for wheezing or shortness of breath. Patient not taking: Reported on 11/10/2020 08/05/17   Wallis Bamberg, PA-C  Dulaglutide (TRULICITY) 0.75 MG/0.5ML SOPN Inject into the skin.    [provider]  insulin aspart (NOVOLOG) 100 UNIT/ML injection Inject into the skin 3 (three) times daily before meals.    [provider]  losartan (COZAAR) 50 MG tablet Take 50 mg by mouth daily.    [provider]  meclizine (ANTIVERT) 12.5 MG tablet Take 1 tablet (12.5 mg total) by mouth 3 (three) times daily as needed for dizziness. 12/27/18   Elvina Sidle, MD  metFORMIN (GLUCOPHAGE) 500 MG tablet Take 500 mg by mouth 2 (two) times daily with a meal.    [provider]  Metoprolol Succinate 25 MG CS24 Take by mouth.    [provider]  metroNIDAZOLE (FLAGYL) 500 MG tablet Take 1 tablet (500 mg total) by mouth 3 (three) times daily. 05/02/18   Mardella Layman, MD  Multiple Vitamin (MULTIVITAMIN) tablet Take 1 tablet by mouth daily.    [provider]  NON FORMULARY     [provider]  polyethylene glycol (MIRALAX / GLYCOLAX) packet Take 17 g by mouth daily.    [provider]  rivaroxaban (XARELTO) 20 MG TABS tablet Take 20 mg by mouth daily with supper.    [provider]    Allergies     Penicillins  Review of Systems   Review of Systems  Constitutional: Positive for activity change.  Gastrointestinal: Positive for abdominal pain.  Genitourinary: Positive for hematuria. Negative for dysuria.  Allergic/Immunologic: Negative for immunocompromised state.  Hematological: Bruises/bleeds easily.  All other systems reviewed and are negative.   Physical Exam Updated Vital Signs BP (!) 158/68 (BP Location: Right Arm)   Pulse (!) 102   Temp 98.6 F (37 C) (Oral)   Resp (!) 22   SpO2 97%   Physical Exam Vitals and nursing note reviewed.  Constitutional:      Appearance: He is well-developed.  HENT:     Head: Normocephalic and atraumatic.  Eyes:     Conjunctiva/sclera: Conjunctivae normal.     Pupils: Pupils are equal, round,  and reactive to light.  Cardiovascular:     Rate and Rhythm: Normal rate and regular rhythm.  Pulmonary:     Effort: Pulmonary effort is normal.     Breath sounds: Normal breath sounds.  Abdominal:     General: Bowel sounds are normal. There is no distension.     Palpations: Abdomen is soft. There is no mass.     Tenderness: There is no abdominal tenderness. There is no guarding or rebound.  Genitourinary:    Comments: No evidence of scrotal or penile edema.  Patient has surgical wound site over the scrotum, it appears to be healing well.  There is tenderness over the suprapubic region.  Just above the suprapubic region, there is an area of abdominal wall edema without any erythema or crepitus.  There is no crepitus over the groin, scrotal region. Musculoskeletal:        General: No deformity.     Cervical back: Normal range of motion and neck supple.  Skin:    General: Skin is warm.  Neurological:     Mental Status: He is alert and oriented to person, place, and time.     ED Results / Procedures / Treatments   Labs (all labs ordered are listed, but only abnormal results are displayed) Labs Reviewed  BASIC METABOLIC PANEL - Abnormal;  Notable for the following components:      Result Value   Potassium 5.4 (*)    CO2 20 (*)    Glucose, Bld 144 (*)    BUN 31 (*)    Creatinine, Ser 1.84 (*)    GFR, Estimated 40 (*)    Anion gap 17 (*)    All other components within normal limits  CBC WITH DIFFERENTIAL/PLATELET - Abnormal; Notable for the following components:   WBC 11.1 (*)    Monocytes Absolute 1.2 (*)    All other components within normal limits  URINALYSIS, ROUTINE W REFLEX MICROSCOPIC - Abnormal; Notable for the following components:   APPearance HAZY (*)    Glucose, UA >=500 (*)    Hgb urine dipstick LARGE (*)    Ketones, ur 20 (*)    Protein, ur 100 (*)    RBC / HPF >50 (*)    Bacteria, UA RARE (*)    All other components within normal limits  URINE CULTURE    EKG None  Radiology CT ABDOMEN PELVIS WO CONTRAST  Result Date: 11/10/2020 CLINICAL DATA:  Hematuria following penile prosthesis placement. EXAM: CT ABDOMEN AND PELVIS WITHOUT CONTRAST TECHNIQUE: Multidetector CT imaging of the abdomen and pelvis was performed following the standard protocol without IV contrast. COMPARISON:  March of 2016. FINDINGS: Lower chest: Incidental imaging of the lung bases without effusion or sign of consolidation. Heart is incompletely imaged. Hepatobiliary: Moderate hepatic steatosis. Liver is un enlarged. Cholelithiasis. No pericholecystic stranding. Pancreas: Pancreas of normal contours, no signs of inflammation. Spleen: Spleen normal size and contour. Adrenals/Urinary Tract: Mild RIGHT adrenal thickening with small nodule measuring 1.5 x 0.9 cm unchanged dating back to 2016, in fact unchanged dating back to 2007, likely RIGHT adrenal adenoma. Density does not allow for definitive characterizationw. LEFT adrenal is normal. Mild bilateral perinephric stranding, nonspecific not present previously. No hydronephrosis. Urinary bladder displaced from LEFT to RIGHT due to reservoir or for penile prosthesis in the LEFT lower quadrant.  There is some surrounding increased density about the reservoir or compatible with small amount of hematoma measuring approximately 7 mm thickness at the periphery of the distended  rest of or. Small amount of hematoma also present in the LEFT inguinal canal. Lower abdominal wall with 4.5 x 2.9 cm area measuring 37 Hounsfield units in the subcutaneous fat. Trace amounts of gas are present in the subcutaneous fat distant from this area. Corporal elements of the prosthetic device are partially imaged. Penis not imaged in its entirety. Urinary bladder with signs of mild stranding along its margin at the interface with the prosthetic reservoir or. Mild wall thickening of the urinary bladder. There is a preserved fat plane between the reservoir or and the urinary bladder on coronal images. Stomach/Bowel: No acute gastrointestinal process. Signs of prior partial colectomy in the area of the sigmoid colon. Appendix is normal. Vascular/Lymphatic: Calcified and noncalcified atheromatous plaque of the abdominal aorta. Nonaneurysmal caliber. Smooth contour of the IVC. There is no gastrohepatic or hepatoduodenal ligament lymphadenopathy. No retroperitoneal or mesenteric lymphadenopathy. No pelvic sidewall lymphadenopathy. Reproductive: As above. Prostate unremarkable on noncontrast imaging. Other: Body wall hematoma, small and small amount of blood in the LEFT inguinal canal and surrounding the prosthetic reservoir or in the LEFT lower quadrant small hernia of the ventral abdominal wall. No intra-abdominal fluid. Small amount of hematoma tracking along extraperitoneal spaces in the LEFT retroperitoneum to the level of the sacral promontory. Musculoskeletal: No acute bone finding or destructive bone process. Spinal degenerative changes. IMPRESSION: 1. Small amounts of hematoma in the body wall, about the prosthetic reservoir or and in the LEFT inguinal canal. Note that the penis is not imaged and corporal elements are only  partially imaged. There is some stranding about the base of the penis. 2. Tiny locule of gas in the LEFT inguinal canal and in the body wall nonspecific, likely postoperative. Correlate with any signs that would suggest infection. 3. Thickening of the urinary bladder displaced by the reservoir or more likely secondary. However, in the setting of ongoing hematuria urethral and or urinary bladder assessment may be helpful with urologic consultation suggested. 4. Cholelithiasis without evidence of acute cholecystitis. 5. Moderate hepatic steatosis. 6. Mild RIGHT adrenal thickening with small nodule measuring 1.5 x 0.9 cm unchanged dating back to 2016, in fact unchanged dating back to 2007, likely RIGHT adrenal adenoma. Consider 1 year follow-up with adrenal protocol CT given indeterminate Hounsfield unit values on the current study. 7. Signs of prior partial colectomy in the area of the sigmoid colon. 8. Aortic atherosclerosis. Electronically Signed   By: Donzetta Kohut M.D.   On: 11/10/2020 15:28    Procedures Procedures   Medications Ordered in ED Medications  morphine 4 MG/ML injection 4 mg (4 mg Intravenous Given 11/10/20 1213)  cefTRIAXone (ROCEPHIN) 1 g in sodium chloride 0.9 % 100 mL IVPB (0 g Intravenous Stopped 11/10/20 1738)  sodium chloride 0.9 % bolus 500 mL (0 mLs Intravenous Stopped 11/10/20 1738)  oxyCODONE-acetaminophen (PERCOCET/ROXICET) 5-325 MG per tablet 1 tablet (1 tablet Oral Given 11/10/20 1624)    ED Course  I have reviewed the triage vital signs and the nursing notes.  Pertinent labs & imaging results that were available during my care of the patient were reviewed by me and considered in my medical decision making (see chart for details).    MDM Rules/Calculators/A&P                          65 year old comes in a chief complaint of hematuria and lower quadrant abdominal pain.  Status post penile implant a week ago.  Continues to have hematuria.  [facility]  He is also complaining of some abdominal wall swelling.  Concerns for hematoma, abscess.  History is not suggestive of underlying infection.  Noncontrasted CT ordered.  Results did indicate expected hematoma and gas, that clinically appears to be from the procedure and not because of deep space infection.  There is no evidence of abdominal wall abscess.  A copy of the CT scan was provided to the patient.  Antibiotic started and advised close urology follow-up.  Patient advised that if hematuria does not clear with antibiotics, then his urologist or PCP will have to look for other cause for the bloody urine.  Final Clinical Impression(s) / ED Diagnoses Final diagnoses:  Gross hematuria  Acute cystitis with hematuria  Hematoma    Rx / DC Orders ED Discharge Orders         Ordered    cephALEXin (KEFLEX) 500 MG capsule  3 times daily        11/10/20 1558           Derwood Kaplan, MD 11/11/20 2326

## 2020-11-12 ENCOUNTER — Emergency Department (HOSPITAL_COMMUNITY): Payer: No Typology Code available for payment source

## 2020-11-12 ENCOUNTER — Encounter (HOSPITAL_COMMUNITY): Payer: Self-pay

## 2020-11-12 ENCOUNTER — Observation Stay (HOSPITAL_COMMUNITY)
Admission: EM | Admit: 2020-11-12 | Discharge: 2020-11-13 | Disposition: A | Discharge status: Home / Self Care | Payer: No Typology Code available for payment source | Attending: Internal Medicine | Admitting: Internal Medicine

## 2020-11-12 DIAGNOSIS — E1122 Type 2 diabetes mellitus with diabetic chronic kidney disease: Secondary | ICD-10-CM | POA: Insufficient documentation

## 2020-11-12 DIAGNOSIS — N183 Chronic kidney disease, stage 3 unspecified: Secondary | ICD-10-CM

## 2020-11-12 DIAGNOSIS — Z7984 Long term (current) use of oral hypoglycemic drugs: Secondary | ICD-10-CM | POA: Diagnosis not present

## 2020-11-12 DIAGNOSIS — E119 Type 2 diabetes mellitus without complications: Secondary | ICD-10-CM

## 2020-11-12 DIAGNOSIS — I480 Paroxysmal atrial fibrillation: Principal | ICD-10-CM | POA: Insufficient documentation

## 2020-11-12 DIAGNOSIS — E871 Hypo-osmolality and hyponatremia: Secondary | ICD-10-CM | POA: Diagnosis not present

## 2020-11-12 DIAGNOSIS — I129 Hypertensive chronic kidney disease with stage 1 through stage 4 chronic kidney disease, or unspecified chronic kidney disease: Secondary | ICD-10-CM | POA: Diagnosis not present

## 2020-11-12 DIAGNOSIS — Z7901 Long term (current) use of anticoagulants: Secondary | ICD-10-CM | POA: Diagnosis not present

## 2020-11-12 DIAGNOSIS — R42 Dizziness and giddiness: Secondary | ICD-10-CM | POA: Diagnosis present

## 2020-11-12 DIAGNOSIS — Z794 Long term (current) use of insulin: Secondary | ICD-10-CM | POA: Diagnosis not present

## 2020-11-12 DIAGNOSIS — Z20822 Contact with and (suspected) exposure to covid-19: Secondary | ICD-10-CM | POA: Insufficient documentation

## 2020-11-12 DIAGNOSIS — Z87891 Personal history of nicotine dependence: Secondary | ICD-10-CM | POA: Insufficient documentation

## 2020-11-12 DIAGNOSIS — Z79899 Other long term (current) drug therapy: Secondary | ICD-10-CM | POA: Diagnosis not present

## 2020-11-12 DIAGNOSIS — I4891 Unspecified atrial fibrillation: Secondary | ICD-10-CM | POA: Diagnosis present

## 2020-11-12 LAB — BASIC METABOLIC PANEL
Anion gap: 15 (ref 5–15)
BUN: 34 mg/dL — ABNORMAL HIGH (ref 8–23)
CO2: 17 mmol/L — ABNORMAL LOW (ref 22–32)
Calcium: 8.8 mg/dL — ABNORMAL LOW (ref 8.9–10.3)
Chloride: 99 mmol/L (ref 98–111)
Creatinine, Ser: 1.69 mg/dL — ABNORMAL HIGH (ref 0.61–1.24)
GFR, Estimated: 45 mL/min — ABNORMAL LOW (ref >60)
Glucose, Bld: 242 mg/dL — ABNORMAL HIGH (ref 70–99)
Potassium: 4.8 mmol/L (ref 3.5–5.1)
Sodium: 131 mmol/L — ABNORMAL LOW (ref 135–145)

## 2020-11-12 LAB — BRAIN NATRIURETIC PEPTIDE: B Natriuretic Peptide: 139.6 pg/mL — ABNORMAL HIGH (ref 0.0–100.0)

## 2020-11-12 LAB — CBC
HCT: 44.8 % (ref 39.0–52.0)
Hemoglobin: 14.5 g/dL (ref 13.0–17.0)
MCH: 30.3 pg (ref 26.0–34.0)
MCHC: 32.4 g/dL (ref 30.0–36.0)
MCV: 93.7 fL (ref 80.0–100.0)
Platelets: 250 10*3/uL (ref 150–400)
RBC: 4.78 MIL/uL (ref 4.22–5.81)
RDW: 13.2 % (ref 11.5–15.5)
WBC: 11.2 10*3/uL — ABNORMAL HIGH (ref 4.0–10.5)
nRBC: 0 % (ref 0.0–0.2)

## 2020-11-12 LAB — TROPONIN I (HIGH SENSITIVITY)
Troponin I (High Sensitivity): 33 ng/L — ABNORMAL HIGH (ref <18)
Troponin I (High Sensitivity): 62 ng/L — ABNORMAL HIGH (ref <18)

## 2020-11-12 LAB — RESP PANEL BY RT-PCR (FLU A&B, COVID) ARPGX2
Influenza A by PCR: NEGATIVE
Influenza B by PCR: NEGATIVE
SARS Coronavirus 2 by RT PCR: NEGATIVE

## 2020-11-12 LAB — URINE CULTURE: Culture: NO GROWTH

## 2020-11-12 LAB — MAGNESIUM: Magnesium: 2.1 mg/dL (ref 1.7–2.4)

## 2020-11-12 MED ORDER — ALBUTEROL SULFATE HFA 108 (90 BASE) MCG/ACT IN AERS: 1.0000 | INHALATION_SPRAY | Freq: Four times a day (QID) | RESPIRATORY_TRACT | Status: DC | PRN

## 2020-11-12 MED ORDER — ACETAMINOPHEN 650 MG RE SUPP: 650.0000 mg | Freq: Four times a day (QID) | RECTAL | Status: DC | PRN

## 2020-11-12 MED ORDER — POLYETHYLENE GLYCOL 3350 17 G PO PACK: 17.0000 g | PACK | Freq: Every day | ORAL | Status: DC | PRN

## 2020-11-12 MED ORDER — ONDANSETRON HCL 4 MG PO TABS: 4.0000 mg | ORAL_TABLET | Freq: Four times a day (QID) | ORAL | Status: DC | PRN

## 2020-11-12 MED ORDER — FLECAINIDE ACETATE 50 MG PO TABS
50.0000 mg | ORAL_TABLET | Freq: Two times a day (BID) | ORAL | Status: DC
  Administered 2020-11-13 (×2): 50 mg via ORAL
  Filled 2020-11-12 (×3): qty 1

## 2020-11-12 MED ORDER — HYDROCODONE-ACETAMINOPHEN 5-325 MG PO TABS
1.0000 | ORAL_TABLET | Freq: Four times a day (QID) | ORAL | Status: DC | PRN
  Administered 2020-11-13 (×2): 1 via ORAL
  Filled 2020-11-12 (×2): qty 1

## 2020-11-12 MED ORDER — INSULIN GLARGINE 100 UNIT/ML ~~LOC~~ SOLN
55.0000 [IU] | Freq: Every day | SUBCUTANEOUS | Status: DC
  Administered 2020-11-13: 55 [IU] via SUBCUTANEOUS
  Filled 2020-11-12 (×2): qty 0.55

## 2020-11-12 MED ORDER — RIVAROXABAN 20 MG PO TABS
20.0000 mg | ORAL_TABLET | Freq: Every day | ORAL | Status: DC
  Filled 2020-11-12 (×2): qty 1

## 2020-11-12 MED ORDER — INSULIN ASPART 100 UNIT/ML IJ SOLN
0.0000 [IU] | Freq: Three times a day (TID) | INTRAMUSCULAR | Status: DC
  Administered 2020-11-13: 3 [IU] via SUBCUTANEOUS
  Administered 2020-11-13: 2 [IU] via SUBCUTANEOUS
  Filled 2020-11-12: qty 0.15

## 2020-11-12 MED ORDER — LURASIDONE HCL 40 MG PO TABS
40.0000 mg | ORAL_TABLET | Freq: Every day | ORAL | Status: DC
  Administered 2020-11-13: 40 mg via ORAL
  Filled 2020-11-12: qty 1

## 2020-11-12 MED ORDER — METOPROLOL TARTRATE 50 MG PO TABS
75.0000 mg | ORAL_TABLET | Freq: Two times a day (BID) | ORAL | Status: DC
  Administered 2020-11-13 (×2): 75 mg via ORAL
  Filled 2020-11-12: qty 3
  Filled 2020-11-12: qty 1

## 2020-11-12 MED ORDER — SERTRALINE HCL 100 MG PO TABS
100.0000 mg | ORAL_TABLET | Freq: Every day | ORAL | Status: DC
  Administered 2020-11-13: 100 mg via ORAL
  Filled 2020-11-12: qty 1

## 2020-11-12 MED ORDER — INSULIN ASPART 100 UNIT/ML IJ SOLN
0.0000 [IU] | Freq: Every day | INTRAMUSCULAR | Status: DC
  Filled 2020-11-12: qty 0.05

## 2020-11-12 MED ORDER — ROSUVASTATIN CALCIUM 20 MG PO TABS
40.0000 mg | ORAL_TABLET | Freq: Every day | ORAL | Status: DC
  Administered 2020-11-13: 40 mg via ORAL
  Filled 2020-11-12: qty 2

## 2020-11-12 MED ORDER — ACETAMINOPHEN 325 MG PO TABS: 650.0000 mg | ORAL_TABLET | Freq: Four times a day (QID) | ORAL | Status: DC | PRN

## 2020-11-12 MED ORDER — ONDANSETRON HCL 4 MG/2ML IJ SOLN: 4.0000 mg | Freq: Four times a day (QID) | INTRAMUSCULAR | Status: DC | PRN

## 2020-11-12 MED ORDER — SODIUM CHLORIDE 0.9 % IV BOLUS
500.0000 mL | Freq: Once | INTRAVENOUS | Status: AC
  Administered 2020-11-12: 500 mL via INTRAVENOUS

## 2020-11-12 MED ORDER — MELATONIN 3 MG PO TABS
3.0000 mg | ORAL_TABLET | Freq: Every day | ORAL | Status: DC
  Administered 2020-11-13: 3 mg via ORAL
  Filled 2020-11-12: qty 1

## 2020-11-12 NOTE — ED Provider Notes (Signed)
[facility]-EMERGENCY DEPT Provider Note   CSN: [id number] Arrival date & time: 11/12/20  1624     History Chief Complaint  Patient presents with  . Dizziness    Leroy Chen is a 65 y.o. male.  HPI    Pt comes in w/ cc of dizziness, near fainting. Hx of AF, DM. Reports that post cardioversion, he was not in AF until early this year. Pt went into AF, started on flecainide, but he was back in NSR during subsequent visits. Pt recently had penile implant. Is having hematuria and noted to be in AF again. He has been taking his meds as prescribed. Y'day and todat he has dyspnea on exertion and felt dizzy everytime he got up to walk. He felt like he was going to faint on multiple occasions. No CP, headaches, neck pain, vision change.    Past Medical History:  Diagnosis Date  . Atrial fibrillation [facility]) March 2015  . Diabetes mellitus without complication (HCC)    NIDDM, type II  . High cholesterol   . Hydradenitis   . Sebaceous cyst    hx of  . Sigmoid diverticulitis   . Wears glasses     Patient Active Problem List   Diagnosis Date Noted  . Hyponatremia 11/12/2020  . CKD (chronic kidney disease), stage III (HCC) 11/12/2020  . Diverticulitis large intestine 04/27/2015  . Atrial fibrillation with RVR (HCC) 10/17/2014  . Smoker 10/17/2014  . Demand ischemia from AF with RVR-Troponin 0.07 10/17/2014  . Diverticulitis of large intestine with abscess without bleeding 10/15/2014  . Diverticulitis of intestine with abscess without bleeding 10/12/2014  . Colonic diverticular abscess- s/p colectomy 10/15/14 10/12/2014  . Folliculitis 10/12/2014  . Sepsis (HCC) 10/12/2014  . Diabetes mellitus, type 2 (HCC) 10/12/2014  . Hidradenitis suppurativa 08/02/2012    Past Surgical History:  Procedure Laterality Date  . COLECTOMY  10/15/14  . hand sugery    . ILEO LOOP COLOSTOMY CLOSURE N/A 04/27/2015   Procedure: DIAGNOSTIC LAPAROSCOPY,  EXPLORATORY LAPAROTOMY  WITH RESECTION AND CLOSURE OF COLOSTOMY ;  Surgeon: [name], MD;  Location: [facility] OR;  Service: General;  Laterality: N/A;  . LAPAROTOMY N/A 10/15/2014   Procedure: EXPLORATORY LAPAROTOMY WITH DRAINAGE OF INTRA- ABDOMINAL ABSCESS;  Surgeon: [name], MD;  Location: [facility] OR;  Service: General;  Laterality: N/A;  . ORCHIECTOMY    . PARTIAL COLECTOMY N/A 10/15/2014   Procedure: SIGMOID COLECTOMY WITH COLOSTOMY CREATION;  Surgeon: [name], MD;  Location: [facility] OR;  Service: General;  Laterality: N/A;  . PILONIDAL CYST EXCISION N/A 01/01/2015   Procedure: EXCISION OF PILONIDAL DISEASE;  Surgeon: Romie Levee, MD;  Location: [facility];  Service: General;  Laterality: N/A;       Family History  Problem Relation Age of Onset  . Healthy Mother     Social History   Tobacco Use  . Smoking status: Former Smoker    Packs/day: 0.25    Years: 20.00    Pack years: 5.00    Types: Cigarettes    Quit date: 2018    Years since quitting: 4.3  . Smokeless tobacco: Never Used  Vaping Use  . Vaping Use: Never used  Substance Use Topics  . Alcohol use: Yes    Comment: Social- moderate - one beer at the wedding    . Drug use: No    Home Medications Prior to Admission medications   Medication Sig Start Date End Date Taking? Authorizing Provider  acetaminophen (  TYLENOL) 500 MG tablet Take 1,000 mg by mouth 2 (two) times daily.   Yes [provider]  Adalimumab 40 MG/0.4ML PNKT Inject 40 mg into the skin once a week.   Yes [provider]  albuterol (PROVENTIL HFA;VENTOLIN HFA) 108 (90 Base) MCG/ACT inhaler Inhale 1-2 puffs into the lungs every 6 (six) hours as needed for wheezing or shortness of breath. 08/05/17  Yes Wallis Bamberg, PA-C  Chlorhexidine Gluconate 4 % SOLN Apply 1 application topically See admin instructions. Apply to armpits & Groin 3 times a week in the shower. Leave on for 1 minute and then rinse   Yes [provider]  clindamycin  (CLEOCIN T) 1 % lotion Apply 1 application topically 2 (two) times daily. Draining bumps 03/14/19  Yes [provider]  clobetasol (TEMOVATE) 0.05 % external solution Apply 1 application topically 2 (two) times daily as needed (infection on scalp).   Yes [provider]  dicyclomine (BENTYL) 10 MG capsule Take 10 mg by mouth 3 (three) times daily as needed for spasms.   Yes [provider]  empagliflozin (JARDIANCE) 25 MG TABS tablet Take 12.5 mg by mouth daily.   Yes [provider]  Ensure (ENSURE) Take 237 mLs by mouth daily.   Yes [provider]  ergocalciferol (VITAMIN D2) 1.25 MG (50000 UT) capsule Take 50,000 Units by mouth once a week.   Yes [provider]  flecainide (TAMBOCOR) 50 MG tablet Take 50 mg by mouth 2 (two) times daily.   Yes [provider]  Fluocinolone Acetonide 0.01 % OIL Place 1 application in ear(s) 2 (two) times daily as needed (scaling).   Yes [provider]  HYDROcodone-acetaminophen (NORCO/VICODIN) 5-325 MG tablet Take 1 tablet by mouth every 6 (six) hours as needed for severe pain.   Yes [provider]  ibuprofen (ADVIL) 200 MG tablet Take 600 mg by mouth every 6 (six) hours as needed for moderate pain.   Yes [provider]  insulin glargine (LANTUS) 100 UNIT/ML Solostar Pen Inject 55 Units into the skin every evening. 04/03/19  Yes [provider]  losartan (COZAAR) 50 MG tablet Take 50 mg by mouth daily.   Yes [provider]  lurasidone (LATUDA) 40 MG TABS tablet Take 40 mg by mouth daily.   Yes [provider]  melatonin 3 MG TABS tablet Take 3 mg by mouth at bedtime.   Yes [provider]  metFORMIN (GLUCOPHAGE) 500 MG tablet Take 1,000 mg by mouth 2 (two) times daily with a meal.   Yes [provider]  metoprolol tartrate (LOPRESSOR) 50 MG tablet Take 75 mg by mouth 2 (two) times daily.   Yes [provider]  Multiple  Vitamin (MULTIVITAMIN) tablet Take 1 tablet by mouth daily.   Yes [provider]  omeprazole (PRILOSEC) 20 MG capsule Take 20 mg by mouth 2 (two) times daily as needed (heartburn).   Yes [provider]  rivaroxaban (XARELTO) 20 MG TABS tablet Take 20 mg by mouth daily.   Yes [provider]  rosuvastatin (CRESTOR) 40 MG tablet Take 40 mg by mouth daily.   Yes [provider]  Semaglutide (OZEMPIC, 1 MG/DOSE, [place]) Inject 1 mg into the skin once a week.   Yes [provider]  sertraline (ZOLOFT) 100 MG tablet Take 100 mg by mouth daily.   Yes [provider]  Testosterone 1.62 % GEL Place 4 Pump onto the skin daily. Apply to upper arm or  shoulder areas only   Yes [provider]    Allergies    Penicillins  Review of Systems   Review of Systems  Constitutional: Positive for activity change.  Eyes: Negative for visual disturbance.  Respiratory: Positive for shortness of breath.   Cardiovascular: Negative for chest pain.  Gastrointestinal: Negative for nausea.  Neurological: Positive for dizziness.  All other systems reviewed and are negative.   Physical Exam Updated Vital Signs BP 125/63 (BP Location: Right Arm)   Pulse 83   Temp 97.7 F (36.5 C) (Oral)   Resp (!) 23   Wt 112.8 kg   SpO2 97%   BMI 32.80 kg/m   Physical Exam Vitals and nursing note reviewed.  Constitutional:      Appearance: He is well-developed.  HENT:     Head: Atraumatic.  Cardiovascular:     Rate and Rhythm: Normal rate. Rhythm irregular.  Pulmonary:     Effort: Pulmonary effort is normal.  Musculoskeletal:     Cervical back: Neck supple.     Right lower leg: No edema.     Left lower leg: No edema.  Skin:    General: Skin is warm.  Neurological:     Mental Status: He is alert and oriented to person, place, and time.     Cranial Nerves: No cranial nerve deficit.     Sensory: No sensory deficit.     Motor: No weakness.      Coordination: Coordination normal.     Gait: Gait normal.     Deep Tendon Reflexes: Reflexes normal.     ED Results / Procedures / Treatments   Labs (all labs ordered are listed, but only abnormal results are displayed) Labs Reviewed  BASIC METABOLIC PANEL - Abnormal; Notable for the following components:      Result Value   Sodium 131 (*)    CO2 17 (*)    Glucose, Bld 242 (*)    BUN 34 (*)    Creatinine, Ser 1.69 (*)    Calcium 8.8 (*)    GFR, Estimated 45 (*)    All other components within normal limits  CBC - Abnormal; Notable for the following components:   WBC 11.2 (*)    All other components within normal limits  BRAIN NATRIURETIC PEPTIDE - Abnormal; Notable for the following components:   B Natriuretic Peptide 139.6 (*)    All other components within normal limits  HEMOGLOBIN A1C - Abnormal; Notable for the following components:   Hgb A1c MFr Bld 7.3 (*)    All other components within normal limits  COMPREHENSIVE METABOLIC PANEL - Abnormal; Notable for the following components:   CO2 20 (*)    Glucose, Bld 168 (*)    BUN 36 (*)    Creatinine, Ser 1.54 (*)    Total Bilirubin 2.9 (*)    GFR, Estimated 50 (*)    All other components within normal limits  CBC - Abnormal; Notable for the following components:   WBC 10.7 (*)    All other components within normal limits  OSMOLALITY - Abnormal; Notable for the following components:   Osmolality 304 (*)    All other components within normal limits  GLUCOSE, CAPILLARY - Abnormal; Notable for the following components:   Glucose-Capillary 138 (*)    All other components within normal limits  GLUCOSE, CAPILLARY - Abnormal; Notable for the following components:   Glucose-Capillary 181 (*)    All other components within normal limits  CBG MONITORING,  ED - Abnormal; Notable for the following components:   Glucose-Capillary 147 (*)    All other components within normal limits  TROPONIN I (HIGH SENSITIVITY) - Abnormal;  Notable for the following components:   Troponin I (High Sensitivity) 33 (*)    All other components within normal limits  TROPONIN I (HIGH SENSITIVITY) - Abnormal; Notable for the following components:   Troponin I (High Sensitivity) 62 (*)    All other components within normal limits  TROPONIN I (HIGH SENSITIVITY) - Abnormal; Notable for the following components:   Troponin I (High Sensitivity) 55 (*)    All other components within normal limits  RESP PANEL BY RT-PCR (FLU A&B, COVID) ARPGX2  MRSA PCR SCREENING  MAGNESIUM  MAGNESIUM  PHOSPHORUS  HIV ANTIBODY (ROUTINE TESTING W REFLEX)  SODIUM, URINE, RANDOM  OSMOLALITY, URINE    EKG EKG Interpretation  Date/Time:  Friday November 12 2020 17:07:08 EDT Ventricular Rate:  130 PR Interval:    QRS Duration: 88 QT Interval:  326 QTC Calculation: 479 R Axis:   -47 Text Interpretation: Atrial fibrillation with rapid ventricular response Left axis deviation Cannot rule out Anterior infarct , age undetermined Abnormal ECG AF with RVR Confirmed by Derwood Kaplan [phone]) on 11/12/2020 5:21:51 PM   Radiology MR BRAIN WO CONTRAST  Result Date: 11/12/2020 CLINICAL DATA:  Initial evaluation for acute dizziness. EXAM: MRI HEAD WITHOUT CONTRAST TECHNIQUE: Multiplanar, multiecho pulse sequences of the brain and surrounding structures were obtained without intravenous contrast. COMPARISON:  None available. FINDINGS: Brain: Cerebral volume within normal limits for age. Scattered patchy T2/FLAIR hyperintensities noted involving the periventricular, deep, and subcortical white matter both cerebral hemispheres, nonspecific, but most like related chronic microvascular ischemic disease, overall mild in nature. Focal encephalomalacia and gliosis involving the anterior/inferior left frontal lobe favored to be related to remote traumatic injury (series 11, image 26). No abnormal foci of restricted diffusion to suggest acute or subacute ischemia. Gray-white  matter differentiation maintained. No other areas of encephalomalacia to suggest chronic cortical infarction. No evidence for acute or chronic intracranial hemorrhage. No mass lesion, midline shift or mass effect. No hydrocephalus or extra-axial fluid collection. Pituitary gland suprasellar region normal. Midline structures intact. Vascular: Major intracranial vascular flow voids are maintained. Skull and upper cervical spine: Craniocervical junction normal. Bone marrow signal intensity within normal limits. Multifocal heterogeneity noted about the scalp, most notable posteriorly. Changes are nonspecific, but could reflect chronic scarring. No acute scalp soft tissue abnormality. Sinuses/Orbits: Globes and orbital soft tissues within normal limits paranasal sinuses are largely clear. No mastoid effusion. Inner ear structures grossly normal. Other: None. IMPRESSION: 1. No acute intracranial abnormality. 2. Mild chronic microvascular ischemic disease. 3. Focal encephalomalacia and gliosis involving the anterior/inferior left frontal lobe, favored to be related to remote traumatic injury. Electronically Signed   By: Rise Mu M.D.   On: 11/12/2020 22:59   DG Chest Port 1 View  Result Date: 11/12/2020 CLINICAL DATA:  Short of breath and dizziness since yesterday, atrial fibrillation EXAM: PORTABLE CHEST 1 VIEW COMPARISON:  09/24/2013 FINDINGS: The heart size and mediastinal contours are within normal limits. Both lungs are clear. The visualized skeletal structures are unremarkable. IMPRESSION: No active disease. Electronically Signed   By: Sharlet Salina M.D.   On: 11/12/2020 18:42    Procedures .Critical Care Performed by: Derwood Kaplan, MD Authorized by: Derwood Kaplan, MD   Critical care provider statement:    Critical care time (minutes):  45   Critical care was necessary to treat  or prevent imminent or life-threatening deterioration of the following conditions:  Circulatory failure    Critical care was time spent personally by me on the following activities:  Discussions with consultants, evaluation of patient's response to treatment, examination of patient, ordering and performing treatments and interventions, ordering and review of laboratory studies, ordering and review of radiographic studies, pulse oximetry, re-evaluation of patient's condition, obtaining history from patient or surrogate and review of old charts     Medications Ordered in ED Medications  0.9 %  sodium chloride infusion ( Intravenous New Bag/Given 11/13/20 0140)  sodium chloride 0.9 % bolus 500 mL (0 mLs Intravenous Stopped 11/12/20 2030)  sodium chloride 0.9 % bolus 1,000 mL (1,000 mLs Intravenous New Bag/Given 11/13/20 1136)    ED Course  I have reviewed the triage vital signs and the nursing notes.  Pertinent labs & imaging results that were available during my care of the patient were reviewed by me and considered in my medical decision making (see chart for details).  Clinical Course as of 11/14/20 1314  Fri Nov 12, 2020  2121 About 7:00, we attempted to check orthostatics and ambulate the patient.  Unfortunately patient got dizzy as soon as he got up and did not feel comfortable walking.  I ordered final cc bolus and we will try orthostasis again.  His heart rate has been in good control after he received oral metoprolol.  Patient denies any dizziness when laying flat and has no other neurodeficits or complaints.  I do not think he had a stroke. [AN]  2121 Troponin I (High Sensitivity)(!): 62 Troponin is rising.  No chest pain, but a troponin was ordered for shortness of breath and the BNP, x-ray are reassuring.  I think the troponin elevation is secondary to myocardial injury and not MI.  I have consulted cardiology and Dr. Jacinto HalimGanji will round on the patient tomorrow.  Additionally, we tried to walk the patient again.  As soon as he gets up, he starts feeling dizzy and he was unable to walk  properly.  I will get a CT angiogram of his head and neck and an MRI of the brain to make sure there is no stroke.  Patient will be admitted to the hospital. [AN]    Clinical Course User Index [AN] Derwood KaplanNanavati, Francy Mcilvaine, MD   MDM Rules/Calculators/A&P                          65 year old male with history of A. fib, diabetes, CKD comes in with chief complaint of dizziness, shortness of breath, near fainting.  He has no known history of CAD.  Patient recently had a penile implant.  He was seen in the ER 2 days ago for hematuria.  Since then, he has had some increased dizziness and also shortness of breath with exertion.  The shortness of breath with exertion has been present now for a few days, it is progressing.  No chest pain associated with it.  Hematuria is getting better, but patient is having dizziness anytime he gets up and tries to walk.  He is ataxic when he walks.  With this he does not have any associated vision change, numbness, tingling.  No dizziness when patient turns his head.  The dizziness is not described as vertigo.  Patient states that he has felt like he is going to pass out.  Shortness of breath is exertional and lethargic does not have PND.  Initial thought was that  patient has orthostasis.  He attempted orthostasis evaluation multiple times, but patient would just get dizzy when he gets up and has been unable to walk.  For dizziness, symptoms are not consistent with vertigo therefore it is hard to.  We will get MRI to make sure there is no small stroke.  For the shortness of breath, suspicion is that patient's symptoms are because of symptomatic A. Fib.  No clear evidence of volume overload.  ACS is also in the differential.  Troponins ordered.     Final Clinical Impression(s) / ED Diagnoses Final diagnoses:  Atrial fibrillation with RVR [facility])    Rx / DC Orders ED Discharge Orders         Ordered    Increase activity slowly        11/13/20 1438    Diet Carb  Modified        11/13/20 1438    No wound care        11/13/20 1438    Amb referral to AFIB Clinic        11/12/20 1747           Derwood Kaplan, MD 11/14/20 1318

## 2020-11-12 NOTE — ED Triage Notes (Addendum)
Penile implant done at [facility] 11/03/20; c/o SOB and dizziness started yesterday ; in afib on ekg

## 2020-11-12 NOTE — ED Notes (Signed)
Patient said that he was feeling too dizzy and lightheaded to finish the last portion of the orthostatic vital signs.

## 2020-11-12 NOTE — ED Notes (Signed)
notiifed MD nanavati that patient is in afib RVR

## 2020-11-12 NOTE — ED Notes (Addendum)
Pt c/o dizziness when attempting to stand straight up head forward. Unable to ambulate in hall

## 2020-11-13 ENCOUNTER — Encounter (HOSPITAL_COMMUNITY): Payer: Self-pay | Admitting: Internal Medicine

## 2020-11-13 ENCOUNTER — Other Ambulatory Visit: Payer: Self-pay

## 2020-11-13 DIAGNOSIS — I480 Paroxysmal atrial fibrillation: Secondary | ICD-10-CM | POA: Diagnosis not present

## 2020-11-13 DIAGNOSIS — E1121 Type 2 diabetes mellitus with diabetic nephropathy: Secondary | ICD-10-CM | POA: Diagnosis not present

## 2020-11-13 DIAGNOSIS — R42 Dizziness and giddiness: Secondary | ICD-10-CM

## 2020-11-13 DIAGNOSIS — E871 Hypo-osmolality and hyponatremia: Secondary | ICD-10-CM | POA: Diagnosis not present

## 2020-11-13 DIAGNOSIS — Z20822 Contact with and (suspected) exposure to covid-19: Secondary | ICD-10-CM | POA: Diagnosis not present

## 2020-11-13 DIAGNOSIS — I4891 Unspecified atrial fibrillation: Secondary | ICD-10-CM

## 2020-11-13 DIAGNOSIS — Z794 Long term (current) use of insulin: Secondary | ICD-10-CM

## 2020-11-13 DIAGNOSIS — N1831 Chronic kidney disease, stage 3a: Secondary | ICD-10-CM

## 2020-11-13 DIAGNOSIS — I129 Hypertensive chronic kidney disease with stage 1 through stage 4 chronic kidney disease, or unspecified chronic kidney disease: Secondary | ICD-10-CM | POA: Diagnosis not present

## 2020-11-13 LAB — SODIUM, URINE, RANDOM: Sodium, Ur: 12 mmol/L

## 2020-11-13 LAB — CBG MONITORING, ED: Glucose-Capillary: 147 mg/dL — ABNORMAL HIGH (ref 70–99)

## 2020-11-13 LAB — COMPREHENSIVE METABOLIC PANEL
ALT: 16 U/L (ref 0–44)
AST: 25 U/L (ref 15–41)
Albumin: 3.5 g/dL (ref 3.5–5.0)
Alkaline Phosphatase: 46 U/L (ref 38–126)
Anion gap: 10 (ref 5–15)
BUN: 36 mg/dL — ABNORMAL HIGH (ref 8–23)
CO2: 20 mmol/L — ABNORMAL LOW (ref 22–32)
Calcium: 8.9 mg/dL (ref 8.9–10.3)
Chloride: 108 mmol/L (ref 98–111)
Creatinine, Ser: 1.54 mg/dL — ABNORMAL HIGH (ref 0.61–1.24)
GFR, Estimated: 50 mL/min — ABNORMAL LOW (ref >60)
Glucose, Bld: 168 mg/dL — ABNORMAL HIGH (ref 70–99)
Potassium: 4.7 mmol/L (ref 3.5–5.1)
Sodium: 138 mmol/L (ref 135–145)
Total Bilirubin: 2.9 mg/dL — ABNORMAL HIGH (ref 0.3–1.2)
Total Protein: 7.5 g/dL (ref 6.5–8.1)

## 2020-11-13 LAB — TROPONIN I (HIGH SENSITIVITY): Troponin I (High Sensitivity): 55 ng/L — ABNORMAL HIGH (ref <18)

## 2020-11-13 LAB — CBC
HCT: 41 % (ref 39.0–52.0)
Hemoglobin: 13 g/dL (ref 13.0–17.0)
MCH: 30 pg (ref 26.0–34.0)
MCHC: 31.7 g/dL (ref 30.0–36.0)
MCV: 94.7 fL (ref 80.0–100.0)
Platelets: 227 10*3/uL (ref 150–400)
RBC: 4.33 MIL/uL (ref 4.22–5.81)
RDW: 13.2 % (ref 11.5–15.5)
WBC: 10.7 10*3/uL — ABNORMAL HIGH (ref 4.0–10.5)
nRBC: 0 % (ref 0.0–0.2)

## 2020-11-13 LAB — GLUCOSE, CAPILLARY
Glucose-Capillary: 138 mg/dL — ABNORMAL HIGH (ref 70–99)
Glucose-Capillary: 181 mg/dL — ABNORMAL HIGH (ref 70–99)

## 2020-11-13 LAB — MAGNESIUM: Magnesium: 2.2 mg/dL (ref 1.7–2.4)

## 2020-11-13 LAB — OSMOLALITY, URINE: Osmolality, Ur: 640 mOsm/kg (ref 300–900)

## 2020-11-13 LAB — HEMOGLOBIN A1C
Hgb A1c MFr Bld: 7.3 % — ABNORMAL HIGH (ref 4.8–5.6)
Mean Plasma Glucose: 162.81 mg/dL

## 2020-11-13 LAB — OSMOLALITY: Osmolality: 304 mOsm/kg — ABNORMAL HIGH (ref 275–295)

## 2020-11-13 LAB — MRSA PCR SCREENING: MRSA by PCR: NEGATIVE

## 2020-11-13 LAB — PHOSPHORUS: Phosphorus: 3.6 mg/dL (ref 2.5–4.6)

## 2020-11-13 LAB — HIV ANTIBODY (ROUTINE TESTING W REFLEX): HIV Screen 4th Generation wRfx: NONREACTIVE

## 2020-11-13 MED ORDER — RIVAROXABAN 15 MG PO TABS: 15.0000 mg | ORAL_TABLET | Freq: Every day | ORAL | Status: DC

## 2020-11-13 MED ORDER — SODIUM CHLORIDE 0.9 % IV BOLUS
1000.0000 mL | Freq: Once | INTRAVENOUS | Status: AC
  Administered 2020-11-13: 1000 mL via INTRAVENOUS

## 2020-11-13 MED ORDER — SODIUM CHLORIDE 0.9 % IV SOLN: INTRAVENOUS | Status: AC

## 2020-11-13 NOTE — ED Notes (Signed)
Report given to [facility], RN on 4W

## 2020-11-13 NOTE — Progress Notes (Signed)
Nurse reviewed discharge instructions for pt.  Pt verbalized understanding of discharge instructions and follow up appointments. No prescriptions written for pt.  No concerns at time of discharge.

## 2020-11-13 NOTE — Consult Note (Addendum)
CARDIOLOGY CONSULT NOTE  Patient ID: Leroy Chen MRN: [id number] DOB/AGE: September 05, 1955 64 y.o.  Admit date: 11/12/2020 Referring Physician  Varney Biles Primary Physician:  [facility], [facility] Reason for Consultation  A. Fib  Patient ID: Leroy Chen, male    DOB: September 27, 1955, 65 y.o.   MRN: [id number]  Chief Complaint  Patient presents with  . Dizziness   HPI:    Leroy Chen  is a 65 y.o. African-American male with a diagnosis of paroxysmal atrial fibrillation diagnosed in 2015, hypertension, hyperlipidemia, obstructive sleep apnea on CPAP, diabetes mellitus, who recently had renal transplant, presented to the emergency room with hematuria.  I was consulted because patient was in A. fib with RVR.  Patient remains asymptomatic with regard to atrial fibrillation without dyspnea or chest pain.  He was previously on flecainide and also on Xarelto for the same that was started after the last office visit with his cardiologist on 02/19/2018 and at that time felt his atrial fibrillation was probably permanent.  I was consulted for management of atrial fibrillation.  On further questioning, patient is now being followed by [facility] cardiologist instead of [facility]ology, patient has been in sinus rhythm and 3 months ago he wore a extended EKG monitoring and was told to have sinus rhythm again.  He was off of anticoagulation since 18 April for penile implant that was performed 3 days later and back on Xarelto since 21st April.  This morning he feels well, urine has cleared, wife is present at the bedside.  He has no specific complaints today.  Past Medical History:  Diagnosis Date  . Atrial fibrillation [facility]) March 2015  . Diabetes mellitus without complication (HCC)    NIDDM, type II  . High cholesterol   . Hydradenitis   . Sebaceous cyst    hx of  . Sigmoid diverticulitis   . Wears glasses    Past Surgical History:  Procedure Laterality Date  . COLECTOMY  10/15/14  . hand  sugery    . ILEO LOOP COLOSTOMY CLOSURE N/A 04/27/2015   Procedure: DIAGNOSTIC LAPAROSCOPY,  EXPLORATORY LAPAROTOMY WITH RESECTION AND CLOSURE OF COLOSTOMY ;  Surgeon: Fanny Skates, MD;  Location: [place];  Service: General;  Laterality: N/A;  . LAPAROTOMY N/A 10/15/2014   Procedure: EXPLORATORY LAPAROTOMY WITH DRAINAGE OF INTRA- ABDOMINAL ABSCESS;  Surgeon: Fanny Skates, MD;  Location: [place];  Service: General;  Laterality: N/A;  . ORCHIECTOMY    . PARTIAL COLECTOMY N/A 10/15/2014   Procedure: SIGMOID COLECTOMY WITH COLOSTOMY CREATION;  Surgeon: Fanny Skates, MD;  Location: [place];  Service: General;  Laterality: N/A;  . PILONIDAL CYST EXCISION N/A 01/01/2015   Procedure: EXCISION OF PILONIDAL DISEASE;  Surgeon: Leighton Ruff, MD;  Location: [place];  Service: General;  Laterality: N/A;   Social History   Tobacco Use  . Smoking status: Former Smoker    Packs/day: 0.25    Years: 20.00    Pack years: 5.00    Types: Cigarettes    Quit date: 2018    Years since quitting: 4.3  . Smokeless tobacco: Never Used  Substance Use Topics  . Alcohol use: Yes    Comment: Social- moderate - one beer at the wedding      Family History  Problem Relation Age of Onset  . Healthy Mother     Marital Sttus: Married  ROS  Review of Systems  Constitutional: Negative.  Cardiovascular: Negative for chest pain, dyspnea on exertion and leg swelling.  Respiratory: Positive for snoring (On CPAP but not being compliant.).   Endocrine: Negative.   Skin: Negative.   Gastrointestinal: Negative for melena.  Genitourinary: Positive for hematuria.  Neurological: Negative.   Psychiatric/Behavioral: Negative.   All other systems reviewed and are negative.  Objective   Vitals with BMI 11/13/2020 11/13/2020 11/13/2020  Height - - -  Weight - - -  BMI - - -  Systolic 798 921 194  Diastolic 65 91 71  Pulse 92 84 86    Blood pressure 127/65, pulse 92, temperature 97.6 F (36.4 C),  temperature source Oral, resp. rate 19, SpO2 100 %.    Physical Exam Constitutional:      Appearance: He is obese.  HENT:     Head: Atraumatic.     Mouth/Throat:     Mouth: Mucous membranes are moist.  Eyes:     Extraocular Movements: Extraocular movements intact.  Neck:     Vascular: No carotid bruit or JVD.  Cardiovascular:     Rate and Rhythm: Normal rate and regular rhythm.     Pulses: Intact distal pulses.     Heart sounds: Normal heart sounds. No murmur heard. No gallop.   Pulmonary:     Effort: Pulmonary effort is normal.     Breath sounds: Normal breath sounds.  Abdominal:     General: Bowel sounds are normal.     Palpations: Abdomen is soft.  Musculoskeletal:        General: No swelling. Normal range of motion.     Cervical back: Normal range of motion.     Right lower leg: No edema.     Left lower leg: No edema.  Skin:    General: Skin is warm and dry.  Neurological:     General: No focal deficit present.    Laboratory examination:    Recent Labs    11/10/20 1212 11/12/20 1719 11/13/20 0400  NA 139 131* 138  K 5.4* 4.8 4.7  CL 102 99 108  CO2 20* 17* 20*  GLUCOSE 144* 242* 168*  BUN 31* 34* 36*  CREATININE 1.84* 1.69* 1.54*  CALCIUM 9.6 8.8* 8.9  GFRNONAA 40* 45* 50*   CrCl cannot be calculated (Unknown ideal weight.).  CMP Latest Ref Rng & Units 11/13/2020 11/12/2020 11/10/2020  Glucose 70 - 99 mg/dL 168(H) 242(H) 144(H)  BUN 8 - 23 mg/dL 36(H) 34(H) 31(H)  Creatinine 0.61 - 1.24 mg/dL 1.54(H) 1.69(H) 1.84(H)  Sodium 135 - 145 mmol/L 138 131(L) 139  Potassium 3.5 - 5.1 mmol/L 4.7 4.8 5.4(H)  Chloride 98 - 111 mmol/L 108 99 102  CO2 22 - 32 mmol/L 20(L) 17(L) 20(L)  Calcium 8.9 - 10.3 mg/dL 8.9 8.8(L) 9.6  Total Protein 6.5 - 8.1 g/dL 7.5 - -  Total Bilirubin 0.3 - 1.2 mg/dL 2.9(H) - -  Alkaline Phos 38 - 126 U/L 46 - -  AST 15 - 41 U/L 25 - -  ALT 0 - 44 U/L 16 - -   CBC Latest Ref Rng & Units 11/13/2020 11/12/2020 11/10/2020  WBC 4.0 - 10.5  K/uL 10.7(H) 11.2(H) 11.1(H)  Hemoglobin 13.0 - 17.0 g/dL 13.0 14.5 14.8  Hematocrit 39.0 - 52.0 % 41.0 44.8 46.4  Platelets 150 - 400 K/uL 227 250 176    Lipid Panel No results for input(s): CHOL, TRIG, LDLCALC, VLDL, HDL, CHOLHDL, LDLDIRECT in the last 8760 hours.  HEMOGLOBIN A1C Lab Results  Component Value Date   HGBA1C 8.0 (H) 04/27/2015   MPG  183 04/27/2015   TSH No results for input(s): TSH in the last 8760 hours. BNP (last 3 results) Recent Labs    11/12/20 1719  BNP 139.6*    Component Ref Range & Units 04:00  (11/13/20) 1 d ago  (11/12/20) 1 d ago  (11/12/20)  Troponin I (High Sensitivity) <18 ng/L 55High  62High CM  33High CM   Comment: (NOTE)       External labs:   Labs 10/20/2020:  Serum creatinine 1.550, BUN 28, serum glucose 120, potassium 5.0.  eGFR 50 mL.  Total cholesterol 179, triglycerides 126, HDL 46, LDL 95.  A1c 7.0%.   Medications and allergies   Allergies  Allergen Reactions  . Penicillins Swelling    No current facility-administered medications on file prior to encounter.   Current Outpatient Medications on File Prior to Encounter  Medication Sig Dispense Refill  . acetaminophen (TYLENOL) 500 MG tablet Take 1,000 mg by mouth 2 (two) times daily.    . Adalimumab 40 MG/0.4ML PNKT Inject 40 mg into the skin once a week.    Marland Kitchen albuterol (PROVENTIL HFA;VENTOLIN HFA) 108 (90 Base) MCG/ACT inhaler Inhale 1-2 puffs into the lungs every 6 (six) hours as needed for wheezing or shortness of breath. 18 g 0  . cephALEXin (KEFLEX) 500 MG capsule Take 1 capsule (500 mg total) by mouth 3 (three) times daily. 21 capsule 0  . Chlorhexidine Gluconate 4 % SOLN Apply 1 application topically See admin instructions. Apply to armpits & Groin 3 times a week in the shower. Leave on for 1 minute and then rinse    . clindamycin (CLEOCIN T) 1 % lotion Apply 1 application topically 2 (two) times daily. Draining bumps    . clobetasol (TEMOVATE) 0.05 % external  solution Apply 1 application topically 2 (two) times daily as needed (infection on scalp).    Marland Kitchen dicyclomine (BENTYL) 10 MG capsule Take 10 mg by mouth 3 (three) times daily as needed for spasms.    . empagliflozin (JARDIANCE) 25 MG TABS tablet Take 12.5 mg by mouth daily.    . Ensure (ENSURE) Take 237 mLs by mouth daily.    . ergocalciferol (VITAMIN D2) 1.25 MG (50000 UT) capsule Take 50,000 Units by mouth once a week.    . flecainide (TAMBOCOR) 50 MG tablet Take 50 mg by mouth 2 (two) times daily.    . Fluocinolone Acetonide 0.01 % OIL Place 1 application in ear(s) 2 (two) times daily as needed (scaling).    Marland Kitchen HYDROcodone-acetaminophen (NORCO/VICODIN) 5-325 MG tablet Take 1 tablet by mouth every 6 (six) hours as needed for severe pain.    Marland Kitchen ibuprofen (ADVIL) 200 MG tablet Take 600 mg by mouth every 6 (six) hours as needed for moderate pain.    Marland Kitchen insulin glargine (LANTUS) 100 UNIT/ML Solostar Pen Inject 55 Units into the skin every evening.    Marland Kitchen losartan (COZAAR) 50 MG tablet Take 50 mg by mouth daily.    Marland Kitchen lurasidone (LATUDA) 40 MG TABS tablet Take 40 mg by mouth daily.    . melatonin 3 MG TABS tablet Take 3 mg by mouth at bedtime.    . metFORMIN (GLUCOPHAGE) 500 MG tablet Take 1,000 mg by mouth 2 (two) times daily with a meal.    . metoprolol tartrate (LOPRESSOR) 50 MG tablet Take 75 mg by mouth 2 (two) times daily.    . Multiple Vitamin (MULTIVITAMIN) tablet Take 1 tablet by mouth daily.    Marland Kitchen omeprazole (PRILOSEC) 20 MG  capsule Take 20 mg by mouth 2 (two) times daily as needed (heartburn).    . rivaroxaban (XARELTO) 20 MG TABS tablet Take 20 mg by mouth daily.    . rosuvastatin (CRESTOR) 40 MG tablet Take 40 mg by mouth daily.    . Semaglutide (OZEMPIC, 1 MG/DOSE, Dawn) Inject 1 mg into the skin once a week.    . sertraline (ZOLOFT) 100 MG tablet Take 100 mg by mouth daily.    . Testosterone 1.62 % GEL Place 4 Pump onto the skin daily. Apply to upper arm or shoulder areas only    .  sulfamethoxazole-trimethoprim (BACTRIM DS) 800-160 MG tablet Take 1 tablet by mouth 2 (two) times daily. Start date : 11/03/20       Scheduled Meds: . flecainide  50 mg Oral BID  . insulin aspart  0-15 Units Subcutaneous TID WC  . insulin aspart  0-5 Units Subcutaneous QHS  . insulin glargine  55 Units Subcutaneous QHS  . lurasidone  40 mg Oral Daily  . melatonin  3 mg Oral QHS  . metoprolol tartrate  75 mg Oral BID  . rivaroxaban  20 mg Oral QPC breakfast  . rosuvastatin  40 mg Oral Daily  . sertraline  100 mg Oral Daily   Continuous Infusions: . sodium chloride 75 mL/hr at 11/13/20 0140   PRN Meds:.acetaminophen **OR** acetaminophen, albuterol, HYDROcodone-acetaminophen, ondansetron **OR** ondansetron (ZOFRAN) IV, polyethylene glycol   I/O last 3 completed shifts: In: 38 [P.O.:360; I.V.:400] Out: -  Total I/O In: 240 [P.O.:240] Out: 65 [Urine:450]    Radiology:    Cardiac Studies:    Echocardiogram 10/19/2014: - Left ventricle: The cavity size was normal. Wall thickness was  increased in a pattern of mild LVH. Systolic function was normal.  The estimated ejection fraction was in the range of 55% to 60%.  Left ventricular diastolic function parameters were normal.  - Left atrium: The atrium was mildly dilated.    Echo 11/2016 ejection fraction of 60-65%, moderate LVH, stage II diastolic dysfunction, 1+ MR   EKG:  EKG 11/12/2020: Atrial fibrillation with rapid ventricle response at rate of 130 bpm, left axis deviation, left intrafascicular block.  Poor R progression, cannot exclude anteroseptal infarct old.  Assessment   Leroy Chen is a 65 y.o. African-American male with a diagnosis of paroxysmal atrial fibrillation diagnosed in 2015, hypertension, hyperlipidemia, obstructive sleep apnea on CPAP, diabetes mellitus, who recently had renal transplant, presented to the emergency room with hematuria.  I was consulted because patient was in A. fib with  RVR.  Patient remains asymptomatic with regard to atrial fibrillation without dyspnea or chest pain.  He was previously on flecainide and also on Xarelto for the same that was started after the last office visit with his cardiologist on 02/19/2018 and at that time felt his atrial fibrillation was probably permanent.  I was consulted for management of atrial fibrillation.  However patient has had further follow-up with the [place], records not available to me but patient clearly remembers being in sinus rhythm and wore a event monitor just 3 months ago.  1.  Paroxysmal atrial fibrillation with RVR CHA2DS2-VASc Score is 2.  Yearly risk of stroke: 2.3% (HTN, DM).  Score of 1=0.6; 2=2.2; 3=3.2; 4=4.8; 5=7.2; 6=9.8; 7=>9.8) -(CHF; HTN; vasc disease DM,  Male = 1; Age <65 =0; 65-74 = 1,  >75 =2; stroke/embolism= 2).   2.  Primary hypertension 3.  Diabetes mellitus type 2 controlled without hypoglycemia, with stage IIIa chronic  kidney disease. 4.  Type II MI  Recommendations:   Patient is back in sinus rhythm.  He is tolerating anticoagulation, his urine has completely cleared without any further hematuria.  He was taking Xarelto in the morning, advised him to switch it to the evening dose after dinner.  He probably needs 15 mg of Xarelto in the evening and in view of chronic renal insufficiency.  Blood pressure is well controlled, his lipids reveal mild elevated triglycerides probably related to diabetes and poor eating habits, patient is willing to make changes.  LDL is 95, in view of diabetes mellitus, goal LDL would be closer to 70, would consider adding Zetia if diet modification does not work over the next few months.  He is presently on Crestor max dose.  Do not think he has acute coronary syndrome.  He is back in sinus rhythm this morning.  He is presently following with the [facility] cardiology, I given him my information if he chooses to change cardiology practices, I will be happy to see him back in the  office.  He has had a nuclear stress test about 2 years ago, I could not find the report but it was told to be normal.  He has no known coronary disease.   Adrian Prows, MD, [facility] 11/13/2020, 9:11 AM Office: [phone]

## 2020-11-13 NOTE — Progress Notes (Signed)
   11/13/20 0917 11/13/20 0921 11/13/20 0923  Vitals  BP (!) 116/59 111/71 117/72  MAP (mmHg) 75 82 85  BP Location Right Arm  --   --   BP Method Automatic Automatic Automatic  Patient Position (if appropriate) Lying Sitting Standing  Pulse Rate 88 93 (!) 104  Pulse Rate Source Monitor Monitor Monitor  Level of Consciousness  Level of Consciousness Alert  --  Alert  MEWS COLOR  MEWS Score Color Green Green Green  Orthostatic Lying   BP- Lying 116/59 111/71 117/72  Pulse- Lying 88 93 104  Orthostatic vital signs obtained. Endorsed mild dizziness. Otherwise patient endorses feeling fine. Wanted to use bathroom,encouraged use of bedside commode, since still endorsing some dizziness, patient endorsed wanted to use the bathroom. Assisted to the bathroom with 1 assist. No other needs identified. Report given to oncoming nurse, Bri RN.

## 2020-11-13 NOTE — Discharge Summary (Signed)
Physician Discharge Summary   Leroy Chen:[id number] DOB: 1955/11/29 DOA: 11/12/2020  PCP: [name]  Admit date: 11/12/2020 Discharge date:  11/13/2020  Admitted From: home Disposition:  home Discharging physician: Lewie Chamber, MD  Recommendations for Outpatient Follow-up:  1. [facility]   Patient discharged to home in Discharge Condition: stable Risk of unplanned readmission score:    CODE STATUS: Full Diet recommendation:  Diet Orders (From admission, onward)    Start     Ordered   11/13/20 0000  Diet Carb Modified        11/13/20 1438   11/12/20 2332  Diet Carb Modified Fluid consistency: Thin; Room service appropriate? Yes  Diet effective now       Question Answer Comment  Diet-HS Snack? Nothing   Calorie Level Medium 1600-2000   Fluid consistency: Thin   Room service appropriate? Yes      11/12/20 2332          Hospital Course: Leroy Chen is a 65 y.o. male, with PMH of paroxysmal A. fib, insulin-dependent type 2 diabetes, hypertension, recent penile implant, who presented to the ER on 11/12/2020 with dizziness upon standing.  He had endorsed feeling weak with standing and associated dizziness.  [facility]  He has had hematuria following the procedure which has been slowly improving.  Hemoglobin was also stable and normal on admission.  He has tried remaining hydrated postop as well. On presentation he was found to be in A. fib with RVR.  He has been cardioverted in the past but is rhythm unaware in general.  He is compliant on home medications without missing any recent doses. He was started on IV fluids and continued on his home regimen.  He converted spontaneously back to normal sinus rhythm overnight with controlled rate in the morning.  Cardiology was also consulted on admission.  He required no medication changes.  His weight was obtained and his creatinine clearance  was considered appropriate for continuing his home dose of Xarelto.  He was continued on his home regimen also of flecainide and Toprol for rate and rhythm control. He underwent orthostatic blood pressure check prior to discharge as well.  Blood pressures remained stable, his heart rate did increase some.  He was given a repeat fluid bolus prior to discharge.  He was cleared by cardiology as well for discharge.  He will follow-up for routine maintenance with his cardiologist at the [place].   The patient's chronic medical conditions were treated accordingly per the patient's home medication regimen except as noted.  On day of discharge, patient was felt deemed stable for discharge. Patient/family member advised to call PCP or come back to ER if needed.   Principal Diagnosis: Atrial fibrillation with RVR [facility])  Discharge Diagnoses: Active Hospital Problems   Diagnosis Date Noted  . Atrial fibrillation with RVR (HCC) 10/17/2014    Priority: High  . Hyponatremia 11/12/2020  . CKD (chronic kidney disease), stage III (HCC) 11/12/2020  . Diabetes mellitus, type 2 (HCC) 10/12/2014    Resolved Hospital Problems   Diagnosis Date Noted Date Resolved  . Orthostatic dizziness 11/12/2020 11/13/2020    Priority: Medium    Discharge Instructions    Amb referral to [facility]   Complete by: As directed    Diet Carb Modified   Complete by: As directed    Increase activity slowly   Complete by: As directed  No wound care   Complete by: As directed      Allergies as of 11/13/2020      Reactions   Penicillins Swelling      Medication List    STOP taking these medications   cephALEXin 500 MG capsule Commonly known as: KEFLEX   sulfamethoxazole-trimethoprim 800-160 MG tablet Commonly known as: BACTRIM DS     TAKE these medications   acetaminophen 500 MG tablet Commonly known as: TYLENOL Take 1,000 mg by mouth 2 (two) times daily.   Adalimumab 40 MG/0.4ML Pnkt Inject 40 mg into the skin  once a week.   albuterol 108 (90 Base) MCG/ACT inhaler Commonly known as: VENTOLIN HFA Inhale 1-2 puffs into the lungs every 6 (six) hours as needed for wheezing or shortness of breath.   Chlorhexidine Gluconate 4 % Soln Apply 1 application topically See admin instructions. Apply to armpits & Groin 3 times a week in the shower. Leave on for 1 minute and then rinse   clindamycin 1 % lotion Commonly known as: CLEOCIN T Apply 1 application topically 2 (two) times daily. Draining bumps   clobetasol 0.05 % external solution Commonly known as: TEMOVATE Apply 1 application topically 2 (two) times daily as needed (infection on scalp).   dicyclomine 10 MG capsule Commonly known as: BENTYL Take 10 mg by mouth 3 (three) times daily as needed for spasms.   empagliflozin 25 MG Tabs tablet Commonly known as: JARDIANCE Take 12.5 mg by mouth daily.   Ensure Take 237 mLs by mouth daily.   ergocalciferol 1.25 MG (50000 UT) capsule Commonly known as: VITAMIN D2 Take 50,000 Units by mouth once a week.   flecainide 50 MG tablet Commonly known as: TAMBOCOR Take 50 mg by mouth 2 (two) times daily.   Fluocinolone Acetonide 0.01 % Oil Place 1 application in ear(s) 2 (two) times daily as needed (scaling).   HYDROcodone-acetaminophen 5-325 MG tablet Commonly known as: NORCO/VICODIN Take 1 tablet by mouth every 6 (six) hours as needed for severe pain.   ibuprofen 200 MG tablet Commonly known as: ADVIL Take 600 mg by mouth every 6 (six) hours as needed for moderate pain.   insulin glargine 100 UNIT/ML Solostar Pen Commonly known as: LANTUS Inject 55 Units into the skin every evening.   losartan 50 MG tablet Commonly known as: COZAAR Take 50 mg by mouth daily.   lurasidone 40 MG Tabs tablet Commonly known as: LATUDA Take 40 mg by mouth daily.   melatonin 3 MG Tabs tablet Take 3 mg by mouth at bedtime.   metFORMIN 500 MG tablet Commonly known as: GLUCOPHAGE Take 1,000 mg by mouth 2  (two) times daily with a meal.   metoprolol tartrate 50 MG tablet Commonly known as: LOPRESSOR Take 75 mg by mouth 2 (two) times daily.   multivitamin tablet Take 1 tablet by mouth daily.   omeprazole 20 MG capsule Commonly known as: PRILOSEC Take 20 mg by mouth 2 (two) times daily as needed (heartburn).   OZEMPIC (1 MG/DOSE) [place] Inject 1 mg into the skin once a week.   rivaroxaban 20 MG Tabs tablet Commonly known as: XARELTO Take 20 mg by mouth daily.   rosuvastatin 40 MG tablet Commonly known as: CRESTOR Take 40 mg by mouth daily.   sertraline 100 MG tablet Commonly known as: ZOLOFT Take 100 mg by mouth daily.   Testosterone 1.62 % Gel Place 4 Pump onto the skin daily. Apply to upper arm or shoulder areas only  Follow-up Information    Yates Decamp, MD. Call.   Specialty: Cardiology Why: If you would like to establish care with me. Thanks Contact information: [address] [place] [address] [phone]              Allergies  Allergen Reactions  . Penicillins Swelling    Consultations: Cardiology  Discharge Exam: BP 125/63 (BP Location: Right Arm)   Pulse 83   Temp 97.7 F (36.5 C) (Oral)   Resp (!) 23   Wt 112.8 kg   SpO2 97%   BMI 32.80 kg/m  General appearance: alert, cooperative and no distress Head: Normocephalic, without obvious abnormality, atraumatic Eyes: EOMI Lungs: clear to auscultation bilaterally Heart: regular rate and rhythm and S1, S2 normal Abdomen: soft, NT, ND, BS present Extremities: no edema Skin: mobility and turgor normal Neurologic: Grossly normal  The results of significant diagnostics from this hospitalization (including imaging, microbiology, ancillary and laboratory) are listed below for reference.   Microbiology: Recent Results (from the past 240 hour(s))  Urine culture     Status: None   Collection Time: 11/10/20 12:53 PM   Specimen: Urine, Random  Result Value Ref Range Status    Specimen Description   Final    URINE, RANDOM Performed at [facility], [address], [place], [place] [address]    Special Requests   Final    NONE Performed at [facility], [address], [place], [place] [address]    Culture   Final    NO GROWTH Performed at [facility], [address] [place], [place] [address]    Report Status 11/12/2020 FINAL  Final  Resp Panel by RT-PCR (Flu A&B, Covid) Nasopharyngeal Swab     Status: None   Collection Time: 11/12/20  8:37 PM   Specimen: Nasopharyngeal Swab; Nasopharyngeal(NP) swabs in vial transport medium  Result Value Ref Range Status   SARS Coronavirus 2 by RT PCR NEGATIVE NEGATIVE Final    Comment: (NOTE) SARS-CoV-2 target nucleic acids are NOT DETECTED.  The SARS-CoV-2 RNA is generally detectable in upper respiratory specimens during the acute phase of infection. The lowest concentration of SARS-CoV-2 viral copies this assay can detect is 138 copies/mL. A negative result does not preclude SARS-Cov-2 infection and should not be used as the sole basis for treatment or other patient management decisions. A negative result may occur with  improper specimen collection/handling, submission of specimen other than nasopharyngeal swab, presence of viral mutation(s) within the areas targeted by this assay, and inadequate number of viral copies(<138 copies/mL). A negative result must be combined with clinical observations, patient history, and epidemiological information. The expected result is Negative.  Fact Sheet for Patients:  BloggerCourse.com  Fact Sheet for Healthcare Providers:  SeriousBroker.it  This test is no t yet approved or cleared by the [place] FDA and  has been authorized for detection and/or diagnosis of SARS-CoV-2 by FDA under an Emergency Use Authorization (EUA). This EUA will remain  in effect (meaning this test  can be used) for the duration of the COVID-19 declaration under Section 564(b)(1) of the Act, 21 U.S.C.section 360bbb-3(b)(1), unless the authorization is terminated  or revoked sooner.       Influenza A by PCR NEGATIVE NEGATIVE Final   Influenza B by PCR NEGATIVE NEGATIVE Final    Comment: (NOTE) The Xpert Xpress SARS-CoV-2/FLU/RSV plus assay is intended as an aid in the diagnosis of influenza from Nasopharyngeal swab specimens and  should not be used as a sole basis for treatment. Nasal washings and aspirates are unacceptable for Xpert Xpress SARS-CoV-2/FLU/RSV testing.  Fact Sheet for Patients: BloggerCourse.com  Fact Sheet for Healthcare Providers: SeriousBroker.it  This test is not yet approved or cleared by the [place] FDA and has been authorized for detection and/or diagnosis of SARS-CoV-2 by FDA under an Emergency Use Authorization (EUA). This EUA will remain in effect (meaning this test can be used) for the duration of the COVID-19 declaration under Section 564(b)(1) of the Act, 21 U.S.C. section 360bbb-3(b)(1), unless the authorization is terminated or revoked.  Performed at [facility], [address] [place], [place] [address]   MRSA PCR Screening     Status: None   Collection Time: 11/13/20  6:23 AM   Specimen: Nasal Mucosa; Nasopharyngeal  Result Value Ref Range Status   MRSA by PCR NEGATIVE NEGATIVE Final    Comment:        The GeneXpert MRSA Assay (FDA approved for NASAL specimens only), is one component of a comprehensive MRSA colonization surveillance program. It is not intended to diagnose MRSA infection nor to guide or monitor treatment for MRSA infections. Performed at [facility], [address] [place], [place] [address]      Labs: BNP (last 3 results) Recent Labs    11/12/20 1719  BNP 139.6*   Basic Metabolic Panel: Recent Labs  Lab  11/10/20 1212 11/12/20 1719 11/13/20 0400  NA 139 131* 138  K 5.4* 4.8 4.7  CL 102 99 108  CO2 20* 17* 20*  GLUCOSE 144* 242* 168*  BUN 31* 34* 36*  CREATININE 1.84* 1.69* 1.54*  CALCIUM 9.6 8.8* 8.9  MG  --  2.1 2.2  PHOS  --   --  3.6   Liver Function Tests: Recent Labs  Lab 11/13/20 0400  AST 25  ALT 16  ALKPHOS 46  BILITOT 2.9*  PROT 7.5  ALBUMIN 3.5   No results for input(s): LIPASE, AMYLASE in the last 168 hours. No results for input(s): AMMONIA in the last 168 hours. CBC: Recent Labs  Lab 11/10/20 1212 11/12/20 1719 11/13/20 0400  WBC 11.1* 11.2* 10.7*  NEUTROABS 7.7  --   --   HGB 14.8 14.5 13.0  HCT 46.4 44.8 41.0  MCV 94.7 93.7 94.7  PLT 176 250 227   Cardiac Enzymes: No results for input(s): CKTOTAL, CKMB, CKMBINDEX, TROPONINI in the last 168 hours. BNP: Invalid input(s): POCBNP CBG: Recent Labs  Lab 11/13/20 0021 11/13/20 0755 11/13/20 1126  GLUCAP 147* 138* 181*   D-Dimer No results for input(s): DDIMER in the last 72 hours. Hgb A1c Recent Labs    11/13/20 0400  HGBA1C 7.3*   Lipid Profile No results for input(s): CHOL, HDL, LDLCALC, TRIG, CHOLHDL, LDLDIRECT in the last 72 hours. Thyroid function studies No results for input(s): TSH, T4TOTAL, T3FREE, THYROIDAB in the last 72 hours.  Invalid input(s): FREET3 Anemia work up No results for input(s): VITAMINB12, FOLATE, FERRITIN, TIBC, IRON, RETICCTPCT in the last 72 hours. Urinalysis    Component Value Date/Time   COLORURINE YELLOW 11/10/2020 1253   APPEARANCEUR HAZY (A) 11/10/2020 1253   LABSPEC 1.025 11/10/2020 1253   PHURINE 5.0 11/10/2020 1253   GLUCOSEU >=500 (A) 11/10/2020 1253   HGBUR LARGE (A) 11/10/2020 1253   BILIRUBINUR NEGATIVE 11/10/2020 1253   KETONESUR 20 (A) 11/10/2020 1253   PROTEINUR 100 (A) 11/10/2020 1253   UROBILINOGEN 0.2 04/20/2015 1628   NITRITE NEGATIVE  11/10/2020 1253   LEUKOCYTESUR NEGATIVE 11/10/2020 1253   Sepsis Labs Invalid input(s):  PROCALCITONIN,  WBC,  LACTICIDVEN Microbiology Recent Results (from the past 240 hour(s))  Urine culture     Status: None   Collection Time: 11/10/20 12:53 PM   Specimen: Urine, Random  Result Value Ref Range Status   Specimen Description   Final    URINE, RANDOM Performed at [facility], [address] [place], [place] [address]    Special Requests   Final    NONE Performed at [facility], [address], [place], [place] [address]    Culture   Final    NO GROWTH Performed at [facility], [address], [place], [place] [address]    Report Status 11/12/2020 FINAL  Final  Resp Panel by RT-PCR (Flu A&B, Covid) Nasopharyngeal Swab     Status: None   Collection Time: 11/12/20  8:37 PM   Specimen: Nasopharyngeal Swab; Nasopharyngeal(NP) swabs in vial transport medium  Result Value Ref Range Status   SARS Coronavirus 2 by RT PCR NEGATIVE NEGATIVE Final    Comment: (NOTE) SARS-CoV-2 target nucleic acids are NOT DETECTED.  The SARS-CoV-2 RNA is generally detectable in upper respiratory specimens during the acute phase of infection. The lowest concentration of SARS-CoV-2 viral copies this assay can detect is 138 copies/mL. A negative result does not preclude SARS-Cov-2 infection and should not be used as the sole basis for treatment or other patient management decisions. A negative result may occur with  improper specimen collection/handling, submission of specimen other than nasopharyngeal swab, presence of viral mutation(s) within the areas targeted by this assay, and inadequate number of viral copies(<138 copies/mL). A negative result must be combined with clinical observations, patient history, and epidemiological information. The expected result is Negative.  Fact Sheet for Patients:  BloggerCourse.com  Fact Sheet for Healthcare Providers:  SeriousBroker.it  This test is no t  yet approved or cleared by the [place] FDA and  has been authorized for detection and/or diagnosis of SARS-CoV-2 by FDA under an Emergency Use Authorization (EUA). This EUA will remain  in effect (meaning this test can be used) for the duration of the COVID-19 declaration under Section 564(b)(1) of the Act, 21 U.S.C.section 360bbb-3(b)(1), unless the authorization is terminated  or revoked sooner.       Influenza A by PCR NEGATIVE NEGATIVE Final   Influenza B by PCR NEGATIVE NEGATIVE Final    Comment: (NOTE) The Xpert Xpress SARS-CoV-2/FLU/RSV plus assay is intended as an aid in the diagnosis of influenza from Nasopharyngeal swab specimens and should not be used as a sole basis for treatment. Nasal washings and aspirates are unacceptable for Xpert Xpress SARS-CoV-2/FLU/RSV testing.  Fact Sheet for Patients: BloggerCourse.com  Fact Sheet for Healthcare Providers: SeriousBroker.it  This test is not yet approved or cleared by the [place] FDA and has been authorized for detection and/or diagnosis of SARS-CoV-2 by FDA under an Emergency Use Authorization (EUA). This EUA will remain in effect (meaning this test can be used) for the duration of the COVID-19 declaration under Section 564(b)(1) of the Act, 21 U.S.C. section 360bbb-3(b)(1), unless the authorization is terminated or revoked.  Performed at [facility], [address], [place], [place] [address]   MRSA PCR Screening     Status: None   Collection Time: 11/13/20  6:23 AM   Specimen: Nasal Mucosa; Nasopharyngeal  Result Value Ref Range Status   MRSA by PCR  NEGATIVE NEGATIVE Final    Comment:        The GeneXpert MRSA Assay (FDA approved for NASAL specimens only), is one component of a comprehensive MRSA colonization surveillance program. It is not intended to diagnose MRSA infection nor to guide or monitor treatment for MRSA  infections. Performed at [facility], [address] [place], [place] [id number]     Procedures/Studies: CT ABDOMEN PELVIS WO CONTRAST  Result Date: 11/10/2020 CLINICAL DATA:  Hematuria following penile prosthesis placement. EXAM: CT ABDOMEN AND PELVIS WITHOUT CONTRAST TECHNIQUE: Multidetector CT imaging of the abdomen and pelvis was performed following the standard protocol without IV contrast. COMPARISON:  March of 2016. FINDINGS: Lower chest: Incidental imaging of the lung bases without effusion or sign of consolidation. Heart is incompletely imaged. Hepatobiliary: Moderate hepatic steatosis. Liver is un enlarged. Cholelithiasis. No pericholecystic stranding. Pancreas: Pancreas of normal contours, no signs of inflammation. Spleen: Spleen normal size and contour. Adrenals/Urinary Tract: Mild RIGHT adrenal thickening with small nodule measuring 1.5 x 0.9 cm unchanged dating back to 2016, in fact unchanged dating back to 2007, likely RIGHT adrenal adenoma. Density does not allow for definitive characterizationw. LEFT adrenal is normal. Mild bilateral perinephric stranding, nonspecific not present previously. No hydronephrosis. Urinary bladder displaced from LEFT to RIGHT due to reservoir or for penile prosthesis in the LEFT lower quadrant. There is some surrounding increased density about the reservoir or compatible with small amount of hematoma measuring approximately 7 mm thickness at the periphery of the distended rest of or. Small amount of hematoma also present in the LEFT inguinal canal. Lower abdominal wall with 4.5 x 2.9 cm area measuring 37 Hounsfield units in the subcutaneous fat. Trace amounts of gas are present in the subcutaneous fat distant from this area. Corporal elements of the prosthetic device are partially imaged. Penis not imaged in its entirety. Urinary bladder with signs of mild stranding along its margin at the interface with the prosthetic reservoir or. Mild wall  thickening of the urinary bladder. There is a preserved fat plane between the reservoir or and the urinary bladder on coronal images. Stomach/Bowel: No acute gastrointestinal process. Signs of prior partial colectomy in the area of the sigmoid colon. Appendix is normal. Vascular/Lymphatic: Calcified and noncalcified atheromatous plaque of the abdominal aorta. Nonaneurysmal caliber. Smooth contour of the IVC. There is no gastrohepatic or hepatoduodenal ligament lymphadenopathy. No retroperitoneal or mesenteric lymphadenopathy. No pelvic sidewall lymphadenopathy. Reproductive: As above. Prostate unremarkable on noncontrast imaging. Other: Body wall hematoma, small and small amount of blood in the LEFT inguinal canal and surrounding the prosthetic reservoir or in the LEFT lower quadrant small hernia of the ventral abdominal wall. No intra-abdominal fluid. Small amount of hematoma tracking along extraperitoneal spaces in the LEFT retroperitoneum to the level of the sacral promontory. Musculoskeletal: No acute bone finding or destructive bone process. Spinal degenerative changes. IMPRESSION: 1. Small amounts of hematoma in the body wall, about the prosthetic reservoir or and in the LEFT inguinal canal. Note that the penis is not imaged and corporal elements are only partially imaged. There is some stranding about the base of the penis. 2. Tiny locule of gas in the LEFT inguinal canal and in the body wall nonspecific, likely postoperative. Correlate with any signs that would suggest infection. 3. Thickening of the urinary bladder displaced by the reservoir or more likely secondary. However, in the setting of ongoing hematuria urethral and or urinary bladder assessment may be helpful with urologic consultation suggested. 4. Cholelithiasis without  evidence of acute cholecystitis. 5. Moderate hepatic steatosis. 6. Mild RIGHT adrenal thickening with small nodule measuring 1.5 x 0.9 cm unchanged dating back to 2016, in fact  unchanged dating back to 2007, likely RIGHT adrenal adenoma. Consider 1 year follow-up with adrenal protocol CT given indeterminate Hounsfield unit values on the current study. 7. Signs of prior partial colectomy in the area of the sigmoid colon. 8. Aortic atherosclerosis. Electronically Signed   By: Donzetta Kohut M.D.   On: 11/10/2020 15:28   MR BRAIN WO CONTRAST  Result Date: 11/12/2020 CLINICAL DATA:  Initial evaluation for acute dizziness. EXAM: MRI HEAD WITHOUT CONTRAST TECHNIQUE: Multiplanar, multiecho pulse sequences of the brain and surrounding structures were obtained without intravenous contrast. COMPARISON:  None available. FINDINGS: Brain: Cerebral volume within normal limits for age. Scattered patchy T2/FLAIR hyperintensities noted involving the periventricular, deep, and subcortical white matter both cerebral hemispheres, nonspecific, but most like related chronic microvascular ischemic disease, overall mild in nature. Focal encephalomalacia and gliosis involving the anterior/inferior left frontal lobe favored to be related to remote traumatic injury (series 11, image 26). No abnormal foci of restricted diffusion to suggest acute or subacute ischemia. Gray-white matter differentiation maintained. No other areas of encephalomalacia to suggest chronic cortical infarction. No evidence for acute or chronic intracranial hemorrhage. No mass lesion, midline shift or mass effect. No hydrocephalus or extra-axial fluid collection. Pituitary gland suprasellar region normal. Midline structures intact. Vascular: Major intracranial vascular flow voids are maintained. Skull and upper cervical spine: Craniocervical junction normal. Bone marrow signal intensity within normal limits. Multifocal heterogeneity noted about the scalp, most notable posteriorly. Changes are nonspecific, but could reflect chronic scarring. No acute scalp soft tissue abnormality. Sinuses/Orbits: Globes and orbital soft tissues within  normal limits paranasal sinuses are largely clear. No mastoid effusion. Inner ear structures grossly normal. Other: None. IMPRESSION: 1. No acute intracranial abnormality. 2. Mild chronic microvascular ischemic disease. 3. Focal encephalomalacia and gliosis involving the anterior/inferior left frontal lobe, favored to be related to remote traumatic injury. Electronically Signed   By: Rise Mu M.D.   On: 11/12/2020 22:59   DG Chest Port 1 View  Result Date: 11/12/2020 CLINICAL DATA:  Short of breath and dizziness since yesterday, atrial fibrillation EXAM: PORTABLE CHEST 1 VIEW COMPARISON:  09/24/2013 FINDINGS: The heart size and mediastinal contours are within normal limits. Both lungs are clear. The visualized skeletal structures are unremarkable. IMPRESSION: No active disease. Electronically Signed   By: Sharlet Salina M.D.   On: 11/12/2020 18:42     Time coordinating discharge: Over 30 minutes    Lewie Chamber, MD  [facility] 11/13/2020, 3:01 PM

## 2020-11-13 NOTE — H&P (Signed)
History and Physical  Patient Name: Leroy Chen     WCH:[id number]    DOB: Sep 03, 1955    DOA: 11/12/2020 PCP: [name]  Patient coming from: Home  Chief Complaint: Symptomatic dizziness with standing    HPI: Leroy Chen is a 65 y.o. male, with PMH of paroxysmal A. fib, insulin-dependent type 2 diabetes, hypertension, recent penile implant, who presented to the ER on 11/12/2020 with dizziness upon standing.  Patient presents yesterday has had dizziness and weakness with standing and ambulation.  These events prior noted occur with standing and ambulation.  He has associated dyspnea with these events.  He said extremely difficult to get car to go to dinner because he is weak.  He went to dinner night and his wife and felt terrible, he had to lay his head on the table avoid symptoms and could not stand up without symptoms and thus EMS was called.  He recently had a penile implant at the [place] on 11/03/2020.  He did come to the ER on 4/27 secondary to hematuria, and was found to be in A. fib at that time.  His hematuria has improved.  His urine has been darker since procedure.  He says he has been drinking plenty of fluids.  He has just been laying around much more since he started to develop.  He says he had similar symptoms once prior with his A. fib.  He says he typically is not in A. fib.  Previously required cardioversion.    ED course: -Vitals on admission: Afebrile, heart rate 120s, respiratory rate 22, blood pressure 147/105, maintaining sats on room air -Labs on initial presentation: Sodium 131, potassium, chloride 99, bicarb 17, glucose 242, BUN 34, creatinine 1.69, calcium 8.8, magnesium 2.1, troponin 33, BNP 140, WBC 11.2, hemoglobin 14.5 -Imaging obtained on admission: Chest x-ray showed no acute processes.  MRI head obtained -In the ED the patient was given IV fluids, and cards contacted in ED and will see in consult.  The hospitalist service was contacted for further  evaluation and management.     ROS: A complete and thorough 12 point review of systems obtained, negative listed in HPI.     Past Medical History:  Diagnosis Date  . Atrial fibrillation [facility]) March 2015  . Diabetes mellitus without complication (HCC)    NIDDM, type II  . High cholesterol   . Hydradenitis   . Sebaceous cyst    hx of  . Sigmoid diverticulitis   . Wears glasses     Past Surgical History:  Procedure Laterality Date  . COLECTOMY  10/15/14  . hand sugery    . ILEO LOOP COLOSTOMY CLOSURE N/A 04/27/2015   Procedure: DIAGNOSTIC LAPAROSCOPY,  EXPLORATORY LAPAROTOMY WITH RESECTION AND CLOSURE OF COLOSTOMY ;  Surgeon: [name], MD;  Location: [facility] OR;  Service: General;  Laterality: N/A;  . LAPAROTOMY N/A 10/15/2014   Procedure: EXPLORATORY LAPAROTOMY WITH DRAINAGE OF INTRA- ABDOMINAL ABSCESS;  Surgeon: [name], MD;  Location: [facility] OR;  Service: General;  Laterality: N/A;  . ORCHIECTOMY    . PARTIAL COLECTOMY N/A 10/15/2014   Procedure: SIGMOID COLECTOMY WITH COLOSTOMY CREATION;  Surgeon: [name], MD;  Location: [facility] OR;  Service: General;  Laterality: N/A;  . PILONIDAL CYST EXCISION N/A 01/01/2015   Procedure: EXCISION OF PILONIDAL DISEASE;  Surgeon: Romie Levee, MD;  Location: [facility] [place];  Service: General;  Laterality: N/A;    Social History: Patient lives at home.  The patient walks  without assistance.  Former smoker.  Allergies  Allergen Reactions  . Penicillins Swelling    Family history: family history includes Healthy in his mother.  Prior to Admission medications   Medication Sig Start Date End Date Taking? Authorizing Provider  acetaminophen (TYLENOL) 500 MG tablet Take 1,000 mg by mouth 2 (two) times daily.   Yes [provider]  Adalimumab 40 MG/0.4ML PNKT Inject 40 mg into the skin once a week.   Yes [provider]  albuterol (PROVENTIL HFA;VENTOLIN HFA) 108 (90 Base) MCG/ACT inhaler Inhale 1-2 puffs  into the lungs every 6 (six) hours as needed for wheezing or shortness of breath. 08/05/17  Yes Wallis Bamberg, PA-C  cephALEXin (KEFLEX) 500 MG capsule Take 1 capsule (500 mg total) by mouth 3 (three) times daily. 11/10/20  Yes Derwood Kaplan, MD  Chlorhexidine Gluconate 4 % SOLN Apply 1 application topically See admin instructions. Apply to armpits & Groin 3 times a week in the shower. Leave on for 1 minute and then rinse   Yes [provider]  clindamycin (CLEOCIN T) 1 % lotion Apply 1 application topically 2 (two) times daily. Draining bumps 03/14/19  Yes [provider]  clobetasol (TEMOVATE) 0.05 % external solution Apply 1 application topically 2 (two) times daily as needed (infection on scalp).   Yes [provider]  dicyclomine (BENTYL) 10 MG capsule Take 10 mg by mouth 3 (three) times daily as needed for spasms.   Yes [provider]  empagliflozin (JARDIANCE) 25 MG TABS tablet Take 12.5 mg by mouth daily.   Yes [provider]  Ensure (ENSURE) Take 237 mLs by mouth daily.   Yes [provider]  ergocalciferol (VITAMIN D2) 1.25 MG (50000 UT) capsule Take 50,000 Units by mouth once a week.   Yes [provider]  flecainide (TAMBOCOR) 50 MG tablet Take 50 mg by mouth 2 (two) times daily.   Yes [provider]  Fluocinolone Acetonide 0.01 % OIL Place 1 application in ear(s) 2 (two) times daily as needed (scaling).   Yes [provider]  HYDROcodone-acetaminophen (NORCO/VICODIN) 5-325 MG tablet Take 1 tablet by mouth every 6 (six) hours as needed for severe pain.   Yes [provider]  ibuprofen (ADVIL) 200 MG tablet Take 600 mg by mouth every 6 (six) hours as needed for moderate pain.   Yes [provider]  insulin glargine (LANTUS) 100 UNIT/ML Solostar Pen Inject 55 Units into the skin every evening. 04/03/19  Yes [provider]  losartan (COZAAR) 50 MG tablet Take 50 mg by mouth daily.   Yes  [provider]  lurasidone (LATUDA) 40 MG TABS tablet Take 40 mg by mouth daily.   Yes [provider]  melatonin 3 MG TABS tablet Take 3 mg by mouth at bedtime.   Yes [provider]  metFORMIN (GLUCOPHAGE) 500 MG tablet Take 1,000 mg by mouth 2 (two) times daily with a meal.   Yes [provider]  metoprolol tartrate (LOPRESSOR) 50 MG tablet Take 75 mg by mouth 2 (two) times daily.   Yes [provider]  Multiple Vitamin (MULTIVITAMIN) tablet Take 1 tablet by mouth daily.   Yes [provider]  omeprazole (PRILOSEC) 20 MG capsule Take 20 mg by mouth 2 (two) times daily as needed (heartburn).   Yes [provider]  rivaroxaban (XARELTO) 20 MG TABS tablet Take 20 mg by mouth daily.   Yes [provider]  rosuvastatin (CRESTOR) 40 MG tablet  Take 40 mg by mouth daily.   Yes [provider]  Semaglutide (OZEMPIC, 1 MG/DOSE, [place]) Inject 1 mg into the skin once a week.   Yes [provider]  sertraline (ZOLOFT) 100 MG tablet Take 100 mg by mouth daily.   Yes [provider]  Testosterone 1.62 % GEL Place 4 Pump onto the skin daily. Apply to upper arm or shoulder areas only   Yes [provider]  sulfamethoxazole-trimethoprim (BACTRIM DS) 800-160 MG tablet Take 1 tablet by mouth 2 (two) times daily. Start date : 11/03/20 11/03/20   [provider]       Physical Exam: BP 112/70 (BP Location: Right Arm)   Pulse 91   Temp 98.9 F (37.2 C) (Oral)   Resp 18   SpO2 94%   General appearance: Well-developed, adult male, alert and in no acute distress .   Eyes: Anicteric, conjunctiva pink, lids and lashes normal. PERRL.    ENT: No nasal deformity, discharge, epistaxis.  Hearing intact. OP moist without lesions.   Neck: No neck masses.  Trachea midline.  No thyromegaly/tenderness. Lymph: No cervical or supraclavicular lymphadenopathy. Skin: Warm and dry.  No jaundice.  No suspicious  rashes or lesions. Cardiac:  Irregularly irregular, nl S1-S2, no murmurs appreciated.  No LE edema.  Radial and pedal pulses 2+ and symmetric. Respiratory: Normal respiratory rate and rhythm.  CTAB without rales or wheezes. Abdomen: Abdomen soft.  No tenderness with palpation. No ascites, distension, hepatosplenomegaly.   MSK: No deformities or effusions of the large joints of the upper or lower extremities bilaterally.  No cyanosis or clubbing. Neuro: Cranial nerves 2 through 12 grossly intact.  Sensation intact to light touch. Speech is fluent.  Marland Kitchen.    Psych: Sensorium intact and responding to questions, attention normal.  Behavior appropriate.  Judgment and insight appear normal.    Labs on Admission:  I have personally reviewed following labs and imaging studies: CBC: Recent Labs  Lab 11/10/20 1212 11/12/20 1719  WBC 11.1* 11.2*  NEUTROABS 7.7  --   HGB 14.8 14.5  HCT 46.4 44.8  MCV 94.7 93.7  PLT 176 250   Basic Metabolic Panel: Recent Labs  Lab 11/10/20 1212 11/12/20 1719  NA 139 131*  K 5.4* 4.8  CL 102 99  CO2 20* 17*  GLUCOSE 144* 242*  BUN 31* 34*  CREATININE 1.84* 1.69*  CALCIUM 9.6 8.8*  MG  --  2.1   GFR: CrCl cannot be calculated (Unknown ideal weight.).  Liver Function Tests: No results for input(s): AST, ALT, ALKPHOS, BILITOT, PROT, ALBUMIN in the last 168 hours. No results for input(s): LIPASE, AMYLASE in the last 168 hours. No results for input(s): AMMONIA in the last 168 hours. Coagulation Profile: No results for input(s): INR, PROTIME in the last 168 hours. Cardiac Enzymes: No results for input(s): CKTOTAL, CKMB, CKMBINDEX, TROPONINI in the last 168 hours. BNP (last 3 results) No results for input(s): PROBNP in the last 8760 hours. HbA1C: No results for input(s): HGBA1C in the last 72 hours. CBG: Recent Labs  Lab 11/13/20 0021  GLUCAP 147*   Lipid Profile: No results for input(s): CHOL, HDL, LDLCALC, TRIG, CHOLHDL, LDLDIRECT in the last 72  hours. Thyroid Function Tests: No results for input(s): TSH, T4TOTAL, FREET4, T3FREE, THYROIDAB in the last 72 hours. Anemia Panel: No results for input(s): VITAMINB12, FOLATE, FERRITIN, TIBC, IRON, RETICCTPCT in the last 72 hours.   Recent Results (from the past 240 hour(s))  Urine culture  Status: None   Collection Time: 11/10/20 12:53 PM   Specimen: Urine, Random  Result Value Ref Range Status   Specimen Description   Final    URINE, RANDOM Performed at [facility], [address], [place], [place] [address]    Special Requests   Final    NONE Performed at [facility], [address] [place], [place] [address]    Culture   Final    NO GROWTH Performed at [facility], [address] [place], [place] [address]    Report Status 11/12/2020 FINAL  Final  Resp Panel by RT-PCR (Flu A&B, Covid) Nasopharyngeal Swab     Status: None   Collection Time: 11/12/20  8:37 PM   Specimen: Nasopharyngeal Swab; Nasopharyngeal(NP) swabs in vial transport medium  Result Value Ref Range Status   SARS Coronavirus 2 by RT PCR NEGATIVE NEGATIVE Final    Comment: (NOTE) SARS-CoV-2 target nucleic acids are NOT DETECTED.  The SARS-CoV-2 RNA is generally detectable in upper respiratory specimens during the acute phase of infection. The lowest concentration of SARS-CoV-2 viral copies this assay can detect is 138 copies/mL. A negative result does not preclude SARS-Cov-2 infection and should not be used as the sole basis for treatment or other patient management decisions. A negative result may occur with  improper specimen collection/handling, submission of specimen other than nasopharyngeal swab, presence of viral mutation(s) within the areas targeted by this assay, and inadequate number of viral copies(<138 copies/mL). A negative result must be combined with clinical observations, patient history, and epidemiological information. The expected  result is Negative.  Fact Sheet for Patients:  BloggerCourse.com  Fact Sheet for Healthcare Providers:  SeriousBroker.it  This test is no t yet approved or cleared by the [place] FDA and  has been authorized for detection and/or diagnosis of SARS-CoV-2 by FDA under an Emergency Use Authorization (EUA). This EUA will remain  in effect (meaning this test can be used) for the duration of the COVID-19 declaration under Section 564(b)(1) of the Act, 21 U.S.C.section 360bbb-3(b)(1), unless the authorization is terminated  or revoked sooner.       Influenza A by PCR NEGATIVE NEGATIVE Final   Influenza B by PCR NEGATIVE NEGATIVE Final    Comment: (NOTE) The Xpert Xpress SARS-CoV-2/FLU/RSV plus assay is intended as an aid in the diagnosis of influenza from Nasopharyngeal swab specimens and should not be used as a sole basis for treatment. Nasal washings and aspirates are unacceptable for Xpert Xpress SARS-CoV-2/FLU/RSV testing.  Fact Sheet for Patients: BloggerCourse.com  Fact Sheet for Healthcare Providers: SeriousBroker.it  This test is not yet approved or cleared by the [place] FDA and has been authorized for detection and/or diagnosis of SARS-CoV-2 by FDA under an Emergency Use Authorization (EUA). This EUA will remain in effect (meaning this test can be used) for the duration of the COVID-19 declaration under Section 564(b)(1) of the Act, 21 U.S.C. section 360bbb-3(b)(1), unless the authorization is terminated or revoked.  Performed at [facility], [address] [place], [place] [address]            Radiological Exams on Admission: Personally reviewed imaging which shows: Chest x-ray on admission unimpressive for acute etiology DG Chest Port 1 View  Result Date: 11/12/2020 CLINICAL DATA:  Short of breath and dizziness since yesterday,  atrial fibrillation EXAM: PORTABLE CHEST 1 VIEW COMPARISON:  09/24/2013 FINDINGS: The heart size and mediastinal contours are within normal limits. Both lungs  are clear. The visualized skeletal structures are unremarkable. IMPRESSION: No active disease. Electronically Signed   By: Sharlet Salina M.D.   On: 11/12/2020 18:42    EKG: Independently reviewed.  A. fib with RVR     Assessment/Plan   1.  Symptomatic orthostatic dizziness - Patient has significant dizziness with standing, extremely weak - Unable to perform orthostatics in the ED given symptoms - MRI performed in the ED, awaiting read - Suspect related to A. fib - Cardiology consulted in the ED, will see in the morning - Fall precautions - We will give gentle IV hydration - Hold home Jardiance as suspect is on the dryer side currently - Restart home Toprol, flecainide, Xarelto - Telemetry  2.  A. fib with RVR - On admission heart rate 120.  EKG consistent atrial fibrillation - Cardiology consulted in the ED, will see in the morning - Restart home Toprol, flecainide, Xarelto - Telemetry  3.  Insulin-dependent type 2 diabetes - Hemoglobin A1c ordered - Continue Lantus 25 units daily - Glucose checks, sliding scale - Hold home Jardiance  4.  Elevated creatinine - Patient denies history of CKD, but creatinine 1.69 on admission.  Not a lot of previous labs to compare - Possibly on the dry side - Hold home Jardiance and losartan for now - Follow-up labs ordered   5.  Hyponatremia -On admission, sodium 131 - Suspect related to ADH excess - Urine sodium, urine osmolality ordered - Follow-up labs ordered  6.  Elevated troponin - Admission troponin 33, repeat 62 -Suspect secondary to demand - We will continue to trend - Cardiology consulted    DVT prophylaxis: Home Xarelto Code Status: Full Family Communication: None Disposition Plan: Anticipate discharge home when medically optimized Consults called:  Cardiology contacted by ED Admission status: Observation telemetry   At the point of initial evaluation, it is my clinical opinion that admission for OBSERVATION is reasonable and necessary because the patient's presenting complaints in the context of their chronic conditions represent sufficient risk of deterioration or significant morbidity to constitute reasonable grounds for close observation in the hospital setting, but that the patient may be medically stable for discharge from the hospital within 24 to 48 hours.    Medical decision making: Patient seen at 12:28 AM on 11/13/2020.  The patient was discussed with ER provider.  What exists of the patient's chart was reviewed in depth and summarized above.  Clinical condition: Fair.        [name] [facility] Please page though AMION or Epic secure chat:  For password, contact charge nurse

## 2020-11-13 NOTE — Evaluation (Signed)
Physical Therapy Evaluation Patient Details Name: Leroy Chen MRN: [id number] DOB: [birth year] Today's Date: 11/13/2020   History of Present Illness  Leroy Chen is a 65 y.o. male, with PMH of paroxysmal A. fib, insulin-dependent type 2 diabetes, hypertension, recent penile implant, who presented to the ER on 11/12/2020 with dizziness upon standing.On presentation he was found to be in A. fib with RVR.  He has been cardioverted in the past  Clinical Impression  Patient eager to DC home. Has walked in room only. Patient required a little assistance  To sit uop, citing discomfort from recent surgery. Ambulated x 120' , held IV pole, no noted  Balance loss prior to taking pole when in room. Patient ambulates with a wide base for comfort, per patient.  HR 90, SPO2 100%. No further PT indicated.    Follow Up Recommendations No PT follow up    Equipment Recommendations  None recommended by PT    Recommendations for Other Services       Precautions / Restrictions Precautions Precaution Comments: afib      Mobility  Bed Mobility Overal bed mobility: Needs Assistance Bed Mobility: Supine to Sit     Supine to sit: Min assist     General bed mobility comments: keeping legs apart,  required  hand hold to pull to sitting    Transfers Overall transfer level: Needs assistance Equipment used: 1 person hand held assist Transfers: Sit to/from Stand Sit to Stand: Min assist         General transfer comment: min assistnace to stand from low bed, wide base  Ambulation/Gait Ambulation/Gait assistance: Supervision Gait Distance (Feet): 120 Feet Assistive device: IV Pole Gait Pattern/deviations: Wide base of support;Step-through pattern   Gait velocity interpretation: <1.31 ft/sec, indicative of household ambulator General Gait Details: patient withslight  dizziness. patient limited distance as he is to Dc home  Stairs            Wheelchair Mobility    Modified  Rankin (Stroke Patients Only)       Balance Overall balance assessment: Mild deficits observed, not formally tested                                           Pertinent Vitals/Pain Pain Assessment: Faces Faces Pain Scale: Hurts even more Pain Location: groin/periarea, recent surgery Pain Descriptors / Indicators: Discomfort Pain Intervention(s): Repositioned;Monitored during session    Home Living Family/patient expects to be discharged to:: Private residence   Available Help at Discharge: Family Type of Home: House Home Access: Stairs to enter Entrance Stairs-Rails: Doctor, general practice of Steps: 2 Home Layout: Two level;Able to live on main level with bedroom/bathroom        Prior Function Level of Independence: Independent               Hand Dominance        Extremity/Trunk Assessment   Upper Extremity Assessment Upper Extremity Assessment: Overall WFL for tasks assessed    Lower Extremity Assessment Lower Extremity Assessment: Overall WFL for tasks assessed    Cervical / Trunk Assessment Cervical / Trunk Assessment: Normal  Communication   Communication: No difficulties  Cognition Arousal/Alertness: Awake/alert Behavior During Therapy: WFL for tasks assessed/performed Overall Cognitive Status: Within Functional Limits for tasks assessed  General Comments General comments (skin integrity, edema, etc.): mostly appears due to reports of  discomfort in groin area, keeping wide base    Exercises     Assessment/Plan    PT Assessment Patent does not need any further PT services  PT Problem List         PT Treatment Interventions      PT Goals (Current goals can be found in the Care Plan section)  Acute Rehab PT Goals Patient Stated Goal: go home PT Goal Formulation: All assessment and education complete, DC therapy    Frequency     Barriers to discharge         Co-evaluation               AM-PAC PT "6 Clicks" Mobility  Outcome Measure Help needed turning from your back to your side while in a flat bed without using bedrails?: A Little Help needed moving from lying on your back to sitting on the side of a flat bed without using bedrails?: A Little Help needed moving to and from a bed to a chair (including a wheelchair)?: A Little Help needed standing up from a chair using your arms (e.g., wheelchair or bedside chair)?: A Little Help needed to walk in hospital room?: A Little Help needed climbing 3-5 steps with a railing? : A Little 6 Click Score: 18    End of Session   Activity Tolerance: Patient tolerated treatment well Patient left: in bed;with call bell/phone within reach;with family/visitor present Nurse Communication: Mobility status PT Visit Diagnosis: Difficulty in walking, not elsewhere classified (R26.2)    Time: 4268-3419 PT Time Calculation (min) (ACUTE ONLY): 10 min   Charges:   PT Evaluation $PT Eval Low Complexity: 1 Low          Blanchard Kelch PT Acute Rehabilitation Services Pager [phone] Office [phone]   Rada Hay 11/13/2020, 3:25 PM

## 2020-11-13 NOTE — ED Notes (Signed)
ED TO INPATIENT HANDOFF REPORT  Name/Age/Gender Leroy Chen 65 y.o. male  Code Status    Code Status Orders  (From admission, onward)         Start     Ordered   11/12/20 2333  Full code  Continuous        11/12/20 2334        Code Status History    Date Active Date Inactive Code Status Order ID Comments User Context   04/27/2015 1446 05/03/2015 1436 Full Code [id number]  Leroy Kelp, MD Inpatient   10/15/2014 1723 10/24/2014 1436 Full Code [id number]  Leroy Kelp, MD Inpatient   10/12/2014 2033 10/15/2014 1723 Full Code [id number]  Leroy Salter, MD ED   Advance Care Planning Activity      Home/SNF/Other Home  Chief Complaint Orthostatic dizziness [R42]  Level of Care/Admitting Diagnosis ED Disposition    ED Disposition Condition Comment   Admit  Hospital Area: [facility] [[id number]]  Level of Care: Telemetry [5]  Admit to tele based on following criteria: Complex arrhythmia (Bradycardia/Tachycardia)  Covid Evaluation: Confirmed COVID Negative  Diagnosis: Orthostatic dizziness [[id number]]  Admitting Physician: Leroy Chen [[id number]]  Attending Physician: Leroy Chen [[id number]]       Medical History Past Medical History:  Diagnosis Date  . Atrial fibrillation [facility]) March 2015  . Diabetes mellitus without complication (HCC)    NIDDM, type II  . High cholesterol   . Hydradenitis   . Sebaceous cyst    hx of  . Sigmoid diverticulitis   . Wears glasses     Allergies Allergies  Allergen Reactions  . Penicillins Swelling    IV Location/Drains/Wounds Patient Lines/Drains/Airways Status    Active Line/Drains/Airways    Name Placement date Placement time Site Days   Peripheral IV 11/12/20 Left Antecubital 11/12/20  1715  Antecubital  1   Colostomy LLQ 10/15/14  --  LLQ  2221   Incision (Closed) 01/01/15 Rectum Other (Comment) 01/01/15  0956  -- 2143   Incision (Closed) 04/27/15 Abdomen Other (Comment) 04/27/15  1123   -- 2027   Incision (Closed) 04/27/15 Abdomen Left 04/27/15  1123  -- 2027          Labs/Imaging Results for orders placed or performed during the hospital encounter of 11/12/20 (from the past 48 hour(s))  Basic metabolic panel     Status: Abnormal   Collection Time: 11/12/20  5:19 PM  Result Value Ref Range   Sodium 131 (L) 135 - 145 mmol/L   Potassium 4.8 3.5 - 5.1 mmol/L   Chloride 99 98 - 111 mmol/L   CO2 17 (L) 22 - 32 mmol/L   Glucose, Bld 242 (H) 70 - 99 mg/dL    Comment: Glucose reference range applies only to samples taken after fasting for at least 8 hours.   BUN 34 (H) 8 - 23 mg/dL   Creatinine, Ser 6.38 (H) 0.61 - 1.24 mg/dL   Calcium 8.8 (L) 8.9 - 10.3 mg/dL   GFR, Estimated 45 (L) >60 mL/min    Comment: (NOTE) Calculated using the CKD-EPI Creatinine Equation (2021)    Anion gap 15 5 - 15    Comment: Performed at [facility], [address], [place], [place] [address]  CBC     Status: Abnormal   Collection Time: 11/12/20  5:19 PM  Result Value Ref Range   WBC 11.2 (H) 4.0 - 10.5 K/uL   RBC 4.78 4.22 - 5.81 MIL/uL  Hemoglobin 14.5 13.0 - 17.0 g/dL   HCT 49.4 49.6 - 75.9 %   MCV 93.7 80.0 - 100.0 fL   MCH 30.3 26.0 - 34.0 pg   MCHC 32.4 30.0 - 36.0 g/dL   RDW 16.3 84.6 - 65.9 %   Platelets 250 150 - 400 K/uL   nRBC 0.0 0.0 - 0.2 %    Comment: Performed at [facility], [address], [place], [place] [address]  Magnesium     Status: None   Collection Time: 11/12/20  5:19 PM  Result Value Ref Range   Magnesium 2.1 1.7 - 2.4 mg/dL    Comment: Performed at [facility], [address], [place], [place] [address]  Brain natriuretic peptide     Status: Abnormal   Collection Time: 11/12/20  5:19 PM  Result Value Ref Range   B Natriuretic Peptide 139.6 (H) 0.0 - 100.0 pg/mL    Comment: Performed at [facility], [address], [place], [place] [address]  Troponin I (High Sensitivity)      Status: Abnormal   Collection Time: 11/12/20  5:19 PM  Result Value Ref Range   Troponin I (High Sensitivity) 33 (H) <18 ng/L    Comment: (NOTE) Elevated high sensitivity troponin I (hsTnI) values and significant  changes across serial measurements may suggest ACS but many other  chronic and acute conditions are known to elevate hsTnI results.  Refer to the "Links" section for chest pain algorithms and additional  guidance. Performed at [facility], [address] [place], [place] [address]   Troponin I (High Sensitivity)     Status: Abnormal   Collection Time: 11/12/20  7:47 PM  Result Value Ref Range   Troponin I (High Sensitivity) 62 (H) <18 ng/L    Comment: (NOTE) Elevated high sensitivity troponin I (hsTnI) values and significant  changes across serial measurements may suggest ACS but many other  chronic and acute conditions are known to elevate hsTnI results.  Refer to the Links section for chest pain algorithms and additional  guidance. Performed at [facility], [address] [place], [place] [address]   Resp Panel by RT-PCR (Flu A&B, Covid) Nasopharyngeal Swab     Status: None   Collection Time: 11/12/20  8:37 PM   Specimen: Nasopharyngeal Swab; Nasopharyngeal(NP) swabs in vial transport medium  Result Value Ref Range   SARS Coronavirus 2 by RT PCR NEGATIVE NEGATIVE    Comment: (NOTE) SARS-CoV-2 target nucleic acids are NOT DETECTED.  The SARS-CoV-2 RNA is generally detectable in upper respiratory specimens during the acute phase of infection. The lowest concentration of SARS-CoV-2 viral copies this assay can detect is 138 copies/mL. A negative result does not preclude SARS-Cov-2 infection and should not be used as the sole basis for treatment or other patient management decisions. A negative result may occur with  improper specimen collection/handling, submission of specimen other than nasopharyngeal swab, presence of  viral mutation(s) within the areas targeted by this assay, and inadequate number of viral copies(<138 copies/mL). A negative result must be combined with clinical observations, patient history, and epidemiological information. The expected result is Negative.  Fact Sheet for Patients:  BloggerCourse.com  Fact Sheet for Healthcare Providers:  SeriousBroker.it  This test is no t yet approved or cleared by the [place] FDA and  has been authorized for detection and/or diagnosis of SARS-CoV-2 by FDA under an Emergency Use Authorization (EUA). This EUA will remain  in  effect (meaning this test can be used) for the duration of the COVID-19 declaration under Section 564(b)(1) of the Act, 21 U.S.C.section 360bbb-3(b)(1), unless the authorization is terminated  or revoked sooner.       Influenza A by PCR NEGATIVE NEGATIVE   Influenza B by PCR NEGATIVE NEGATIVE    Comment: (NOTE) The Xpert Xpress SARS-CoV-2/FLU/RSV plus assay is intended as an aid in the diagnosis of influenza from Nasopharyngeal swab specimens and should not be used as a sole basis for treatment. Nasal washings and aspirates are unacceptable for Xpert Xpress SARS-CoV-2/FLU/RSV testing.  Fact Sheet for Patients: BloggerCourse.com  Fact Sheet for Healthcare Providers: SeriousBroker.it  This test is not yet approved or cleared by the [place] FDA and has been authorized for detection and/or diagnosis of SARS-CoV-2 by FDA under an Emergency Use Authorization (EUA). This EUA will remain in effect (meaning this test can be used) for the duration of the COVID-19 declaration under Section 564(b)(1) of the Act, 21 U.S.C. section 360bbb-3(b)(1), unless the authorization is terminated or revoked.  Performed at [facility], [address] [place], [place] [address]   CBG monitoring, ED      Status: Abnormal   Collection Time: 11/13/20 12:21 AM  Result Value Ref Range   Glucose-Capillary 147 (H) 70 - 99 mg/dL    Comment: Glucose reference range applies only to samples taken after fasting for at least 8 hours.  CBC     Status: Abnormal   Collection Time: 11/13/20  4:00 AM  Result Value Ref Range   WBC 10.7 (H) 4.0 - 10.5 K/uL   RBC 4.33 4.22 - 5.81 MIL/uL   Hemoglobin 13.0 13.0 - 17.0 g/dL   HCT 41.7 40.8 - 14.4 %   MCV 94.7 80.0 - 100.0 fL   MCH 30.0 26.0 - 34.0 pg   MCHC 31.7 30.0 - 36.0 g/dL   RDW 81.8 56.3 - 14.9 %   Platelets 227 150 - 400 K/uL   nRBC 0.0 0.0 - 0.2 %    Comment: Performed at [facility], [address] [place], [place] [address]   MR BRAIN WO CONTRAST  Result Date: 11/12/2020 CLINICAL DATA:  Initial evaluation for acute dizziness. EXAM: MRI HEAD WITHOUT CONTRAST TECHNIQUE: Multiplanar, multiecho pulse sequences of the brain and surrounding structures were obtained without intravenous contrast. COMPARISON:  None available. FINDINGS: Brain: Cerebral volume within normal limits for age. Scattered patchy T2/FLAIR hyperintensities noted involving the periventricular, deep, and subcortical white matter both cerebral hemispheres, nonspecific, but most like related chronic microvascular ischemic disease, overall mild in nature. Focal encephalomalacia and gliosis involving the anterior/inferior left frontal lobe favored to be related to remote traumatic injury (series 11, image 26). No abnormal foci of restricted diffusion to suggest acute or subacute ischemia. Gray-white matter differentiation maintained. No other areas of encephalomalacia to suggest chronic cortical infarction. No evidence for acute or chronic intracranial hemorrhage. No mass lesion, midline shift or mass effect. No hydrocephalus or extra-axial fluid collection. Pituitary gland suprasellar region normal. Midline structures intact. Vascular: Major intracranial vascular flow voids  are maintained. Skull and upper cervical spine: Craniocervical junction normal. Bone marrow signal intensity within normal limits. Multifocal heterogeneity noted about the scalp, most notable posteriorly. Changes are nonspecific, but could reflect chronic scarring. No acute scalp soft tissue abnormality. Sinuses/Orbits: Globes and orbital soft tissues within normal limits paranasal sinuses are largely clear. No mastoid effusion. Inner ear structures grossly normal. Other: None. IMPRESSION: 1. No acute intracranial  abnormality. 2. Mild chronic microvascular ischemic disease. 3. Focal encephalomalacia and gliosis involving the anterior/inferior left frontal lobe, favored to be related to remote traumatic injury. Electronically Signed   By: Rise Mu M.D.   On: 11/12/2020 22:59   DG Chest Port 1 View  Result Date: 11/12/2020 CLINICAL DATA:  Short of breath and dizziness since yesterday, atrial fibrillation EXAM: PORTABLE CHEST 1 VIEW COMPARISON:  09/24/2013 FINDINGS: The heart size and mediastinal contours are within normal limits. Both lungs are clear. The visualized skeletal structures are unremarkable. IMPRESSION: No active disease. Electronically Signed   By: Sharlet Salina M.D.   On: 11/12/2020 18:42    Pending Labs Unresulted Labs (From admission, onward)          Start     Ordered   11/13/20 0500  Magnesium  Tomorrow morning,   R        11/12/20 2334   11/13/20 0500  Phosphorus  Tomorrow morning,   R        11/12/20 2334   11/13/20 0500  HIV Antibody (routine testing w rflx)  (HIV Antibody (Routine testing w reflex) panel)  Tomorrow morning,   R        11/12/20 2334   11/13/20 0500  Comprehensive metabolic panel  Tomorrow morning,   R        11/12/20 2334   11/13/20 0104  Osmolality  Add-on,   AD        11/13/20 0103   11/12/20 2335  Sodium, urine, random  Once,   STAT        11/12/20 2334   11/12/20 2335  Osmolality, urine  Once,   STAT        11/12/20 2334   11/12/20 2332   Hemoglobin A1c  Add-on,   AD       Comments: To assess prior glycemic control    11/12/20 2332          Vitals/Pain Today's Vitals   11/13/20 0138 11/13/20 0145 11/13/20 0209 11/13/20 0245  BP: 128/79 134/66  106/71  Pulse: 92 95  86  Resp:  (!) 23  (!) 22  Temp:      TempSrc:      SpO2:  97%  96%  PainSc:   4      Isolation Precautions Airborne and Contact precautions  Medications Medications  HYDROcodone-acetaminophen (NORCO/VICODIN) 5-325 MG per tablet 1 tablet (1 tablet Oral Given 11/13/20 0139)  flecainide (TAMBOCOR) tablet 50 mg (50 mg Oral Given 11/13/20 0140)  metoprolol tartrate (LOPRESSOR) tablet 75 mg (75 mg Oral Given 11/13/20 0138)  rosuvastatin (CRESTOR) tablet 40 mg (has no administration in time range)  sertraline (ZOLOFT) tablet 100 mg (has no administration in time range)  lurasidone (LATUDA) tablet 40 mg (has no administration in time range)  rivaroxaban (XARELTO) tablet 20 mg (has no administration in time range)  melatonin tablet 3 mg (3 mg Oral Given 11/13/20 0138)  albuterol (VENTOLIN HFA) 108 (90 Base) MCG/ACT inhaler 1-2 puff (has no administration in time range)  insulin glargine (LANTUS) injection 55 Units (55 Units Subcutaneous Given 11/13/20 0043)  insulin aspart (novoLOG) injection 0-15 Units (has no administration in time range)  insulin aspart (novoLOG) injection 0-5 Units (0 Units Subcutaneous Not Given 11/13/20 0045)  acetaminophen (TYLENOL) tablet 650 mg (has no administration in time range)    Or  acetaminophen (TYLENOL) suppository 650 mg (has no administration in time range)  ondansetron (ZOFRAN) tablet 4 mg (has no administration in  time range)    Or  ondansetron (ZOFRAN) injection 4 mg (has no administration in time range)  polyethylene glycol (MIRALAX / GLYCOLAX) packet 17 g (has no administration in time range)  0.9 %  sodium chloride infusion ( Intravenous New Bag/Given 11/13/20 0140)  sodium chloride 0.9 % bolus 500 mL (0 mLs  Intravenous Stopped 11/12/20 2030)    Mobility walks with person assist

## 2020-11-13 NOTE — Plan of Care (Signed)

## 2021-04-20 ENCOUNTER — Observation Stay (HOSPITAL_COMMUNITY)
Admission: EM | Admit: 2021-04-20 | Discharge: 2021-04-22 | Disposition: A | Discharge status: Home / Self Care | Payer: No Typology Code available for payment source | Attending: Internal Medicine | Admitting: Internal Medicine

## 2021-04-20 ENCOUNTER — Encounter (HOSPITAL_COMMUNITY): Payer: Self-pay | Admitting: Oncology

## 2021-04-20 ENCOUNTER — Emergency Department (HOSPITAL_COMMUNITY): Payer: No Typology Code available for payment source

## 2021-04-20 ENCOUNTER — Other Ambulatory Visit: Payer: Self-pay

## 2021-04-20 DIAGNOSIS — N1831 Chronic kidney disease, stage 3a: Secondary | ICD-10-CM | POA: Diagnosis not present

## 2021-04-20 DIAGNOSIS — I4891 Unspecified atrial fibrillation: Secondary | ICD-10-CM | POA: Diagnosis present

## 2021-04-20 DIAGNOSIS — I455 Other specified heart block: Principal | ICD-10-CM | POA: Insufficient documentation

## 2021-04-20 DIAGNOSIS — Z7901 Long term (current) use of anticoagulants: Secondary | ICD-10-CM | POA: Diagnosis not present

## 2021-04-20 DIAGNOSIS — R0789 Other chest pain: Secondary | ICD-10-CM | POA: Diagnosis present

## 2021-04-20 DIAGNOSIS — I480 Paroxysmal atrial fibrillation: Secondary | ICD-10-CM | POA: Diagnosis not present

## 2021-04-20 DIAGNOSIS — E1122 Type 2 diabetes mellitus with diabetic chronic kidney disease: Secondary | ICD-10-CM | POA: Insufficient documentation

## 2021-04-20 DIAGNOSIS — E119 Type 2 diabetes mellitus without complications: Secondary | ICD-10-CM

## 2021-04-20 DIAGNOSIS — I459 Conduction disorder, unspecified: Secondary | ICD-10-CM | POA: Diagnosis present

## 2021-04-20 DIAGNOSIS — Z20822 Contact with and (suspected) exposure to covid-19: Secondary | ICD-10-CM | POA: Insufficient documentation

## 2021-04-20 DIAGNOSIS — N183 Chronic kidney disease, stage 3 unspecified: Secondary | ICD-10-CM | POA: Diagnosis present

## 2021-04-20 DIAGNOSIS — D689 Coagulation defect, unspecified: Secondary | ICD-10-CM | POA: Diagnosis present

## 2021-04-20 DIAGNOSIS — Z79899 Other long term (current) drug therapy: Secondary | ICD-10-CM | POA: Diagnosis not present

## 2021-04-20 DIAGNOSIS — Z87891 Personal history of nicotine dependence: Secondary | ICD-10-CM | POA: Insufficient documentation

## 2021-04-20 LAB — COMPREHENSIVE METABOLIC PANEL
ALT: 20 U/L (ref 0–44)
AST: 22 U/L (ref 15–41)
Albumin: 4 g/dL (ref 3.5–5.0)
Alkaline Phosphatase: 53 U/L (ref 38–126)
Anion gap: 11 (ref 5–15)
BUN: 19 mg/dL (ref 8–23)
CO2: 26 mmol/L (ref 22–32)
Calcium: 9.8 mg/dL (ref 8.9–10.3)
Chloride: 105 mmol/L (ref 98–111)
Creatinine, Ser: 1.67 mg/dL — ABNORMAL HIGH (ref 0.61–1.24)
GFR, Estimated: 45 mL/min — ABNORMAL LOW (ref >60)
Glucose, Bld: 184 mg/dL — ABNORMAL HIGH (ref 70–99)
Potassium: 4.9 mmol/L (ref 3.5–5.1)
Sodium: 142 mmol/L (ref 135–145)
Total Bilirubin: 1.9 mg/dL — ABNORMAL HIGH (ref 0.3–1.2)
Total Protein: 8.3 g/dL — ABNORMAL HIGH (ref 6.5–8.1)

## 2021-04-20 LAB — CBC WITH DIFFERENTIAL/PLATELET
Abs Immature Granulocytes: 0.03 10*3/uL (ref 0.00–0.07)
Basophils Absolute: 0.1 10*3/uL (ref 0.0–0.1)
Basophils Relative: 1 %
Eosinophils Absolute: 0.2 10*3/uL (ref 0.0–0.5)
Eosinophils Relative: 2 %
HCT: 56.1 % — ABNORMAL HIGH (ref 39.0–52.0)
Hemoglobin: 17.5 g/dL — ABNORMAL HIGH (ref 13.0–17.0)
Immature Granulocytes: 0 %
Lymphocytes Relative: 29 %
Lymphs Abs: 2.4 10*3/uL (ref 0.7–4.0)
MCH: 29.5 pg (ref 26.0–34.0)
MCHC: 31.2 g/dL (ref 30.0–36.0)
MCV: 94.4 fL (ref 80.0–100.0)
Monocytes Absolute: 0.9 10*3/uL (ref 0.1–1.0)
Monocytes Relative: 11 %
Neutro Abs: 4.8 10*3/uL (ref 1.7–7.7)
Neutrophils Relative %: 57 %
Platelets: 239 10*3/uL (ref 150–400)
RBC: 5.94 MIL/uL — ABNORMAL HIGH (ref 4.22–5.81)
RDW: 14.5 % (ref 11.5–15.5)
WBC: 8.4 10*3/uL (ref 4.0–10.5)
nRBC: 0 % (ref 0.0–0.2)

## 2021-04-20 LAB — MAGNESIUM: Magnesium: 2 mg/dL (ref 1.7–2.4)

## 2021-04-20 LAB — TROPONIN I (HIGH SENSITIVITY): Troponin I (High Sensitivity): 6 ng/L (ref <18)

## 2021-04-20 MED ORDER — METOPROLOL TARTRATE 25 MG PO TABS
75.0000 mg | ORAL_TABLET | Freq: Two times a day (BID) | ORAL | Status: DC
  Administered 2021-04-20: 75 mg via ORAL
  Filled 2021-04-20: qty 3

## 2021-04-20 MED ORDER — FLECAINIDE ACETATE 50 MG PO TABS
50.0000 mg | ORAL_TABLET | Freq: Two times a day (BID) | ORAL | Status: DC
  Administered 2021-04-20: 50 mg via ORAL
  Filled 2021-04-20: qty 1

## 2021-04-20 NOTE — ED Provider Notes (Signed)
[facility]EMERGENCY DEPT Provider Note   CSN: [id number] Arrival date & time: 04/20/21  1443     History Chief Complaint  Patient presents with   Chest Pain    Leroy Chen is a 65 y.o. male.  HPI Patient presents intermittent symptoms of shortness of breath and near syncope over the past 3 weeks.  Episodes occur daily.  He has a history of atrial fibrillation.  He has been started on flecainide and metoprolol.  He had a hospitalization in April for A. fib.  He has not had any known recurrence of A. fib since that time.  He is followed by a cardiologist at the [facility].  His recent symptoms are not consistent with his symptoms of prior episodes of atrial fibrillation.  He has intermittent chest pain which is minimal.  He is more concerned of the shortness of breath and near syncopal symptoms.  He has been taking his medications as prescribed.  In addition to his rhythm/rate control, patient takes Eliquis.  He denies any recent infectious symptoms.  His last echocardiogram through the [place] was approximately 6 months ago.  At that time, it showed normal function.  He has not had any known history of MI. HPI: A 65 year old patient with a history of treated diabetes, hypertension and obesity presents for evaluation of chest pain. Initial onset of pain was more than 6 hours ago. The patient's chest pain is described as heaviness/pressure/tightness and is not worse with exertion. The patient's chest pain is middle- or left-sided, is not well-localized, is not sharp and does not radiate to the arms/jaw/neck. The patient does not complain of nausea and denies diaphoresis. The patient has no history of stroke, has no history of peripheral artery disease, has not smoked in the past 90 days, has no relevant family history of coronary artery disease (first degree relative at less than age 15) and has no history of hypercholesterolemia.   Past Medical History:  Diagnosis Date   Atrial  fibrillation [facility]) March 2015   Diabetes mellitus without complication (HCC)    NIDDM, type II   High cholesterol    Hydradenitis    Sebaceous cyst    hx of   Sigmoid diverticulitis    Wears glasses     Patient Active Problem List   Diagnosis Date Noted   Heart block 04/21/2021   Ulcerative colitis (HCC) 04/21/2021   Coagulopathy (HCC) 04/21/2021   Hyponatremia 11/12/2020   CKD (chronic kidney disease), stage III (HCC) 11/12/2020   Diverticulitis large intestine 04/27/2015   Unspecified atrial fibrillation (HCC) 10/17/2014   Smoker 10/17/2014   Demand ischemia from AF with RVR-Troponin 0.07 10/17/2014   Diverticulitis of large intestine with abscess without bleeding 10/15/2014   Diverticulitis of intestine with abscess without bleeding 10/12/2014   Colonic diverticular abscess- s/p colectomy 10/15/14 10/12/2014   Folliculitis 10/12/2014   Sepsis (HCC) 10/12/2014   Insulin-requiring or dependent type II diabetes mellitus (HCC) 10/12/2014   Hidradenitis suppurativa 08/02/2012    Past Surgical History:  Procedure Laterality Date   COLECTOMY  10/15/14   hand sugery     ILEO LOOP COLOSTOMY CLOSURE N/A 04/27/2015   Procedure: DIAGNOSTIC LAPAROSCOPY,  EXPLORATORY LAPAROTOMY WITH RESECTION AND CLOSURE OF COLOSTOMY ;  Surgeon: [name], MD;  Location: [facility] OR;  Service: General;  Laterality: N/A;   LAPAROTOMY N/A 10/15/2014   Procedure: EXPLORATORY LAPAROTOMY WITH DRAINAGE OF INTRA- ABDOMINAL ABSCESS;  Surgeon: [name], MD;  Location: [facility] OR;  Service: General;  Laterality: N/A;   ORCHIECTOMY     PARTIAL COLECTOMY N/A 10/15/2014   Procedure: SIGMOID COLECTOMY WITH COLOSTOMY CREATION;  Surgeon: [name], MD;  Location: [facility] OR;  Service: General;  Laterality: N/A;   PILONIDAL CYST EXCISION N/A 01/01/2015   Procedure: EXCISION OF PILONIDAL DISEASE;  Surgeon: Romie Levee, MD;  Location: [facility];  Service: General;  Laterality: N/A;       Family  History  Problem Relation Age of Onset   Healthy Mother     Social History   Tobacco Use   Smoking status: Former    Packs/day: 0.25    Years: 20.00    Pack years: 5.00    Types: Cigarettes    Quit date: 2018    Years since quitting: 4.7   Smokeless tobacco: Never  Vaping Use   Vaping Use: Never used  Substance Use Topics   Alcohol use: Yes    Comment: Social- moderate - one beer at the wedding     Drug use: No    Home Medications Prior to Admission medications   Medication Sig Start Date End Date Taking? Authorizing Provider  acetaminophen (TYLENOL) 500 MG tablet Take 1,000 mg by mouth 2 (two) times daily.   Yes [provider]  Adalimumab 40 MG/0.4ML PNKT Inject 40 mg into the skin once a week. Mondays   Yes [provider]  albuterol (PROVENTIL HFA;VENTOLIN HFA) 108 (90 Base) MCG/ACT inhaler Inhale 1-2 puffs into the lungs every 6 (six) hours as needed for wheezing or shortness of breath. 08/05/17  Yes Wallis Bamberg, PA-C  Chlorhexidine Gluconate 4 % SOLN Apply 1 application topically See admin instructions. Apply to armpits & Groin 3 times a week in the shower. Leave on for 1 minute and then rinse   Yes [provider]  clindamycin (CLEOCIN T) 1 % lotion Apply 1 application topically 2 (two) times daily. Draining bumps 03/14/19  Yes [provider]  clobetasol (TEMOVATE) 0.05 % external solution Apply 1 application topically 2 (two) times daily as needed (infection on scalp).   Yes [provider]  dicyclomine (BENTYL) 10 MG capsule Take 10 mg by mouth 3 (three) times daily as needed for spasms.   Yes [provider]  empagliflozin (JARDIANCE) 25 MG TABS tablet Take 12.5 mg by mouth daily.   Yes [provider]  Ensure (ENSURE) Take 237 mLs by mouth daily.   Yes [provider]  ergocalciferol (VITAMIN D2) 1.25 MG (50000 UT) capsule Take 50,000 Units by mouth once a week. Mondays   Yes [provider]   flecainide (TAMBOCOR) 50 MG tablet Take 50 mg by mouth 2 (two) times daily.   Yes [provider]  Fluocinolone Acetonide 0.01 % OIL Place 1 application in ear(s) 2 (two) times daily as needed (scaling).   Yes [provider]  HYDROcodone-acetaminophen (NORCO/VICODIN) 5-325 MG tablet Take 1 tablet by mouth every 6 (six) hours as needed for severe pain.   Yes [provider]  ibuprofen (ADVIL) 200 MG tablet Take 600 mg by mouth every 6 (six) hours as needed for moderate pain.   Yes [provider]  insulin glargine (LANTUS) 100 UNIT/ML Solostar Pen Inject 55 Units into the skin every evening. 04/03/19  Yes [provider]  losartan (COZAAR) 50 MG tablet Take 50 mg by mouth daily.   Yes [provider]  lurasidone (LATUDA) 40 MG TABS tablet Take 40 mg by mouth daily.   Yes [provider]  melatonin 3 MG TABS tablet Take 3 mg by mouth at bedtime.   Yes [provider]  metFORMIN (GLUCOPHAGE) 500 MG tablet Take 1,000 mg by mouth 2 (two) times daily with a meal.   Yes [provider]  metoprolol tartrate (LOPRESSOR) 50 MG tablet Take 75 mg by mouth 2 (two) times daily.   Yes [provider]  Multiple Vitamin (MULTIVITAMIN) tablet Take 1 tablet by mouth daily.   Yes [provider]  omeprazole (PRILOSEC) 20 MG capsule Take 20 mg by mouth 2 (two) times daily as needed (heartburn).   Yes [provider]  rivaroxaban (XARELTO) 20 MG TABS tablet Take 20 mg by mouth daily.   Yes [provider]  rosuvastatin (CRESTOR) 40 MG tablet Take 40 mg by mouth at bedtime.   Yes [provider]  Semaglutide (OZEMPIC, 1 MG/DOSE, [place]) Inject 1 mg into the skin once a week. Mondays   Yes [provider]  sertraline (ZOLOFT) 100 MG tablet Take 100 mg by mouth daily.   Yes [provider]  Testosterone 1.62 % GEL Place 4 Pump onto the skin daily. Apply to upper arm or shoulder areas  only   Yes [provider]    Allergies    Penicillins  Review of Systems   Review of Systems  Constitutional:  Positive for fatigue. Negative for activity change, appetite change, chills and fever.  HENT:  Negative for ear pain and sore throat.   Eyes:  Negative for pain and visual disturbance.  Respiratory:  Positive for chest tightness and shortness of breath. Negative for cough and wheezing.   Cardiovascular:  Positive for palpitations. Negative for leg swelling.  Gastrointestinal:  Negative for abdominal pain, diarrhea, nausea and vomiting.  Genitourinary:  Negative for dysuria, flank pain and hematuria.  Musculoskeletal:  Negative for arthralgias, back pain, myalgias and neck pain.  Skin:  Negative for color change and rash.  Neurological:  Positive for dizziness and light-headedness. Negative for seizures, syncope, speech difficulty, weakness and numbness.  Hematological:  Bruises/bleeds easily.  All other systems reviewed and are negative.  Physical Exam Updated Vital Signs BP 117/64   Pulse 92   Temp 98.1 F (36.7 C) (Oral)   Resp (!) 26   Ht 6\' 1"  (1.854 m)   Wt 115.7 kg   SpO2 97%   BMI 33.64 kg/m   Physical Exam Vitals and nursing note reviewed.  Constitutional:      General: He is not in acute distress.    Appearance: He is well-developed. He is not ill-appearing, toxic-appearing or diaphoretic.  HENT:     Head: Normocephalic and atraumatic.  Eyes:     Extraocular Movements: Extraocular movements intact.     Conjunctiva/sclera: Conjunctivae normal.  Neck:     Vascular: No JVD.  Cardiovascular:     Rate and Rhythm: Normal rate and regular rhythm.     Heart sounds: No murmur heard. Pulmonary:     Effort: Pulmonary effort is normal. No tachypnea or respiratory distress.     Breath sounds: Normal breath sounds. No wheezing or rales.  Chest:     Chest wall: No tenderness.  Abdominal:     Palpations: Abdomen is soft.     Tenderness: There is no  abdominal tenderness.  Musculoskeletal:     Cervical back: Normal range of motion and neck supple.     Right lower leg: No edema.     Left lower leg: No edema.  Skin:  General: Skin is warm.  Neurological:     General: No focal deficit present.     Mental Status: He is alert and oriented to person, place, and time.     Cranial Nerves: Cranial nerves are intact. No cranial nerve deficit, dysarthria or facial asymmetry.     Sensory: Sensation is intact. No sensory deficit.     Motor: Motor function is intact. No weakness or abnormal muscle tone.  Psychiatric:        Mood and Affect: Mood normal.        Behavior: Behavior normal.    ED Results / Procedures / Treatments   Labs (all labs ordered are listed, but only abnormal results are displayed) Labs Reviewed  COMPREHENSIVE METABOLIC PANEL - Abnormal; Notable for the following components:      Result Value   Glucose, Bld 184 (*)    Creatinine, Ser 1.67 (*)    Total Protein 8.3 (*)    Total Bilirubin 1.9 (*)    GFR, Estimated 45 (*)    All other components within normal limits  CBC WITH DIFFERENTIAL/PLATELET - Abnormal; Notable for the following components:   RBC 5.94 (*)    Hemoglobin 17.5 (*)    HCT 56.1 (*)    All other components within normal limits  HEMOGLOBIN A1C - Abnormal; Notable for the following components:   Hgb A1c MFr Bld 7.2 (*)    All other components within normal limits  BASIC METABOLIC PANEL - Abnormal; Notable for the following components:   Glucose, Bld 104 (*)    Creatinine, Ser 1.33 (*)    GFR, Estimated 60 (*)    All other components within normal limits  CBC - Abnormal; Notable for the following components:   HCT 53.2 (*)    All other components within normal limits  HEPARIN LEVEL (UNFRACTIONATED) - Abnormal; Notable for the following components:   Heparin Unfractionated >1.10 (*)    All other components within normal limits  CBG MONITORING, ED - Abnormal; Notable for the following components:    Glucose-Capillary 135 (*)    All other components within normal limits  CBG MONITORING, ED - Abnormal; Notable for the following components:   Glucose-Capillary 107 (*)    All other components within normal limits  CBG MONITORING, ED - Abnormal; Notable for the following components:   Glucose-Capillary 115 (*)    All other components within normal limits  RESP PANEL BY RT-PCR (FLU A&B, COVID) ARPGX2  MAGNESIUM  BRAIN NATRIURETIC PEPTIDE  TSH  MAGNESIUM  APTT  APTT  TROPONIN I (HIGH SENSITIVITY)  TROPONIN I (HIGH SENSITIVITY)    EKG EKG Interpretation  Date/Time:  Wednesday April 20 2021 15:04:39 EDT Ventricular Rate:  103 PR Interval:  237 QRS Duration: 88 QT Interval:  326 QTC Calculation: 427 R Axis:   -66 Text Interpretation: Sinus tachycardia with irregular rate Prolonged PR interval Inferior infarct, old Consider anterior infarct Confirmed by Gloris Manchester [phone]) on 04/20/2021 5:37:10 PM  Radiology DG Chest 2 View  Result Date: 04/20/2021 CLINICAL DATA:  Shortness of breath.  Chest pain EXAM: CHEST - 2 VIEW COMPARISON:  Chest x-ray 11/12/2020 FINDINGS: The heart and mediastinal contours are within normal limits. No focal consolidation. No pulmonary edema. No pleural effusion. No pneumothorax. No acute osseous abnormality. IMPRESSION: No active cardiopulmonary disease. Electronically Signed   By: Tish Frederickson M.D.   On: 04/20/2021 16:17    Procedures Procedures   Medications Ordered in ED Medications  losartan (COZAAR) tablet 50  mg (50 mg Oral Given 04/21/21 1015)  rosuvastatin (CRESTOR) tablet 40 mg (40 mg Oral Given 04/21/21 1015)  melatonin tablet 3 mg (has no administration in time range)  insulin glargine-yfgn (SEMGLEE) injection 27 Units (has no administration in time range)  insulin aspart (novoLOG) injection 0-9 Units (0 Units Subcutaneous Not Given 04/21/21 1255)  acetaminophen (TYLENOL) tablet 650 mg (has no administration in time range)    Or   acetaminophen (TYLENOL) suppository 650 mg (has no administration in time range)  oxyCODONE (Oxy IR/ROXICODONE) immediate release tablet 5 mg (has no administration in time range)  senna-docusate (Senokot-S) tablet 1 tablet (has no administration in time range)  heparin ADULT infusion 100 units/mL (25000 units/261mL) (1,500 Units/hr Intravenous New Bag/Given 04/21/21 0835)  flecainide (TAMBOCOR) tablet 50 mg (has no administration in time range)  metoprolol tartrate (LOPRESSOR) tablet 100 mg (has no administration in time range)    ED Course  I have reviewed the triage vital signs and the nursing notes.  Pertinent labs & imaging results that were available during my care of the patient were reviewed by me and considered in my medical decision making (see chart for details).    MDM Rules/Calculators/A&P HEAR Score: 4                        Patient presents for intermittent symptoms of shortness of breath and near syncope over the past 3 weeks.  Prior to arrival, he was seen at the [place] in [place].  He was instructed to come to the ED for further evaluation.  Prior to being bedded in the ED, diagnostic work-up was initiated.  EKG shows sinus rhythm.  Initial troponin was normal.  Lab work was notable for an increase in his hemoglobin.  Today it is 17.5 g/dL, increased from baseline of 13-15.  Patient does take testosterone gel, but has been taking it for years.  On initial assessment, patient is well-appearing.  He currently denies any symptoms but does state that symptoms have been present intermittently while he was in the ED waiting room.  Patient was placed on cardiac monitor.  Second troponin showed no elevation.  On review of patient's telemetry, he does appear to have frequently missed heartbeats.  Given his symptoms, this raises concern for second-degree heart block, type II.  I did discuss with cardiology who recommends admission to telemetry and echocardiogram.  They will round on him in  the morning.  In the ED, patient did get his nighttime doses of flecainide and metoprolol.  Cardiology advises to hold further doses of flecainide for now.  Patient was admitted for further management.  Final Clinical Impression(s) / ED Diagnoses Final diagnoses:  Heart block    Rx / DC Orders ED Discharge Orders     None        Gloris Manchester, MD 04/21/21 1332

## 2021-04-20 NOTE — ED Provider Notes (Signed)
Emergency Medicine Provider Triage Evaluation Note  Leroy Chen , a 65 y.o. male  was evaluated in triage.  Pt complains of chest tightness and shortness of breath.  Reports associated palpitations.  Symptoms started about 2 weeks ago.  Notes a history of A. fib and is anticoagulated on Xarelto.  States he has been compliant with his Xarelto.  Notes a history of cardioversion in the past.  No pedal edema.  He was evaluated at the [facility] and sent to the emergency department for further work-up.  Physical Exam  BP 112/87 (BP Location: Left Arm)   Pulse 82   Temp 98.1 F (36.7 C) (Oral)   Resp (!) 22   SpO2 97%  Gen:   Awake, no distress   Resp:  Normal effort  MSK:   Moves extremities without difficulty  Other:    Medical Decision Making  Medically screening exam initiated at 3:12 PM.  Appropriate orders placed.  [name] was informed that the remainder of the evaluation will be completed by another provider, this initial triage assessment does not replace that evaluation, and the importance of remaining in the ED until their evaluation is complete.   Placido Sou, PA-C 04/20/21 1514    Lorre Nick, MD 04/21/21 1004

## 2021-04-20 NOTE — ED Triage Notes (Signed)
Pt c/o CP x several weeks.  Was seen at the [place] today, EKG preformed and pt encouraged to come to ER.  Pt w/ hx  a. Fib.

## 2021-04-21 ENCOUNTER — Observation Stay (INDEPENDENT_AMBULATORY_CARE_PROVIDER_SITE_OTHER): Payer: No Typology Code available for payment source

## 2021-04-21 ENCOUNTER — Encounter (HOSPITAL_COMMUNITY): Payer: Self-pay | Admitting: Family Medicine

## 2021-04-21 ENCOUNTER — Other Ambulatory Visit: Payer: Self-pay | Admitting: Physician Assistant

## 2021-04-21 DIAGNOSIS — Z7901 Long term (current) use of anticoagulants: Secondary | ICD-10-CM | POA: Diagnosis not present

## 2021-04-21 DIAGNOSIS — I459 Conduction disorder, unspecified: Secondary | ICD-10-CM | POA: Diagnosis not present

## 2021-04-21 DIAGNOSIS — R0789 Other chest pain: Secondary | ICD-10-CM | POA: Diagnosis present

## 2021-04-21 DIAGNOSIS — R42 Dizziness and giddiness: Secondary | ICD-10-CM

## 2021-04-21 DIAGNOSIS — I4891 Unspecified atrial fibrillation: Secondary | ICD-10-CM | POA: Diagnosis not present

## 2021-04-21 DIAGNOSIS — Z20822 Contact with and (suspected) exposure to covid-19: Secondary | ICD-10-CM | POA: Diagnosis not present

## 2021-04-21 DIAGNOSIS — E1122 Type 2 diabetes mellitus with diabetic chronic kidney disease: Secondary | ICD-10-CM | POA: Diagnosis not present

## 2021-04-21 DIAGNOSIS — N1831 Chronic kidney disease, stage 3a: Secondary | ICD-10-CM

## 2021-04-21 DIAGNOSIS — Z794 Long term (current) use of insulin: Secondary | ICD-10-CM

## 2021-04-21 DIAGNOSIS — Z79899 Other long term (current) drug therapy: Secondary | ICD-10-CM | POA: Diagnosis not present

## 2021-04-21 DIAGNOSIS — I455 Other specified heart block: Secondary | ICD-10-CM | POA: Diagnosis not present

## 2021-04-21 DIAGNOSIS — I441 Atrioventricular block, second degree: Secondary | ICD-10-CM

## 2021-04-21 DIAGNOSIS — K5190 Ulcerative colitis, unspecified, without complications: Secondary | ICD-10-CM | POA: Insufficient documentation

## 2021-04-21 DIAGNOSIS — D689 Coagulation defect, unspecified: Secondary | ICD-10-CM | POA: Diagnosis not present

## 2021-04-21 DIAGNOSIS — E119 Type 2 diabetes mellitus without complications: Secondary | ICD-10-CM

## 2021-04-21 DIAGNOSIS — Z87891 Personal history of nicotine dependence: Secondary | ICD-10-CM | POA: Diagnosis not present

## 2021-04-21 DIAGNOSIS — I480 Paroxysmal atrial fibrillation: Secondary | ICD-10-CM | POA: Diagnosis not present

## 2021-04-21 LAB — BASIC METABOLIC PANEL
Anion gap: 7 (ref 5–15)
BUN: 20 mg/dL (ref 8–23)
CO2: 23 mmol/L (ref 22–32)
Calcium: 9.1 mg/dL (ref 8.9–10.3)
Chloride: 106 mmol/L (ref 98–111)
Creatinine, Ser: 1.33 mg/dL — ABNORMAL HIGH (ref 0.61–1.24)
GFR, Estimated: 60 mL/min — ABNORMAL LOW (ref >60)
Glucose, Bld: 104 mg/dL — ABNORMAL HIGH (ref 70–99)
Potassium: 3.9 mmol/L (ref 3.5–5.1)
Sodium: 136 mmol/L (ref 135–145)

## 2021-04-21 LAB — CBC
HCT: 53.2 % — ABNORMAL HIGH (ref 39.0–52.0)
Hemoglobin: 16.9 g/dL (ref 13.0–17.0)
MCH: 29.5 pg (ref 26.0–34.0)
MCHC: 31.8 g/dL (ref 30.0–36.0)
MCV: 92.8 fL (ref 80.0–100.0)
Platelets: 210 10*3/uL (ref 150–400)
RBC: 5.73 MIL/uL (ref 4.22–5.81)
RDW: 14.4 % (ref 11.5–15.5)
WBC: 8.5 10*3/uL (ref 4.0–10.5)
nRBC: 0 % (ref 0.0–0.2)

## 2021-04-21 LAB — CBG MONITORING, ED
Glucose-Capillary: 135 mg/dL — ABNORMAL HIGH (ref 70–99)
Glucose-Capillary: 107 mg/dL — ABNORMAL HIGH (ref 70–99)
Glucose-Capillary: 115 mg/dL — ABNORMAL HIGH (ref 70–99)
Glucose-Capillary: 149 mg/dL — ABNORMAL HIGH (ref 70–99)

## 2021-04-21 LAB — GLUCOSE, CAPILLARY: Glucose-Capillary: 203 mg/dL — ABNORMAL HIGH (ref 70–99)

## 2021-04-21 LAB — TSH: TSH: 2.536 u[IU]/mL (ref 0.350–4.500)

## 2021-04-21 LAB — HEMOGLOBIN A1C
Hgb A1c MFr Bld: 7.2 % — ABNORMAL HIGH (ref 4.8–5.6)
Mean Plasma Glucose: 159.94 mg/dL

## 2021-04-21 LAB — TROPONIN I (HIGH SENSITIVITY): Troponin I (High Sensitivity): 8 ng/L (ref <18)

## 2021-04-21 LAB — MAGNESIUM: Magnesium: 2 mg/dL (ref 1.7–2.4)

## 2021-04-21 LAB — RESP PANEL BY RT-PCR (FLU A&B, COVID) ARPGX2
Influenza A by PCR: NEGATIVE
Influenza B by PCR: NEGATIVE
SARS Coronavirus 2 by RT PCR: NEGATIVE

## 2021-04-21 LAB — HEPARIN LEVEL (UNFRACTIONATED): Heparin Unfractionated: >1.1 IU/mL — ABNORMAL HIGH (ref 0.30–0.70)

## 2021-04-21 LAB — BRAIN NATRIURETIC PEPTIDE: B Natriuretic Peptide: 20.6 pg/mL (ref 0.0–100.0)

## 2021-04-21 LAB — APTT
aPTT: 36 seconds (ref 24–36)
aPTT: >200 seconds (ref 24–36)

## 2021-04-21 MED ORDER — METOPROLOL TARTRATE 50 MG PO TABS
100.0000 mg | ORAL_TABLET | Freq: Two times a day (BID) | ORAL | Status: DC
  Administered 2021-04-21 – 2021-04-22 (×3): 100 mg via ORAL
  Filled 2021-04-21: qty 2
  Filled 2021-04-21: qty 4

## 2021-04-21 MED ORDER — ROSUVASTATIN CALCIUM 20 MG PO TABS
40.0000 mg | ORAL_TABLET | Freq: Every day | ORAL | Status: DC
  Administered 2021-04-21 – 2021-04-22 (×2): 40 mg via ORAL
  Filled 2021-04-21 (×2): qty 2

## 2021-04-21 MED ORDER — FLECAINIDE ACETATE 50 MG PO TABS: 50.0000 mg | ORAL_TABLET | Freq: Once | ORAL | Status: DC

## 2021-04-21 MED ORDER — HEPARIN (PORCINE) 25000 UT/250ML-% IV SOLN
1500.0000 [IU]/h | INTRAVENOUS | Status: DC
  Administered 2021-04-21: 1500 [IU]/h via INTRAVENOUS
  Filled 2021-04-21: qty 250

## 2021-04-21 MED ORDER — FLECAINIDE ACETATE 50 MG PO TABS
50.0000 mg | ORAL_TABLET | Freq: Two times a day (BID) | ORAL | Status: DC
  Administered 2021-04-21 – 2021-04-22 (×3): 50 mg via ORAL
  Filled 2021-04-21 (×3): qty 1

## 2021-04-21 MED ORDER — MELATONIN 3 MG PO TABS
3.0000 mg | ORAL_TABLET | Freq: Every day | ORAL | Status: DC
  Administered 2021-04-21: 3 mg via ORAL
  Filled 2021-04-21: qty 1

## 2021-04-21 MED ORDER — RIVAROXABAN 20 MG PO TABS
20.0000 mg | ORAL_TABLET | Freq: Every day | ORAL | Status: DC
  Administered 2021-04-21: 20 mg via ORAL
  Filled 2021-04-21: qty 1

## 2021-04-21 MED ORDER — INSULIN ASPART 100 UNIT/ML IJ SOLN
0.0000 [IU] | INTRAMUSCULAR | Status: DC
  Administered 2021-04-21: 3 [IU] via SUBCUTANEOUS
  Administered 2021-04-22: 5 [IU] via SUBCUTANEOUS
  Filled 2021-04-21: qty 0.09

## 2021-04-21 MED ORDER — INSULIN GLARGINE-YFGN 100 UNIT/ML ~~LOC~~ SOLN
27.0000 [IU] | Freq: Every day | SUBCUTANEOUS | Status: DC
  Filled 2021-04-21: qty 0.27

## 2021-04-21 MED ORDER — ACETAMINOPHEN 325 MG PO TABS: 650.0000 mg | ORAL_TABLET | Freq: Four times a day (QID) | ORAL | Status: DC | PRN

## 2021-04-21 MED ORDER — OXYCODONE HCL 5 MG PO TABS: 5.0000 mg | ORAL_TABLET | ORAL | Status: DC | PRN

## 2021-04-21 MED ORDER — SENNOSIDES-DOCUSATE SODIUM 8.6-50 MG PO TABS: 1.0000 | ORAL_TABLET | Freq: Every evening | ORAL | Status: DC | PRN

## 2021-04-21 MED ORDER — ACETAMINOPHEN 650 MG RE SUPP: 650.0000 mg | Freq: Four times a day (QID) | RECTAL | Status: DC | PRN

## 2021-04-21 MED ORDER — LOSARTAN POTASSIUM 50 MG PO TABS
50.0000 mg | ORAL_TABLET | Freq: Every day | ORAL | Status: DC
  Administered 2021-04-21 – 2021-04-22 (×2): 50 mg via ORAL
  Filled 2021-04-21: qty 1
  Filled 2021-04-21: qty 2

## 2021-04-21 NOTE — Progress Notes (Signed)
   04/21/21 1820  Provider Notification  Provider Name/Title B. Dahal MD  Date Provider Notified 04/21/21  Time Provider Notified 1820  Notification Type Page  Notification Reason Critical result  Test performed and critical result PTT >200  Date Critical Result Received 04/21/21  Time Critical Result Received 1820  Provider response Other (Comment) (Awaiting response)

## 2021-04-21 NOTE — ED Provider Notes (Signed)
  Physical Exam  BP (!) 145/95   Pulse (!) 108   Temp 98.1 F (36.7 C) (Oral)   Resp 15   Ht 6\' 1"  (1.854 m)   Wt 115.7 kg   SpO2 100%   BMI 33.64 kg/m   Physical Exam  ED Course/Procedures     Procedures  MDM  Care assumed from Dr. at 12 am.  Patient has a history of A. fib and is anticoagulated.  Patient apparently has some missed beats and concern for possible secondary heart block.  He talked to cardiology Dr. Durwin Nora, who recommend hospitalist admission and transfer to [facility] for an echo.  Patient received his nighttime dose of flecainide and metoprolol while in the ED prior to signout.  1:39 AM Patient admitted to the hospital service.  Cardiology recommend holding flecainide and metoprolol.      [facility], MD 04/21/21 [phone]

## 2021-04-21 NOTE — Consult Note (Addendum)
Cardiology Consultation:   Patient ID: Leroy Chen MRN: [id number]; DOB: 06/25/1956  Admit date: 04/20/2021 Date of Consult: 04/21/2021  PCP:  [name]   [facility] Providers Cardiologist:  Maisie Fus, MD , received cardiology care through [facility] Dr. Carleene Cooper    Patient Profile:   Leroy Chen is a 65 y.o. male with a hx of PAF on xarelto, DM, OSA on CPAP, HTN, HLD, obesity, former smoking, and ED who is being seen 04/21/2021 for the evaluation irregular rhythm  History of Present Illness:   Leroy Chen follows with cardiology at the [facility]. He is anticoagulated with xarelto for history of paroxysmal Afib. He was cardioverted in 2021. He is on flecainide and  metoprolol 75 mg BID. Hypertension is controlled with losartan. He has a history of a 1st degree heart block. He had a nonischemic stress test in May 2021 at the [place]. Echo 04/2020 with normal LVEF and no significant valvular disease mentioned. Zio patch was worn and captured second degree Mobitz type 1. He remained on flecainide and BB.  He presented to the [facility] 04/20/21 with CP and shortness of breath x several weeks. Apparently, there was concern for high grade AV block and he was advised to to present to the ER. Overnight cardiology fellow was called and recommended holding further flecainide and metoprolol and transfer to [facility].  Last dose of xarelto was 04/20/21.   During my interview, he reports symptoms started 2 weeks ago when he was at the beach relaxing. He has episodes 2-3 times daily in which he feels shortness of breath, heart racing and palpitations, and chest tightness without radiation. Episodes last a few minutes and sometimes he becomes lightheaded and dizzy. He denies frank syncope. He is active and works in the yard and just cleaned out the garage without exertional chest pain. He has a baseline first degree heart block.   He denies history of MI, PCI, and stroke. While mentioned in the  chart, he denies history of renal transplant and is on no immunosuppressants.   EKG reviewed and appears sinus tachycardia with first degree heart block and inferior Q waves. Telemetry reviewed and appears he is having intermittent second degree type 1 HB. No significant pauses.   Last doses of flecainide and metoprolol were 2359 04/20/21.   Past Medical History:  Diagnosis Date   Atrial fibrillation [facility]) March 2015   Diabetes mellitus without complication (HCC)    NIDDM, type II   High cholesterol    Hydradenitis    Sebaceous cyst    hx of   Sigmoid diverticulitis    Wears glasses     Past Surgical History:  Procedure Laterality Date   COLECTOMY  10/15/14   hand sugery     ILEO LOOP COLOSTOMY CLOSURE N/A 04/27/2015   Procedure: DIAGNOSTIC LAPAROSCOPY,  EXPLORATORY LAPAROTOMY WITH RESECTION AND CLOSURE OF COLOSTOMY ;  Surgeon: [name], MD;  Location: [facility] OR;  Service: General;  Laterality: N/A;   LAPAROTOMY N/A 10/15/2014   Procedure: EXPLORATORY LAPAROTOMY WITH DRAINAGE OF INTRA- ABDOMINAL ABSCESS;  Surgeon: [name], MD;  Location: [facility] OR;  Service: General;  Laterality: N/A;   ORCHIECTOMY     PARTIAL COLECTOMY N/A 10/15/2014   Procedure: SIGMOID COLECTOMY WITH COLOSTOMY CREATION;  Surgeon: [name], MD;  Location: [facility] OR;  Service: General;  Laterality: N/A;   PILONIDAL CYST EXCISION N/A 01/01/2015   Procedure: EXCISION OF PILONIDAL DISEASE;  Surgeon: Romie Levee, MD;  Location: [facility];  Service: General;  Laterality: N/A;     Home Medications:  Prior to Admission medications   Medication Sig Start Date End Date Taking? Authorizing Provider  acetaminophen (TYLENOL) 500 MG tablet Take 1,000 mg by mouth 2 (two) times daily.   Yes [provider]  Adalimumab 40 MG/0.4ML PNKT Inject 40 mg into the skin once a week. Mondays   Yes [provider]  albuterol (PROVENTIL HFA;VENTOLIN HFA) 108 (90 Base) MCG/ACT inhaler Inhale 1-2  puffs into the lungs every 6 (six) hours as needed for wheezing or shortness of breath. 08/05/17  Yes Wallis Bamberg, PA-C  Chlorhexidine Gluconate 4 % SOLN Apply 1 application topically See admin instructions. Apply to armpits & Groin 3 times a week in the shower. Leave on for 1 minute and then rinse   Yes [provider]  clindamycin (CLEOCIN T) 1 % lotion Apply 1 application topically 2 (two) times daily. Draining bumps 03/14/19  Yes [provider]  clobetasol (TEMOVATE) 0.05 % external solution Apply 1 application topically 2 (two) times daily as needed (infection on scalp).   Yes [provider]  dicyclomine (BENTYL) 10 MG capsule Take 10 mg by mouth 3 (three) times daily as needed for spasms.   Yes [provider]  empagliflozin (JARDIANCE) 25 MG TABS tablet Take 12.5 mg by mouth daily.   Yes [provider]  Ensure (ENSURE) Take 237 mLs by mouth daily.   Yes [provider]  ergocalciferol (VITAMIN D2) 1.25 MG (50000 UT) capsule Take 50,000 Units by mouth once a week. Mondays   Yes [provider]  flecainide (TAMBOCOR) 50 MG tablet Take 50 mg by mouth 2 (two) times daily.   Yes [provider]  Fluocinolone Acetonide 0.01 % OIL Place 1 application in ear(s) 2 (two) times daily as needed (scaling).   Yes [provider]  HYDROcodone-acetaminophen (NORCO/VICODIN) 5-325 MG tablet Take 1 tablet by mouth every 6 (six) hours as needed for severe pain.   Yes [provider]  ibuprofen (ADVIL) 200 MG tablet Take 600 mg by mouth every 6 (six) hours as needed for moderate pain.   Yes [provider]  insulin glargine (LANTUS) 100 UNIT/ML Solostar Pen Inject 55 Units into the skin every evening. 04/03/19  Yes [provider]  losartan (COZAAR) 50 MG tablet Take 50 mg by mouth daily.   Yes [provider]  lurasidone (LATUDA) 40 MG TABS tablet Take 40 mg by mouth daily.   Yes [provider]  melatonin 3 MG TABS tablet Take 3 mg by mouth at bedtime.   Yes [provider]  metFORMIN (GLUCOPHAGE) 500 MG tablet Take 1,000 mg by mouth 2 (two) times daily with a meal.   Yes [provider]  metoprolol tartrate (LOPRESSOR) 50 MG tablet Take 75 mg by mouth 2 (two) times daily.   Yes [provider]  Multiple Vitamin (MULTIVITAMIN) tablet Take 1 tablet by mouth daily.   Yes [provider]  omeprazole (PRILOSEC) 20 MG capsule Take 20 mg by mouth 2 (two) times daily as needed (heartburn).   Yes [provider]  rivaroxaban (XARELTO) 20 MG TABS tablet Take 20 mg by mouth daily.   Yes [provider]  rosuvastatin (CRESTOR) 40 MG tablet Take 40 mg by mouth at bedtime.   Yes [provider]  Semaglutide (OZEMPIC, 1 MG/DOSE, Ewa Gentry) Inject 1 mg into the skin once a week. Mondays   Yes [provider]  sertraline (ZOLOFT) 100 MG tablet Take 100 mg by mouth daily.   Yes [provider]  Testosterone 1.62 % GEL Place 4 Pump onto the skin daily. Apply to upper arm or shoulder areas only   Yes [provider]    Inpatient Medications: Scheduled Meds:  insulin aspart  0-9 Units Subcutaneous Q4H   insulin glargine-yfgn  27 Units Subcutaneous QHS   losartan  50 mg Oral Daily   melatonin  3 mg Oral QHS   rosuvastatin  40 mg Oral Daily   Continuous Infusions:  heparin 1,500 Units/hr (04/21/21 0835)   PRN Meds: acetaminophen **OR** acetaminophen, oxyCODONE, senna-docusate  Allergies:    Allergies  Allergen Reactions   Penicillins Swelling    Social History:   Social History   Socioeconomic History   Marital status: Married    Spouse name: Not on file   Number of children: Not on file   Years of education: Not on file   Highest education level: Not on file  Occupational History   Not on file  Tobacco Use   Smoking status: Former    Packs/day: 0.25    Years: 20.00    Pack years:  5.00    Types: Cigarettes    Quit date: 2018    Years since quitting: 4.7   Smokeless tobacco: Never  Vaping Use   Vaping Use: Never used  Substance and Sexual Activity   Alcohol use: Yes    Comment: Social- moderate - one beer at the wedding     Drug use: No   Sexual activity: Yes    Birth control/protection: Condom  Other Topics Concern   Not on file  Social History Narrative   Not on file   Social Determinants of Health   Financial Resource Strain: Not on file  Food Insecurity: Not on file  Transportation Needs: Not on file  Physical Activity: Not on file  Stress: Not on file  Social Connections: Not on file  Intimate Partner Violence: Not on file    Family History:    Family History  Problem Relation Age of Onset   Healthy Mother      ROS:  Please see the history of present illness.  All other ROS reviewed and negative.     Physical Exam/Data:   Vitals:   04/21/21 0730 04/21/21 0745 04/21/21 0800 04/21/21 1012  BP: (!) 161/120  (!) 152/101 (!) 167/111  Pulse: 100 90 (!) 101 100  Resp: (!) 25 20 (!) 28 (!) 28  Temp:      TempSrc:      SpO2: 95% 95% 93% 99%  Weight:      Height:        Intake/Output Summary (Last 24 hours) at 04/21/2021 1219 Last data filed at 04/21/2021 1155 Gross per 24 hour  Intake --  Output 500 ml  Net -500 ml   Last 3 Weights 04/20/2021 11/13/2020 04/27/2015  Weight (lbs) 255 lb 248 lb 9.6 oz 251 lb  Weight (kg) 115.667 kg 112.764 kg 113.853 kg     Body mass index is 33.64 kg/m.  General:  Well nourished, well developed, in no acute distress HEENT: normal Neck: no JVD Vascular: No carotid bruits; Distal pulses 2+ bilaterally Cardiac:  normal S1, S2; RRR; no murmur  Lungs:  clear to auscultation bilaterally, no wheezing, rhonchi or rales  Abd: soft, nontender, no hepatomegaly  Ext: no edema Musculoskeletal:  No deformities, BUE and BLE strength normal and equal Skin:  warm and dry  Neuro:  CNs 2-12 intact, no focal  abnormalities noted Psych:  Normal affect   EKG:  The EKG was personally reviewed and demonstrates:   10/20/2020: midnight AT p wave morphology different from sinus p waves. Q waves inferiorly. 1st degree av block 10/19/2020: sinus tach with intermittent Wenckebach. 1st degree AV block. Q waves inferiorly.   Telemetry:  Telemetry was personally reviewed and demonstrates:  AT with first degree heart block and intermittent Wenckebach HR in the 80s, higher rates consistent with atrial tachycardia in the 100-110s  Relevant CV Studies:  Echo pending   #Cardiac stress test 11/27/19 1. There is fixed perfusion defects in the basal to mid segments of the  inferior  wall and the entire inferolateral wall with normal wall motion, consistent  with  attenuation artifacts. No reversible perfusion defects seen to suggest  myocardial ischemia. 2. Overall left ventricle systolic function is normal. The overall calculated  left ventricular ejection fraction is 57 %. No left ventricular regional wall  motion abnormalities. 3. No TID. 4. Negative for myocardial ischemia by ECG criteria with low sensitivity.  #TTE: 05/06/20 There is mild concentric left ventricular hypertrophy. The left ventricle is normal in size and systolic function: Ejection Fraction= 60-65% The right ventricle is normal in size and function No significant valvular regurgitation or stenosis The IVC is normal in size with an inspiratory collapse of greater then 50%,  suggesting normal right atrial pressure. There is no pericardial effusion. The left atrial size is normal. Left atrial volume index is 21.4 mL/m2.  #ZIO February 2022: Impression: Baseline rhythm is sinus Normal heart rate variability and no prolonged pauses or high grade AV block Periods of AV wenckebach are likely underdiagnosed on this monitor Rare PVCs and rare PACs Symptoms and patient triggered events appear to partially correspond to periods  of AV  wenckebach at relatively high atrial rates  Laboratory Data:  High Sensitivity Troponin:   Recent Labs  Lab 04/20/21 1532 04/21/21 0000  TROPONINIHS 6 8     Chemistry Recent Labs  Lab 04/20/21 1532 04/21/21 0449  NA 142 136  K 4.9 3.9  CL 105 106  CO2 26 23  GLUCOSE 184* 104*  BUN 19 20  CREATININE 1.67* 1.33*  CALCIUM 9.8 9.1  MG 2.0 2.0  GFRNONAA 45* 60*  ANIONGAP 11 7    Recent Labs  Lab 04/20/21 1532  PROT 8.3*  ALBUMIN 4.0  AST 22  ALT 20  ALKPHOS 53  BILITOT 1.9*   Lipids No results for input(s): CHOL, TRIG, HDL, LABVLDL, LDLCALC, CHOLHDL in the last 168 hours.  Hematology Recent Labs  Lab 04/20/21 1532 04/21/21 0449  WBC 8.4 8.5  RBC 5.94* 5.73  HGB 17.5* 16.9  HCT 56.1* 53.2*  MCV 94.4 92.8  MCH 29.5 29.5  MCHC 31.2 31.8  RDW 14.5 14.4  PLT 239 210   Thyroid  Recent Labs  Lab 04/21/21 0449  TSH 2.536    BNP Recent Labs  Lab 04/21/21 0000  BNP 20.6    DDimer No results for input(s): DDIMER in the last 168 hours.   Radiology/Studies:  DG Chest 2 View  Result Date: 04/20/2021 CLINICAL DATA:  Shortness of breath.  Chest pain EXAM: CHEST - 2 VIEW COMPARISON:  Chest x-ray 11/12/2020 FINDINGS: The heart and mediastinal contours are within normal limits. No focal consolidation. No pulmonary edema. No pleural effusion. No pneumothorax. No acute osseous abnormality. IMPRESSION: No active cardiopulmonary disease. Electronically Signed  By: Tish Frederickson M.D.   On: 04/20/2021 16:17     Assessment and Plan:   First degree heart block Intermittent second degree type 1 heart block Atrial tachycardia - has been associated with SOB, chest tightness, and lightheadedness, no syncope - suspect atrial tachycardia may be responsible for his heart fluttering/palpitations - resume flecainide - will increase metoprolol tartrate to 100 mg BID - no PPM indicated at this time - will order a 14 day zio - if echo without new findings, may discharge  and follow up with [facility] cardiology sees Dr. Carleene Cooper   PAF - sinus rhythm / AT   Chronic anticoagulation This patients CHA2DS2-VASc Score and unadjusted Ischemic Stroke Rate (% per year) is equal to 2.2 % stroke rate/year from a score of 2 (HTN, DM) - has been transitioned to heparin gtt - resume xarelto if no procedures planned   Chest tightness, SOB - in the setting of heart fluttering/palpitations along with dyspnea - will obtain echocardiogram - hs troponin x 2 negative - EKG with inferior Q waves seen in prior tracings - BNP WNL - if abnormal, will need to consider ischemic evaluation but lower suspicion for ACS   DM - A1c 7.2% - per medicine   OSA on CPAP - compliant   CKD II-III - sCr 1.33 (1.5-1.6 baseline?)   Echo pending. Will order zio patch to be mailed to him. Follow up with [facility] cardiology.   Risk Assessment/Risk Scores:     HEAR Score (for undifferentiated chest pain):  HEAR Score: 4    CHA2DS2-VASc Score = 2 This indicates a 2.2% annual risk of stroke. The patient's score is based upon: CHF History: 0 HTN History: 1 Diabetes History: 1 Stroke History: 0 Vascular Disease History: 0 Age Score: 0 Gender Score: 0      For questions or updates, please contact [facility] Please consult [email or web address] for contact info under    Signed, Marcelino Duster, PA  04/21/2021 12:19 PM  Addendum Leroy Chen with hx above, presents with subacute frequent episodes SOB/chest tightness and palpitations. He unlikely has ischemic dx with atypical cp , negative troponin no acute st-t changes. He has hx of pAF in sinus rhythm on flecainide 50 mg BID and metop 75 mg tartrate BID. On tele he has intermittent Wenckebach and AT. Increased his metoprolol and continued his flecainide. As well as place a zio patch to monitor his rhythm. Will follow up his results and symptoms. [facility] as [facility]  [facility].  Physical Exam Physical Exam Neuro: alert and oriented CV: tachycardic, no murmurs Vasc: 2+ radial pulses Pulm: CLAB Abd: non distended Ext: No LE edema Skin: warm and well perfused Psych: normal mood

## 2021-04-21 NOTE — Discharge Planning (Signed)
Pt active at [facility] PA: Izola Price SW: Mallie Darting   Pager: [phone] Desk phone: [phone]

## 2021-04-21 NOTE — Progress Notes (Unsigned)
Patient enrolled for [employer] to mail a 14 day ZIO XT monitor to his address on file. 

## 2021-04-21 NOTE — Progress Notes (Signed)
PROGRESS NOTE  Leroy Chen  DOB: 30-Nov-1955  PCP: [facility] ZOX:[id number]  DOA: 04/20/2021  LOS: 0 days  Hospital Day: 2   Chief Complaint  Patient presents with   Chest Pain    Brief narrative: Leroy Chen is a 65 y.o. male with PMH significant for DM2, HTN, HLD, former smoking, obesity, atrial fibrillation on Xarelto, hidradenitis suppurativa.  Patient presented to the ED on 10/5 for evaluation of palpitation and a concern for heart block at outpatient clinic.  Patient reports several weeks history of palpitations that last 1 to 2 minutes occurring at various settings at rest and exertion.  Also described associated shortness of breath, no loss of consciousness.  He was unable to get an appointment with his cardiologist at [facility] and hence went to the walk-in clinic.  EKG raised concern of intermittent high-grade heart block and hence sent to the ED for further evaluation and management.  In the ED, patient was afebrile, hemodynamically stable EKG showed irregular rhythm with rate in the 110s and prolonged PR interval.   Chest x-ray was negative for acute cardiopulmonary disease.   Labs showed creatinine elevated 1.67,  Troponin and BNP normal   Per on-call cardiology recommendation, patient was observed overnight.  Metoprolol and flecainide were held. Admitted to hospitalist service Seen by cardiology this morning See below for details  Subjective: Patient was seen and examined this morning.  Pleasant elderly African-American male.  Sitting up in bed.  Not in distress.  No new symptoms.  Feels better than at presentation.  Assessment/Plan: First-degree AV block Intermittent second-degree AV block Atrial tachycardia -Presented with symptoms of shortness of breath, chest tightness, lightheadedness without syncope -Cardiology consult appreciated.  Likely has atrial tachycardia.  Flecainide was resumed.  Metoprolol tartrate dose was increased 200 mg twice  daily.  Per cardiology, pacemaker placement is not indicated at this time.  A Zio patch was placed.  Echocardiogram pending at this time.   Paroxysmal A. fib -Hx of AF for ~7 yrs, had DCCV in 2021  -Chronically on Xarelto.  No procedure planned per cardiology.  Will resume Xarelto.   Insulin-dependent DM  -A1c was 7.3% in April 2022  -Continue CBG checks and insulin    Type 2 diabetes mellitus -A1c 7.2 on 10/6 -Home meds include Ozempic weekly, Lantus 55 units nightly, Jardiance 12.5 mg daily, metformin 1000 mg twice daily. -It seems that patient has not received any insulin so far and his glucose level was controlled close to 100 this morning.  Probably because he was n.p.o. -For tonight, Lantus has been ordered for a lower dose of 27 units.  We will monitor Accu-Cheks with that.  Continue sliding scale insulin.  I would stop metformin at this time because of elevated creatinine. Recent Labs  Lab 04/21/21 0433 04/21/21 0808 04/21/21 1254 04/21/21 1631  GLUCAP 135* 107* 115* 149*   Hyperlipidemia -Crestor  CKD IIIa  - SCr is 1.67 on admission, improved to 1.33 today. Recent Labs    11/10/20 1212 11/12/20 1719 11/13/20 0400 04/20/21 1532 04/21/21 0449  BUN 31* 34* 36* 19 20  CREATININE 1.84* 1.69* 1.54* 1.67* 1.33*   Hidradenitis suppurativa -Weekly adalimumab, clindamycin cream, clobetasol cream,  Depression -Latuda, sertraline   Mobility: Encourage ambulation Code Status:   Code Status: Full Code  Nutritional status: Body mass index is 33.64 kg/m.     Diet:  Diet Order             Diet regular Room  service appropriate? Yes; Fluid consistency: Thin  Diet effective now                  DVT prophylaxis: rivaroxaban (XARELTO) tablet 20 mg   Antimicrobials: None Fluid: None Consultants: Cardiology Family Communication: None at bedside  Status is: Observation Remains inpatient appropriate because: Pending echocardiogram  Dispo: The patient is from:  Home              Anticipated d/c is to: Home likely tomorrow              Patient currently is not medically stable to d/c.   Difficult to place patient No     Infusions:     Scheduled Meds:  flecainide  50 mg Oral Q12H   insulin aspart  0-9 Units Subcutaneous Q4H   insulin glargine-yfgn  27 Units Subcutaneous QHS   losartan  50 mg Oral Daily   melatonin  3 mg Oral QHS   metoprolol tartrate  100 mg Oral BID   rivaroxaban  20 mg Oral Q supper   rosuvastatin  40 mg Oral Daily    Antimicrobials: Anti-infectives (From admission, onward)    None       PRN meds: acetaminophen **OR** acetaminophen, oxyCODONE, senna-docusate   Objective: Vitals:   04/21/21 1230 04/21/21 1625  BP: 117/64 109/88  Pulse: 92 (!) 107  Resp: (!) 26 20  Temp:    SpO2: 97% 95%    Intake/Output Summary (Last 24 hours) at 04/21/2021 1635 Last data filed at 04/21/2021 1155 Gross per 24 hour  Intake --  Output 500 ml  Net -500 ml   Filed Weights   04/20/21 1515  Weight: 115.7 kg   Weight change:  Body mass index is 33.64 kg/m.   Physical Exam: General exam: Pleasant, elderly African-American male Skin: No rashes, lesions or ulcers. HEENT: Atraumatic, normocephalic, no obvious bleeding Lungs: Clear to auscultation bilaterally CVS: Regular rate and rhythm, no murmur GI/Abd soft, nontender, nondistended, bowel sound present CNS: Alert, awake, oriented x3 Psychiatry: Mood appropriate Extremities: No pedal edema, no calf tenderness  Data Review: I have personally reviewed the laboratory data and studies available.  Recent Labs  Lab 04/20/21 1532 04/21/21 0449  WBC 8.4 8.5  NEUTROABS 4.8  --   HGB 17.5* 16.9  HCT 56.1* 53.2*  MCV 94.4 92.8  PLT 239 210   Recent Labs  Lab 04/20/21 1532 04/21/21 0449  NA 142 136  K 4.9 3.9  CL 105 106  CO2 26 23  GLUCOSE 184* 104*  BUN 19 20  CREATININE 1.67* 1.33*  CALCIUM 9.8 9.1  MG 2.0 2.0    F/u labs ordered Unresulted Labs  (From admission, onward)     Start     Ordered   04/21/21 1430  APTT  Once,   R        04/21/21 1047   04/21/21 0500  Basic metabolic panel  Daily,   R      04/21/21 0345   04/21/21 0500  CBC  Daily,   R      04/21/21 0345   04/21/21 0500  Magnesium  Daily,   R      04/21/21 0345            Signed, Lorin Glass, MD [facility] 04/21/2021

## 2021-04-21 NOTE — ED Notes (Signed)
Pt. CBG 135, RN, Huntley Dec made aware.

## 2021-04-21 NOTE — Plan of Care (Signed)
  Problem: Education: Goal: Knowledge of General Education information will improve Description: Including pain rating scale, medication(s)/side effects and non-pharmacologic comfort measures Outcome: Progressing   Problem: Clinical Measurements: Goal: Ability to maintain clinical measurements within normal limits will improve Outcome: Progressing   Problem: Activity: Goal: Risk for activity intolerance will decrease Outcome: Progressing   Problem: Nutrition: Goal: Adequate nutrition will be maintained Outcome: Progressing   Problem: Safety: Goal: Ability to remain free from injury will improve Outcome: Progressing   

## 2021-04-21 NOTE — Progress Notes (Signed)
ANTICOAGULATION CONSULT NOTE - Initial Consult  Pharmacy Consult for heparin Indication: atrial fibrillation  Allergies  Allergen Reactions   Penicillins Swelling    Patient Measurements: Height: 6\' 1"  (185.4 cm) Weight: 115.7 kg (255 lb) IBW/kg (Calculated) : 79.9 Heparin Dosing Weight: 105kg  Vital Signs: BP: 145/95 (10/06 0000) Pulse Rate: 108 (10/06 0000)  Labs: Recent Labs    04/20/21 1532 04/21/21 0000  HGB 17.5*  --   HCT 56.1*  --   PLT 239  --   CREATININE 1.67*  --   TROPONINIHS 6 8    Estimated Creatinine Clearance: 59.5 mL/min (A) (by C-G formula based on SCr of 1.67 mg/dL (H)).   Medical History: Past Medical History:  Diagnosis Date   Atrial fibrillation [facility]) March 2015   Diabetes mellitus without complication (HCC)    NIDDM, type II   High cholesterol    Hydradenitis    Sebaceous cyst    hx of   Sigmoid diverticulitis    Wears glasses      Assessment: 65 yo male complains of chest tightness and SOB.  Hx of Afib on xarelto.  Pharmacy consulted to dose heparin drip, LD of xarelto 10/5 am  Hgb 17.5, Plts 239, Scr 1.67   Goal of Therapy:  Heparin level 0.3-0.7 units/ml aPTT 66-102 seconds Monitor platelets by anticoagulation protocol: Yes   Plan:  Baseline labs ordered STAT Starting at 0800 begin heparin drip at 1500 units/hr aPTT in 6 hours Daily CBC  77 RPh 04/21/2021, 3:55 AM

## 2021-04-21 NOTE — H&P (Signed)
History and Physical    Leroy Chen:[id number] DOB: 1956-05-21 DOA: 04/20/2021  PCP: [name]   Patient coming from: Home   Chief Complaint: palpitations, SOB   HPI: Leroy Chen is a 65 y.o. male with medical history significant for atrial fibrillation on Xarelto, hidradenitis suppurativa, insulin-dependent diabetes mellitus, and OSA on CPAP, now presenting to the emergency department for evaluation of palpitations and concern for heart block at his outpatient clinic.  Patient reports several week history of palpitations.  He describes cute onset of shortness of breath and palpitations lasting 1 to 2 minutes and occurring in various settings including with exertion and while at rest.  He denies any loss of consciousness.  He follows with cardiology at the [facility], was unable to get an appointment with his cardiologist and so he was seen at a [facility] walk-in clinic where there was concern for intermittent high-grade heart block and he was referred to the emergency department for further evaluation.  ED Course: Upon arrival to the ED, patient is found to be afebrile, saturating well on room air, and mildly tachycardic with stable blood pressure.  EKG features and irregular rhythm with rate in the 110s and prolonged PR interval.  Chest x-ray is negative for acute cardiopulmonary disease.  Chemistry panel with creatinine 1.67.  CBC with mild polycythemia.  Troponin and BNP are normal.  Patient was treated with flecainide and metoprolol in the emergency department, cardiology was consulted by the ED physician, and medical admission to [facility] was recommended.  Review of Systems:  All other systems reviewed and apart from HPI, are negative.  Past Medical History:  Diagnosis Date   Atrial fibrillation [facility]) March 2015   Diabetes mellitus without complication (HCC)    NIDDM, type II   High cholesterol    Hydradenitis    Sebaceous cyst    hx of   Sigmoid diverticulitis     Wears glasses     Past Surgical History:  Procedure Laterality Date   COLECTOMY  10/15/14   hand sugery     ILEO LOOP COLOSTOMY CLOSURE N/A 04/27/2015   Procedure: DIAGNOSTIC LAPAROSCOPY,  EXPLORATORY LAPAROTOMY WITH RESECTION AND CLOSURE OF COLOSTOMY ;  Surgeon: [name], MD;  Location: [facility] OR;  Service: General;  Laterality: N/A;   LAPAROTOMY N/A 10/15/2014   Procedure: EXPLORATORY LAPAROTOMY WITH DRAINAGE OF INTRA- ABDOMINAL ABSCESS;  Surgeon: [name], MD;  Location: [facility] OR;  Service: General;  Laterality: N/A;   ORCHIECTOMY     PARTIAL COLECTOMY N/A 10/15/2014   Procedure: SIGMOID COLECTOMY WITH COLOSTOMY CREATION;  Surgeon: [name], MD;  Location: [facility] OR;  Service: General;  Laterality: N/A;   PILONIDAL CYST EXCISION N/A 01/01/2015   Procedure: EXCISION OF PILONIDAL DISEASE;  Surgeon: Romie Levee, MD;  Location: [facility];  Service: General;  Laterality: N/A;    Social History:   reports that he quit smoking about 4 years ago. His smoking use included cigarettes. He has a 5.00 pack-year smoking history. He has never used smokeless tobacco. He reports current alcohol use. He reports that he does not use drugs.  Allergies  Allergen Reactions   Penicillins Swelling    Family History  Problem Relation Age of Onset   Healthy Mother      Prior to Admission medications   Medication Sig Start Date End Date Taking? Authorizing Provider  acetaminophen (TYLENOL) 500 MG tablet Take 1,000 mg by mouth 2 (two) times daily.   Yes [provider]  Adalimumab 40 MG/0.4ML PNKT Inject 40 mg into the skin once a week. Mondays   Yes [provider]  albuterol (PROVENTIL HFA;VENTOLIN HFA) 108 (90 Base) MCG/ACT inhaler Inhale 1-2 puffs into the lungs every 6 (six) hours as needed for wheezing or shortness of breath. 08/05/17  Yes Wallis Bamberg, PA-C  Chlorhexidine Gluconate 4 % SOLN Apply 1 application topically See admin instructions. Apply to  armpits & Groin 3 times a week in the shower. Leave on for 1 minute and then rinse   Yes [provider]  clindamycin (CLEOCIN T) 1 % lotion Apply 1 application topically 2 (two) times daily. Draining bumps 03/14/19  Yes [provider]  clobetasol (TEMOVATE) 0.05 % external solution Apply 1 application topically 2 (two) times daily as needed (infection on scalp).   Yes [provider]  dicyclomine (BENTYL) 10 MG capsule Take 10 mg by mouth 3 (three) times daily as needed for spasms.   Yes [provider]  empagliflozin (JARDIANCE) 25 MG TABS tablet Take 12.5 mg by mouth daily.   Yes [provider]  Ensure (ENSURE) Take 237 mLs by mouth daily.   Yes [provider]  ergocalciferol (VITAMIN D2) 1.25 MG (50000 UT) capsule Take 50,000 Units by mouth once a week. Mondays   Yes [provider]  flecainide (TAMBOCOR) 50 MG tablet Take 50 mg by mouth 2 (two) times daily.   Yes [provider]  Fluocinolone Acetonide 0.01 % OIL Place 1 application in ear(s) 2 (two) times daily as needed (scaling).   Yes [provider]  HYDROcodone-acetaminophen (NORCO/VICODIN) 5-325 MG tablet Take 1 tablet by mouth every 6 (six) hours as needed for severe pain.   Yes [provider]  ibuprofen (ADVIL) 200 MG tablet Take 600 mg by mouth every 6 (six) hours as needed for moderate pain.   Yes [provider]  insulin glargine (LANTUS) 100 UNIT/ML Solostar Pen Inject 55 Units into the skin every evening. 04/03/19  Yes [provider]  losartan (COZAAR) 50 MG tablet Take 50 mg by mouth daily.   Yes [provider]  lurasidone (LATUDA) 40 MG TABS tablet Take 40 mg by mouth daily.   Yes [provider]  melatonin 3 MG TABS tablet Take 3 mg by mouth at bedtime.   Yes [provider]  metFORMIN (GLUCOPHAGE) 500 MG tablet Take 1,000 mg by mouth 2 (two) times daily with a meal.   Yes [provider]  metoprolol tartrate (LOPRESSOR) 50 MG tablet Take 75 mg by mouth 2 (two) times daily.   Yes [provider]  Multiple Vitamin (MULTIVITAMIN) tablet Take 1 tablet by mouth daily.   Yes [provider]  omeprazole (PRILOSEC) 20 MG capsule Take 20 mg by mouth 2 (two) times daily as needed (heartburn).   Yes [provider]  rivaroxaban (XARELTO) 20 MG TABS tablet Take 20 mg by mouth daily.   Yes [provider]  rosuvastatin (CRESTOR) 40 MG tablet Take 40 mg by mouth at bedtime.   Yes [provider]  Semaglutide (OZEMPIC, 1 MG/DOSE, [place]) Inject 1 mg into the skin once a week. Mondays   Yes [provider]  sertraline (ZOLOFT) 100 MG tablet Take 100 mg by mouth daily.   Yes [provider]  Testosterone 1.62 % GEL Place 4 Pump onto the skin daily. Apply to upper arm or shoulder areas only   Yes [provider]    Physical  Exam: Vitals:   04/20/21 2230 04/20/21 2300 04/20/21 2330 04/21/21 0000  BP: 132/88 140/80 (!) 120/52 (!) 145/95  Pulse: (!) 112 (!) 108 96 (!) 108  Resp: (!) 21  (!) 31 15  Temp:      TempSrc:      SpO2: 98% 98% 98% 100%  Weight:      Height:        Constitutional: NAD, calm  Eyes: PERTLA, lids and conjunctivae normal ENMT: Mucous membranes are moist. Posterior pharynx clear of any exudate or lesions.   Neck: supple, no masses  Respiratory:  no wheezing, no crackles. No accessory muscle use.  Cardiovascular: S1 & S2 heard, regular rate and rhythm. No extremity edema.   Abdomen: No distension, no tenderness, soft. Bowel sounds active.  Musculoskeletal: no clubbing / cyanosis. No joint deformity upper and lower extremities.   Skin: no significant rashes, lesions, ulcers. Warm, dry, well-perfused. Neurologic: CN 2-12 grossly intact. Moving all extremities.  Psychiatric: Very pleasant. Cooperative.    Labs and Imaging on Admission: I have personally reviewed following labs and  imaging studies  CBC: Recent Labs  Lab 04/20/21 1532  WBC 8.4  NEUTROABS 4.8  HGB 17.5*  HCT 56.1*  MCV 94.4  PLT 239   Basic Metabolic Panel: Recent Labs  Lab 04/20/21 1532  NA 142  K 4.9  CL 105  CO2 26  GLUCOSE 184*  BUN 19  CREATININE 1.67*  CALCIUM 9.8  MG 2.0   GFR: Estimated Creatinine Clearance: 59.5 mL/min (A) (by C-G formula based on SCr of 1.67 mg/dL (H)). Liver Function Tests: Recent Labs  Lab 04/20/21 1532  AST 22  ALT 20  ALKPHOS 53  BILITOT 1.9*  PROT 8.3*  ALBUMIN 4.0   No results for input(s): LIPASE, AMYLASE in the last 168 hours. No results for input(s): AMMONIA in the last 168 hours. Coagulation Profile: No results for input(s): INR, PROTIME in the last 168 hours. Cardiac Enzymes: No results for input(s): CKTOTAL, CKMB, CKMBINDEX, TROPONINI in the last 168 hours. BNP (last 3 results) No results for input(s): PROBNP in the last 8760 hours. HbA1C: No results for input(s): HGBA1C in the last 72 hours. CBG: No results for input(s): GLUCAP in the last 168 hours. Lipid Profile: No results for input(s): CHOL, HDL, LDLCALC, TRIG, CHOLHDL, LDLDIRECT in the last 72 hours. Thyroid Function Tests: No results for input(s): TSH, T4TOTAL, FREET4, T3FREE, THYROIDAB in the last 72 hours. Anemia Panel: No results for input(s): VITAMINB12, FOLATE, FERRITIN, TIBC, IRON, RETICCTPCT in the last 72 hours. Urine analysis:    Component Value Date/Time   COLORURINE YELLOW 11/10/2020 1253   APPEARANCEUR HAZY (A) 11/10/2020 1253   LABSPEC 1.025 11/10/2020 1253   PHURINE 5.0 11/10/2020 1253   GLUCOSEU >=500 (A) 11/10/2020 1253   HGBUR LARGE (A) 11/10/2020 1253   BILIRUBINUR NEGATIVE 11/10/2020 1253   KETONESUR 20 (A) 11/10/2020 1253   PROTEINUR 100 (A) 11/10/2020 1253   UROBILINOGEN 0.2 04/20/2015 1628   NITRITE NEGATIVE 11/10/2020 1253   LEUKOCYTESUR NEGATIVE 11/10/2020 1253   Sepsis Labs: @LABRCNTIP (procalcitonin:4,lacticidven:4) )No results found  for this or any previous visit (from the past 240 hour(s)).   Radiological Exams on Admission: DG Chest 2 View  Result Date: 04/20/2021 CLINICAL DATA:  Shortness of breath.  Chest pain EXAM: CHEST - 2 VIEW COMPARISON:  Chest x-ray 11/12/2020 FINDINGS: The heart and mediastinal contours are within normal limits. No focal consolidation. No pulmonary edema. No pleural effusion. No pneumothorax. No acute osseous abnormality.  IMPRESSION: No active cardiopulmonary disease. Electronically Signed   By: Tish Frederickson M.D.   On: 04/20/2021 16:17    EKG: Independently reviewed. Sinus tachycardia, rate 109, PR 211.   Assessment/Plan   1. Heart block  - Presents with several weeks of brief episodes of palpitations and SOB without LOC and was suspected to be in high-grade block at [facility] clinic  - Potassium and magnesium are normal in ED, no infectious s/s  - Cardiology at [facility] recommended admitting there, checking echocardiogram, and holding flecainide and metoprolol    2. Atrial fibrillation  - Hx of AF for ~7 yrs, had DCCV in 2021  - Last dose of Xarelto was am of 10/5, use IV heparin for now and hold flecainide and metoprolol while evaluating for possible high-grade heart block    3. Insulin-dependent DM  - A1c was 7.3% in April 2022  - Continue CBG checks and insulin    4. CKD IIIa  - SCr is 1.67 on admission, similar to priors  - Renally-dose medications, monitor    DVT prophylaxis: IV heparin for now, Xarelto pta  Code Status: Full  Level of Care: Level of care: Progressive Family Communication: None present  Disposition Plan:  Patient is from: Home  Anticipated d/c is to: Home  Anticipated d/c date is: Possibly as early as 10/7 or 04/23/21 Patient currently: Pending cardiac monitoring, echocardiogram  Consults called: ED discussed with cardiology   Admission status: Observation     Briscoe Deutscher, MD [facility]  04/21/2021, 3:45 AM

## 2021-04-22 ENCOUNTER — Observation Stay (HOSPITAL_BASED_OUTPATIENT_CLINIC_OR_DEPARTMENT_OTHER): Payer: No Typology Code available for payment source

## 2021-04-22 DIAGNOSIS — R9431 Abnormal electrocardiogram [ECG] [EKG]: Secondary | ICD-10-CM

## 2021-04-22 DIAGNOSIS — I455 Other specified heart block: Secondary | ICD-10-CM | POA: Diagnosis not present

## 2021-04-22 DIAGNOSIS — I459 Conduction disorder, unspecified: Secondary | ICD-10-CM | POA: Diagnosis not present

## 2021-04-22 LAB — BASIC METABOLIC PANEL
Anion gap: 10 (ref 5–15)
BUN: 24 mg/dL — ABNORMAL HIGH (ref 8–23)
CO2: 22 mmol/L (ref 22–32)
Calcium: 9.1 mg/dL (ref 8.9–10.3)
Chloride: 103 mmol/L (ref 98–111)
Creatinine, Ser: 1.55 mg/dL — ABNORMAL HIGH (ref 0.61–1.24)
GFR, Estimated: 50 mL/min — ABNORMAL LOW (ref >60)
Glucose, Bld: 103 mg/dL — ABNORMAL HIGH (ref 70–99)
Potassium: 4.2 mmol/L (ref 3.5–5.1)
Sodium: 135 mmol/L (ref 135–145)

## 2021-04-22 LAB — GLUCOSE, CAPILLARY
Glucose-Capillary: 116 mg/dL — ABNORMAL HIGH (ref 70–99)
Glucose-Capillary: 117 mg/dL — ABNORMAL HIGH (ref 70–99)
Glucose-Capillary: 279 mg/dL — ABNORMAL HIGH (ref 70–99)
Glucose-Capillary: 95 mg/dL (ref 70–99)

## 2021-04-22 LAB — CBC
HCT: 53.5 % — ABNORMAL HIGH (ref 39.0–52.0)
Hemoglobin: 17.1 g/dL — ABNORMAL HIGH (ref 13.0–17.0)
MCH: 29.7 pg (ref 26.0–34.0)
MCHC: 32 g/dL (ref 30.0–36.0)
MCV: 92.9 fL (ref 80.0–100.0)
Platelets: 226 10*3/uL (ref 150–400)
RBC: 5.76 MIL/uL (ref 4.22–5.81)
RDW: 14.5 % (ref 11.5–15.5)
WBC: 8.7 10*3/uL (ref 4.0–10.5)
nRBC: 0 % (ref 0.0–0.2)

## 2021-04-22 LAB — ECHOCARDIOGRAM COMPLETE
AR max vel: 2.37 cm2
AV Area VTI: 2.63 cm2
AV Area mean vel: 2.37 cm2
AV Mean grad: 1 mmHg
AV Peak grad: 2.6 mmHg
Ao pk vel: 0.81 m/s
Area-P 1/2: 6.67 cm2
Calc EF: 63.2 %
Height: 73 in
S' Lateral: 3.5 cm
Single Plane A2C EF: 54.6 %
Single Plane A4C EF: 70.1 %
Weight: 4080 oz

## 2021-04-22 LAB — MAGNESIUM: Magnesium: 2.1 mg/dL (ref 1.7–2.4)

## 2021-04-22 MED ORDER — METOPROLOL TARTRATE 100 MG PO TABS: 100.0000 mg | ORAL_TABLET | Freq: Two times a day (BID) | ORAL | 0 refills | Status: DC

## 2021-04-22 MED ORDER — ORAL CARE MOUTH RINSE: 15.0000 mL | Freq: Two times a day (BID) | OROMUCOSAL | Status: DC

## 2021-04-22 MED ORDER — CHLORHEXIDINE GLUCONATE 0.12 % MT SOLN
15.0000 mL | Freq: Two times a day (BID) | OROMUCOSAL | Status: DC
  Administered 2021-04-22 (×2): 15 mL via OROMUCOSAL
  Filled 2021-04-22 (×2): qty 15

## 2021-04-22 MED ORDER — SODIUM CHLORIDE 0.9 % IV SOLN: INTRAVENOUS | Status: DC

## 2021-04-22 NOTE — Progress Notes (Signed)
Progress Note  Patient Name: Leroy Chen Date of Encounter: 04/22/2021  [facility] Cardiologist: Maisie Fus, MD   Subjective   Mr. Kosta is doing well this morning. He has no concerns , no new symptoms overnight.  Inpatient Medications    Scheduled Meds:  chlorhexidine  15 mL Mouth Rinse BID   flecainide  50 mg Oral Q12H   insulin aspart  0-9 Units Subcutaneous Q4H   insulin glargine-yfgn  27 Units Subcutaneous QHS   losartan  50 mg Oral Daily   mouth rinse  15 mL Mouth Rinse q12n4p   melatonin  3 mg Oral QHS   metoprolol tartrate  100 mg Oral BID   rivaroxaban  20 mg Oral Q supper   rosuvastatin  40 mg Oral Daily   Continuous Infusions:  sodium chloride 75 mL/hr at 04/22/21 0950   PRN Meds: acetaminophen **OR** acetaminophen, oxyCODONE, senna-docusate   Vital Signs    Vitals:   04/21/21 2113 04/22/21 0043 04/22/21 0526 04/22/21 0823  BP: 128/83 111/73 (!) 125/92 136/89  Pulse: 100 98 80 (!) 102  Resp: 20 18 20    Temp: 98.5 F (36.9 C) 97.8 F (36.6 C) 97.6 F (36.4 C)   TempSrc: Oral Oral Oral   SpO2: 95% 97% 98%   Weight:      Height:        Intake/Output Summary (Last 24 hours) at 04/22/2021 1241 Last data filed at 04/22/2021 1218 Gross per 24 hour  Intake 424.1 ml  Output --  Net 424.1 ml   Last 3 Weights 04/20/2021 11/13/2020 04/27/2015  Weight (lbs) 255 lb 248 lb 9.6 oz 251 lb  Weight (kg) 115.667 kg 112.764 kg 113.853 kg      Telemetry    Sinus rhythm with intermittent Wenckebach- Personally Reviewed  ECG  No new  Physical Exam  GEN: No acute distress.   Neck: No JVD Cardiac: RRR, no murmurs, rubs, or gallops.  Respiratory: Clear to auscultation bilaterally. GI: Soft, nontender, non-distended  MS: No edema; No deformity. Neuro:  Nonfocal  Psych: Normal affect   Labs    High Sensitivity Troponin:   Recent Labs  Lab 04/20/21 1532 04/21/21 0000  TROPONINIHS 6 8     Chemistry Recent Labs  Lab 04/20/21 1532  04/21/21 0449 04/22/21 0447  NA 142 136 135  K 4.9 3.9 4.2  CL 105 106 103  CO2 26 23 22   GLUCOSE 184* 104* 103*  BUN 19 20 24*  CREATININE 1.67* 1.33* 1.55*  CALCIUM 9.8 9.1 9.1  MG 2.0 2.0 2.1  PROT 8.3*  --   --   ALBUMIN 4.0  --   --   AST 22  --   --   ALT 20  --   --   ALKPHOS 53  --   --   BILITOT 1.9*  --   --   GFRNONAA 45* 60* 50*  ANIONGAP 11 7 10     Lipids No results for input(s): CHOL, TRIG, HDL, LABVLDL, LDLCALC, CHOLHDL in the last 168 hours.  Hematology Recent Labs  Lab 04/20/21 1532 04/21/21 0449 04/22/21 0447  WBC 8.4 8.5 8.7  RBC 5.94* 5.73 5.76  HGB 17.5* 16.9 17.1*  HCT 56.1* 53.2* 53.5*  MCV 94.4 92.8 92.9  MCH 29.5 29.5 29.7  MCHC 31.2 31.8 32.0  RDW 14.5 14.4 14.5  PLT 239 210 226   Thyroid  Recent Labs  Lab 04/21/21 0449  TSH 2.536    BNP Recent  Labs  Lab 04/21/21 0000  BNP 20.6    DDimer No results for input(s): DDIMER in the last 168 hours.   Radiology    DG Chest 2 View  Result Date: 04/20/2021 CLINICAL DATA:  Shortness of breath.  Chest pain EXAM: CHEST - 2 VIEW COMPARISON:  Chest x-ray 11/12/2020 FINDINGS: The heart and mediastinal contours are within normal limits. No focal consolidation. No pulmonary edema. No pleural effusion. No pneumothorax. No acute osseous abnormality. IMPRESSION: No active cardiopulmonary disease. Electronically Signed   By: Tish Frederickson M.D.   On: 04/20/2021 16:17   ECHOCARDIOGRAM COMPLETE  Result Date: 04/22/2021    ECHOCARDIOGRAM REPORT   Patient Name:   Leroy Chen Date of Exam: 04/22/2021 Medical Rec #:  [id number]        Height:       73.0 in Accession #:    [id number]       Weight:       255.0 lb Date of Birth:  1956/04/21         BSA:          2.386 m Patient Age:    65 years         BP:           136/89 mmHg Patient Gender: M                HR:           102 bpm. Exam Location:  Inpatient Procedure: 2D Echo, Cardiac Doppler and Color Doppler Indications:    Abnormal EKG  History:         Patient has prior history of Echocardiogram examinations, most                 recent 10/29/2014. Arrythmias:Atrial Fibrillation; Risk                 Factors:Diabetes, Hypertension, Dyslipidemia, Former Smoker and                 Obesity.  Sonographer:    Gertie Fey RDMS, RVT, RDCS Referring Phys: [id number] TIMOTHY S OPYD  Sonographer Comments: Technically challenging study due to limited acoustic windows and patient is morbidly obese. Image acquisition challenging due to respiratory motion. IMPRESSIONS  1. Left ventricular ejection fraction, by estimation, is 65 to 70%. The left ventricle has normal function. The left ventricle has no regional wall motion abnormalities. Left ventricular diastolic parameters were normal.  2. Right ventricular systolic function is normal. The right ventricular size is normal.  3. The mitral valve is normal in structure. No evidence of mitral valve regurgitation. No evidence of mitral stenosis.  4. The aortic valve is calcified. Aortic valve regurgitation is not visualized. Mild aortic valve sclerosis is present, with no evidence of aortic valve stenosis.  5. The inferior vena cava is normal in size with greater than 50% respiratory variability, suggesting right atrial pressure of 3 mmHg. FINDINGS  Left Ventricle: Left ventricular ejection fraction, by estimation, is 65 to 70%. The left ventricle has normal function. The left ventricle has no regional wall motion abnormalities. The left ventricular internal cavity size was normal in size. There is  no left ventricular hypertrophy. Left ventricular diastolic parameters were normal. Right Ventricle: The right ventricular size is normal. No increase in right ventricular wall thickness. Right ventricular systolic function is normal. Left Atrium: Left atrial size was normal in size. Right Atrium: Right atrial size was normal in size. Pericardium: There is no evidence of  pericardial effusion. Mitral Valve: The mitral valve is normal  in structure. No evidence of mitral valve regurgitation. No evidence of mitral valve stenosis. Tricuspid Valve: The tricuspid valve is normal in structure. Tricuspid valve regurgitation is mild . No evidence of tricuspid stenosis. Aortic Valve: The aortic valve is calcified. Aortic valve regurgitation is not visualized. Mild aortic valve sclerosis is present, with no evidence of aortic valve stenosis. Aortic valve mean gradient measures 1.0 mmHg. Aortic valve peak gradient measures 2.6 mmHg. Aortic valve area, by VTI measures 2.63 cm. Pulmonic Valve: The pulmonic valve was normal in structure. Pulmonic valve regurgitation is not visualized. No evidence of pulmonic stenosis. Aorta: The aortic root is normal in size and structure. Venous: The inferior vena cava is normal in size with greater than 50% respiratory variability, suggesting right atrial pressure of 3 mmHg. IAS/Shunts: No atrial level shunt detected by color flow Doppler.  LEFT VENTRICLE PLAX 2D LVIDd:         4.20 cm     Diastology LVIDs:         3.50 cm     LV e' medial:    16.05 cm/s LV PW:         0.60 cm     LV E/e' medial:  5.0 LV IVS:        1.00 cm     LV e' lateral:   12.80 cm/s LVOT diam:     2.00 cm     LV E/e' lateral: 6.3 LV SV:         34 LV SV Index:   14 LVOT Area:     3.14 cm  LV Volumes (MOD) LV vol d, MOD A2C: 37.9 ml LV vol d, MOD A4C: 57.1 ml LV vol s, MOD A2C: 17.2 ml LV vol s, MOD A4C: 17.1 ml LV SV MOD A2C:     20.7 ml LV SV MOD A4C:     57.1 ml LV SV MOD BP:      30.4 ml LEFT ATRIUM             Index        RIGHT ATRIUM           Index LA diam:        3.00 cm 1.26 cm/m   RA Area:     16.00 cm LA Vol (A2C):   52.4 ml 21.96 ml/m  RA Volume:   41.10 ml  17.23 ml/m LA Vol (A4C):   45.4 ml 19.03 ml/m LA Biplane Vol: 49.7 ml 20.83 ml/m  AORTIC VALVE AV Area (Vmax):    2.37 cm AV Area (Vmean):   2.37 cm AV Area (VTI):     2.63 cm AV Vmax:           81.30 cm/s AV Vmean:          57.600 cm/s AV VTI:            0.128 m AV Peak  Grad:      2.6 mmHg AV Mean Grad:      1.0 mmHg LVOT Vmax:         61.40 cm/s LVOT Vmean:        43.500 cm/s LVOT VTI:          0.107 m LVOT/AV VTI ratio: 0.84  AORTA Ao Root diam: 3.40 cm MITRAL VALVE MV Area (PHT): 6.67 cm    SHUNTS MV Decel Time: 114 msec    Systemic VTI:  0.11 m MV  E velocity: 80.27 cm/s  Systemic Diam: 2.00 cm MV A velocity: 38.27 cm/s MV E/A ratio:  2.10 Donato Schultz MD Electronically signed by Donato Schultz MD Signature Date/Time: 04/22/2021/10:53:26 AM    Final     Cardiac Studies   TTE 04/22/2021 Normal LV function, no RWMA. No LVH Normal RV fxn Normal Left atrium. LA vol index 20 ml/m2 No significant valve dx No sig. pulmonary htn Normal IVC  Patient Profile     DARELD MCAULIFFE is a 65 y.o. male with a hx of PAF on xarelto, DM, OSA on CPAP, HTN, HLD, obesity, former smoking, and ED who is being seen 04/21/2021 for the evaluation irregular rhythm  Assessment & Plan   #Wenckebach- Mr Tamblyn continue to have this rhythm. This does not require a pacemaker. He can continue the flecainide and metoprolol. His echo was normal.   #Atrial Tachycardia- sinus rhythm. AT, an ectopic rhythm, seems to be responding to flecainide and metoprolol. Can continue per above.   [facility] will sign off.   Medication Recommendations:  Continue flecainide 50 mg BID and metoprolol 100 tartrate BID.  Other recommendations (labs, testing, etc):  Can order a ziopatch for 14 days. It will be sent to me to read.  Follow up as an outpatient:  [facility] upon [facility]. We discussed this  For questions or updates, please contact [facility] Please consult [email or web address] for contact info under        Signed, Maisie Fus, MD  04/22/2021, 12:41 PM

## 2021-04-22 NOTE — Progress Notes (Signed)
2D echocardiogram completed. Refer to "CV Proc" under chart review to view preliminary results.  04/22/2021 9:08 AM Eula Fried., MHA, RVT, RDCS, RDMS

## 2021-04-22 NOTE — Plan of Care (Signed)
  Problem: Health Behavior/Discharge Planning: Goal: Ability to manage health-related needs will improve Outcome: Progressing   Problem: Clinical Measurements: Goal: Cardiovascular complication will be avoided Outcome: Progressing   

## 2021-04-22 NOTE — Progress Notes (Signed)
AVS given to patient and explained at the bedside. Medications and follow up appointments have been explained with pt verbalizing understanding.  

## 2021-04-22 NOTE — Plan of Care (Signed)
  Problem: Education: Goal: Knowledge of General Education information will improve Description: Including pain rating scale, medication(s)/side effects and non-pharmacologic comfort measures Outcome: Progressing   Problem: Clinical Measurements: Goal: Cardiovascular complication will be avoided Outcome: Progressing   Problem: Safety: Goal: Ability to remain free from injury will improve Outcome: Progressing   Problem: Nutrition: Goal: Adequate nutrition will be maintained Outcome: Completed/Met   Problem: Coping: Goal: Level of anxiety will decrease Outcome: Completed/Met

## 2021-04-22 NOTE — Discharge Summary (Signed)
Physician Discharge Summary  Leroy Chen:[id number] DOB: 1956-04-28 DOA: 04/20/2021  PCP: [name]  Admit date: 04/20/2021 Discharge date: 04/22/2021  Admitted From: Home Discharge disposition: Home   Code Status: Full Code   Discharge Diagnosis:   Principal Problem:   Heart block Active Problems:   Insulin-requiring or dependent type II diabetes mellitus (HCC)   Unspecified atrial fibrillation (HCC)   CKD (chronic kidney disease), stage III (HCC)   Coagulopathy [facility])     Chief Complaint  Patient presents with   Chest Pain    Brief narrative: Leroy Chen is a 65 y.o. male with PMH significant for DM2, HTN, HLD, former smoking, obesity, atrial fibrillation on Xarelto, hidradenitis suppurativa.  Patient presented to the ED on 10/5 for evaluation of palpitation and a concern for heart block at outpatient clinic.  Patient reports several weeks history of palpitations that last 1 to 2 minutes occurring at various settings at rest and exertion.  Also described associated shortness of breath, no loss of consciousness.  He was unable to get an appointment with his cardiologist at [facility] and hence went to the walk-in clinic.  EKG raised concern of intermittent high-grade heart block and hence sent to the ED for further evaluation and management.  In the ED, patient was afebrile, hemodynamically stable EKG showed irregular rhythm with rate in the 110s and prolonged PR interval.   Chest x-ray was negative for acute cardiopulmonary disease.   Labs showed creatinine elevated 1.67,  Troponin and BNP normal   Per on-call cardiology recommendation, patient was observed overnight.  Metoprolol and flecainide were held. Admitted to hospitalist service Seen by cardiology this morning See below for details  Subjective: Patient was seen and examined this morning.  Pleasant elderly African-American male.  Sitting up in bed.  Not in distress.  No new symptoms.   Echocardiogram done this morning  Hospital course: First-degree AV block Intermittent second-degree AV block Atrial tachycardia -Presented with symptoms of shortness of breath, chest tightness, lightheadedness without syncope -Cardiology consult appreciated.  Likely has atrial tachycardia.  Flecainide was resumed.  Metoprolol tartrate dose was increased to 100 mg twice daily.  Per cardiology, pacemaker placement is not indicated at this time.  A Zio patch was placed.  Echocardiogram this morning showed EF of 65 to 70% with no regional wall motion abnormality, normal diastolic parameters, right ventricular size and function normal.    Paroxysmal A. fib -Hx of AF for ~7 yrs, had DCCV in 2021  -Chronically on Xarelto.  No procedure planned per cardiology.  Xarelto was resumed.  Type 2 diabetes mellitus -A1c 7.2 on 10/6 -Home meds include Ozempic weekly, Lantus 55 units nightly, Jardiance 12.5 mg daily, metformin 1000 mg twice daily. -Post discharge I will resume Ozempic Lantus and Jardiance but I would keep metformin on hold because of elevated creatinine.  Patient understands and agrees to the plan. Recent Labs  Lab 04/21/21 2046 04/22/21 0039 04/22/21 0447 04/22/21 0756 04/22/21 1157  GLUCAP 203* 95 117* 116* 279*   Hyperlipidemia -Crestor to continue  CKD IIIa  - SCr is 1.67 on admission, gradually improving, 1.55 today.  IV fluid given.  Continue to follow-up as an outpatient.  Metformin stopped. Recent Labs    11/10/20 1212 11/12/20 1719 11/13/20 0400 04/20/21 1532 04/21/21 0449 04/22/21 0447  BUN 31* 34* 36* 19 20 24*  CREATININE 1.84* 1.69* 1.54* 1.67* 1.33* 1.55*   Hidradenitis suppurativa -Weekly adalimumab, clindamycin cream, clobetasol cream,  Depression -Latuda, sertraline  Allergies as of 04/22/2021       Reactions   Penicillins Swelling        Medication List     STOP taking these medications    ibuprofen 200 MG tablet Commonly known as:  ADVIL   metFORMIN 500 MG tablet Commonly known as: GLUCOPHAGE       TAKE these medications    acetaminophen 500 MG tablet Commonly known as: TYLENOL Take 1,000 mg by mouth 2 (two) times daily.   Adalimumab 40 MG/0.4ML Pnkt Inject 40 mg into the skin once a week. Mondays   albuterol 108 (90 Base) MCG/ACT inhaler Commonly known as: VENTOLIN HFA Inhale 1-2 puffs into the lungs every 6 (six) hours as needed for wheezing or shortness of breath.   Chlorhexidine Gluconate 4 % Soln Apply 1 application topically See admin instructions. Apply to armpits & Groin 3 times a week in the shower. Leave on for 1 minute and then rinse   clindamycin 1 % lotion Commonly known as: CLEOCIN T Apply 1 application topically 2 (two) times daily. Draining bumps   clobetasol 0.05 % external solution Commonly known as: TEMOVATE Apply 1 application topically 2 (two) times daily as needed (infection on scalp).   dicyclomine 10 MG capsule Commonly known as: BENTYL Take 10 mg by mouth 3 (three) times daily as needed for spasms.   empagliflozin 25 MG Tabs tablet Commonly known as: JARDIANCE Take 12.5 mg by mouth daily.   Ensure Take 237 mLs by mouth daily.   ergocalciferol 1.25 MG (50000 UT) capsule Commonly known as: VITAMIN D2 Take 50,000 Units by mouth once a week. Mondays   flecainide 50 MG tablet Commonly known as: TAMBOCOR Take 50 mg by mouth 2 (two) times daily.   Fluocinolone Acetonide 0.01 % Oil Place 1 application in ear(s) 2 (two) times daily as needed (scaling).   HYDROcodone-acetaminophen 5-325 MG tablet Commonly known as: NORCO/VICODIN Take 1 tablet by mouth every 6 (six) hours as needed for severe pain.   insulin glargine 100 UNIT/ML Solostar Pen Commonly known as: LANTUS Inject 55 Units into the skin every evening.   losartan 50 MG tablet Commonly known as: COZAAR Take 50 mg by mouth daily.   lurasidone 40 MG Tabs tablet Commonly known as: LATUDA Take 40 mg by mouth  daily.   melatonin 3 MG Tabs tablet Take 3 mg by mouth at bedtime.   metoprolol tartrate 100 MG tablet Commonly known as: LOPRESSOR Take 1 tablet (100 mg total) by mouth 2 (two) times daily. What changed:  medication strength how much to take   multivitamin tablet Take 1 tablet by mouth daily.   omeprazole 20 MG capsule Commonly known as: PRILOSEC Take 20 mg by mouth 2 (two) times daily as needed (heartburn).   OZEMPIC (1 MG/DOSE) [place] Inject 1 mg into the skin once a week. Mondays   rivaroxaban 20 MG Tabs tablet Commonly known as: XARELTO Take 20 mg by mouth daily.   rosuvastatin 40 MG tablet Commonly known as: CRESTOR Take 40 mg by mouth at bedtime.   sertraline 100 MG tablet Commonly known as: ZOLOFT Take 100 mg by mouth daily.   Testosterone 1.62 % Gel Place 4 Pump onto the skin daily. Apply to upper arm or shoulder areas only        Discharge Instructions:  Diet Recommendation: Cardiac/diabetic diet   @BRDDSCINSTRUCTIONS @  Follow ups:    Follow-up Information     [facility]   Contact information: Kathryne Sharper  [facility] [place] [address] [phone]         Maisie Fus, MD .   Specialty: Internal Medicine Contact information: [address] [place] [address] [phone]                 Wound care:   Incision (Closed) 01/01/15 Rectum Other (Comment) (Active)  Date First Assessed/Time First Assessed: 01/01/15 0956   Location: Rectum  Location Orientation: Other (Comment)    Assessments 01/01/2015 10:15 AM 01/01/2015 11:00 AM  Dressing Type Abdominal pads;Mesh briefs Abdominal pads;Mesh briefs  Dressing Clean;Dry;Intact Dry;Intact  Drainage Amount None Scant  Drainage Description -- Serosanguineous     No Linked orders to display     Incision (Closed) 04/27/15 Abdomen Other (Comment) (Active)  Date First Assessed/Time First Assessed: 04/27/15 1123   Location: Abdomen   Location Orientation: Other (Comment)    Assessments 04/27/2015 11:30 AM 05/03/2015  8:11 AM  Dressing Type Gauze (Comment);Tape dressing Abdominal pads  Dressing Clean;Dry;Intact Clean;Dry;Intact  Site / Wound Assessment -- Dressing in place / Unable to assess  Drainage Amount None None     No Linked orders to display     Incision (Closed) 04/27/15 Abdomen Left (Active)  Date First Assessed/Time First Assessed: 04/27/15 1123   Location: Abdomen  Location Orientation: Left    Assessments 04/27/2015 11:30 AM 05/03/2015  8:11 AM  Dressing Type Honeycomb;Transparent dressing Abdominal pads;Gauze (Comment)  Dressing Clean;Dry;Intact Clean;Dry;Intact  Dressing Change Frequency -- Daily  Drainage Amount None None     No Linked orders to display     Wound / Incision (Open or Dehisced) 11/13/20 Incision - Dehisced Penis Medial;Mid steri strips over incision (Active)  Date First Assessed/Time First Assessed: 11/13/20 0605   Wound Type: Incision - Dehisced  Location: (c) Penis  Location Orientation: Medial;Mid  Wound Description (Comments): steri strips over incision  Present on Admission: Yes    Assessments 11/13/2020  6:05 AM 11/13/2020  9:45 AM  Dressing Type Other (Comment) Adhesive strips  Dressing Status Old drainage;Intact Old drainage;Intact  Site / Wound Assessment Dressing in place / Unable to assess --  Closure Other (Comment) --  Drainage Amount Other (Comment) --  Drainage Description Other (Comment) --  Treatment Other (Comment) --     No Linked orders to display     Wound / Incision (Open or Dehisced) 11/13/20 (MASD) Moisture Associated Skin Damage Abdomen Medial;Mid;Lower;Anterior Under abdominal folds (Active)  Date First Assessed/Time First Assessed: 11/13/20 0605   Wound Type: (MASD) Moisture Associated Skin Damage  Location: Abdomen  Location Orientation: Medial;Mid;Lower;Anterior  Wound Description (Comments): Under abdominal folds  Present on Admission:...     Assessments 11/13/2020  6:05 AM  Dressing Type None  Dressing Status None  Site / Wound Assessment Pink;Other (Comment)  Drainage Amount None  Treatment Other (Comment)     No Linked orders to display    Discharge Exam:   Vitals:   04/21/21 2113 04/22/21 0043 04/22/21 0526 04/22/21 0823  BP: 128/83 111/73 (!) 125/92 136/89  Pulse: 100 98 80 (!) 102  Resp: 20 18 20    Temp: 98.5 F (36.9 C) 97.8 F (36.6 C) 97.6 F (36.4 C)   TempSrc: Oral Oral Oral   SpO2: 95% 97% 98%   Weight:      Height:        Body mass index is 33.64 kg/m.  General exam: Pleasant elderly African-American male.  Not in distress Skin: No rashes, lesions or ulcers. HEENT: Atraumatic,  normocephalic, no obvious bleeding Lungs: Clear to auscultation bilaterally CVS: Regular rate and rhythm, no murmur GI/Abd soft, nontender, nondistended, bowel sound present CNS: Alert, awake, oriented x3 Psychiatry: Mood appropriate Extremities: No pedal edema, no calf tenderness  Time coordinating discharge: 35 minutes   The results of significant diagnostics from this hospitalization (including imaging, microbiology, ancillary and laboratory) are listed below for reference.    Procedures and Diagnostic Studies:   DG Chest 2 View  Result Date: 04/20/2021 CLINICAL DATA:  Shortness of breath.  Chest pain EXAM: CHEST - 2 VIEW COMPARISON:  Chest x-ray 11/12/2020 FINDINGS: The heart and mediastinal contours are within normal limits. No focal consolidation. No pulmonary edema. No pleural effusion. No pneumothorax. No acute osseous abnormality. IMPRESSION: No active cardiopulmonary disease. Electronically Signed   By: Tish Frederickson M.D.   On: 04/20/2021 16:17     Labs:   Basic Metabolic Panel: Recent Labs  Lab 04/20/21 1532 04/21/21 0449 04/22/21 0447  NA 142 136 135  K 4.9 3.9 4.2  CL 105 106 103  CO2 GLUCOSE 184* 104* 103*  BUN 19 20 24*  CREATININE 1.67* 1.33* 1.55*  CALCIUM 9.8 9.1 9.1  MG  2.0 2.0 2.1   GFR Estimated Creatinine Clearance: 64.2 mL/min (A) (by C-G formula based on SCr of 1.55 mg/dL (H)). Liver Function Tests: Recent Labs  Lab 04/20/21 1532  AST 22  ALT 20  ALKPHOS 53  BILITOT 1.9*  PROT 8.3*  ALBUMIN 4.0   No results for input(s): LIPASE, AMYLASE in the last 168 hours. No results for input(s): AMMONIA in the last 168 hours. Coagulation profile No results for input(s): INR, PROTIME in the last 168 hours.  CBC: Recent Labs  Lab 04/20/21 1532 04/21/21 0449 04/22/21 0447  WBC 8.4 8.5 8.7  NEUTROABS 4.8  --   --   HGB 17.5* 16.9 17.1*  HCT 56.1* 53.2* 53.5*  MCV 94.4 92.8 92.9  PLT 239 210 226   Cardiac Enzymes: No results for input(s): CKTOTAL, CKMB, CKMBINDEX, TROPONINI in the last 168 hours. BNP: Invalid input(s): POCBNP CBG: Recent Labs  Lab 04/21/21 2046 04/22/21 0039 04/22/21 0447 04/22/21 0756 04/22/21 1157  GLUCAP 203* 95 117* 116* 279*   D-Dimer No results for input(s): DDIMER in the last 72 hours. Hgb A1c Recent Labs    04/21/21 0449  HGBA1C 7.2*   Lipid Profile No results for input(s): CHOL, HDL, LDLCALC, TRIG, CHOLHDL, LDLDIRECT in the last 72 hours. Thyroid function studies Recent Labs    04/21/21 0449  TSH 2.536   Anemia work up No results for input(s): VITAMINB12, FOLATE, FERRITIN, TIBC, IRON, RETICCTPCT in the last 72 hours. Microbiology Recent Results (from the past 240 hour(s))  Resp Panel by RT-PCR (Flu A&B, Covid) Nasopharyngeal Swab     Status: None   Collection Time: 04/21/21  2:29 AM   Specimen: Nasopharyngeal Swab; Nasopharyngeal(NP) swabs in vial transport medium  Result Value Ref Range Status   SARS Coronavirus 2 by RT PCR NEGATIVE NEGATIVE Final    Comment: (NOTE) SARS-CoV-2 target nucleic acids are NOT DETECTED.  The SARS-CoV-2 RNA is generally detectable in upper respiratory specimens during the acute phase of infection. The lowest concentration of SARS-CoV-2 viral copies this assay can  detect is 138 copies/mL. A negative result does not preclude SARS-Cov-2 infection and should not be used as the sole basis for treatment or other patient management decisions. A negative result may occur with  improper specimen collection/handling, submission of specimen  other than nasopharyngeal swab, presence of viral mutation(s) within the areas targeted by this assay, and inadequate number of viral copies(<138 copies/mL). A negative result must be combined with clinical observations, patient history, and epidemiological information. The expected result is Negative.  Fact Sheet for Patients:  BloggerCourse.com  Fact Sheet for Healthcare Providers:  SeriousBroker.it  This test is no t yet approved or cleared by the [place] FDA and  has been authorized for detection and/or diagnosis of SARS-CoV-2 by FDA under an Emergency Use Authorization (EUA). This EUA will remain  in effect (meaning this test can be used) for the duration of the COVID-19 declaration under Section 564(b)(1) of the Act, 21 U.S.C.section 360bbb-3(b)(1), unless the authorization is terminated  or revoked sooner.       Influenza A by PCR NEGATIVE NEGATIVE Final   Influenza B by PCR NEGATIVE NEGATIVE Final    Comment: (NOTE) The Xpert Xpress SARS-CoV-2/FLU/RSV plus assay is intended as an aid in the diagnosis of influenza from Nasopharyngeal swab specimens and should not be used as a sole basis for treatment. Nasal washings and aspirates are unacceptable for Xpert Xpress SARS-CoV-2/FLU/RSV testing.  Fact Sheet for Patients: BloggerCourse.com  Fact Sheet for Healthcare Providers: SeriousBroker.it  This test is not yet approved or cleared by the [place] FDA and has been authorized for detection and/or diagnosis of SARS-CoV-2 by FDA under an Emergency Use Authorization (EUA). This EUA will remain in  effect (meaning this test can be used) for the duration of the COVID-19 declaration under Section 564(b)(1) of the Act, 21 U.S.C. section 360bbb-3(b)(1), unless the authorization is terminated or revoked.  Performed at [facility], [address] [place], [place] [address]      Signed: Melina Schools Keigo Whalley  [facility] 04/22/2021, 12:10 PM

## 2021-04-24 DIAGNOSIS — I441 Atrioventricular block, second degree: Secondary | ICD-10-CM

## 2021-04-24 DIAGNOSIS — R42 Dizziness and giddiness: Secondary | ICD-10-CM

## 2021-04-24 DIAGNOSIS — I459 Conduction disorder, unspecified: Secondary | ICD-10-CM | POA: Diagnosis not present

## 2021-05-30 ENCOUNTER — Telehealth: Payer: Self-pay

## 2021-05-30 NOTE — Telephone Encounter (Signed)
-   Called and spoke with patient regarding message from Dr. Wyline Mood. Patient states that he has been having the palpitations much longer and persistent now. Patient states he is also short of breath with these palpitations. Patient states that he has been in contact with the [place]- advised him to call them today to discuss. Advised him that results and consult note have both been faxed to the [place]. Patient states that he doesn't feel worse than he did but states that he has the palpitations and shortness of breath more often now and wants to make Dr. Wyline Mood aware. Patient states that next month he will enroll in Medicare and then would like to come here to see Dr. Wyline Mood. Patient states he will cardiology at the [facility] today to discuss, and Made patient aware of ED precautions should new or worsening symptoms develop. Patient verbalized understanding.    Will forward to MD to make aware.   Cardiology Notes and Zio results have been faxed to the Attn of Dr. Carleene Cooper at the [facility] in [place] via [employer].    Per Dr. Wyline Mood: Timmie Foerster,    I saw Mr. Krysiak in the ED. He follows with the [place]. Can you let him know that his zio patch showed fast heart rates from the top chambers of the heart. This is not dangerous. I will let [facility] in [place] know. He is maxed out on metoprolol , so can consider talking to the [place] about a procedure.    Can you fax his ziopatch results and the cardiology consult note and my progress note to the [place] in [place]. Attention cardiology Dr. Carleene Cooper.    Thank you!

## 2021-05-30 NOTE — Telephone Encounter (Signed)
-----   Message from Maisie Fus, MD sent at 05/19/2021 10:58 AM EDT ----- Timmie Foerster, this is the zio patch I mentioned.

## 2023-02-12 ENCOUNTER — Emergency Department (HOSPITAL_COMMUNITY)
Admission: EM | Admit: 2023-02-12 | Discharge: 2023-02-12 | Disposition: A | Discharge status: Home / Self Care | Payer: No Typology Code available for payment source | Attending: Emergency Medicine | Admitting: Emergency Medicine

## 2023-02-12 ENCOUNTER — Other Ambulatory Visit: Payer: Self-pay

## 2023-02-12 ENCOUNTER — Encounter (HOSPITAL_COMMUNITY): Payer: Self-pay

## 2023-02-12 ENCOUNTER — Emergency Department (HOSPITAL_COMMUNITY): Payer: No Typology Code available for payment source

## 2023-02-12 DIAGNOSIS — R079 Chest pain, unspecified: Secondary | ICD-10-CM

## 2023-02-12 DIAGNOSIS — R0789 Other chest pain: Secondary | ICD-10-CM | POA: Insufficient documentation

## 2023-02-12 DIAGNOSIS — Z7901 Long term (current) use of anticoagulants: Secondary | ICD-10-CM | POA: Insufficient documentation

## 2023-02-12 LAB — BASIC METABOLIC PANEL
Anion gap: 11 (ref 5–15)
BUN: 23 mg/dL (ref 8–23)
CO2: 20 mmol/L — ABNORMAL LOW (ref 22–32)
Calcium: 9.5 mg/dL (ref 8.9–10.3)
Chloride: 106 mmol/L (ref 98–111)
Creatinine, Ser: 1.49 mg/dL — ABNORMAL HIGH (ref 0.61–1.24)
GFR, Estimated: 51 mL/min — ABNORMAL LOW (ref >60)
Glucose, Bld: 212 mg/dL — ABNORMAL HIGH (ref 70–99)
Potassium: 4.4 mmol/L (ref 3.5–5.1)
Sodium: 137 mmol/L (ref 135–145)

## 2023-02-12 LAB — CBC
HCT: 52.1 % — ABNORMAL HIGH (ref 39.0–52.0)
Hemoglobin: 16.5 g/dL (ref 13.0–17.0)
MCH: 30.2 pg (ref 26.0–34.0)
MCHC: 31.7 g/dL (ref 30.0–36.0)
MCV: 95.4 fL (ref 80.0–100.0)
Platelets: 165 10*3/uL (ref 150–400)
RBC: 5.46 MIL/uL (ref 4.22–5.81)
RDW: 13.6 % (ref 11.5–15.5)
WBC: 7 10*3/uL (ref 4.0–10.5)
nRBC: 0 % (ref 0.0–0.2)

## 2023-02-12 LAB — D-DIMER, QUANTITATIVE: D-Dimer, Quant: <0.27 ug/mL-FEU (ref 0.00–0.50)

## 2023-02-12 LAB — TROPONIN I (HIGH SENSITIVITY)
Troponin I (High Sensitivity): 8 ng/L (ref <18)
Troponin I (High Sensitivity): 7 ng/L (ref <18)

## 2023-02-12 NOTE — Discharge Instructions (Signed)
Your heart enzymes were reassuring today as well as your D-dimer sure that you are low risk for a blood clot.  I believe that your chest pain is likely secondary to a muscle strain, you can use lidocaine patches, and Tylenol for your pain control.  Return to the ER if you feel like your symptoms are worsening.

## 2023-02-12 NOTE — ED Provider Notes (Signed)
[place] EMERGENCY DEPARTMENT AT [facility] Provider Note   CSN: [id number] Arrival date & time: 02/12/23  1351     History  Chief Complaint  Patient presents with   Chest Pain    Chest pain x1 week    AD GLAVE is a 67 y.o. male, history of diabetes, A-fib, multiple myeloma, who presents to the ED secondary to persistent left-sided chest pain, that does not radiate, has been going on for the last week.  He states it feels dull and achy, on the left side of his breast, is mild pain, and not associated with any shortness of breath, cough, fevers, leg swelling, nausea, or vomiting.  Is not having any abdominal pain.  Pain is constant, worse with motion.  Has been compliant with his Xarelto.    Home Medications Prior to Admission medications   Medication Sig Start Date End Date Taking? Authorizing Provider  acetaminophen (TYLENOL) 500 MG tablet Take 1,000 mg by mouth 2 (two) times daily.    [provider]  Adalimumab 40 MG/0.4ML PNKT Inject 40 mg into the skin once a week. Mondays    [provider]  albuterol (PROVENTIL HFA;VENTOLIN HFA) 108 (90 Base) MCG/ACT inhaler Inhale 1-2 puffs into the lungs every 6 (six) hours as needed for wheezing or shortness of breath. 08/05/17   Wallis Bamberg, PA-C  Chlorhexidine Gluconate 4 % SOLN Apply 1 application topically See admin instructions. Apply to armpits & Groin 3 times a week in the shower. Leave on for 1 minute and then rinse    [provider]  clindamycin (CLEOCIN T) 1 % lotion Apply 1 application topically 2 (two) times daily. Draining bumps 03/14/19   [provider]  clobetasol (TEMOVATE) 0.05 % external solution Apply 1 application topically 2 (two) times daily as needed (infection on scalp).    [provider]  dicyclomine (BENTYL) 10 MG capsule Take 10 mg by mouth 3 (three) times daily as needed for spasms.    [provider]  empagliflozin (JARDIANCE) 25 MG TABS  tablet Take 12.5 mg by mouth daily.    [provider]  Ensure (ENSURE) Take 237 mLs by mouth daily.    [provider]  ergocalciferol (VITAMIN D2) 1.25 MG (50000 UT) capsule Take 50,000 Units by mouth once a week. Mondays    [provider]  flecainide (TAMBOCOR) 50 MG tablet Take 50 mg by mouth 2 (two) times daily.    [provider]  Fluocinolone Acetonide 0.01 % OIL Place 1 application in ear(s) 2 (two) times daily as needed (scaling).    [provider]  HYDROcodone-acetaminophen (NORCO/VICODIN) 5-325 MG tablet Take 1 tablet by mouth every 6 (six) hours as needed for severe pain.    [provider]  insulin glargine (LANTUS) 100 UNIT/ML Solostar Pen Inject 55 Units into the skin every evening. 04/03/19   [provider]  losartan (COZAAR) 50 MG tablet Take 50 mg by mouth daily.    [provider]  lurasidone (LATUDA) 40 MG TABS tablet Take 40 mg by mouth daily.    [provider]  melatonin 3 MG TABS tablet Take 3 mg by mouth at bedtime.    [provider]  metoprolol tartrate (LOPRESSOR) 100 MG tablet Take 1 tablet (100 mg total) by mouth 2 (two) times daily. 04/22/21 05/22/21  Lorin Glass, MD  Multiple Vitamin (MULTIVITAMIN) tablet Take 1 tablet by mouth daily.    [provider]  omeprazole (PRILOSEC)  20 MG capsule Take 20 mg by mouth 2 (two) times daily as needed (heartburn).    [provider]  rivaroxaban (XARELTO) 20 MG TABS tablet Take 20 mg by mouth daily.    [provider]  rosuvastatin (CRESTOR) 40 MG tablet Take 40 mg by mouth at bedtime.    [provider]  Semaglutide (OZEMPIC, 1 MG/DOSE, [place]) Inject 1 mg into the skin once a week. Mondays    [provider]  sertraline (ZOLOFT) 100 MG tablet Take 100 mg by mouth daily.    [provider]  Testosterone 1.62 % GEL Place 4 Pump onto the skin daily. Apply to upper arm or shoulder areas only     [provider]      Allergies    Penicillins    Review of Systems   Review of Systems  Respiratory:  Negative for shortness of breath.   Cardiovascular:  Positive for chest pain.    Physical Exam Updated Vital Signs BP (!) 107/92   Pulse 86   Temp 98.6 F (37 C) (Oral)   Resp 18   Ht 6\' 1"  (1.854 m)   Wt 113 kg   SpO2 98%   BMI 32.87 kg/m  Physical Exam Vitals and nursing note reviewed.  Constitutional:      General: He is not in acute distress.    Appearance: He is well-developed.  HENT:     Head: Normocephalic and atraumatic.  Eyes:     Conjunctiva/sclera: Conjunctivae normal.  Cardiovascular:     Rate and Rhythm: Normal rate and regular rhythm.     Heart sounds: No murmur heard. Pulmonary:     Effort: Pulmonary effort is normal. No respiratory distress.     Breath sounds: Normal breath sounds.  Chest:     Comments: Tenderness to palpation of left breast, without any erythema, edema, crepitus or rash. Abdominal:     Palpations: Abdomen is soft.     Tenderness: There is no abdominal tenderness.  Musculoskeletal:        General: No swelling.     Cervical back: Neck supple.  Skin:    General: Skin is warm and dry.     Capillary Refill: Capillary refill takes less than 2 seconds.  Neurological:     Mental Status: He is alert.  Psychiatric:        Mood and Affect: Mood normal.     ED Results / Procedures / Treatments   Labs (all labs ordered are listed, but only abnormal results are displayed) Labs Reviewed  BASIC METABOLIC PANEL - Abnormal; Notable for the following components:      Result Value   CO2 20 (*)    Glucose, Bld 212 (*)    Creatinine, Ser 1.49 (*)    GFR, Estimated 51 (*)    All other components within normal limits  CBC - Abnormal; Notable for the following components:   HCT 52.1 (*)    All other components within normal limits  D-DIMER, QUANTITATIVE  TROPONIN I (HIGH SENSITIVITY)  TROPONIN I (HIGH SENSITIVITY)     EKG EKG Interpretation Date/Time:  Monday February 12 2023 13:55:20 EDT Ventricular Rate:  81 PR Interval:  210 QRS Duration:  87 QT Interval:  366 QTC Calculation: 425 R Axis:   -60  Text Interpretation: Sinus rhythm Inferior infarct, old Abnormal lateral Q waves Anterior infarct, old Confirmed by Gloris Manchester [phone]) on 02/12/2023 3:41:04 PM  Radiology DG Chest 2 View  Result Date: 02/12/2023  CLINICAL DATA:  chest pain EXAM: CHEST - 2 VIEW COMPARISON:  04/20/2021. FINDINGS: Bilateral lung fields are clear. Bilateral costophrenic angles are clear. Normal cardio-mediastinal silhouette. No acute osseous abnormalities. The soft tissues are within normal limits. IMPRESSION: No active cardiopulmonary disease. Electronically Signed   By: Jules Schick M.D.   On: 02/12/2023 15:45    Procedures Procedures    Medications Ordered in ED Medications - No data to display  ED Course/ Medical Decision Making/ A&P             HEART Score: 4                Medical Decision Making Patient is a 67 year old male, here for chest pain, has been going on for the last week, which is tender to palpation.  He denies any shortness of breath, nausea, vomiting.  He is overall well-appearing.  Pain is constant, worse with movements.  He does have tenderness to palpation on exam.  Given his age, history of A-fib, diabetes, we will obtain a D-dimer, as he is PERC positive, as well as troponins.  He is well-appearing, in no distress  Amount and/or Complexity of Data Reviewed Labs: ordered.    Details: Troponin x 2 within normal limits, D-dimer less than 0.27 Radiology: ordered.    Details: Chest x-ray clear ECG/medicine tests:  Decision-making details documented in ED Course. Discussion of management or test interpretation with external provider(s): Discussed with patient, labs are reassuring, pain is worse on exam upon palpation.  This aligns with a musculoskeletal strain.  His heart score is a 4, will have  him follow-up with his primary care doctor for further evaluation.  His troponins were within normal limits and D-dimer was less than 0.27.  Chest pain is atypical for ACS.  Discussed Tylenol, and lidocaine patches for treatment.   Final Clinical Impression(s) / ED Diagnoses Final diagnoses:  Chest pain, unspecified type    Rx / DC Orders ED Discharge Orders     None         Roslynn Holte, Harley [place], PA 02/12/23 1900    Gloris Manchester, MD 02/12/23 [phone]

## 2023-06-27 ENCOUNTER — Other Ambulatory Visit: Payer: Self-pay

## 2023-06-27 ENCOUNTER — Emergency Department (HOSPITAL_COMMUNITY)
Admission: EM | Admit: 2023-06-27 | Discharge: 2023-06-28 | Disposition: A | Discharge status: Home / Self Care | Payer: Non-veteran care | Attending: Emergency Medicine | Admitting: Emergency Medicine

## 2023-06-27 ENCOUNTER — Emergency Department (HOSPITAL_COMMUNITY): Payer: Non-veteran care

## 2023-06-27 ENCOUNTER — Encounter (HOSPITAL_COMMUNITY): Payer: Self-pay

## 2023-06-27 DIAGNOSIS — E119 Type 2 diabetes mellitus without complications: Secondary | ICD-10-CM | POA: Diagnosis not present

## 2023-06-27 DIAGNOSIS — I6789 Other cerebrovascular disease: Secondary | ICD-10-CM | POA: Diagnosis not present

## 2023-06-27 DIAGNOSIS — Z7901 Long term (current) use of anticoagulants: Secondary | ICD-10-CM | POA: Diagnosis not present

## 2023-06-27 DIAGNOSIS — S86891A Other injury of other muscle(s) and tendon(s) at lower leg level, right leg, initial encounter: Secondary | ICD-10-CM

## 2023-06-27 DIAGNOSIS — W19XXXA Unspecified fall, initial encounter: Secondary | ICD-10-CM | POA: Diagnosis not present

## 2023-06-27 DIAGNOSIS — Z794 Long term (current) use of insulin: Secondary | ICD-10-CM | POA: Insufficient documentation

## 2023-06-27 DIAGNOSIS — S8991XA Unspecified injury of right lower leg, initial encounter: Secondary | ICD-10-CM | POA: Diagnosis present

## 2023-06-27 DIAGNOSIS — S86811A Strain of other muscle(s) and tendon(s) at lower leg level, right leg, initial encounter: Secondary | ICD-10-CM | POA: Diagnosis not present

## 2023-06-27 MED ORDER — HYDROCODONE-ACETAMINOPHEN 5-325 MG PO TABS: 1.0000 | ORAL_TABLET | Freq: Four times a day (QID) | ORAL | 0 refills | Status: DC | PRN

## 2023-06-27 MED ORDER — HYDROCODONE-ACETAMINOPHEN 5-325 MG PO TABS
1.0000 | ORAL_TABLET | Freq: Once | ORAL | Status: AC
  Administered 2023-06-27: 1 via ORAL
  Filled 2023-06-27: qty 1

## 2023-06-27 NOTE — ED Provider Notes (Signed)
EMERGENCY DEPARTMENT AT [facility] Provider Note   CSN: [id number] Arrival date & time: 06/27/23  1758     History  Chief Complaint  Patient presents with   Fall   Leg Pain    Leroy Chen is a 67 y.o. male.  With a history of A-fib on Xarelto, type 2 diabetes presenting to the ED for evaluation of right upper leg pain.  He states he was walking down the steps when his right knee buckled and he fell to the ground with his leg caught under him.  He did not hit his head or lose consciousness.  This incident occurred at 3 PM today.  He is complaining of significant pain to the anterior surface of the right upper leg.  No lower leg pain.  No hip pain.  No numbness, weakness or tingling.  He denies any headaches.   Fall  Leg Pain      Home Medications Prior to Admission medications   Medication Sig Start Date End Date Taking? Authorizing Provider  HYDROcodone-acetaminophen (NORCO/VICODIN) 5-325 MG tablet Take 1 tablet by mouth every 6 (six) hours as needed. 06/27/23  Yes Annalaya Wile, Edsel Petrin, PA-C  acetaminophen (TYLENOL) 500 MG tablet Take 1,000 mg by mouth 2 (two) times daily.    [provider]  Adalimumab 40 MG/0.4ML PNKT Inject 40 mg into the skin once a week. Mondays    [provider]  albuterol (PROVENTIL HFA;VENTOLIN HFA) 108 (90 Base) MCG/ACT inhaler Inhale 1-2 puffs into the lungs every 6 (six) hours as needed for wheezing or shortness of breath. 08/05/17   Wallis Bamberg, PA-C  Chlorhexidine Gluconate 4 % SOLN Apply 1 application topically See admin instructions. Apply to armpits & Groin 3 times a week in the shower. Leave on for 1 minute and then rinse    [provider]  clindamycin (CLEOCIN T) 1 % lotion Apply 1 application topically 2 (two) times daily. Draining bumps 03/14/19   [provider]  clobetasol (TEMOVATE) 0.05 % external solution Apply 1 application topically 2 (two) times daily as needed (infection on  scalp).    [provider]  dicyclomine (BENTYL) 10 MG capsule Take 10 mg by mouth 3 (three) times daily as needed for spasms.    [provider]  empagliflozin (JARDIANCE) 25 MG TABS tablet Take 12.5 mg by mouth daily.    [provider]  Ensure (ENSURE) Take 237 mLs by mouth daily.    [provider]  ergocalciferol (VITAMIN D2) 1.25 MG (50000 UT) capsule Take 50,000 Units by mouth once a week. Mondays    [provider]  flecainide (TAMBOCOR) 50 MG tablet Take 50 mg by mouth 2 (two) times daily.    [provider]  Fluocinolone Acetonide 0.01 % OIL Place 1 application in ear(s) 2 (two) times daily as needed (scaling).    [provider]  HYDROcodone-acetaminophen (NORCO/VICODIN) 5-325 MG tablet Take 1 tablet by mouth every 6 (six) hours as needed for severe pain.    [provider]  insulin glargine (LANTUS) 100 UNIT/ML Solostar Pen Inject 55 Units into the skin every evening. 04/03/19   [provider]  losartan (COZAAR) 50 MG tablet Take 50 mg by mouth daily.    [provider]  lurasidone (LATUDA) 40 MG TABS tablet Take 40 mg by mouth daily.    [provider]  melatonin 3 MG TABS tablet Take 3 mg by mouth at bedtime.    [provider]  metoprolol tartrate (LOPRESSOR) 100 MG tablet Take 1 tablet (100 mg total) by mouth 2 (two) times daily. 04/22/21 05/22/21  Lorin Glass, MD  Multiple Vitamin (MULTIVITAMIN) tablet Take 1 tablet by mouth daily.    [provider]  omeprazole (PRILOSEC) 20 MG capsule Take 20 mg by mouth 2 (two) times daily as needed (heartburn).    [provider]  rivaroxaban (XARELTO) 20 MG TABS tablet Take 20 mg by mouth daily.    [provider]  rosuvastatin (CRESTOR) 40 MG tablet Take 40 mg by mouth at bedtime.    [provider]  Semaglutide (OZEMPIC, 1 MG/DOSE, [place]) Inject 1 mg into the skin once a week. Mondays    [provider]  sertraline (ZOLOFT) 100 MG tablet Take 100 mg by mouth daily.    [provider]  Testosterone 1.62 % GEL Place 4 Pump onto the skin daily. Apply to upper arm or shoulder areas only    [provider]      Allergies    Penicillins    Review of Systems   Review of Systems  Musculoskeletal:  Positive for myalgias.  All other systems reviewed and are negative.   Physical Exam Updated Vital Signs BP (!) 110/54   Pulse 90   Temp 98.5 F (36.9 C) (Oral)   Resp 16   Ht 6\' 1"  (1.854 m)   Wt 113.4 kg   SpO2 96%   BMI 32.98 kg/m  Physical Exam Vitals and nursing note reviewed.  Constitutional:      General: He is not in acute distress.    Appearance: Normal appearance. He is normal weight. He is not ill-appearing.  HENT:     Head: Normocephalic and atraumatic.  Pulmonary:     Effort: Pulmonary effort is normal. No respiratory distress.  Abdominal:     General: Abdomen is flat.  Musculoskeletal:        General: Normal range of motion.     Cervical back: Neck supple.     Comments: Mild soft tissue swelling of the right anterior upper leg with overlying bruising.  Compartments are soft.  No calf TTP.  No knee joint TTP.  PT pulses 2+ bilaterally.  Sensation intact distally.  Skin:    General: Skin is warm and dry.  Neurological:     Mental Status: He is alert and oriented to person, place, and time.  Psychiatric:        Mood and Affect: Mood normal.        Behavior: Behavior normal.     ED Results / Procedures / Treatments   Labs (all labs ordered are listed, but only abnormal results are displayed) Labs Reviewed - No data to display  EKG None  Radiology CT Head Wo Contrast  Result Date: 06/27/2023 CLINICAL DATA:  Fall injury on blood thinners, denies having hit his head. EXAM: CT HEAD WITHOUT CONTRAST TECHNIQUE: Contiguous axial images were obtained from the base of the skull through the vertex without intravenous contrast.  RADIATION DOSE REDUCTION: This exam was performed according to the departmental dose-optimization program which includes automated exposure control, adjustment of the mA and/or kV according to patient size and/or use of iterative reconstruction technique. COMPARISON:  MRI brain without contrast 11/12/2020 FINDINGS: Brain: There is mild atrophy and small-vessel disease. The ventricles are normal in size and position. There is a chronic cortical infarct in the inferior left frontal lobe again noted most likely due to remote trauma. No  new abnormality or suspicious for a cortical based infarct, hemorrhage, mass or mass effect is seen. Basal cisterns are clear. Vascular: There are scattered calcifications of the carotid siphons. No hyperdense central vessel is seen. Skull: Negative for fracture or focal lesions. There is chronic multifocal heterogeneous soft tissue thickening with dystrophic calcifications in the occipital scalp, scattered small areas of heterogeneity in the subcutaneous scalp elsewhere with additional cutaneous calcifications. This was seen as decreased T2 and intermediate T1 signal on the prior MRI and is not notably changed. Findings may reflect residua of remote scalp hematomas. Correlation with physical exam findings is recommended. Sinuses/Orbits: No acute finding. Other: None. IMPRESSION: 1. No acute intracranial CT findings or interval changes. 2. Atrophy and small-vessel disease. 3. 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 4. Chronic multifocal heterogeneous soft tissue thickening with dystrophic calcifications in the occipital scalp, scattered small areas of heterogeneity in the subcutaneous scalp elsewhere with additional cutaneous calcifications. Findings may reflect residua of remote scalp hematomas. Clinical correlation recommended. Electronically Signed   By: Almira Bar M.D.   On: 06/27/2023 21:40   DG FEMUR, MIN 2 VIEWS RIGHT  Result  Date: 06/27/2023 CLINICAL DATA:  Fall, right upper leg pain EXAM: RIGHT FEMUR 2 VIEWS COMPARISON:  None Available. FINDINGS: There is no evidence of fracture or other focal bone lesions. Soft tissues are unremarkable. IMPRESSION: Negative. Electronically Signed   By: Charlett Nose M.D.   On: 06/27/2023 20:21    Procedures Procedures    Medications Ordered in ED Medications  HYDROcodone-acetaminophen (NORCO/VICODIN) 5-325 MG per tablet 1 tablet (1 tablet Oral Given 06/27/23 2058)    ED Course/ Medical Decision Making/ A&P Clinical Course as of 06/27/23 2328  Wed Jun 27, 2023  2242 This is a 67 year old gentleman presenting to ED with complaint of a fall and right leg injury.  Patient reports he hyperflexed his right leg underneath him, bent at the knee when he fell.  He had pain and was unable to bear weight afterwards.  On exam the patient has a contusion over the right quadriceps tendon.  He is not able to extend his right lower extremity.  I have a concern for a quadriceps tendon rupture.  X-ray of the femur showed no evident fracture, including obvious fracture of the patella in the distal view.  I suspect this is likely a tendon injury.  Patient is otherwise neurovascularly intact.  Will place him in a knee immobilizer and make him nonweightbearing on crutches, provide him orthopedic follow-up.  He was given pain medicine in the ED.  The patient and his wife are comfortable with this plan.  He is advised not to drive given dysfunction of right lower leg now [MT]    Clinical Course User Index [MT] Trifan, Kermit Balo, MD                                 Medical Decision Making Amount and/or Complexity of Data Reviewed Radiology: ordered.  Risk Prescription drug management.  This patient presents to the ED for concern of fall, right upper leg pain, this involves an extensive number of treatment options, and is a complaint that carries with it a high risk of complications and morbidity.   The differential diagnosis includes fracture, strain, sprain, contusion, dislocation, compartment syndrome  My initial workup includes imaging, pain control  Additional history obtained from: Nursing notes from this visit. Family wife at  bedside provides a portion of the history  I ordered imaging studies including x-ray right femur, CT head I independently visualized and interpreted imaging which showed no acute osseous abnormalities, no acute intracranial abnormalities but potential sequela of previous hematoma of the scalp I agree with the radiologist interpretation  67 year old male presenting for evaluation of a mechanical fall earlier today that caused pain to the right upper leg.  Localized to the anterior portion of the leg.  No neurologic complaints.  Neurovascular status intact.  Imaging negative for acute osseous abnormalities.  On exam, he has swelling and some bruising to the quadriceps muscles of the right leg.  He reports pain with flexion and extension and states he is unable to move his lower leg.  On exam, there does appear to be patella alta with depression in the infrapatellar region.  Overall suspect patellar tendon rupture.  Neurovascular status intact.  Will place patient in knee immobilizer and give crutches.  He was given contact information for orthopedics and encouraged to follow-up.  He was given a short course of Norco and educated on potential side effects.  He was given return precautions.  Stable at discharge.   At this time there does not appear to be any evidence of an acute emergency medical condition and the patient appears stable for discharge with appropriate outpatient follow up. Diagnosis was discussed with patient who verbalizes understanding of care plan and is agreeable to discharge. I have discussed return precautions with patient and wife who verbalizes understanding. Patient encouraged to follow-up with their PCP within 1 week. All questions  answered.  Patient's case discussed with Dr. Renaye Rakers who agrees with plan to discharge with follow-up.   Note: Portions of this report may have been transcribed using voice recognition software. Every effort was made to ensure accuracy; however, inadvertent computerized transcription errors may still be present.        Final Clinical Impression(s) / ED Diagnoses Final diagnoses:  Fall, initial encounter  Avulsion of right patellar tendon, initial encounter    Rx / DC Orders ED Discharge Orders          Ordered    HYDROcodone-acetaminophen (NORCO/VICODIN) 5-325 MG tablet  Every 6 hours PRN        06/27/23 2328              Michelle Piper, Cordelia Poche 06/27/23 2328    Terald Sleeper, MD 06/29/23 [phone]

## 2023-06-27 NOTE — ED Triage Notes (Signed)
Fall today at 1500, c/o right upper leg pain since fall, unable to bear weight. Pt states his leg buckles from under him. Pt didn't hit head head, no LOC, pt is on blood thinners.

## 2023-06-27 NOTE — Discharge Instructions (Signed)
You have been seen today for your complaint of fall, right knee pain. You likely have a tear of your patellar tendon. Your discharge medications include Norco. This is an opioid pain medication. You should only take this medication as needed for severe pain. You should not drive, operate heavy machinery or make important decisions while taking this medication. You should use alternative methods for pain relief while taking this medication including stretching, gentle range of motion, and alternating tylenol and ibuprofen. Home care instructions are as follows:  Wear the knee immobilizer at all times until you are able to follow-up with orthopedics Follow up with: Dr. Odis Hollingshead.  He is an Investment banker, operational.  Call to schedule follow-up appointment. Please seek immediate medical care if you develop any of the following symptoms: Your symptoms do not get better in 2 weeks. Your symptoms get worse. At this time there does not appear to be the presence of an emergent medical condition, however there is always the potential for conditions to change. Please read and follow the below instructions.  Do not take your medicine if  develop an itchy rash, swelling in your mouth or lips, or difficulty breathing; call 911 and seek immediate emergency medical attention if this occurs.  You may review your lab tests and imaging results in their entirety on your MyChart account.  Please discuss all results of fully with your primary care provider and other specialist at your follow-up visit.  Note: Portions of this text may have been transcribed using voice recognition software. Every effort was made to ensure accuracy; however, inadvertent computerized transcription errors may still be present.

## 2023-07-02 ENCOUNTER — Inpatient Hospital Stay (HOSPITAL_COMMUNITY)
Admission: EM | Admit: 2023-07-02 | Discharge: 2023-07-16 | DRG: 854 | Disposition: A | Discharge status: Home Health | Payer: No Typology Code available for payment source | Attending: Family Medicine | Admitting: Family Medicine

## 2023-07-02 ENCOUNTER — Encounter (HOSPITAL_COMMUNITY): Payer: Self-pay

## 2023-07-02 ENCOUNTER — Emergency Department (HOSPITAL_COMMUNITY): Payer: No Typology Code available for payment source

## 2023-07-02 ENCOUNTER — Other Ambulatory Visit: Payer: Self-pay

## 2023-07-02 DIAGNOSIS — Z7901 Long term (current) use of anticoagulants: Secondary | ICD-10-CM

## 2023-07-02 DIAGNOSIS — Z7984 Long term (current) use of oral hypoglycemic drugs: Secondary | ICD-10-CM

## 2023-07-02 DIAGNOSIS — A419 Sepsis, unspecified organism: Secondary | ICD-10-CM | POA: Diagnosis not present

## 2023-07-02 DIAGNOSIS — I4891 Unspecified atrial fibrillation: Secondary | ICD-10-CM | POA: Diagnosis not present

## 2023-07-02 DIAGNOSIS — R7989 Other specified abnormal findings of blood chemistry: Secondary | ICD-10-CM

## 2023-07-02 DIAGNOSIS — D649 Anemia, unspecified: Secondary | ICD-10-CM

## 2023-07-02 DIAGNOSIS — E669 Obesity, unspecified: Secondary | ICD-10-CM | POA: Diagnosis present

## 2023-07-02 DIAGNOSIS — Z880 Allergy status to penicillin: Secondary | ICD-10-CM

## 2023-07-02 DIAGNOSIS — I129 Hypertensive chronic kidney disease with stage 1 through stage 4 chronic kidney disease, or unspecified chronic kidney disease: Secondary | ICD-10-CM | POA: Diagnosis present

## 2023-07-02 DIAGNOSIS — N189 Chronic kidney disease, unspecified: Secondary | ICD-10-CM

## 2023-07-02 DIAGNOSIS — Z794 Long term (current) use of insulin: Secondary | ICD-10-CM

## 2023-07-02 DIAGNOSIS — W109XXA Fall (on) (from) unspecified stairs and steps, initial encounter: Secondary | ICD-10-CM | POA: Diagnosis present

## 2023-07-02 DIAGNOSIS — Y92019 Unspecified place in single-family (private) house as the place of occurrence of the external cause: Secondary | ICD-10-CM

## 2023-07-02 DIAGNOSIS — E871 Hypo-osmolality and hyponatremia: Secondary | ICD-10-CM | POA: Diagnosis present

## 2023-07-02 DIAGNOSIS — N179 Acute kidney failure, unspecified: Principal | ICD-10-CM

## 2023-07-02 DIAGNOSIS — F32A Depression, unspecified: Secondary | ICD-10-CM | POA: Diagnosis present

## 2023-07-02 DIAGNOSIS — K8001 Calculus of gallbladder with acute cholecystitis with obstruction: Secondary | ICD-10-CM

## 2023-07-02 DIAGNOSIS — Z9049 Acquired absence of other specified parts of digestive tract: Secondary | ICD-10-CM

## 2023-07-02 DIAGNOSIS — E7800 Pure hypercholesterolemia, unspecified: Secondary | ICD-10-CM | POA: Diagnosis present

## 2023-07-02 DIAGNOSIS — E1122 Type 2 diabetes mellitus with diabetic chronic kidney disease: Secondary | ICD-10-CM | POA: Diagnosis present

## 2023-07-02 DIAGNOSIS — I480 Paroxysmal atrial fibrillation: Secondary | ICD-10-CM | POA: Diagnosis present

## 2023-07-02 DIAGNOSIS — Z6832 Body mass index (BMI) 32.0-32.9, adult: Secondary | ICD-10-CM

## 2023-07-02 DIAGNOSIS — K219 Gastro-esophageal reflux disease without esophagitis: Secondary | ICD-10-CM | POA: Diagnosis present

## 2023-07-02 DIAGNOSIS — Z1152 Encounter for screening for COVID-19: Secondary | ICD-10-CM

## 2023-07-02 DIAGNOSIS — K660 Peritoneal adhesions (postprocedural) (postinfection): Secondary | ICD-10-CM | POA: Diagnosis present

## 2023-07-02 DIAGNOSIS — Z7985 Long-term (current) use of injectable non-insulin antidiabetic drugs: Secondary | ICD-10-CM

## 2023-07-02 DIAGNOSIS — S86811A Strain of other muscle(s) and tendon(s) at lower leg level, right leg, initial encounter: Secondary | ICD-10-CM

## 2023-07-02 DIAGNOSIS — L03115 Cellulitis of right lower limb: Secondary | ICD-10-CM

## 2023-07-02 DIAGNOSIS — E872 Acidosis, unspecified: Secondary | ICD-10-CM

## 2023-07-02 DIAGNOSIS — K812 Acute cholecystitis with chronic cholecystitis: Secondary | ICD-10-CM | POA: Diagnosis not present

## 2023-07-02 DIAGNOSIS — R933 Abnormal findings on diagnostic imaging of other parts of digestive tract: Secondary | ICD-10-CM

## 2023-07-02 DIAGNOSIS — S7011XA Contusion of right thigh, initial encounter: Secondary | ICD-10-CM | POA: Diagnosis present

## 2023-07-02 DIAGNOSIS — S76111A Strain of right quadriceps muscle, fascia and tendon, initial encounter: Secondary | ICD-10-CM | POA: Insufficient documentation

## 2023-07-02 DIAGNOSIS — Z87891 Personal history of nicotine dependence: Secondary | ICD-10-CM

## 2023-07-02 DIAGNOSIS — D62 Acute posthemorrhagic anemia: Secondary | ICD-10-CM | POA: Diagnosis not present

## 2023-07-02 DIAGNOSIS — E1110 Type 2 diabetes mellitus with ketoacidosis without coma: Secondary | ICD-10-CM | POA: Diagnosis not present

## 2023-07-02 DIAGNOSIS — N1831 Chronic kidney disease, stage 3a: Secondary | ICD-10-CM | POA: Diagnosis present

## 2023-07-02 DIAGNOSIS — Z79899 Other long term (current) drug therapy: Secondary | ICD-10-CM

## 2023-07-02 DIAGNOSIS — R000 Tachycardia, unspecified: Secondary | ICD-10-CM

## 2023-07-02 DIAGNOSIS — K82A1 Gangrene of gallbladder in cholecystitis: Secondary | ICD-10-CM | POA: Diagnosis not present

## 2023-07-02 DIAGNOSIS — R748 Abnormal levels of other serum enzymes: Secondary | ICD-10-CM

## 2023-07-02 DIAGNOSIS — R6520 Severe sepsis without septic shock: Secondary | ICD-10-CM | POA: Diagnosis present

## 2023-07-02 DIAGNOSIS — K838 Other specified diseases of biliary tract: Secondary | ICD-10-CM

## 2023-07-02 DIAGNOSIS — R17 Unspecified jaundice: Secondary | ICD-10-CM | POA: Insufficient documentation

## 2023-07-02 LAB — CBC WITH DIFFERENTIAL/PLATELET
Abs Immature Granulocytes: 0.1 10*3/uL — ABNORMAL HIGH (ref 0.00–0.07)
Basophils Absolute: 0.1 10*3/uL (ref 0.0–0.1)
Basophils Relative: 0 %
Eosinophils Absolute: 0 10*3/uL (ref 0.0–0.5)
Eosinophils Relative: 0 %
HCT: 34.7 % — ABNORMAL LOW (ref 39.0–52.0)
Hemoglobin: 11.1 g/dL — ABNORMAL LOW (ref 13.0–17.0)
Immature Granulocytes: 1 %
Lymphocytes Relative: 17 %
Lymphs Abs: 2.5 10*3/uL (ref 0.7–4.0)
MCH: 30.5 pg (ref 26.0–34.0)
MCHC: 32 g/dL (ref 30.0–36.0)
MCV: 95.3 fL (ref 80.0–100.0)
Monocytes Absolute: 1.5 10*3/uL — ABNORMAL HIGH (ref 0.1–1.0)
Monocytes Relative: 10 %
Neutro Abs: 10.1 10*3/uL — ABNORMAL HIGH (ref 1.7–7.7)
Neutrophils Relative %: 72 %
Platelets: 262 10*3/uL (ref 150–400)
RBC: 3.64 MIL/uL — ABNORMAL LOW (ref 4.22–5.81)
RDW: 14.5 % (ref 11.5–15.5)
WBC: 14.3 10*3/uL — ABNORMAL HIGH (ref 4.0–10.5)
nRBC: 0.6 % — ABNORMAL HIGH (ref 0.0–0.2)

## 2023-07-02 LAB — I-STAT VENOUS BLOOD GAS, ED
Acid-base deficit: 12 mmol/L — ABNORMAL HIGH (ref 0.0–2.0)
Bicarbonate: 12.1 mmol/L — ABNORMAL LOW (ref 20.0–28.0)
Calcium, Ion: 1.08 mmol/L — ABNORMAL LOW (ref 1.15–1.40)
HCT: 34 % — ABNORMAL LOW (ref 39.0–52.0)
Hemoglobin: 11.6 g/dL — ABNORMAL LOW (ref 13.0–17.0)
O2 Saturation: 83 %
Potassium: 4.7 mmol/L (ref 3.5–5.1)
Sodium: 131 mmol/L — ABNORMAL LOW (ref 135–145)
TCO2: 13 mmol/L — ABNORMAL LOW (ref 22–32)
pCO2, Ven: 22.1 mm[Hg] — ABNORMAL LOW (ref 44–60)
pH, Ven: 7.347 (ref 7.25–7.43)
pO2, Ven: 49 mm[Hg] — ABNORMAL HIGH (ref 32–45)

## 2023-07-02 LAB — COMPREHENSIVE METABOLIC PANEL
ALT: 16 U/L (ref 0–44)
AST: 22 U/L (ref 15–41)
Albumin: 3.3 g/dL — ABNORMAL LOW (ref 3.5–5.0)
Alkaline Phosphatase: 50 U/L (ref 38–126)
Anion gap: 21 — ABNORMAL HIGH (ref 5–15)
BUN: 46 mg/dL — ABNORMAL HIGH (ref 8–23)
CO2: 13 mmol/L — ABNORMAL LOW (ref 22–32)
Calcium: 9.4 mg/dL (ref 8.9–10.3)
Chloride: 100 mmol/L (ref 98–111)
Creatinine, Ser: 2.01 mg/dL — ABNORMAL HIGH (ref 0.61–1.24)
GFR, Estimated: 36 mL/min — ABNORMAL LOW (ref >60)
Glucose, Bld: 234 mg/dL — ABNORMAL HIGH (ref 70–99)
Potassium: 4.9 mmol/L (ref 3.5–5.1)
Sodium: 134 mmol/L — ABNORMAL LOW (ref 135–145)
Total Bilirubin: 3.2 mg/dL — ABNORMAL HIGH (ref <1.2)
Total Protein: 7.2 g/dL (ref 6.5–8.1)

## 2023-07-02 LAB — RESP PANEL BY RT-PCR (RSV, FLU A&B, COVID)  RVPGX2
Influenza A by PCR: NEGATIVE
Influenza B by PCR: NEGATIVE
Resp Syncytial Virus by PCR: NEGATIVE
SARS Coronavirus 2 by RT PCR: NEGATIVE

## 2023-07-02 LAB — I-STAT CG4 LACTIC ACID, ED: Lactic Acid, Venous: 3.2 mmol/L (ref 0.5–1.9)

## 2023-07-02 LAB — CK: Total CK: 330 U/L (ref 49–397)

## 2023-07-02 MED ORDER — VANCOMYCIN HCL 1.5 G IV SOLR
1500.0000 mg | Freq: Once | INTRAVENOUS | Status: AC
  Administered 2023-07-02: 1500 mg via INTRAVENOUS
  Filled 2023-07-02: qty 30

## 2023-07-02 MED ORDER — SODIUM CHLORIDE 0.9 % IV BOLUS
1000.0000 mL | Freq: Once | INTRAVENOUS | Status: AC
  Administered 2023-07-02: 1000 mL via INTRAVENOUS

## 2023-07-02 MED ORDER — MORPHINE SULFATE (PF) 4 MG/ML IV SOLN
4.0000 mg | Freq: Once | INTRAVENOUS | Status: AC
  Administered 2023-07-02: 4 mg via INTRAVENOUS
  Filled 2023-07-02: qty 1

## 2023-07-02 MED ORDER — LOSARTAN POTASSIUM 50 MG PO TABS
50.0000 mg | ORAL_TABLET | Freq: Once | ORAL | Status: AC
Start: 2023-07-02 — End: 2023-07-02
  Administered 2023-07-02: 50 mg via ORAL
  Filled 2023-07-02: qty 1

## 2023-07-02 MED ORDER — SODIUM CHLORIDE 0.9 % IV SOLN
2.0000 g | Freq: Once | INTRAVENOUS | Status: AC
  Administered 2023-07-02: 2 g via INTRAVENOUS
  Filled 2023-07-02: qty 12.5

## 2023-07-02 MED ORDER — IOHEXOL 350 MG/ML SOLN
60.0000 mL | Freq: Once | INTRAVENOUS | Status: AC | PRN
  Administered 2023-07-02: 60 mL via INTRAVENOUS

## 2023-07-02 MED ORDER — HYDROCODONE-ACETAMINOPHEN 5-325 MG PO TABS
1.0000 | ORAL_TABLET | Freq: Once | ORAL | Status: AC
  Administered 2023-07-02: 1 via ORAL
  Filled 2023-07-02: qty 1

## 2023-07-02 MED ORDER — VANCOMYCIN HCL IN DEXTROSE 1-5 GM/200ML-% IV SOLN
1000.0000 mg | Freq: Once | INTRAVENOUS | Status: AC
Start: 2023-07-02 — End: 2023-07-02
  Administered 2023-07-02: 1000 mg via INTRAVENOUS
  Filled 2023-07-02: qty 200

## 2023-07-02 NOTE — ED Provider Notes (Signed)
Care of patient received from prior provider at 4:41 PM, please see their note for complete H/P and care plan.  Received handoff per ED course.  Clinical Course as of 07/02/23 2315  Mon Jul 02, 2023  1527 Hemoglobin(!): 11.1 Down from 14 four mos ago. AKI, BUN is elev; will give IVF [SG]  1639 Stable  Fall a week ago.  Leg huts. CT hip pending.  [CC]  1641 Creatinine(!): 2.01 [SG]    Clinical Course User Index [CC] Glyn Ade, MD [SG] Sloan Leiter, DO    Reassessment: Patient continued to clinically deteriorate after handoff.  He has dense induration of the soft tissues from the top of his knee all the way up to his mid right thigh. Broaden his imaging to include CT of the femur area with contrast to evaluate for abscess. He is unable to ambulate due to this pain.  It does not involve the knee joint itself but rather the soft tissues around the thigh. Endorses chills.  Never developed a fever but given the findings of persistent sinus tachycardia I am worried for underlying infection.  Lab work performed and demonstrates leukocytosis and lactic acidosis.  He was treated broadly with vancomycin and cefepime for nonspecific cellulitis in the setting of recent injury.  The more I talked with the patient, it appears that part of his fall was that his leg was already bothering him.  I have a suspicion that his fall was caused by an already developing cellulitis now worsening and becoming more obvious.  CT Femur with contrast appears to show cellulitis on my read but I am waiting on formal radiographic evaluation. Care patient handed off to oncoming provider pending results of CT scan.  Anticipate medical admission for management of cellulitis.   Emergency Department Medication Summary:   Medications  vancomycin (VANCOCIN) IVPB 1000 mg/200 mL premix (0 mg Intravenous Stopped 07/02/23 2304)    Followed by  Vancomycin (VANCOCIN) 1,500 mg in sodium chloride 0.9 % 500 mL IVPB (1,500  mg Intravenous New Bag/Given 07/02/23 2305)  HYDROcodone-acetaminophen (NORCO/VICODIN) 5-325 MG per tablet 1 tablet (1 tablet Oral Given 07/02/23 1346)  losartan (COZAAR) tablet 50 mg (50 mg Oral Given 07/02/23 1700)  sodium chloride 0.9 % bolus 1,000 mL (0 mLs Intravenous Stopped 07/02/23 2011)  sodium chloride 0.9 % bolus 1,000 mL (0 mLs Intravenous Stopped 07/02/23 2011)  ceFEPIme (MAXIPIME) 2 g in sodium chloride 0.9 % 100 mL IVPB (0 g Intravenous Stopped 07/02/23 2145)  morphine (PF) 4 MG/ML injection 4 mg (4 mg Intravenous Given 07/02/23 2035)  iohexol (OMNIPAQUE) 350 MG/ML injection 60 mL (60 mLs Intravenous Contrast Given 07/02/23 2248)            Glyn Ade, MD 07/03/23 1507

## 2023-07-02 NOTE — ED Provider Triage Note (Signed)
Emergency Medicine Provider Triage Evaluation Note  Leroy Chen , a 67 y.o. male  was evaluated in triage.  Pt complains of right hip pain, cannot walk.  Patient was seen recently with a presumed quadricep tendon rupture, reports he has been having worsening pain since then, unable to walk, he is no longer wearing the immobilizer.  Review of Systems  Positive: Right thigh pain, right thigh compartment is firm Negative: Abdominal pain  Physical Exam  BP 120/65 (BP Location: Left Arm)   Pulse 74   Temp 98.7 F (37.1 C) (Oral)   Resp 18   Ht 6\' 1"  (1.854 m)   Wt 113.4 kg   SpO2 100%   BMI 32.98 kg/m  Gen:   Awake, no distress   Resp:  Normal effort  MSK:   Difficulty moving right lower extremity, right thigh compartment is firm.  LE NVI   Medical Decision Making  Medically screening exam initiated at 1:05 PM.  Appropriate orders placed.  Leroy Chen was informed that the remainder of the evaluation will be completed by another provider, this initial triage assessment does not replace that evaluation, and the importance of remaining in the ED until their evaluation is complete.  Get labs/imaging/analgesia.  Recent quadricep tendon rupture, no longer immobilizer, has not followed up with orthopedics.  He is anticoagulated.  Right thigh is firm.   Leroy Leiter, DO 07/02/23 1307

## 2023-07-02 NOTE — Progress Notes (Signed)
ED Pharmacy Antibiotic Sign Off An antibiotic consult was received from an ED provider for Vancomycin per pharmacy dosing for Cellulitis. A chart review was completed to assess appropriateness.   The following one time order(s) were placed:  Vancomyin 2500mg  IV (given as 1000mg  x1 followed by 1500mg  x1)  Further antibiotic and/or antibiotic pharmacy consults should be ordered by the admitting provider if indicated.   Thank you for allowing pharmacy to be a part of this patient's care.   Wilburn Cornelia, PharmD, BCPS Clinical Pharmacist 07/02/2023 8:15 PM   Please refer to [facility] for pharmacy phone number

## 2023-07-02 NOTE — ED Triage Notes (Signed)
Patient BIB [facility] from home for right leg pain redness, swelling, warmth above knee. Tingling, cool, and diminished pulses below the knee. Patient only missed eliquis this morning but has taken it as ordered previously. A&Ox4, difficult to bear weight b/c of pain.

## 2023-07-02 NOTE — ED Provider Notes (Addendum)
[place] EMERGENCY DEPARTMENT AT [facility] Provider Note  CSN: [id number] Arrival date & time: 07/02/23 1229  Chief Complaint(s) Leg Pain  HPI Leroy Chen is a 67 y.o. male with past medical history as below, significant for A-fib on DOAC, DM2, HLD, UC, CKD who presents to the ED with complaint of right hip pain.  Patient with a fall and seen in the ED on 12/11.  Concern for possible tendon injury.  Placed in knee immobilizer, given crutches, made nonweightbearing advised follow-up orthopedics.  Patient returns to the ER today secondary to ongoing pain to his right thigh, he has removed the knee immobilizer has been timid to ambulate without crutches.  diff ambulating due to right hip pain, pain to his right hip laterally and to the area just proximal to his knee.  Denies any further falls.  Past Medical History Past Medical History:  Diagnosis Date   Atrial fibrillation [facility]) March 2015   Diabetes mellitus without complication (HCC)    NIDDM, type II   High cholesterol    Hydradenitis    Sebaceous cyst    hx of   Sigmoid diverticulitis    Wears glasses    Patient Active Problem List   Diagnosis Date Noted   Heart block 04/21/2021   Ulcerative colitis (HCC) 04/21/2021   Coagulopathy (HCC) 04/21/2021   Hyponatremia 11/12/2020   CKD (chronic kidney disease), stage III (HCC) 11/12/2020   Diverticulitis large intestine 04/27/2015   Unspecified atrial fibrillation (HCC) 10/17/2014   Smoker 10/17/2014   Demand ischemia from AF with RVR-Troponin 0.07 10/17/2014   Diverticulitis of large intestine with abscess without bleeding 10/15/2014   Diverticulitis of intestine with abscess without bleeding 10/12/2014   Colonic diverticular abscess- s/p colectomy 10/15/14 10/12/2014   Folliculitis 10/12/2014   Sepsis (HCC) 10/12/2014   Insulin-requiring or dependent type II diabetes mellitus (HCC) 10/12/2014   Hidradenitis suppurativa 08/02/2012   Home  Medication(s) Prior to Admission medications   Medication Sig Start Date End Date Taking? Authorizing Provider  acetaminophen (TYLENOL) 500 MG tablet Take 1,000 mg by mouth 2 (two) times daily.    [provider]  Adalimumab 40 MG/0.4ML PNKT Inject 40 mg into the skin once a week. Mondays    [provider]  albuterol (PROVENTIL HFA;VENTOLIN HFA) 108 (90 Base) MCG/ACT inhaler Inhale 1-2 puffs into the lungs every 6 (six) hours as needed for wheezing or shortness of breath. 08/05/17   Wallis Bamberg, PA-C  Chlorhexidine Gluconate 4 % SOLN Apply 1 application topically See admin instructions. Apply to armpits & Groin 3 times a week in the shower. Leave on for 1 minute and then rinse    [provider]  clindamycin (CLEOCIN T) 1 % lotion Apply 1 application topically 2 (two) times daily. Draining bumps 03/14/19   [provider]  clobetasol (TEMOVATE) 0.05 % external solution Apply 1 application topically 2 (two) times daily as needed (infection on scalp).    [provider]  dicyclomine (BENTYL) 10 MG capsule Take 10 mg by mouth 3 (three) times daily as needed for spasms.    [provider]  empagliflozin (JARDIANCE) 25 MG TABS tablet Take 12.5 mg by mouth daily.    [provider]  Ensure (ENSURE) Take 237 mLs by mouth daily.    [provider]  ergocalciferol (VITAMIN D2) 1.25 MG (50000 UT) capsule Take 50,000 Units by mouth once a week. Mondays    [provider]  flecainide (TAMBOCOR) 50 MG tablet  Take 50 mg by mouth 2 (two) times daily.    [provider]  Fluocinolone Acetonide 0.01 % OIL Place 1 application in ear(s) 2 (two) times daily as needed (scaling).    [provider]  HYDROcodone-acetaminophen (NORCO/VICODIN) 5-325 MG tablet Take 1 tablet by mouth every 6 (six) hours as needed for severe pain.    [provider]  HYDROcodone-acetaminophen (NORCO/VICODIN) 5-325 MG tablet Take 1 tablet  by mouth every 6 (six) hours as needed. 06/27/23   Schutt, Edsel Petrin, PA-C  insulin glargine (LANTUS) 100 UNIT/ML Solostar Pen Inject 55 Units into the skin every evening. 04/03/19   [provider]  losartan (COZAAR) 50 MG tablet Take 50 mg by mouth daily.    [provider]  lurasidone (LATUDA) 40 MG TABS tablet Take 40 mg by mouth daily.    [provider]  melatonin 3 MG TABS tablet Take 3 mg by mouth at bedtime.    [provider]  metoprolol tartrate (LOPRESSOR) 100 MG tablet Take 1 tablet (100 mg total) by mouth 2 (two) times daily. 04/22/21 05/22/21  Lorin Glass, MD  Multiple Vitamin (MULTIVITAMIN) tablet Take 1 tablet by mouth daily.    [provider]  omeprazole (PRILOSEC) 20 MG capsule Take 20 mg by mouth 2 (two) times daily as needed (heartburn).    [provider]  rivaroxaban (XARELTO) 20 MG TABS tablet Take 20 mg by mouth daily.    [provider]  rosuvastatin (CRESTOR) 40 MG tablet Take 40 mg by mouth at bedtime.    [provider]  Semaglutide (OZEMPIC, 1 MG/DOSE, [place]) Inject 1 mg into the skin once a week. Mondays    [provider]  sertraline (ZOLOFT) 100 MG tablet Take 100 mg by mouth daily.    [provider]  Testosterone 1.62 % GEL Place 4 Pump onto the skin daily. Apply to upper arm or shoulder areas only    [provider]                                                                                                                                    Past Surgical History Past Surgical History:  Procedure Laterality Date   COLECTOMY  10/15/14   hand sugery     ILEO LOOP COLOSTOMY CLOSURE N/A 04/27/2015   Procedure: DIAGNOSTIC LAPAROSCOPY,  EXPLORATORY LAPAROTOMY WITH RESECTION AND CLOSURE OF COLOSTOMY ;  Surgeon: [name], MD;  Location: [facility] OR;  Service: General;  Laterality: N/A;   LAPAROTOMY N/A 10/15/2014   Procedure: EXPLORATORY LAPAROTOMY WITH DRAINAGE OF  INTRA- ABDOMINAL ABSCESS;  Surgeon: [name], MD;  Location: [facility] OR;  Service: General;  Laterality: N/A;   ORCHIECTOMY     PARTIAL COLECTOMY N/A 10/15/2014   Procedure: SIGMOID COLECTOMY WITH COLOSTOMY CREATION;  Surgeon: [name], MD;  Location: [facility] OR;  Service: General;  Laterality: N/A;   PILONIDAL CYST EXCISION N/A  01/01/2015   Procedure: EXCISION OF PILONIDAL DISEASE;  Surgeon: Romie Levee, MD;  Location: [facility];  Service: General;  Laterality: N/A;   Family History Family History  Problem Relation Age of Onset   Healthy Mother     Social History Social History   Tobacco Use   Smoking status: Former    Current packs/day: 0.00    Average packs/day: 0.3 packs/day for 20.0 years (5.0 ttl pk-yrs)    Types: Cigarettes    Start date: 74    Quit date: 2018    Years since quitting: 6.9   Smokeless tobacco: Never  Vaping Use   Vaping status: Never Used  Substance Use Topics   Alcohol use: Yes    Comment: Social- moderate - one beer at the wedding     Drug use: No   Allergies Penicillins  Review of Systems Review of Systems  Constitutional:  Negative for fever.  Respiratory:  Positive for cough. Negative for chest tightness and shortness of breath.   Gastrointestinal:  Negative for abdominal pain.  Genitourinary:  Negative for difficulty urinating and urgency.  Musculoskeletal:  Positive for arthralgias, gait problem and myalgias.  Neurological:  Negative for light-headedness and numbness.  All other systems reviewed and are negative.   Physical Exam Vital Signs  I have reviewed the triage vital signs BP (!) 142/76   Pulse (!) 133   Temp 98.7 F (37.1 C) (Oral)   Resp 20   Ht 6\' 1"  (1.854 m)   Wt 113.4 kg   SpO2 97%   BMI 32.98 kg/m  Physical Exam Vitals and nursing note reviewed.  Constitutional:      General: He is not in acute distress.    Appearance: He is well-developed.  HENT:     Head: Normocephalic and atraumatic.      Right Ear: External ear normal.     Left Ear: External ear normal.     Mouth/Throat:     Mouth: Mucous membranes are moist.  Eyes:     General: No scleral icterus. Cardiovascular:     Rate and Rhythm: Normal rate and regular rhythm.     Pulses: Normal pulses.     Heart sounds: Normal heart sounds.  Pulmonary:     Effort: Pulmonary effort is normal. No respiratory distress.     Breath sounds: Normal breath sounds.  Abdominal:     General: Abdomen is flat.     Palpations: Abdomen is soft.     Tenderness: There is no abdominal tenderness. There is no guarding or rebound.  Musculoskeletal:     Cervical back: No rigidity.     Right lower leg: No edema.     Left lower leg: No edema.       Legs:     Comments: Pain and swelling noted to right thigh.  LE NVI.  Difficulty in completing range of motion testing secondary to pain.  No midline spinous process tenderness to palpation or percussion, no crepitus or step-off.     Skin:    General: Skin is warm and dry.     Capillary Refill: Capillary refill takes less than 2 seconds.  Neurological:     Mental Status: He is alert.  Psychiatric:        Mood and Affect: Mood normal.        Behavior: Behavior normal.     ED Results and Treatments Labs (all labs ordered are listed, but only abnormal results are displayed) Labs Reviewed  CBC WITH DIFFERENTIAL/PLATELET - Abnormal; Notable for the following components:      Result Value   WBC 14.3 (*)    RBC 3.64 (*)    Hemoglobin 11.1 (*)    HCT 34.7 (*)    nRBC 0.6 (*)    Neutro Abs 10.1 (*)    Monocytes Absolute 1.5 (*)    Abs Immature Granulocytes 0.10 (*)    All other components within normal limits  COMPREHENSIVE METABOLIC PANEL - Abnormal; Notable for the following components:   Sodium 134 (*)    CO2 13 (*)    Glucose, Bld 234 (*)    BUN 46 (*)    Creatinine, Ser 2.01 (*)    Albumin 3.3 (*)    Total Bilirubin 3.2 (*)    GFR, Estimated 36 (*)    Anion gap 21 (*)     All other components within normal limits  I-STAT VENOUS BLOOD GAS, ED - Abnormal; Notable for the following components:   pCO2, Ven 22.1 (*)    pO2, Ven 49 (*)    Bicarbonate 12.1 (*)    TCO2 13 (*)    Acid-base deficit 12.0 (*)    Sodium 131 (*)    Calcium, Ion 1.08 (*)    HCT 34.0 (*)    Hemoglobin 11.6 (*)    All other components within normal limits  RESP PANEL BY RT-PCR (RSV, FLU A&B, COVID)  RVPGX2  CK                                                                                                                          Radiology CT HIP RIGHT WO CONTRAST Result Date: 07/02/2023 CLINICAL DATA:  Hip trauma, fracture suspected, xray done. EXAM: CT OF THE RIGHT HIP WITHOUT CONTRAST TECHNIQUE: Multidetector CT imaging of the right hip was performed according to the standard protocol. Multiplanar CT image reconstructions were also generated. RADIATION DOSE REDUCTION: This exam was performed according to the departmental dose-optimization program which includes automated exposure control, adjustment of the mA and/or kV according to patient size and/or use of iterative reconstruction technique. COMPARISON:  None Available. FINDINGS: Bones/Joint/Cartilage No acute fracture or dislocation. No aggressive osseous lesion. No significant degenerative changes of right hip joint. Probable mild chronic right sacroiliitis. Ligaments Suboptimally assessed by CT. Muscles and Tendons Suboptimally assessed by CT scan.  No gross abnormality seen. Soft tissues Unremarkable urinary bladder. No bowel obstruction. Normal appendix. Partially seen penile prosthesis. IMPRESSION: *No acute osseous injury of the right hip. *Multiple other nonacute observations, as described above. Electronically Signed   By: Jules Schick M.D.   On: 07/02/2023 17:30   DG Chest 1 View Result Date: 07/02/2023 CLINICAL DATA:  Cough.  Right leg redness EXAM: CHEST  1 VIEW COMPARISON:  02/12/2023 FINDINGS: AP upright radiograph  demonstrates midline trachea. Normal heart size for level of inspiration. Mild left hemidiaphragm elevation. No pleural effusion or pneumothorax. Clear lungs. IMPRESSION: No active disease. Electronically Signed  By: Jeronimo Greaves M.D.   On: 07/02/2023 15:19    Pertinent labs & imaging results that were available during my care of the patient were reviewed by me and considered in my medical decision making (see MDM for details).  Medications Ordered in ED Medications  HYDROcodone-acetaminophen (NORCO/VICODIN) 5-325 MG per tablet 1 tablet (1 tablet Oral Given 07/02/23 1346)  losartan (COZAAR) tablet 50 mg (50 mg Oral Given 07/02/23 1700)  sodium chloride 0.9 % bolus 1,000 mL (1,000 mLs Intravenous New Bag/Given 07/02/23 1701)  sodium chloride 0.9 % bolus 1,000 mL (1,000 mLs Intravenous New Bag/Given 07/02/23 1702)                                                                                                                                     Procedures .Critical Care  Performed by: Sloan Leiter, DO Authorized by: Sloan Leiter, DO   Critical care provider statement:    Critical care time (minutes):  32   Critical care time was exclusive of:  Separately billable procedures and treating other patients   Critical care was necessary to treat or prevent imminent or life-threatening deterioration of the following conditions:  Renal failure   Critical care was time spent personally by me on the following activities:  Development of treatment plan with patient or surrogate, discussions with consultants, evaluation of patient's response to treatment, examination of patient, ordering and review of laboratory studies, ordering and review of radiographic studies, ordering and performing treatments and interventions, pulse oximetry, re-evaluation of patient's condition, review of old charts and obtaining history from patient or surrogate   (including critical care time)  Medical Decision Making /  ED Course    Medical Decision Making:    Leroy Chen is a 67 y.o. male with past medical history as below, significant for A-fib on DOAC, DM2, HLD, UC, CKD who presents to the ED with complaint of right hip pain.. The complaint involves an extensive differential diagnosis and also carries with it a high risk of complications and morbidity.  Serious etiology was considered. Ddx includes but is not limited to: MSK injury, tendinopathy, fracture, compartment syndrome, strain, hematoma, etc.  Complete initial physical exam performed, notably the patient was in no acute distress, sitting in wheelchair.    Reviewed and confirmed nursing documentation for past medical history, family history, social history.  Vital signs reviewed.    Clinical Course as of 07/02/23 1734  Mon Jul 02, 2023  1527 Hemoglobin(!): 11.1 Down from 14 four mos ago. AKI, BUN is elev; will give IVF [SG]  1639 Stable  Fall a week ago.  Leg huts. CT hip pending.  [CC]  1641 Creatinine(!): 2.01 [SG]    Clinical Course User Index [CC] Glyn Ade, MD [SG] Sloan Leiter, DO    Brief summary: 67 year old male with history as above including A-fib on DOAC here with repeat visit for right hip  pain.  Right hip is TTP on exam, compartment is somewhat firm but not rigid.  Reduced range of motion secondary to pain.  LE NVI.  Will check screening labs, get CK, get x-ray of the chest is complaining of cough recently also get CT of the hip to evaluate for possible occult fracture.  Ongoing pain to his right hip.  BUN/Creatinine elevated from baseline, hemoglobin is also reduced; he is on DOAC.  He has leukocytosis.  T. bili is also elevated unclear etiology; prior was 2.9.  No jaundice or fever.  Handoff to incoming EDP pending imaging and recheck.  Would favor obs/admission at this time for AKI, pain control, serial compartment checks of right thigh; handoff to incoming edp at this time.                  Additional history obtained: -Additional history obtained from na -External records from outside source obtained and reviewed including: Chart review including previous notes, labs, imaging, consultation notes including  Recent ED visit, home medications, prior labs   Lab Tests: -I ordered, reviewed, and interpreted labs.   The pertinent results include:   Labs Reviewed  CBC WITH DIFFERENTIAL/PLATELET - Abnormal; Notable for the following components:      Result Value   WBC 14.3 (*)    RBC 3.64 (*)    Hemoglobin 11.1 (*)    HCT 34.7 (*)    nRBC 0.6 (*)    Neutro Abs 10.1 (*)    Monocytes Absolute 1.5 (*)    Abs Immature Granulocytes 0.10 (*)    All other components within normal limits  COMPREHENSIVE METABOLIC PANEL - Abnormal; Notable for the following components:   Sodium 134 (*)    CO2 13 (*)    Glucose, Bld 234 (*)    BUN 46 (*)    Creatinine, Ser 2.01 (*)    Albumin 3.3 (*)    Total Bilirubin 3.2 (*)    GFR, Estimated 36 (*)    Anion gap 21 (*)    All other components within normal limits  I-STAT VENOUS BLOOD GAS, ED - Abnormal; Notable for the following components:   pCO2, Ven 22.1 (*)    pO2, Ven 49 (*)    Bicarbonate 12.1 (*)    TCO2 13 (*)    Acid-base deficit 12.0 (*)    Sodium 131 (*)    Calcium, Ion 1.08 (*)    HCT 34.0 (*)    Hemoglobin 11.6 (*)    All other components within normal limits  RESP PANEL BY RT-PCR (RSV, FLU A&B, COVID)  RVPGX2  CK    Notable for as above, AKI, elevated BUN, elevated bili  EKG   EKG Interpretation Date/Time:    Ventricular Rate:    PR Interval:    QRS Duration:    QT Interval:    QTC Calculation:   R Axis:      Text Interpretation:           Imaging Studies ordered: I ordered imaging studies including chest x-ray, CT hip/CT pending I independently visualized the following imaging with scope of interpretation limited to determining acute life threatening conditions related to  emergency care; findings noted above I independently visualized and interpreted imaging. I agree with the radiologist interpretation   Medicines ordered and prescription drug management: Meds ordered this encounter  Medications   HYDROcodone-acetaminophen (NORCO/VICODIN) 5-325 MG per tablet 1 tablet    Refill:  0   losartan (COZAAR) tablet 50 mg  sodium chloride 0.9 % bolus 1,000 mL   sodium chloride 0.9 % bolus 1,000 mL    -I have reviewed the patients home medicines and have made adjustments as needed   Consultations Obtained: na   Cardiac Monitoring: Continuous pulse oximetry interpreted by myself, 98% on RA.    Social Determinants of Health:  Diagnosis or treatment significantly limited by social determinants of health: former smoker   Reevaluation: After the interventions noted above, I reevaluated the patient and found that they have improved  Co morbidities that complicate the patient evaluation  Past Medical History:  Diagnosis Date   Atrial fibrillation [facility]) March 2015   Diabetes mellitus without complication (HCC)    NIDDM, type II   High cholesterol    Hydradenitis    Sebaceous cyst    hx of   Sigmoid diverticulitis    Wears glasses       Dispostion: Disposition decision including need for hospitalization was considered, and patient disposition pending at time of sign out.    Final Clinical Impression(s) / ED Diagnoses Final diagnoses:  AKI (acute kidney injury) (HCC)  Elevated bilirubin        Sloan Leiter, DO 07/02/23 1733    Sloan Leiter, DO 07/02/23 1734

## 2023-07-03 ENCOUNTER — Inpatient Hospital Stay (HOSPITAL_COMMUNITY): Payer: No Typology Code available for payment source

## 2023-07-03 DIAGNOSIS — Z7901 Long term (current) use of anticoagulants: Secondary | ICD-10-CM | POA: Diagnosis not present

## 2023-07-03 DIAGNOSIS — E872 Acidosis, unspecified: Secondary | ICD-10-CM

## 2023-07-03 DIAGNOSIS — I4891 Unspecified atrial fibrillation: Secondary | ICD-10-CM | POA: Diagnosis present

## 2023-07-03 DIAGNOSIS — E871 Hypo-osmolality and hyponatremia: Secondary | ICD-10-CM | POA: Diagnosis present

## 2023-07-03 DIAGNOSIS — E119 Type 2 diabetes mellitus without complications: Secondary | ICD-10-CM | POA: Diagnosis not present

## 2023-07-03 DIAGNOSIS — E1122 Type 2 diabetes mellitus with diabetic chronic kidney disease: Secondary | ICD-10-CM | POA: Diagnosis present

## 2023-07-03 DIAGNOSIS — S76111A Strain of right quadriceps muscle, fascia and tendon, initial encounter: Secondary | ICD-10-CM | POA: Diagnosis present

## 2023-07-03 DIAGNOSIS — Y92019 Unspecified place in single-family (private) house as the place of occurrence of the external cause: Secondary | ICD-10-CM | POA: Diagnosis not present

## 2023-07-03 DIAGNOSIS — M7989 Other specified soft tissue disorders: Secondary | ICD-10-CM

## 2023-07-03 DIAGNOSIS — R6520 Severe sepsis without septic shock: Secondary | ICD-10-CM | POA: Diagnosis present

## 2023-07-03 DIAGNOSIS — K8000 Calculus of gallbladder with acute cholecystitis without obstruction: Secondary | ICD-10-CM | POA: Diagnosis not present

## 2023-07-03 DIAGNOSIS — I480 Paroxysmal atrial fibrillation: Secondary | ICD-10-CM | POA: Diagnosis present

## 2023-07-03 DIAGNOSIS — E1110 Type 2 diabetes mellitus with ketoacidosis without coma: Secondary | ICD-10-CM | POA: Diagnosis not present

## 2023-07-03 DIAGNOSIS — E7800 Pure hypercholesterolemia, unspecified: Secondary | ICD-10-CM | POA: Diagnosis present

## 2023-07-03 DIAGNOSIS — K219 Gastro-esophageal reflux disease without esophagitis: Secondary | ICD-10-CM | POA: Diagnosis present

## 2023-07-03 DIAGNOSIS — K812 Acute cholecystitis with chronic cholecystitis: Secondary | ICD-10-CM | POA: Diagnosis not present

## 2023-07-03 DIAGNOSIS — I129 Hypertensive chronic kidney disease with stage 1 through stage 4 chronic kidney disease, or unspecified chronic kidney disease: Secondary | ICD-10-CM | POA: Diagnosis present

## 2023-07-03 DIAGNOSIS — R7989 Other specified abnormal findings of blood chemistry: Secondary | ICD-10-CM | POA: Diagnosis not present

## 2023-07-03 DIAGNOSIS — L03115 Cellulitis of right lower limb: Secondary | ICD-10-CM | POA: Diagnosis present

## 2023-07-03 DIAGNOSIS — F32A Depression, unspecified: Secondary | ICD-10-CM | POA: Diagnosis present

## 2023-07-03 DIAGNOSIS — E669 Obesity, unspecified: Secondary | ICD-10-CM | POA: Diagnosis present

## 2023-07-03 DIAGNOSIS — Z6832 Body mass index (BMI) 32.0-32.9, adult: Secondary | ICD-10-CM | POA: Diagnosis not present

## 2023-07-03 DIAGNOSIS — K769 Liver disease, unspecified: Secondary | ICD-10-CM | POA: Diagnosis not present

## 2023-07-03 DIAGNOSIS — N179 Acute kidney failure, unspecified: Secondary | ICD-10-CM | POA: Diagnosis present

## 2023-07-03 DIAGNOSIS — R17 Unspecified jaundice: Secondary | ICD-10-CM | POA: Insufficient documentation

## 2023-07-03 DIAGNOSIS — W109XXA Fall (on) (from) unspecified stairs and steps, initial encounter: Secondary | ICD-10-CM | POA: Diagnosis present

## 2023-07-03 DIAGNOSIS — S86811A Strain of other muscle(s) and tendon(s) at lower leg level, right leg, initial encounter: Secondary | ICD-10-CM | POA: Diagnosis not present

## 2023-07-03 DIAGNOSIS — D649 Anemia, unspecified: Secondary | ICD-10-CM

## 2023-07-03 DIAGNOSIS — Z1152 Encounter for screening for COVID-19: Secondary | ICD-10-CM | POA: Diagnosis not present

## 2023-07-03 DIAGNOSIS — A419 Sepsis, unspecified organism: Secondary | ICD-10-CM | POA: Diagnosis present

## 2023-07-03 DIAGNOSIS — R1011 Right upper quadrant pain: Secondary | ICD-10-CM | POA: Diagnosis not present

## 2023-07-03 DIAGNOSIS — K838 Other specified diseases of biliary tract: Secondary | ICD-10-CM | POA: Diagnosis not present

## 2023-07-03 DIAGNOSIS — R000 Tachycardia, unspecified: Secondary | ICD-10-CM

## 2023-07-03 DIAGNOSIS — N189 Chronic kidney disease, unspecified: Secondary | ICD-10-CM

## 2023-07-03 DIAGNOSIS — Z7984 Long term (current) use of oral hypoglycemic drugs: Secondary | ICD-10-CM | POA: Diagnosis not present

## 2023-07-03 DIAGNOSIS — N1831 Chronic kidney disease, stage 3a: Secondary | ICD-10-CM | POA: Diagnosis present

## 2023-07-03 DIAGNOSIS — Z794 Long term (current) use of insulin: Secondary | ICD-10-CM | POA: Diagnosis not present

## 2023-07-03 DIAGNOSIS — K82A1 Gangrene of gallbladder in cholecystitis: Secondary | ICD-10-CM | POA: Diagnosis not present

## 2023-07-03 DIAGNOSIS — R933 Abnormal findings on diagnostic imaging of other parts of digestive tract: Secondary | ICD-10-CM | POA: Diagnosis not present

## 2023-07-03 DIAGNOSIS — Z87891 Personal history of nicotine dependence: Secondary | ICD-10-CM | POA: Diagnosis not present

## 2023-07-03 DIAGNOSIS — D62 Acute posthemorrhagic anemia: Secondary | ICD-10-CM | POA: Diagnosis not present

## 2023-07-03 DIAGNOSIS — R748 Abnormal levels of other serum enzymes: Secondary | ICD-10-CM | POA: Diagnosis not present

## 2023-07-03 LAB — CBC
HCT: 28.8 % — ABNORMAL LOW (ref 39.0–52.0)
Hemoglobin: 9 g/dL — ABNORMAL LOW (ref 13.0–17.0)
MCH: 30.7 pg (ref 26.0–34.0)
MCHC: 31.3 g/dL (ref 30.0–36.0)
MCV: 98.3 fL (ref 80.0–100.0)
Platelets: 203 10*3/uL (ref 150–400)
RBC: 2.93 MIL/uL — ABNORMAL LOW (ref 4.22–5.81)
RDW: 14.8 % (ref 11.5–15.5)
WBC: 9.7 10*3/uL (ref 4.0–10.5)
nRBC: 0.5 % — ABNORMAL HIGH (ref 0.0–0.2)

## 2023-07-03 LAB — CBG MONITORING, ED
Glucose-Capillary: 154 mg/dL — ABNORMAL HIGH (ref 70–99)
Glucose-Capillary: 157 mg/dL — ABNORMAL HIGH (ref 70–99)

## 2023-07-03 LAB — RETICULOCYTES
Immature Retic Fract: 42.3 % — ABNORMAL HIGH (ref 2.3–15.9)
RBC.: 2.89 MIL/uL — ABNORMAL LOW (ref 4.22–5.81)
Retic Count, Absolute: 134.4 10*3/uL (ref 19.0–186.0)
Retic Ct Pct: 4.7 % — ABNORMAL HIGH (ref 0.4–3.1)

## 2023-07-03 LAB — I-STAT CG4 LACTIC ACID, ED: Lactic Acid, Venous: 2.5 mmol/L (ref 0.5–1.9)

## 2023-07-03 LAB — COMPREHENSIVE METABOLIC PANEL
ALT: 15 U/L (ref 0–44)
AST: 20 U/L (ref 15–41)
Albumin: 2.7 g/dL — ABNORMAL LOW (ref 3.5–5.0)
Alkaline Phosphatase: 42 U/L (ref 38–126)
Anion gap: 15 (ref 5–15)
BUN: 36 mg/dL — ABNORMAL HIGH (ref 8–23)
CO2: 15 mmol/L — ABNORMAL LOW (ref 22–32)
Calcium: 7.9 mg/dL — ABNORMAL LOW (ref 8.9–10.3)
Chloride: 108 mmol/L (ref 98–111)
Creatinine, Ser: 1.85 mg/dL — ABNORMAL HIGH (ref 0.61–1.24)
GFR, Estimated: 40 mL/min — ABNORMAL LOW (ref >60)
Glucose, Bld: 181 mg/dL — ABNORMAL HIGH (ref 70–99)
Potassium: 4.4 mmol/L (ref 3.5–5.1)
Sodium: 138 mmol/L (ref 135–145)
Total Bilirubin: 2.8 mg/dL — ABNORMAL HIGH (ref <1.2)
Total Protein: 5.7 g/dL — ABNORMAL LOW (ref 6.5–8.1)

## 2023-07-03 LAB — IRON AND TIBC
Iron: 61 ug/dL (ref 45–182)
Saturation Ratios: 27 % (ref 17.9–39.5)
TIBC: 228 ug/dL — ABNORMAL LOW (ref 250–450)
UIBC: 167 ug/dL

## 2023-07-03 LAB — HIV ANTIBODY (ROUTINE TESTING W REFLEX): HIV Screen 4th Generation wRfx: NONREACTIVE

## 2023-07-03 LAB — HEMOGLOBIN A1C
Hgb A1c MFr Bld: 7.6 % — ABNORMAL HIGH (ref 4.8–5.6)
Mean Plasma Glucose: 171.42 mg/dL

## 2023-07-03 LAB — FOLATE: Folate: 25.4 ng/mL (ref >5.9)

## 2023-07-03 LAB — GLUCOSE, CAPILLARY
Glucose-Capillary: 199 mg/dL — ABNORMAL HIGH (ref 70–99)
Glucose-Capillary: 155 mg/dL — ABNORMAL HIGH (ref 70–99)

## 2023-07-03 LAB — TSH: TSH: 1.21 u[IU]/mL (ref 0.350–4.500)

## 2023-07-03 LAB — LACTIC ACID, PLASMA: Lactic Acid, Venous: 1.3 mmol/L (ref 0.5–1.9)

## 2023-07-03 LAB — VITAMIN B12: Vitamin B-12: 471 pg/mL (ref 180–914)

## 2023-07-03 LAB — MAGNESIUM: Magnesium: 2.3 mg/dL (ref 1.7–2.4)

## 2023-07-03 LAB — BETA-HYDROXYBUTYRIC ACID: Beta-Hydroxybutyric Acid: 5.59 mmol/L — ABNORMAL HIGH (ref 0.05–0.27)

## 2023-07-03 LAB — FERRITIN: Ferritin: 196 ng/mL (ref 24–336)

## 2023-07-03 MED ORDER — RIVAROXABAN 20 MG PO TABS
20.0000 mg | ORAL_TABLET | Freq: Every day | ORAL | Status: DC
  Administered 2023-07-03: 20 mg via ORAL
  Filled 2023-07-03: qty 1

## 2023-07-03 MED ORDER — FENTANYL CITRATE PF 50 MCG/ML IJ SOSY
50.0000 ug | PREFILLED_SYRINGE | INTRAMUSCULAR | Status: DC | PRN
  Administered 2023-07-03: 50 ug via INTRAVENOUS
  Filled 2023-07-03: qty 1

## 2023-07-03 MED ORDER — ACETAMINOPHEN 650 MG RE SUPP
650.0000 mg | Freq: Four times a day (QID) | RECTAL | Status: DC | PRN
Start: 2023-07-03 — End: 2023-07-06

## 2023-07-03 MED ORDER — SERTRALINE HCL 100 MG PO TABS
100.0000 mg | ORAL_TABLET | Freq: Every day | ORAL | Status: DC
  Administered 2023-07-03 – 2023-07-16 (×14): 100 mg via ORAL
  Filled 2023-07-03 (×14): qty 1

## 2023-07-03 MED ORDER — NALOXONE HCL 0.4 MG/ML IJ SOLN: 0.4000 mg | INTRAMUSCULAR | Status: DC | PRN

## 2023-07-03 MED ORDER — PANTOPRAZOLE SODIUM 40 MG PO TBEC
40.0000 mg | DELAYED_RELEASE_TABLET | Freq: Every day | ORAL | Status: DC
  Administered 2023-07-03 – 2023-07-16 (×13): 40 mg via ORAL
  Filled 2023-07-03 (×13): qty 1

## 2023-07-03 MED ORDER — SODIUM CHLORIDE 0.9 % IV BOLUS (SEPSIS): 1000.0000 mL | Freq: Once | INTRAVENOUS | Status: DC

## 2023-07-03 MED ORDER — VANCOMYCIN HCL 1250 MG/250ML IV SOLN
1250.0000 mg | INTRAVENOUS | Status: DC
  Administered 2023-07-03 – 2023-07-04 (×2): 1250 mg via INTRAVENOUS
  Filled 2023-07-03 (×2): qty 250

## 2023-07-03 MED ORDER — SODIUM CHLORIDE 0.9 % IV BOLUS (SEPSIS)
1000.0000 mL | Freq: Once | INTRAVENOUS | Status: AC
Start: 2023-07-03 — End: 2023-07-03
  Administered 2023-07-03: 1000 mL via INTRAVENOUS

## 2023-07-03 MED ORDER — OXYCODONE HCL 5 MG PO TABS
5.0000 mg | ORAL_TABLET | Freq: Four times a day (QID) | ORAL | Status: DC | PRN
  Administered 2023-07-03 – 2023-07-06 (×6): 5 mg via ORAL
  Filled 2023-07-03 (×6): qty 1

## 2023-07-03 MED ORDER — METOPROLOL TARTRATE 50 MG PO TABS
50.0000 mg | ORAL_TABLET | Freq: Two times a day (BID) | ORAL | Status: DC
Start: 2023-07-03 — End: 2023-07-08
  Administered 2023-07-03 – 2023-07-08 (×12): 50 mg via ORAL
  Filled 2023-07-03 (×5): qty 1
  Filled 2023-07-03: qty 2
  Filled 2023-07-03 (×3): qty 1
  Filled 2023-07-03: qty 2
  Filled 2023-07-03 (×2): qty 1

## 2023-07-03 MED ORDER — ACETAMINOPHEN 325 MG PO TABS: 650.0000 mg | ORAL_TABLET | Freq: Four times a day (QID) | ORAL | Status: DC | PRN

## 2023-07-03 MED ORDER — ROSUVASTATIN CALCIUM 20 MG PO TABS
40.0000 mg | ORAL_TABLET | Freq: Every day | ORAL | Status: DC
  Administered 2023-07-03 – 2023-07-10 (×7): 40 mg via ORAL
  Filled 2023-07-03 (×8): qty 2

## 2023-07-03 MED ORDER — FLECAINIDE ACETATE 50 MG PO TABS
50.0000 mg | ORAL_TABLET | Freq: Two times a day (BID) | ORAL | Status: DC
Start: 2023-07-03 — End: 2023-07-16
  Administered 2023-07-03 – 2023-07-16 (×27): 50 mg via ORAL
  Filled 2023-07-03 (×30): qty 1

## 2023-07-03 MED ORDER — CEFEPIME HCL 2 G IV SOLR
2.0000 g | Freq: Two times a day (BID) | INTRAVENOUS | Status: DC
  Administered 2023-07-03 – 2023-07-05 (×5): 2 g via INTRAVENOUS
  Filled 2023-07-03 (×5): qty 12.5

## 2023-07-03 MED ORDER — INSULIN ASPART 100 UNIT/ML IJ SOLN: 0.0000 [IU] | Freq: Every day | INTRAMUSCULAR | Status: DC

## 2023-07-03 MED ORDER — SODIUM CHLORIDE 0.9 % IV SOLN: INTRAVENOUS | Status: AC

## 2023-07-03 MED ORDER — SODIUM CHLORIDE 0.9 % IV BOLUS (SEPSIS)
500.0000 mL | Freq: Once | INTRAVENOUS | Status: AC
  Administered 2023-07-03: 500 mL via INTRAVENOUS

## 2023-07-03 MED ORDER — MORPHINE SULFATE (PF) 2 MG/ML IV SOLN
1.0000 mg | INTRAVENOUS | Status: DC | PRN
  Administered 2023-07-03 – 2023-07-06 (×9): 1 mg via INTRAVENOUS
  Filled 2023-07-03 (×9): qty 1

## 2023-07-03 MED ORDER — INSULIN ASPART 100 UNIT/ML IJ SOLN
0.0000 [IU] | Freq: Three times a day (TID) | INTRAMUSCULAR | Status: DC
  Administered 2023-07-03 (×3): 2 [IU] via SUBCUTANEOUS

## 2023-07-03 MED ORDER — RIVAROXABAN 10 MG PO TABS: 20.0000 mg | ORAL_TABLET | Freq: Every day | ORAL | Status: DC

## 2023-07-03 MED ORDER — INSULIN GLARGINE-YFGN 100 UNIT/ML ~~LOC~~ SOLN
58.0000 [IU] | Freq: Every day | SUBCUTANEOUS | Status: DC
  Administered 2023-07-03: 58 [IU] via SUBCUTANEOUS
  Filled 2023-07-03 (×2): qty 0.58

## 2023-07-03 MED ORDER — SODIUM CHLORIDE 0.9% FLUSH
10.0000 mL | Freq: Two times a day (BID) | INTRAVENOUS | Status: DC
Start: 2023-07-03 — End: 2023-07-03

## 2023-07-03 NOTE — Sepsis Progress Note (Signed)
Elink monitoring for the code sepsis protocol.  

## 2023-07-03 NOTE — ED Provider Notes (Signed)
I assumed care in signout. CT imaging was performed without evidence of gas or fluid collection. Patient is awake alert he does have episodes of A-fib with RVR noted appears to improve spontaneously Lactic acid is elevated, code sepsis called and continued IV fluids been ordered Patient is otherwise stable at this time Discussed with Dr. Loney Loh for admission   Zadie Rhine, MD 07/03/23 [phone]

## 2023-07-03 NOTE — Sepsis Progress Note (Signed)
Notified provider of need to draw repeat lactic acid via secure chat.

## 2023-07-03 NOTE — Progress Notes (Signed)
PHARMACY ANTIBIOTIC CONSULT NOTE   Leroy Chen a 67 y.o. male p/w R hip pain with erythema and swelling following a recent fall .  Pharmacy has been consulted for vancomycin and cefepime dosing.  Patient is s/p Vancomycin 2,500 mg IV load 12/16 ~2200.   12/17: Scr 1.85 (?BL~1.6), WBC 9.7  Vital Signs: afebrile, BP soft  Estimated Creatinine Clearance: 51.8 mL/min (A) (by C-G formula based on SCr of 1.85 mg/dL (H)).  Plan: START Cefepime 2g IV Q12H START Vancomycin 1,250 mg IV Q24H (eAUC 534, Vd used 0.5, Scr 1.85)  Monitor renal function, clinical status, de-escalation, C/S, levels as indicated   Allergies:  Allergies  Allergen Reactions   Penicillins Swelling    Filed Weights   07/02/23 1239  Weight: 113.4 kg (250 lb)       Latest Ref Rng & Units 07/03/2023    4:06 AM 07/02/2023    1:49 PM 07/02/2023    1:36 PM  CBC  WBC 4.0 - 10.5 K/uL 9.7   14.3   Hemoglobin 13.0 - 17.0 g/dL 9.0  40.9  81.1   Hematocrit 39.0 - 52.0 % 28.8  34.0  34.7   Platelets 150 - 400 K/uL 203   262     Antibiotics Given (last 72 hours)     Date/Time Action Medication Dose Rate   07/02/23 2030 New Bag/Given   ceFEPIme (MAXIPIME) 2 g in sodium chloride 0.9 % 100 mL IVPB 2 g 200 mL/hr   07/02/23 2146 New Bag/Given   vancomycin (VANCOCIN) IVPB 1000 mg/200 mL premix 1,000 mg 200 mL/hr   07/02/23 2305 New Bag/Given   Vancomycin (VANCOCIN) 1,500 mg in sodium chloride 0.9 % 500 mL IVPB 1,500 mg 250 mL/hr   07/03/23 0436 New Bag/Given   ceFEPIme (MAXIPIME) 2 g in sodium chloride 0.9 % 100 mL IVPB 2 g 200 mL/hr       Antimicrobials this admission: Vancomycin 12/16>> Cefepime 12/16>>   Microbiology results: 12/16 Bcx: sent  Thank you for allowing pharmacy to be a part of this patient's care.  Jani Gravel, PharmD Clinical Pharmacist  07/03/2023 5:39 AM

## 2023-07-03 NOTE — H&P (Signed)
History and Physical    BARTO MURTAGH ZOX:[id number] DOB: [birth year] DOA: 07/02/2023  PCP: [name]  Patient coming from: Home  Chief Complaint: Right hip pain  HPI: Leroy Chen is a 67 y.o. male with medical history significant of paroxysmal A-fib status post DCCV in 2021 on Xarelto, history of first and second-degree AV block not requiring pacemaker placement, atrial tachycardia, insulin-dependent type 2 diabetes, CKD stage IIIa, hypertension, hyperlipidemia, former smoker, obesity, hidradenitis suppurativa, depression.  Patient was recently seen in the ED on 12/11 for fall and right leg injury.  X-ray of the femur showed no evidence of fracture.  His pain was felt to be related to right quadriceps tendon injury.  He was made nonweightbearing on crutches, knee immobilizer placed, and was discharged with plan for orthopedic follow-up.  Patient returns to the ED today via EMS complaining of right hip pain and swelling, redness, and warmth of the right leg above the knee.  Patient tachycardic to the 140s.  Afebrile and not hypotensive.  Not hypoxic.  Labs notable for WBC count 14.3, hemoglobin 11.1 (previously 16.5 in July 2024), MCV 95.3, sodium 134, bicarb 13, anion gap 21, glucose 234, BUN 46, creatinine 2.0 (baseline 1.3-1.5), T. bili 3.2, transaminases and alkaline phosphatase normal, CK normal, VBG with pH 7.34, COVID/influenza/RSV PCR negative, lactic acid 3.2, blood cultures collected.  Chest x-ray showing no active disease.  CT of right hip showed no acute fracture or dislocation.  Showing probable mild chronic right sacroiliitis.  CT of right femur showing subcutaneous edema throughout the mid and lower thigh surrounding the knee without evidence of soft tissue gas or fluid collection.  No acute osseous abnormality seen.  Showing a thin linear radiopaque foreign body measuring 1 cm within the lateral subcutaneous tissues of the distal right lower extremity.  No obvious  foreign body seen protruding from the skin on exam.  Patient was given Norco, losartan, morphine, fentanyl, vancomycin, cefepime, and 3.5 L normal saline.  TRH called to admit.  Patient states he slipped and fell a week ago and since then having pain in his right hip and upper leg/thigh which is swollen and warm to touch.  Denies head injury or loss of consciousness related to the fall.  Also endorsing dyspnea and lightheadedness with exertion for the past week.  Denies chest pain.  Denies history of blood clots.  He is compliant with his home Xarelto.  Denies fevers, nausea, vomiting, or abdominal pain.  Denies hematemesis, hematochezia, melena, or hematuria.  Review of Systems:  Review of Systems  All other systems reviewed and are negative.   Past Medical History:  Diagnosis Date   Atrial fibrillation [facility]) March 2015   Diabetes mellitus without complication (HCC)    NIDDM, type II   High cholesterol    Hydradenitis    Sebaceous cyst    hx of   Sigmoid diverticulitis    Wears glasses     Past Surgical History:  Procedure Laterality Date   COLECTOMY  10/15/14   hand sugery     ILEO LOOP COLOSTOMY CLOSURE N/A 04/27/2015   Procedure: DIAGNOSTIC LAPAROSCOPY,  EXPLORATORY LAPAROTOMY WITH RESECTION AND CLOSURE OF COLOSTOMY ;  Surgeon: [name], MD;  Location: [facility] OR;  Service: General;  Laterality: N/A;   LAPAROTOMY N/A 10/15/2014   Procedure: EXPLORATORY LAPAROTOMY WITH DRAINAGE OF INTRA- ABDOMINAL ABSCESS;  Surgeon: [name], MD;  Location: [facility] OR;  Service: General;  Laterality: N/A;   ORCHIECTOMY  PARTIAL COLECTOMY N/A 10/15/2014   Procedure: SIGMOID COLECTOMY WITH COLOSTOMY CREATION;  Surgeon: [name], MD;  Location: [facility] OR;  Service: General;  Laterality: N/A;   PILONIDAL CYST EXCISION N/A 01/01/2015   Procedure: EXCISION OF PILONIDAL DISEASE;  Surgeon: Romie Levee, MD;  Location: [facility];  Service: General;  Laterality: N/A;     reports  that he quit smoking about 6 years ago. His smoking use included cigarettes. He started smoking about 26 years ago. He has a 5 pack-year smoking history. He has never used smokeless tobacco. He reports current alcohol use. He reports that he does not use drugs.  Allergies  Allergen Reactions   Penicillins Swelling    Family History  Problem Relation Age of Onset   Healthy Mother     Prior to Admission medications   Medication Sig Start Date End Date Taking? Authorizing Provider  acetaminophen (TYLENOL) 500 MG tablet Take 1,000 mg by mouth 2 (two) times daily.    [provider]  Adalimumab 40 MG/0.4ML PNKT Inject 40 mg into the skin once a week. Mondays    [provider]  albuterol (PROVENTIL HFA;VENTOLIN HFA) 108 (90 Base) MCG/ACT inhaler Inhale 1-2 puffs into the lungs every 6 (six) hours as needed for wheezing or shortness of breath. 08/05/17   Wallis Bamberg, PA-C  Chlorhexidine Gluconate 4 % SOLN Apply 1 application topically See admin instructions. Apply to armpits & Groin 3 times a week in the shower. Leave on for 1 minute and then rinse    [provider]  clindamycin (CLEOCIN T) 1 % lotion Apply 1 application topically 2 (two) times daily. Draining bumps 03/14/19   [provider]  clobetasol (TEMOVATE) 0.05 % external solution Apply 1 application topically 2 (two) times daily as needed (infection on scalp).    [provider]  dicyclomine (BENTYL) 10 MG capsule Take 10 mg by mouth 3 (three) times daily as needed for spasms.    [provider]  empagliflozin (JARDIANCE) 25 MG TABS tablet Take 12.5 mg by mouth daily.    [provider]  Ensure (ENSURE) Take 237 mLs by mouth daily.    [provider]  ergocalciferol (VITAMIN D2) 1.25 MG (50000 UT) capsule Take 50,000 Units by mouth once a week. Mondays    [provider]  flecainide (TAMBOCOR) 50 MG tablet Take 50 mg by mouth 2 (two) times daily.    [provider]  Fluocinolone Acetonide 0.01 % OIL Place 1 application in ear(s) 2 (two) times daily as needed (scaling).    [provider]  HYDROcodone-acetaminophen (NORCO/VICODIN) 5-325 MG tablet Take 1 tablet by mouth every 6 (six) hours as needed for severe pain.    [provider]  HYDROcodone-acetaminophen (NORCO/VICODIN) 5-325 MG tablet Take 1 tablet by mouth every 6 (six) hours as needed. 06/27/23   Schutt, Edsel Petrin, PA-C  insulin glargine (LANTUS) 100 UNIT/ML Solostar Pen Inject 55 Units into the skin every evening. 04/03/19   [provider]  losartan (COZAAR) 50 MG tablet Take 50 mg by mouth daily.    [provider]  lurasidone (LATUDA) 40 MG TABS tablet Take 40 mg by mouth daily.    [provider]  melatonin 3 MG TABS tablet Take 3 mg by mouth at bedtime.    [provider]  metoprolol tartrate (LOPRESSOR) 100 MG tablet Take 1 tablet (100 mg total) by mouth 2 (two) times daily. 04/22/21 05/22/21  Lorin Glass, MD  Multiple Vitamin (MULTIVITAMIN) tablet Take 1 tablet by mouth daily.    [provider]  omeprazole (PRILOSEC) 20 MG capsule Take 20 mg by mouth 2 (two) times daily as needed (heartburn).    [provider]  rivaroxaban (XARELTO) 20 MG TABS tablet Take 20 mg by mouth daily.    [provider]  rosuvastatin (CRESTOR) 40 MG tablet Take 40 mg by mouth at bedtime.    [provider]  Semaglutide (OZEMPIC, 1 MG/DOSE, [place]) Inject 1 mg into the skin once a week. Mondays    [provider]  sertraline (ZOLOFT) 100 MG tablet Take 100 mg by mouth daily.    [provider]  Testosterone 1.62 % GEL Place 4 Pump onto the skin daily. Apply to upper arm or shoulder areas only    [provider]    Physical Exam: Vitals:   07/03/23 0026 07/03/23 0027 07/03/23 0028 07/03/23 0100  BP:      Pulse: (!) 122 (!) 125 (!) 125 (!) 147  Resp:    18  Temp:      TempSrc:       SpO2: 100% 100% 100% 100%  Weight:      Height:        Physical Exam Vitals reviewed.  Constitutional:      General: He is not in acute distress. HENT:     Head: Normocephalic and atraumatic.  Eyes:     Extraocular Movements: Extraocular movements intact.  Cardiovascular:     Rate and Rhythm: Regular rhythm. Tachycardia present.     Pulses: Normal pulses.  Pulmonary:     Effort: Pulmonary effort is normal. No respiratory distress.     Breath sounds: Normal breath sounds. No wheezing or rales.  Abdominal:     General: Bowel sounds are normal. There is no distension.     Palpations: Abdomen is soft.     Tenderness: There is no abdominal tenderness. There is no guarding.  Musculoskeletal:     Cervical back: Normal range of motion.     Right lower leg: Edema present.     Comments: Right lower extremity: Thigh swollen and warm to touch.  Dorsalis pedis pulse intact.  Skin:    General: Skin is warm and dry.  Neurological:     General: No focal deficit present.     Mental Status: He is alert and oriented to person, place, and time.     Labs on Admission: I have personally reviewed following labs and imaging studies  CBC: Recent Labs  Lab 07/02/23 1336 07/02/23 1349  WBC 14.3*  --   NEUTROABS 10.1*  --   HGB 11.1* 11.6*  HCT 34.7* 34.0*  MCV 95.3  --   PLT 262  --    Basic Metabolic Panel: Recent Labs  Lab 07/02/23 1336 07/02/23 1349  NA 134* 131*  K 4.9 4.7  CL 100  --   CO2 13*  --   GLUCOSE 234*  --   BUN 46*  --   CREATININE 2.01*  --   CALCIUM 9.4  --    GFR: Estimated Creatinine Clearance: 47.7 mL/min (A) (by C-G formula based on SCr of 2.01 mg/dL (H)). Liver Function Tests: Recent Labs  Lab 07/02/23 1336  AST 22  ALT 16  ALKPHOS 50  BILITOT 3.2*  PROT 7.2  ALBUMIN 3.3*   No results for input(s): "LIPASE", "AMYLASE" in the last 168 hours. No results for input(s): "AMMONIA" in the last 168 hours.  Coagulation Profile: No results for  input(s): "INR", "PROTIME" in the last 168 hours. Cardiac Enzymes: Recent Labs  Lab 07/02/23 1336  CKTOTAL 330   BNP (last 3 results) No results for input(s): "PROBNP" in the last 8760 hours. HbA1C: No results for input(s): "HGBA1C" in the last 72 hours. CBG: No results for input(s): "GLUCAP" in the last 168 hours. Lipid Profile: No results for input(s): "CHOL", "HDL", "LDLCALC", "TRIG", "CHOLHDL", "LDLDIRECT" in the last 72 hours. Thyroid Function Tests: No results for input(s): "TSH", "T4TOTAL", "FREET4", "T3FREE", "THYROIDAB" in the last 72 hours. Anemia Panel: No results for input(s): "VITAMINB12", "FOLATE", "FERRITIN", "TIBC", "IRON", "RETICCTPCT" in the last 72 hours. Urine analysis:    Component Value Date/Time   COLORURINE YELLOW 11/10/2020 1253   APPEARANCEUR HAZY (A) 11/10/2020 1253   LABSPEC 1.025 11/10/2020 1253   PHURINE 5.0 11/10/2020 1253   GLUCOSEU >=500 (A) 11/10/2020 1253   HGBUR LARGE (A) 11/10/2020 1253   BILIRUBINUR NEGATIVE 11/10/2020 1253   KETONESUR 20 (A) 11/10/2020 1253   PROTEINUR 100 (A) 11/10/2020 1253   UROBILINOGEN 0.2 04/20/2015 1628   NITRITE NEGATIVE 11/10/2020 1253   LEUKOCYTESUR NEGATIVE 11/10/2020 1253    Radiological Exams on Admission: CT FEMUR RIGHT W CONTRAST Result Date: 07/03/2023 CLINICAL DATA:  Soft tissue infection suspected. EXAM: CT OF THE LOWER RIGHT EXTREMITY WITH CONTRAST TECHNIQUE: Multidetector CT imaging of the lower right extremity was performed according to the standard protocol following intravenous contrast administration. RADIATION DOSE REDUCTION: This exam was performed according to the departmental dose-optimization program which includes automated exposure control, adjustment of the mA and/or kV according to patient size and/or use of iterative reconstruction technique. CONTRAST:  60mL OMNIPAQUE IOHEXOL 350 MG/ML SOLN COMPARISON:  Right femur x-ray 06/27/2023 FINDINGS: Bones/Joint/Cartilage There is no acute fracture  or dislocation. No cortical erosions are seen. There is no significant knee or hip joint effusion identified. There is a rounded sclerotic focus in the left femoral neck measuring 6 mm which may represent a bone island. Ligaments Suboptimally assessed by CT. Muscles and Tendons There is no intramuscular fluid collection. Deep fascial planes are preserved. There is an intramuscular lipoma in the adductor musculature measuring 3.0 x 1.6 x 2.6 cm. Soft tissues There is subcutaneous edema throughout the mid and lower thigh surrounding the knee. There is no evidence for soft tissue gas or definitive fluid collection. There is a thin linear radiopaque foreign body measuring 1 cm within the lateral subcutaneous tissues of the distal right lower extremity best seen on image 3/203. IMPRESSION: 1. Subcutaneous edema throughout the mid and lower thigh surrounding the knee. No evidence for soft tissue gas or fluid collection. 2. No acute osseous abnormality. 3. Thin linear radiopaque foreign body measuring 1 cm within the lateral subcutaneous tissues of the distal right lower extremity. Electronically Signed   By: Darliss Cheney M.D.   On: 07/03/2023 00:19   CT HIP RIGHT WO CONTRAST Result Date: 07/02/2023 CLINICAL DATA:  Hip trauma, fracture suspected, xray done. EXAM: CT OF THE RIGHT HIP WITHOUT CONTRAST TECHNIQUE: Multidetector CT imaging of the right hip was performed according to the standard protocol. Multiplanar CT image reconstructions were also generated. RADIATION DOSE REDUCTION: This exam was performed according to the departmental dose-optimization program which includes automated exposure control, adjustment of the mA and/or kV according to patient size and/or use of iterative reconstruction technique. COMPARISON:  None Available. FINDINGS: Bones/Joint/Cartilage No acute fracture or dislocation. No aggressive osseous lesion. No significant degenerative changes of right hip joint. Probable  mild chronic right  sacroiliitis. Ligaments Suboptimally assessed by CT. Muscles and Tendons Suboptimally assessed by CT scan.  No gross abnormality seen. Soft tissues Unremarkable urinary bladder. No bowel obstruction. Normal appendix. Partially seen penile prosthesis. IMPRESSION: *No acute osseous injury of the right hip. *Multiple other nonacute observations, as described above. Electronically Signed   By: Jules Schick M.D.   On: 07/02/2023 17:30   DG Chest 1 View Result Date: 07/02/2023 CLINICAL DATA:  Cough.  Right leg redness EXAM: CHEST  1 VIEW COMPARISON:  02/12/2023 FINDINGS: AP upright radiograph demonstrates midline trachea. Normal heart size for level of inspiration. Mild left hemidiaphragm elevation. No pleural effusion or pneumothorax. Clear lungs. IMPRESSION: No active disease. Electronically Signed   By: Jeronimo Greaves M.D.   On: 07/02/2023 15:19    EKG: Independently reviewed.  Sinus tachycardia, nonspecific ST abnormalities.  Repeat EKG ordered and currently pending.  Assessment and Plan  Severe sepsis secondary to suspected right lower extremity cellulitis Meets criteria for severe sepsis with tachycardia, leukocytosis, and lactic acidosis.  CT of right femur showing subcutaneous edema throughout the mid and lower thigh surrounding the knee without evidence of soft tissue gas or fluid collection.  No acute osseous abnormality seen.  CT also showing a thin linear radiopaque foreign body measuring 1 cm within the lateral subcutaneous tissues of the distal right lower extremity.  On exam, no obvious foreign body seen protruding from the skin in this area.  I spoke to radiologist Dr. Vickii Chafe and was informed that foreign body seen on CT was seen on previous x-ray done on December 11 as well and there is no surrounding fluid collection or air seen.  Continue vancomycin and cefepime.  Patient was given 3.5 L IV fluids in the ED, heart rate now improved to 110-120s.  Not hypotensive.  Continue IV fluid hydration  and pain management.  Trend lactate, WBC count, and follow-up blood cultures.  AKI on CKD stage IIIa Likely prerenal in etiology in the setting of sepsis. BUN 46, creatinine 2.0 (baseline 1.3-1.5).  Continue IV fluid hydration and monitor renal function.  Avoid nephrotoxic agents/hold home losartan.  Sinus tachycardia Likely related to infection/sepsis and pain.  Continue management as mentioned above.  Also DVT is on the differential and right lower extremity Doppler ultrasound ordered.  ?PE but no hypoxia, dyspnea, or chest pain.  Patient is currently satting 100% on room air.  Continue Xarelto.  Check TSH.  Normocytic anemia Hemoglobin currently 11.1, was 16.5 in July 2024.  Patient is chronically anticoagulated although denies any symptoms of GI bleed.  No other obvious bleeding.  Anemia panel and FOBT ordered.  Mild hyponatremia Continue IV fluid hydration and monitor labs.  High anion gap metabolic acidosis Bicarb 14 and anion gap 21 on initial labs.  VBG showing normal pH.  Suspect metabolic acidosis is related to AKI on CKD.  DKA less likely as glucose in the 230s.  Continue IV fluid hydration and monitor metabolic panel.  Check beta hydroxybutyric acid level.  Elevated total bilirubin Likely due to sepsis. T. bili 3.2, transaminases and alkaline phosphatase normal.  No abdominal pain or tenderness.  Repeat LFTs ordered.  Paroxysmal A-fib status post DCCV in 2021 History of atrial tachycardia Current rhythm is sinus tachycardia.  Continue Xarelto, metoprolol, and flecainide.  Insulin-dependent type 2 diabetes Last A1c 7.2, repeat ordered.  Glucose in the 230s with metabolic acidosis in the setting of AKI on CKD.  DKA felt to be less likely, checking beta  hydroxybutyric acid level.  Placed on sensitive sliding scale insulin ACHS.  Continue home long-acting insulin.  Hypertension Blood pressure stable.  Continue metoprolol given tachycardia.  Hold losartan in the setting of  AKI.  Hyperlipidemia Continue Crestor.  Depression Continue Zoloft.  GERD Continue Protonix.  Code Status: Full Code (discussed with the patient) Level of care: Progressive Care Unit Admission status: It is my clinical opinion that admission to INPATIENT is reasonable and necessary because of the expectation that this patient will require hospital care that crosses at least 2 midnights to treat this condition based on the medical complexity of the problems presented.  Given the aforementioned information, the predictability of an adverse outcome is felt to be significant.\  John Giovanni MD [facility]  If 7PM-7AM, please contact night-coverage [email or web address]  07/03/2023, 1:33 AM

## 2023-07-03 NOTE — ED Notes (Signed)
ED TO INPATIENT HANDOFF REPORT  ED Nurse Name and Phone #: Fayrene Fearing [phone]  S Name/Age/Gender [name] 67 y.o. male Room/Bed: 004C/004C  Code Status   Code Status: Full Code  Home/SNF/Other Home Patient oriented to: self, place, time, and situation Is this baseline? No   Triage Complete: Triage complete  Chief Complaint Sepsis [facility]) [A41.9]  Triage Note Patient BIB [facility] from home for right leg pain redness, swelling, warmth above knee. Tingling, cool, and diminished pulses below the knee. Patient only missed eliquis this morning but has taken it as ordered previously. A&Ox4, difficult to bear weight b/c of pain.    Allergies Allergies  Allergen Reactions   Penicillins Swelling    Level of Care/Admitting Diagnosis ED Disposition     ED Disposition  Admit   Condition  --   Comment  Hospital Area: [facility] [[id number]]  Level of Care: Progressive [102]  Admit to Progressive based on following criteria: MULTISYSTEM THREATS such as stable sepsis, metabolic/electrolyte imbalance with or without encephalopathy that is responding to early treatment.  May [facility] patient to [facility] if equivalent level of care is available:: Yes  Covid Evaluation: Asymptomatic - no recent exposure (last 10 days) testing not required  Diagnosis: Sepsis [facility]) [[id number]]  Admitting Physician: John Giovanni [[id number]]  Attending Physician: John Giovanni [[id number]]  Certification:: I certify this patient will need inpatient services for at least 2 midnights  Expected Medical Readiness: 07/05/2023          B Medical/Surgery History Past Medical History:  Diagnosis Date   Atrial fibrillation [facility]) March 2015   Diabetes mellitus without complication (HCC)    NIDDM, type II   High cholesterol    Hydradenitis    Sebaceous cyst    hx of   Sigmoid diverticulitis    Wears glasses    Past Surgical History:  Procedure Laterality Date    COLECTOMY  10/15/14   hand sugery     ILEO LOOP COLOSTOMY CLOSURE N/A 04/27/2015   Procedure: DIAGNOSTIC LAPAROSCOPY,  EXPLORATORY LAPAROTOMY WITH RESECTION AND CLOSURE OF COLOSTOMY ;  Surgeon: [name], MD;  Location: [facility] OR;  Service: General;  Laterality: N/A;   LAPAROTOMY N/A 10/15/2014   Procedure: EXPLORATORY LAPAROTOMY WITH DRAINAGE OF INTRA- ABDOMINAL ABSCESS;  Surgeon: [name], MD;  Location: [facility] OR;  Service: General;  Laterality: N/A;   ORCHIECTOMY     PARTIAL COLECTOMY N/A 10/15/2014   Procedure: SIGMOID COLECTOMY WITH COLOSTOMY CREATION;  Surgeon: [name], MD;  Location: [facility] OR;  Service: General;  Laterality: N/A;   PILONIDAL CYST EXCISION N/A 01/01/2015   Procedure: EXCISION OF PILONIDAL DISEASE;  Surgeon: Romie Levee, MD;  Location: [facility] [place] [facility];  Service: General;  Laterality: N/A;     A IV Location/Drains/Wounds Patient Lines/Drains/Airways Status     Active Line/Drains/Airways     Name Placement date Placement time Site Days   Peripheral IV 07/02/23 20 G Anterior;Distal;Right;Upper Arm 07/02/23  1700  Arm  1   Wound / Incision (Open or Dehisced) 11/13/20 Incision - Dehisced Penis Medial;Mid steri strips over incision 11/13/20  0605  Penis  962   Wound / Incision (Open or Dehisced) 11/13/20 (MASD) Moisture Associated Skin Damage Abdomen Medial;Mid;Lower;Anterior Under abdominal folds 11/13/20  0605  Abdomen  962            Intake/Output Last 24 hours  Intake/Output Summary (Last 24 hours) at 07/03/2023 1305 Last data filed at 07/03/2023 0758 Gross per  24 hour  Intake --  Output 1115 ml  Net -1115 ml    Labs/Imaging Results for orders placed or performed during the hospital encounter of 07/02/23 (from the past 48 hours)  CBC with Differential     Status: Abnormal   Collection Time: 07/02/23  1:36 PM  Result Value Ref Range   WBC 14.3 (H) 4.0 - 10.5 K/uL   RBC 3.64 (L) 4.22 - 5.81 MIL/uL   Hemoglobin 11.1 (L) 13.0 - 17.0 g/dL    HCT 16.1 (L) 09.6 - 52.0 %   MCV 95.3 80.0 - 100.0 fL   MCH 30.5 26.0 - 34.0 pg   MCHC 32.0 30.0 - 36.0 g/dL   RDW 04.5 40.9 - 81.1 %   Platelets 262 150 - 400 K/uL   nRBC 0.6 (H) 0.0 - 0.2 %   Neutrophils Relative % 72 %   Neutro Abs 10.1 (H) 1.7 - 7.7 K/uL   Lymphocytes Relative 17 %   Lymphs Abs 2.5 0.7 - 4.0 K/uL   Monocytes Relative 10 %   Monocytes Absolute 1.5 (H) 0.1 - 1.0 K/uL   Eosinophils Relative 0 %   Eosinophils Absolute 0.0 0.0 - 0.5 K/uL   Basophils Relative 0 %   Basophils Absolute 0.1 0.0 - 0.1 K/uL   Immature Granulocytes 1 %   Abs Immature Granulocytes 0.10 (H) 0.00 - 0.07 K/uL    Comment: Performed at [facility], [address], [place], [place] [address]  Comprehensive metabolic panel     Status: Abnormal   Collection Time: 07/02/23  1:36 PM  Result Value Ref Range   Sodium 134 (L) 135 - 145 mmol/L   Potassium 4.9 3.5 - 5.1 mmol/L   Chloride 100 98 - 111 mmol/L   CO2 13 (L) 22 - 32 mmol/L   Glucose, Bld 234 (H) 70 - 99 mg/dL    Comment: Glucose reference range applies only to samples taken after fasting for at least 8 hours.   BUN 46 (H) 8 - 23 mg/dL   Creatinine, Ser 2.95 (H) 0.61 - 1.24 mg/dL   Calcium 9.4 8.9 - 62.1 mg/dL   Total Protein 7.2 6.5 - 8.1 g/dL   Albumin 3.3 (L) 3.5 - 5.0 g/dL   AST 22 15 - 41 U/L   ALT 16 0 - 44 U/L   Alkaline Phosphatase 50 38 - 126 U/L   Total Bilirubin 3.2 (H) <1.2 mg/dL   GFR, Estimated 36 (L) >60 mL/min    Comment: (NOTE) Calculated using the CKD-EPI Creatinine Equation (2021)    Anion gap 21 (H) 5 - 15    Comment: ELECTROLYTES REPEATED TO VERIFY Performed at [facility], [address] [place], [place] [address]   CK     Status: None   Collection Time: 07/02/23  1:36 PM  Result Value Ref Range   Total CK 330 49 - 397 U/L    Comment: Performed at [facility], [address] [place], [place] [address]  I-Stat venous blood gas, [facility], [facility], [facility])     Status: Abnormal   Collection  Time: 07/02/23  1:49 PM  Result Value Ref Range   pH, Ven 7.347 7.25 - 7.43   pCO2, Ven 22.1 (L) 44 - 60 mmHg   pO2, Ven 49 (H) 32 - 45 mmHg   Bicarbonate 12.1 (L) 20.0 - 28.0 mmol/L   TCO2 13 (L) 22 - 32 mmol/L   O2 Saturation 83 %  Acid-base deficit 12.0 (H) 0.0 - 2.0 mmol/L   Sodium 131 (L) 135 - 145 mmol/L   Potassium 4.7 3.5 - 5.1 mmol/L   Calcium, Ion 1.08 (L) 1.15 - 1.40 mmol/L   HCT 34.0 (L) 39.0 - 52.0 %   Hemoglobin 11.6 (L) 13.0 - 17.0 g/dL   Sample type VENOUS   Resp panel by RT-PCR (RSV, Flu A&B, Covid) Anterior Nasal Swab     Status: None   Collection Time: 07/02/23  4:59 PM   Specimen: Anterior Nasal Swab  Result Value Ref Range   SARS Coronavirus 2 by RT PCR NEGATIVE NEGATIVE   Influenza A by PCR NEGATIVE NEGATIVE   Influenza B by PCR NEGATIVE NEGATIVE    Comment: (NOTE) The Xpert Xpress SARS-CoV-2/FLU/RSV plus assay is intended as an aid in the diagnosis of influenza from Nasopharyngeal swab specimens and should not be used as a sole basis for treatment. Nasal washings and aspirates are unacceptable for Xpert Xpress SARS-CoV-2/FLU/RSV testing.  Fact Sheet for Patients: BloggerCourse.com  Fact Sheet for Healthcare Providers: SeriousBroker.it  This test is not yet approved or cleared by the [place] FDA and has been authorized for detection and/or diagnosis of SARS-CoV-2 by FDA under an Emergency Use Authorization (EUA). This EUA will remain in effect (meaning this test can be used) for the duration of the COVID-19 declaration under Section 564(b)(1) of the Act, 21 U.S.C. section 360bbb-3(b)(1), unless the authorization is terminated or revoked.     Resp Syncytial Virus by PCR NEGATIVE NEGATIVE    Comment: (NOTE) Fact Sheet for Patients: BloggerCourse.com  Fact Sheet for Healthcare Providers: SeriousBroker.it  This test is not yet approved or  cleared by the [place] FDA and has been authorized for detection and/or diagnosis of SARS-CoV-2 by FDA under an Emergency Use Authorization (EUA). This EUA will remain in effect (meaning this test can be used) for the duration of the COVID-19 declaration under Section 564(b)(1) of the Act, 21 U.S.C. section 360bbb-3(b)(1), unless the authorization is terminated or revoked.  Performed at [facility], [address], [place], [place] [address]   Blood culture (routine x 2)     Status: None (Preliminary result)   Collection Time: 07/02/23  8:24 PM   Specimen: BLOOD RIGHT HAND  Result Value Ref Range   Specimen Description BLOOD RIGHT HAND    Special Requests      BOTTLES DRAWN AEROBIC ONLY Blood Culture results may not be optimal due to an inadequate volume of blood received in culture bottles   Culture      NO GROWTH < 12 HOURS Performed at [facility], [address] [place], [place] [address]    Report Status PENDING   I-Stat CG4 Lactic Acid     Status: Abnormal   Collection Time: 07/02/23  8:32 PM  Result Value Ref Range   Lactic Acid, Venous 2.5 (HH) 0.5 - 1.9 mmol/L   Comment NOTIFIED PHYSICIAN   Blood culture (routine x 2)     Status: None (Preliminary result)   Collection Time: 07/02/23  8:35 PM   Specimen: BLOOD  Result Value Ref Range   Specimen Description BLOOD SITE NOT SPECIFIED    Special Requests      BOTTLES DRAWN AEROBIC ONLY Blood Culture results may not be optimal due to an inadequate volume of blood received in culture bottles   Culture      NO GROWTH < 12 HOURS Performed at [facility], [address], [place], [place] [address]    Report Status PENDING   I-Stat CG4 Lactic Acid     Status: Abnormal   Collection Time: 07/02/23 10:25 PM  Result Value Ref Range   Lactic Acid, Venous 3.2 (HH) 0.5 - 1.9 mmol/L   Comment NOTIFIED PHYSICIAN   Lactic acid, plasma     Status: None   Collection Time: 07/03/23  4:06 AM  Result Value  Ref Range   Lactic Acid, Venous 1.3 0.5 - 1.9 mmol/L    Comment: Performed at [facility], [address] [place], [place] [address]  HIV Antibody (routine testing w rflx)     Status: None   Collection Time: 07/03/23  4:06 AM  Result Value Ref Range   HIV Screen 4th Generation wRfx Non Reactive Non Reactive    Comment: Performed at [facility], [address], [place], [place] [address]  Comprehensive metabolic panel     Status: Abnormal   Collection Time: 07/03/23  4:06 AM  Result Value Ref Range   Sodium 138 135 - 145 mmol/L   Potassium 4.4 3.5 - 5.1 mmol/L   Chloride 108 98 - 111 mmol/L   CO2 15 (L) 22 - 32 mmol/L   Glucose, Bld 181 (H) 70 - 99 mg/dL    Comment: Glucose reference range applies only to samples taken after fasting for at least 8 hours.   BUN 36 (H) 8 - 23 mg/dL   Creatinine, Ser 6.29 (H) 0.61 - 1.24 mg/dL   Calcium 7.9 (L) 8.9 - 10.3 mg/dL   Total Protein 5.7 (L) 6.5 - 8.1 g/dL   Albumin 2.7 (L) 3.5 - 5.0 g/dL   AST 20 15 - 41 U/L   ALT 15 0 - 44 U/L   Alkaline Phosphatase 42 38 - 126 U/L   Total Bilirubin 2.8 (H) <1.2 mg/dL   GFR, Estimated 40 (L) >60 mL/min    Comment: (NOTE) Calculated using the CKD-EPI Creatinine Equation (2021)    Anion gap 15 5 - 15    Comment: Performed at [facility], [address] [place], [place] [address]  CBC     Status: Abnormal   Collection Time: 07/03/23  4:06 AM  Result Value Ref Range   WBC 9.7 4.0 - 10.5 K/uL   RBC 2.93 (L) 4.22 - 5.81 MIL/uL   Hemoglobin 9.0 (L) 13.0 - 17.0 g/dL   HCT 32.4 (L) 40.1 - 02.7 %   MCV 98.3 80.0 - 100.0 fL   MCH 30.7 26.0 - 34.0 pg   MCHC 31.3 30.0 - 36.0 g/dL   RDW 25.3 66.4 - 40.3 %   Platelets 203 150 - 400 K/uL   nRBC 0.5 (H) 0.0 - 0.2 %    Comment: Performed at [facility], [address] [place], [place] [address]  Vitamin B12     Status: None   Collection Time: 07/03/23  4:06 AM  Result Value Ref Range   Vitamin B-12 471 180 - 914 pg/mL    Comment:  (NOTE) This assay is not validated for testing neonatal or myeloproliferative syndrome specimens for Vitamin B12 levels. Performed at [facility], [address], [place], [place] [address]   Folate     Status: None   Collection Time: 07/03/23  4:06 AM  Result Value Ref Range   Folate 25.4 >5.9 ng/mL    Comment: RESULT CONFIRMED BY MANUAL DILUTION Performed at [facility], [address] [place], [place] [address]   Iron and TIBC     Status: Abnormal   Collection Time: 07/03/23  4:06 AM  Result Value Ref Range   Iron 61 45 - 182 ug/dL   TIBC 191 (L) 478 - 295 ug/dL   Saturation Ratios 27 17.9 - 39.5 %   UIBC 167 ug/dL    Comment: Performed at [facility], [address] [place], [place] [address]  Ferritin     Status: None   Collection Time: 07/03/23  4:06 AM  Result Value Ref Range   Ferritin 196 24 - 336 ng/mL    Comment: Performed at [facility], [address] [place], [place] [address]  Reticulocytes     Status: Abnormal   Collection Time: 07/03/23  4:06 AM  Result Value Ref Range   Retic Ct Pct 4.7 (H) 0.4 - 3.1 %   RBC. 2.89 (L) 4.22 - 5.81 MIL/uL   Retic Count, Absolute 134.4 19.0 - 186.0 K/uL   Immature Retic Fract 42.3 (H) 2.3 - 15.9 %    Comment: Performed at [facility], [address], [place], [place] [address]  Magnesium     Status: None   Collection Time: 07/03/23  4:06 AM  Result Value Ref Range   Magnesium 2.3 1.7 - 2.4 mg/dL    Comment: Performed at [facility], [address], [place], [place] [address]  Hemoglobin A1c     Status: Abnormal   Collection Time: 07/03/23  4:06 AM  Result Value Ref Range   Hgb A1c MFr Bld 7.6 (H) 4.8 - 5.6 %    Comment: (NOTE) Pre diabetes:          5.7%-6.4%  Diabetes:              >6.4%  Glycemic control for   <7.0% adults with diabetes    Mean Plasma Glucose 171.42 mg/dL    Comment: Performed at [facility], [address], [place], [place] [address]  TSH      Status: None   Collection Time: 07/03/23  4:06 AM  Result Value Ref Range   TSH 1.210 0.350 - 4.500 uIU/mL    Comment: Performed by a 3rd Generation assay with a functional sensitivity of <=0.01 uIU/mL. Performed at [facility], [address] [place], [place] [address]   Beta-hydroxybutyric acid     Status: Abnormal   Collection Time: 07/03/23  4:06 AM  Result Value Ref Range   Beta-Hydroxybutyric Acid 5.59 (H) 0.05 - 0.27 mmol/L    Comment: RESULT CONFIRMED BY MANUAL DILUTION Performed at [facility], [address], [place], [place] [address]   CBG monitoring, ED     Status: Abnormal   Collection Time: 07/03/23  7:36 AM  Result Value Ref Range   Glucose-Capillary 154 (H) 70 - 99 mg/dL    Comment: Glucose reference range applies only to samples taken after fasting for at least 8 hours.  CBG monitoring, ED     Status: Abnormal   Collection Time: 07/03/23 12:05 PM  Result Value Ref Range   Glucose-Capillary 157 (H) 70 - 99 mg/dL    Comment: Glucose reference range applies only to samples taken after fasting for at least 8 hours.   Comment 1 Notify RN    Comment 2 Document in Chart    VAS [place] LOWER EXTREMITY VENOUS (DVT) Result Date: 07/03/2023  Lower Venous DVT Study Patient  Name:  POSIE JEFCOAT  Date of Exam:   07/03/2023 Medical Rec #: [id number]         Accession #:    [id number] Date of Birth: 11-17-1955          Patient Gender: M Patient Age:   89 years Exam Location:  [facility] Procedure:      VAS [place] LOWER EXTREMITY VENOUS (DVT) Referring Phys: Ulyess Blossom RATHORE --------------------------------------------------------------------------------  Indications: Pain, and Swelling. Other Indications: Recent fall w/ right leg injury on 06/27/2023. Anticoagulation: Xarelto (afib). Limitations: Poor ultrasound/tissue interface. Comparison Study: Previous exam on 06/02/2015 was negative for DVT. Performing Technologist: [name] RVT, RDMS  Examination Guidelines: A  complete evaluation includes B-mode imaging, spectral Doppler, color Doppler, and power Doppler as needed of all accessible portions of each vessel. Bilateral testing is considered an integral part of a complete examination. Limited examinations for reoccurring indications may be performed as noted. The reflux portion of the exam is performed with the patient in reverse Trendelenburg.  +---------+---------------+---------+-----------+----------+-------------------+ RIGHT    CompressibilityPhasicitySpontaneityPropertiesThrombus Aging      +---------+---------------+---------+-----------+----------+-------------------+ CFV      Full           Yes      Yes                                      +---------+---------------+---------+-----------+----------+-------------------+ SFJ      Full                                                             +---------+---------------+---------+-----------+----------+-------------------+ FV Prox  Full           Yes      Yes                                      +---------+---------------+---------+-----------+----------+-------------------+ FV Mid   Full           Yes      Yes                                      +---------+---------------+---------+-----------+----------+-------------------+ FV DistalFull           Yes      Yes                                      +---------+---------------+---------+-----------+----------+-------------------+ PFV      Full                                                             +---------+---------------+---------+-----------+----------+-------------------+ POP      Full           Yes      Yes                                      +---------+---------------+---------+-----------+----------+-------------------+  PTV      Full                                                             +---------+---------------+---------+-----------+----------+-------------------+ PERO     Full                                          Not well visualized +---------+---------------+---------+-----------+----------+-------------------+   +----+---------------+---------+-----------+----------+--------------+ LEFTCompressibilityPhasicitySpontaneityPropertiesThrombus Aging +----+---------------+---------+-----------+----------+--------------+ CFV Full           Yes      Yes                                 +----+---------------+---------+-----------+----------+--------------+     Summary: RIGHT: - There is no evidence of deep vein thrombosis in the lower extremity.  - No cystic structure found in the popliteal fossa. - Ultrasound characteristics of enlarged lymph nodes are noted in the groin. Subcutaneous edema noted throughout lower extremity.  LEFT: - No evidence of common femoral vein obstruction.   *See table(s) above for measurements and observations.    Preliminary    CT FEMUR RIGHT W CONTRAST Result Date: 07/03/2023 CLINICAL DATA:  Soft tissue infection suspected. EXAM: CT OF THE LOWER RIGHT EXTREMITY WITH CONTRAST TECHNIQUE: Multidetector CT imaging of the lower right extremity was performed according to the standard protocol following intravenous contrast administration. RADIATION DOSE REDUCTION: This exam was performed according to the departmental dose-optimization program which includes automated exposure control, adjustment of the mA and/or kV according to patient size and/or use of iterative reconstruction technique. CONTRAST:  60mL OMNIPAQUE IOHEXOL 350 MG/ML SOLN COMPARISON:  Right femur x-ray 06/27/2023 FINDINGS: Bones/Joint/Cartilage There is no acute fracture or dislocation. No cortical erosions are seen. There is no significant knee or hip joint effusion identified. There is a rounded sclerotic focus in the left femoral neck measuring 6 mm which may represent a bone island. Ligaments Suboptimally assessed by CT. Muscles and Tendons There is no intramuscular fluid collection.  Deep fascial planes are preserved. There is an intramuscular lipoma in the adductor musculature measuring 3.0 x 1.6 x 2.6 cm. Soft tissues There is subcutaneous edema throughout the mid and lower thigh surrounding the knee. There is no evidence for soft tissue gas or definitive fluid collection. There is a thin linear radiopaque foreign body measuring 1 cm within the lateral subcutaneous tissues of the distal right lower extremity best seen on image 3/203. IMPRESSION: 1. Subcutaneous edema throughout the mid and lower thigh surrounding the knee. No evidence for soft tissue gas or fluid collection. 2. No acute osseous abnormality. 3. Thin linear radiopaque foreign body measuring 1 cm within the lateral subcutaneous tissues of the distal right lower extremity. Electronically Signed   By: Darliss Cheney M.D.   On: 07/03/2023 00:19   CT HIP RIGHT WO CONTRAST Result Date: 07/02/2023 CLINICAL DATA:  Hip trauma, fracture suspected, xray done. EXAM: CT OF THE RIGHT HIP WITHOUT CONTRAST TECHNIQUE: Multidetector CT imaging of the right hip was performed according to the standard protocol. Multiplanar CT image reconstructions were also generated. RADIATION DOSE REDUCTION: This exam was performed according to the departmental dose-optimization program which  includes automated exposure control, adjustment of the mA and/or kV according to patient size and/or use of iterative reconstruction technique. COMPARISON:  None Available. FINDINGS: Bones/Joint/Cartilage No acute fracture or dislocation. No aggressive osseous lesion. No significant degenerative changes of right hip joint. Probable mild chronic right sacroiliitis. Ligaments Suboptimally assessed by CT. Muscles and Tendons Suboptimally assessed by CT scan.  No gross abnormality seen. Soft tissues Unremarkable urinary bladder. No bowel obstruction. Normal appendix. Partially seen penile prosthesis. IMPRESSION: *No acute osseous injury of the right hip. *Multiple other  nonacute observations, as described above. Electronically Signed   By: Jules Schick M.D.   On: 07/02/2023 17:30   DG Chest 1 View Result Date: 07/02/2023 CLINICAL DATA:  Cough.  Right leg redness EXAM: CHEST  1 VIEW COMPARISON:  02/12/2023 FINDINGS: AP upright radiograph demonstrates midline trachea. Normal heart size for level of inspiration. Mild left hemidiaphragm elevation. No pleural effusion or pneumothorax. Clear lungs. IMPRESSION: No active disease. Electronically Signed   By: Jeronimo Greaves M.D.   On: 07/02/2023 15:19    Pending Labs Unresulted Labs (From admission, onward)     Start     Ordered   07/03/23 0309  Occult blood card to lab, stool  Once,   R        07/03/23 0315            Vitals/Pain Today's Vitals   07/03/23 0700 07/03/23 0755 07/03/23 0913 07/03/23 1206  BP: (!) 100/48  (!) 132/57 (!) 108/49  Pulse: 78  76 71  Resp: (!) 24  19 17   Temp:  97.9 F (36.6 C)  98.2 F (36.8 C)  TempSrc:  Oral    SpO2: 100%  100% 100%  Weight:      Height:      PainSc:        Isolation Precautions No active isolations  Medications Medications  morphine (PF) 2 MG/ML injection 1 mg (has no administration in time range)  oxyCODONE (Oxy IR/ROXICODONE) immediate release tablet 5 mg (5 mg Oral Given 07/03/23 0437)  naloxone (NARCAN) injection 0.4 mg (has no administration in time range)  0.9 %  sodium chloride infusion ( Intravenous New Bag/Given 07/03/23 1239)  acetaminophen (TYLENOL) tablet 650 mg (has no administration in time range)    Or  acetaminophen (TYLENOL) suppository 650 mg (has no administration in time range)  insulin aspart (novoLOG) injection 0-9 Units (2 Units Subcutaneous Given 07/03/23 1240)  insulin aspart (novoLOG) injection 0-5 Units (has no administration in time range)  flecainide (TAMBOCOR) tablet 50 mg (50 mg Oral Given 07/03/23 0919)  metoprolol tartrate (LOPRESSOR) tablet 50 mg (50 mg Oral Given 07/03/23 0913)  rosuvastatin (CRESTOR) tablet  40 mg (40 mg Oral Given 07/03/23 0914)  sertraline (ZOLOFT) tablet 100 mg (100 mg Oral Given 07/03/23 0919)  insulin glargine-yfgn (SEMGLEE) injection 58 Units (58 Units Subcutaneous Given 07/03/23 0913)  pantoprazole (PROTONIX) EC tablet 40 mg (40 mg Oral Given 07/03/23 0913)  rivaroxaban (XARELTO) tablet 20 mg (has no administration in time range)  ceFEPIme (MAXIPIME) 2 g in sodium chloride 0.9 % 100 mL IVPB (0 g Intravenous Stopped 07/03/23 0506)  vancomycin (VANCOREADY) IVPB 1250 mg/250 mL (has no administration in time range)  HYDROcodone-acetaminophen (NORCO/VICODIN) 5-325 MG per tablet 1 tablet (1 tablet Oral Given 07/02/23 1346)  losartan (COZAAR) tablet 50 mg (50 mg Oral Given 07/02/23 1700)  sodium chloride 0.9 % bolus 1,000 mL (0 mLs Intravenous Stopped 07/02/23 2011)  sodium chloride 0.9 % bolus 1,000 mL (  0 mLs Intravenous Stopped 07/02/23 2011)  ceFEPIme (MAXIPIME) 2 g in sodium chloride 0.9 % 100 mL IVPB (0 g Intravenous Stopped 07/02/23 2145)  morphine (PF) 4 MG/ML injection 4 mg (4 mg Intravenous Given 07/02/23 2035)  vancomycin (VANCOCIN) IVPB 1000 mg/200 mL premix (0 mg Intravenous Stopped 07/02/23 2304)    Followed by  Vancomycin (VANCOCIN) 1,500 mg in sodium chloride 0.9 % 500 mL IVPB (0 mg Intravenous Stopped 07/03/23 0112)  iohexol (OMNIPAQUE) 350 MG/ML injection 60 mL (60 mLs Intravenous Contrast Given 07/02/23 2248)  sodium chloride 0.9 % bolus 1,000 mL (0 mLs Intravenous Stopped 07/03/23 0234)    And  sodium chloride 0.9 % bolus 500 mL (0 mLs Intravenous Stopped 07/03/23 0234)    Mobility walks with person assist     Focused Assessments Cardiac Assessment Handoff:  Cardiac Rhythm: Atrial fibrillation Lab Results  Component Value Date   CKTOTAL 330 07/02/2023   TROPONINI 0.18 (H) 10/17/2014   Lab Results  Component Value Date   DDIMER <0.27 02/12/2023   Does the Patient currently have chest pain? No    R Recommendations: See Admitting Provider  Note  Report given to:   Additional Notes: normally walks without assistance

## 2023-07-03 NOTE — ED Notes (Signed)
 Central telemetry notified for cardiac monitoring.

## 2023-07-04 ENCOUNTER — Inpatient Hospital Stay (HOSPITAL_COMMUNITY): Payer: No Typology Code available for payment source

## 2023-07-04 DIAGNOSIS — S76111A Strain of right quadriceps muscle, fascia and tendon, initial encounter: Secondary | ICD-10-CM | POA: Insufficient documentation

## 2023-07-04 DIAGNOSIS — S86811A Strain of other muscle(s) and tendon(s) at lower leg level, right leg, initial encounter: Secondary | ICD-10-CM | POA: Diagnosis not present

## 2023-07-04 DIAGNOSIS — L03115 Cellulitis of right lower limb: Secondary | ICD-10-CM | POA: Diagnosis not present

## 2023-07-04 LAB — CBC WITH DIFFERENTIAL/PLATELET
Abs Immature Granulocytes: 0.08 10*3/uL — ABNORMAL HIGH (ref 0.00–0.07)
Basophils Absolute: 0.1 10*3/uL (ref 0.0–0.1)
Basophils Relative: 1 %
Eosinophils Absolute: 0.2 10*3/uL (ref 0.0–0.5)
Eosinophils Relative: 2 %
HCT: 28.4 % — ABNORMAL LOW (ref 39.0–52.0)
Hemoglobin: 9 g/dL — ABNORMAL LOW (ref 13.0–17.0)
Immature Granulocytes: 1 %
Lymphocytes Relative: 18 %
Lymphs Abs: 1.4 10*3/uL (ref 0.7–4.0)
MCH: 30.7 pg (ref 26.0–34.0)
MCHC: 31.7 g/dL (ref 30.0–36.0)
MCV: 96.9 fL (ref 80.0–100.0)
Monocytes Absolute: 1 10*3/uL (ref 0.1–1.0)
Monocytes Relative: 13 %
Neutro Abs: 5.1 10*3/uL (ref 1.7–7.7)
Neutrophils Relative %: 65 %
Platelets: 214 10*3/uL (ref 150–400)
RBC: 2.93 MIL/uL — ABNORMAL LOW (ref 4.22–5.81)
RDW: 15.2 % (ref 11.5–15.5)
WBC: 7.8 10*3/uL (ref 4.0–10.5)
nRBC: 0.6 % — ABNORMAL HIGH (ref 0.0–0.2)

## 2023-07-04 LAB — GLUCOSE, CAPILLARY
Glucose-Capillary: 129 mg/dL — ABNORMAL HIGH (ref 70–99)
Glucose-Capillary: 116 mg/dL — ABNORMAL HIGH (ref 70–99)
Glucose-Capillary: 124 mg/dL — ABNORMAL HIGH (ref 70–99)
Glucose-Capillary: 125 mg/dL — ABNORMAL HIGH (ref 70–99)
Glucose-Capillary: 124 mg/dL — ABNORMAL HIGH (ref 70–99)
Glucose-Capillary: 165 mg/dL — ABNORMAL HIGH (ref 70–99)
Glucose-Capillary: 136 mg/dL — ABNORMAL HIGH (ref 70–99)
Glucose-Capillary: 99 mg/dL (ref 70–99)
Glucose-Capillary: 98 mg/dL (ref 70–99)

## 2023-07-04 LAB — BRAIN NATRIURETIC PEPTIDE: B Natriuretic Peptide: 353.6 pg/mL — ABNORMAL HIGH (ref 0.0–100.0)

## 2023-07-04 LAB — TYPE AND SCREEN
ABO/RH(D): A POS
Antibody Screen: NEGATIVE

## 2023-07-04 LAB — BASIC METABOLIC PANEL
Anion gap: 10 (ref 5–15)
BUN: 24 mg/dL — ABNORMAL HIGH (ref 8–23)
CO2: 19 mmol/L — ABNORMAL LOW (ref 22–32)
Calcium: 8 mg/dL — ABNORMAL LOW (ref 8.9–10.3)
Chloride: 108 mmol/L (ref 98–111)
Creatinine, Ser: 1.32 mg/dL — ABNORMAL HIGH (ref 0.61–1.24)
GFR, Estimated: 59 mL/min — ABNORMAL LOW (ref >60)
Glucose, Bld: 132 mg/dL — ABNORMAL HIGH (ref 70–99)
Potassium: 4.2 mmol/L (ref 3.5–5.1)
Sodium: 137 mmol/L (ref 135–145)

## 2023-07-04 LAB — PROCALCITONIN: Procalcitonin: 0.12 ng/mL

## 2023-07-04 LAB — MAGNESIUM: Magnesium: 2.3 mg/dL (ref 1.7–2.4)

## 2023-07-04 LAB — PROTIME-INR
INR: 1.5 — ABNORMAL HIGH (ref 0.8–1.2)
Prothrombin Time: 18.2 s — ABNORMAL HIGH (ref 11.4–15.2)

## 2023-07-04 LAB — MRSA NEXT GEN BY PCR, NASAL: MRSA by PCR Next Gen: NOT DETECTED

## 2023-07-04 LAB — C-REACTIVE PROTEIN: CRP: 7.4 mg/dL — ABNORMAL HIGH (ref <1.0)

## 2023-07-04 LAB — ABO/RH: ABO/RH(D): A POS

## 2023-07-04 MED ORDER — ALBUTEROL SULFATE (2.5 MG/3ML) 0.083% IN NEBU: 2.5000 mg | INHALATION_SOLUTION | Freq: Four times a day (QID) | RESPIRATORY_TRACT | Status: DC | PRN

## 2023-07-04 MED ORDER — INSULIN GLARGINE-YFGN 100 UNIT/ML ~~LOC~~ SOLN
25.0000 [IU] | Freq: Every day | SUBCUTANEOUS | Status: DC
  Administered 2023-07-04: 25 [IU] via SUBCUTANEOUS
  Filled 2023-07-04 (×2): qty 0.25

## 2023-07-04 MED ORDER — ALBUTEROL SULFATE HFA 108 (90 BASE) MCG/ACT IN AERS: 1.0000 | INHALATION_SPRAY | Freq: Four times a day (QID) | RESPIRATORY_TRACT | Status: DC | PRN

## 2023-07-04 MED ORDER — INSULIN REGULAR(HUMAN) IN NACL 100-0.9 UT/100ML-% IV SOLN
INTRAVENOUS | Status: DC
  Administered 2023-07-04: 1.7 [IU]/h via INTRAVENOUS
  Filled 2023-07-04: qty 100

## 2023-07-04 MED ORDER — DICYCLOMINE HCL 10 MG PO CAPS: 10.0000 mg | ORAL_CAPSULE | Freq: Three times a day (TID) | ORAL | Status: DC | PRN

## 2023-07-04 MED ORDER — DEXTROSE 5 % IV SOLN: INTRAVENOUS | Status: AC

## 2023-07-04 NOTE — Plan of Care (Signed)
  Problem: Coping: Goal: Ability to adjust to condition or change in health will improve Outcome: Progressing   Problem: Fluid Volume: Goal: Ability to maintain a balanced intake and output will improve Outcome: Progressing   Problem: Metabolic: Goal: Ability to maintain appropriate glucose levels will improve Outcome: Progressing   Problem: Tissue Perfusion: Goal: Adequacy of tissue perfusion will improve Outcome: Progressing   Problem: Clinical Measurements: Goal: Will remain free from infection Outcome: Progressing   Problem: Coping: Goal: Level of anxiety will decrease Outcome: Progressing   Problem: Elimination: Goal: Will not experience complications related to bowel motility Outcome: Progressing   Problem: Pain Management: Goal: General experience of comfort will improve Outcome: Progressing   Problem: Skin Integrity: Goal: Risk for impaired skin integrity will decrease Outcome: Progressing   Problem: Skin Integrity: Goal: Risk for impaired skin integrity will decrease Outcome: Progressing   Problem: Clinical Measurements: Goal: Ability to avoid or minimize complications of infection will improve Outcome: Progressing   Problem: Skin Integrity: Goal: Skin integrity will improve Outcome: Progressing

## 2023-07-04 NOTE — Progress Notes (Signed)
   07/04/23 1055  TOC Brief Assessment  Insurance and Status Reviewed ([facility])  Patient has primary care physician Yes ([facility], [facility])  Home environment has been reviewed from home with Spouse  Prior level of function: independent  Prior/Current Home Services No current home services  Social Drivers of Health Review SDOH reviewed no interventions necessary  Readmission risk has been reviewed Yes (23%)   TOC following for needs. Patient is a [place] patient

## 2023-07-04 NOTE — Consult Note (Signed)
ORTHOPAEDIC CONSULTATION  REQUESTING PHYSICIAN: Leroy Sea, MD  Chief Complaint: Right leg pain swelling and weakness.  HPI: Leroy Chen is a 67 y.o. male who presents with acute trauma right lower extremity.  Patient states he was going down a step his left leg slipped out from under him and he sustained a hyperflexion injury to the right knee and thigh with all of his body weight falling on the hyperflexed knee.  Past Medical History:  Diagnosis Date   Atrial fibrillation [facility]) March 2015   Diabetes mellitus without complication (HCC)    NIDDM, type II   High cholesterol    Hydradenitis    Sebaceous cyst    hx of   Sigmoid diverticulitis    Wears glasses    Past Surgical History:  Procedure Laterality Date   COLECTOMY  10/15/14   hand sugery     ILEO LOOP COLOSTOMY CLOSURE N/A 04/27/2015   Procedure: DIAGNOSTIC LAPAROSCOPY,  EXPLORATORY LAPAROTOMY WITH RESECTION AND CLOSURE OF COLOSTOMY ;  Surgeon: [name], MD;  Location: [facility] OR;  Service: General;  Laterality: N/A;   LAPAROTOMY N/A 10/15/2014   Procedure: EXPLORATORY LAPAROTOMY WITH DRAINAGE OF INTRA- ABDOMINAL ABSCESS;  Surgeon: [name], MD;  Location: [facility] OR;  Service: General;  Laterality: N/A;   ORCHIECTOMY     PARTIAL COLECTOMY N/A 10/15/2014   Procedure: SIGMOID COLECTOMY WITH COLOSTOMY CREATION;  Surgeon: [name], MD;  Location: [facility] OR;  Service: General;  Laterality: N/A;   PILONIDAL CYST EXCISION N/A 01/01/2015   Procedure: EXCISION OF PILONIDAL DISEASE;  Surgeon: Romie Levee, MD;  Location: [facility];  Service: General;  Laterality: N/A;   Social History   Socioeconomic History   Marital status: Married    Spouse name: Not on file   Number of children: Not on file   Years of education: Not on file   Highest education level: Not on file  Occupational History   Not on file  Tobacco Use   Smoking status: Former    Current packs/day: 0.00    Average packs/day:  0.3 packs/day for 20.0 years (5.0 ttl pk-yrs)    Types: Cigarettes    Start date: 5    Quit date: 2018    Years since quitting: 6.9   Smokeless tobacco: Never  Vaping Use   Vaping status: Never Used  Substance and Sexual Activity   Alcohol use: Yes    Comment: Social- moderate - one beer at the wedding     Drug use: No   Sexual activity: Yes    Birth control/protection: Condom  Other Topics Concern   Not on file  Social History Narrative   Not on file   Social Drivers of Health   Financial Resource Strain: Not on file  Food Insecurity: No Food Insecurity (07/03/2023)   Hunger Vital Sign    Worried About Running Out of Food in the Last Year: Never true    Ran Out of Food in the Last Year: Never true  Transportation Needs: No Transportation Needs (07/03/2023)   PRAPARE - Administrator, Civil Service (Medical): No    Lack of Transportation (Non-Medical): No  Physical Activity: Not on file  Stress: Not on file  Social Connections: Unknown (11/25/2021)   [facility]   Social Network    Social Network: Not on file   Family History  Problem Relation Age of Onset   Healthy Mother    -  negative except otherwise stated in the family history section Allergies  Allergen Reactions   Penicillins Swelling   Prior to Admission medications   Medication Sig Start Date End Date Taking? Authorizing Provider  Adalimumab 40 MG/0.4ML PNKT Inject 40 mg into the skin once a week. Mondays   Yes [provider]  albuterol (PROVENTIL HFA;VENTOLIN HFA) 108 (90 Base) MCG/ACT inhaler Inhale 1-2 puffs into the lungs every 6 (six) hours as needed for wheezing or shortness of breath. 08/05/17  Yes Wallis Bamberg, PA-C  Chlorhexidine Gluconate 4 % SOLN Apply 1 application topically See admin instructions. Apply to armpits & Groin 3 times a week in the shower. Leave on for 1 minute and then rinse   Yes [provider]  clindamycin (CLEOCIN T)  1 % lotion Apply 1 application topically 2 (two) times daily. Draining bumps 03/14/19  Yes [provider]  clobetasol (TEMOVATE) 0.05 % external solution Apply 1 application topically 2 (two) times daily as needed (infection on scalp).   Yes [provider]  dicyclomine (BENTYL) 10 MG capsule Take 10 mg by mouth 3 (three) times daily as needed for spasms.   Yes [provider]  empagliflozin (JARDIANCE) 25 MG TABS tablet Take 12.5 mg by mouth daily.   Yes [provider]  ergocalciferol (VITAMIN D2) 1.25 MG (50000 UT) capsule Take 50,000 Units by mouth once a week. Mondays   Yes [provider]  flecainide (TAMBOCOR) 50 MG tablet Take 50 mg by mouth 2 (two) times daily.   Yes [provider]  Fluocinolone Acetonide 0.01 % OIL Place 1 application in ear(s) 2 (two) times daily as needed (scaling).   Yes [provider]  HYDROcodone-acetaminophen (NORCO/VICODIN) 5-325 MG tablet Take 1 tablet by mouth every 6 (six) hours as needed. 06/27/23  Yes Schutt, Edsel Petrin, PA-C  insulin glargine (LANTUS) 100 UNIT/ML Solostar Pen Inject 58 Units into the skin every evening. 04/03/19  Yes [provider]  losartan (COZAAR) 100 MG tablet Take 50 mg by mouth daily. 10/28/22  Yes [provider]  melatonin 3 MG TABS tablet Take 3 mg by mouth at bedtime as needed.   Yes [provider]  metFORMIN (GLUCOPHAGE-XR) 500 MG 24 hr tablet Take 500 mg by mouth 2 (two) times daily with a meal. 01/01/23  Yes [provider]  metoprolol tartrate (LOPRESSOR) 50 MG tablet Take by mouth. 01/08/23  Yes [provider]  Multiple Vitamin (MULTIVITAMIN) tablet Take 1 tablet by mouth daily.   Yes [provider]  omeprazole (PRILOSEC) 20 MG capsule Take 20 mg by mouth 2 (two) times daily as needed (heartburn).   Yes [provider]  pantoprazole (PROTONIX) 40 MG tablet Take 40 mg by mouth daily. 01/08/23  Yes  [provider]  rivaroxaban (XARELTO) 20 MG TABS tablet Take 20 mg by mouth daily.   Yes [provider]  rosuvastatin (CRESTOR) 40 MG tablet Take 40 mg by mouth at bedtime.   Yes [provider]  Semaglutide, 2 MG/DOSE, 8 MG/3ML SOPN Inject 2 mg into the skin once a week. Inject on Friday 01/17/23  Yes [provider]  sertraline (ZOLOFT) 100 MG tablet Take 100 mg by mouth daily.   Yes [provider]  Testosterone 1.62 % GEL Place 3 Pump onto the skin daily. Apply to upper arm or shoulder areas only   Yes [provider]  Semaglutide (OZEMPIC, 1 MG/DOSE, [place]) Inject 1 mg into the skin once a week.  Mondays    [provider]   VAS [place] LOWER EXTREMITY VENOUS (DVT) Result Date: 07/03/2023  Lower Venous DVT Study Patient Name:  Leroy Chen  Date of Exam:   07/03/2023 Medical Rec #: [id number]         Accession #:    [id number] Date of Birth: [birth year]          Patient Gender: M Patient Age:   33 years Exam Location:  [facility] Procedure:      VAS [place] LOWER EXTREMITY VENOUS (DVT) Referring Phys: Ulyess Blossom RATHORE --------------------------------------------------------------------------------  Indications: Pain, and Swelling. Other Indications: Recent fall w/ right leg injury on 06/27/2023. Anticoagulation: Xarelto (afib). Limitations: Poor ultrasound/tissue interface. Comparison Study: Previous exam on 06/02/2015 was negative for DVT. Performing Technologist: [name] RVT, RDMS  Examination Guidelines: A complete evaluation includes B-mode imaging, spectral Doppler, color Doppler, and power Doppler as needed of all accessible portions of each vessel. Bilateral testing is considered an integral part of a complete examination. Limited examinations for reoccurring indications may be performed as noted. The reflux portion of the exam is performed with the patient in reverse Trendelenburg.   +---------+---------------+---------+-----------+----------+-------------------+ RIGHT    CompressibilityPhasicitySpontaneityPropertiesThrombus Aging      +---------+---------------+---------+-----------+----------+-------------------+ CFV      Full           Yes      Yes                                      +---------+---------------+---------+-----------+----------+-------------------+ SFJ      Full                                                             +---------+---------------+---------+-----------+----------+-------------------+ FV Prox  Full           Yes      Yes                                      +---------+---------------+---------+-----------+----------+-------------------+ FV Mid   Full           Yes      Yes                                      +---------+---------------+---------+-----------+----------+-------------------+ FV DistalFull           Yes      Yes                                      +---------+---------------+---------+-----------+----------+-------------------+ PFV      Full                                                             +---------+---------------+---------+-----------+----------+-------------------+ POP      Full  Yes      Yes                                      +---------+---------------+---------+-----------+----------+-------------------+ PTV      Full                                                             +---------+---------------+---------+-----------+----------+-------------------+ PERO     Full                                         Not well visualized +---------+---------------+---------+-----------+----------+-------------------+   +----+---------------+---------+-----------+----------+--------------+ LEFTCompressibilityPhasicitySpontaneityPropertiesThrombus Aging +----+---------------+---------+-----------+----------+--------------+ CFV Full           Yes       Yes                                 +----+---------------+---------+-----------+----------+--------------+     Summary: RIGHT: - There is no evidence of deep vein thrombosis in the lower extremity.  - No cystic structure found in the popliteal fossa. - Ultrasound characteristics of enlarged lymph nodes are noted in the groin. Subcutaneous edema noted throughout lower extremity.  LEFT: - No evidence of common femoral vein obstruction.   *See table(s) above for measurements and observations. Electronically signed by Lemar Livings MD on 07/03/2023 at 1:38:07 PM.    Final    CT FEMUR RIGHT W CONTRAST Result Date: 07/03/2023 CLINICAL DATA:  Soft tissue infection suspected. EXAM: CT OF THE LOWER RIGHT EXTREMITY WITH CONTRAST TECHNIQUE: Multidetector CT imaging of the lower right extremity was performed according to the standard protocol following intravenous contrast administration. RADIATION DOSE REDUCTION: This exam was performed according to the departmental dose-optimization program which includes automated exposure control, adjustment of the mA and/or kV according to patient size and/or use of iterative reconstruction technique. CONTRAST:  60mL OMNIPAQUE IOHEXOL 350 MG/ML SOLN COMPARISON:  Right femur x-ray 06/27/2023 FINDINGS: Bones/Joint/Cartilage There is no acute fracture or dislocation. No cortical erosions are seen. There is no significant knee or hip joint effusion identified. There is a rounded sclerotic focus in the left femoral neck measuring 6 mm which may represent a bone island. Ligaments Suboptimally assessed by CT. Muscles and Tendons There is no intramuscular fluid collection. Deep fascial planes are preserved. There is an intramuscular lipoma in the adductor musculature measuring 3.0 x 1.6 x 2.6 cm. Soft tissues There is subcutaneous edema throughout the mid and lower thigh surrounding the knee. There is no evidence for soft tissue gas or definitive fluid collection. There is a thin linear  radiopaque foreign body measuring 1 cm within the lateral subcutaneous tissues of the distal right lower extremity best seen on image 3/203. IMPRESSION: 1. Subcutaneous edema throughout the mid and lower thigh surrounding the knee. No evidence for soft tissue gas or fluid collection. 2. No acute osseous abnormality. 3. Thin linear radiopaque foreign body measuring 1 cm within the lateral subcutaneous tissues of the distal right lower extremity. Electronically Signed   By: Darliss Cheney M.D.   On: 07/03/2023 00:19   CT HIP RIGHT WO CONTRAST  Result Date: 07/02/2023 CLINICAL DATA:  Hip trauma, fracture suspected, xray done. EXAM: CT OF THE RIGHT HIP WITHOUT CONTRAST TECHNIQUE: Multidetector CT imaging of the right hip was performed according to the standard protocol. Multiplanar CT image reconstructions were also generated. RADIATION DOSE REDUCTION: This exam was performed according to the departmental dose-optimization program which includes automated exposure control, adjustment of the mA and/or kV according to patient size and/or use of iterative reconstruction technique. COMPARISON:  None Available. FINDINGS: Bones/Joint/Cartilage No acute fracture or dislocation. No aggressive osseous lesion. No significant degenerative changes of right hip joint. Probable mild chronic right sacroiliitis. Ligaments Suboptimally assessed by CT. Muscles and Tendons Suboptimally assessed by CT scan.  No gross abnormality seen. Soft tissues Unremarkable urinary bladder. No bowel obstruction. Normal appendix. Partially seen penile prosthesis. IMPRESSION: *No acute osseous injury of the right hip. *Multiple other nonacute observations, as described above. Electronically Signed   By: Jules Schick M.D.   On: 07/02/2023 17:30   - pertinent xrays, CT, MRI studies were reviewed and independently interpreted  Positive ROS: All other systems have been reviewed and were otherwise negative with the exception of those mentioned in the  HPI and as above.  Physical Exam: General: Alert, no acute distress Psychiatric: Patient is competent for consent with normal mood and affect Lymphatic: No axillary or cervical lymphadenopathy Cardiovascular: No pedal edema Respiratory: No cyanosis, no use of accessory musculature GI: No organomegaly, abdomen is soft and non-tender    Images:  @ENCIMAGES @  Labs:  Lab Results  Component Value Date   HGBA1C 7.6 (H) 07/03/2023   HGBA1C 7.2 (H) 04/21/2021   HGBA1C 7.3 (H) 11/13/2020   ESRSEDRATE 70 (H) 10/13/2014   CRP 7.4 (H) 07/04/2023   CRP 16.7 (H) 10/13/2014   REPTSTATUS PENDING 07/02/2023   GRAMSTAIN  11/07/2014    FEW WBC PRESENT, PREDOMINANTLY PMN NO SQUAMOUS EPITHELIAL CELLS SEEN NO ORGANISMS SEEN Performed at [facility]    CULT  07/02/2023    NO GROWTH 2 DAYS Performed at [facility], [address] [place], [place] [address]    [facility] ESCHERICHIA COLI 11/07/2014    Lab Results  Component Value Date   ALBUMIN 2.7 (L) 07/03/2023   ALBUMIN 3.3 (L) 07/02/2023   ALBUMIN 4.0 04/20/2021        Latest Ref Rng & Units 07/04/2023    8:34 AM 07/03/2023    4:06 AM 07/02/2023    1:49 PM  CBC EXTENDED  WBC 4.0 - 10.5 K/uL 7.8  9.7    RBC 4.22 - 5.81 MIL/uL 2.93  2.93    2.89    Hemoglobin 13.0 - 17.0 g/dL 9.0  9.0  10.2   HCT 72.5 - 52.0 % 28.4  28.8  34.0   Platelets 150 - 400 K/uL 214  203    NEUT# 1.7 - 7.7 K/uL 5.1     Lymph# 0.7 - 4.0 K/uL 1.4       Neurologic: Patient does not have protective sensation bilateral lower extremities.   MUSCULOSKELETAL:   Skin: Examination patient has ecchymosis bruising and swelling in the right thigh.  The thigh is not tense no signs of a compartment syndrome.  Patient has tenderness to palpation along the quad tendon and patella tendon and the patella tendon is not in continuity.  Patient cannot do a straight leg raise.  Review of the CT scan shows a disruption of the patella tendon and shows  inconsistent signal through the quad tendon.  Assessment:  Extensor tendon injury to the right knee with possible complete rupture of both the patella tendon and quad tendon.  Plan: I have ordered an MRI scan to further evaluate the extensor mechanism right knee.  Will plan for surgical intervention on Friday with surgical planning based on the MRI scan findings.  Thank you for the consult and the opportunity to see Mr. Treyvin Girten, MD [facility] [phone] 2:44 PM

## 2023-07-04 NOTE — Inpatient Diabetes Management (Signed)
Inpatient Diabetes Program Recommendations  AACE/ADA: New Consensus Statement on Inpatient Glycemic Control (2015)  Target Ranges:  Prepandial:   less than 140 mg/dL      Peak postprandial:   less than 180 mg/dL (1-2 hours)      Critically ill patients:  140 - 180 mg/dL   Lab Results  Component Value Date   GLUCAP 124 (H) 07/04/2023   HGBA1C 7.6 (H) 07/03/2023    Latest Reference Range & Units 07/03/23 07:36 07/03/23 12:05 07/03/23 15:17 07/03/23 21:43 07/04/23 08:11 07/04/23 09:41 07/04/23 10:20  Glucose-Capillary 70 - 99 mg/dL 914 (H) 782 (H) 956 (H) 155 (H) 129 (H) 116 (H) 124 (H)  (H): Data is abnormally high  Review of Glycemic Control  Diabetes history: DM2 Outpatient Diabetes medications: Lantus 58 units daily, Ozempic 2 mg weekly, Jardiance 12.5 mg daily, Metformin 500 mg bid Current orders for Inpatient glycemic control: Semglee 25 mg daily, IV insulin drip  Inpatient Diabetes Program Recommendations:   Noted treatment plan and will follow while in the hospital.  Thank you, Billy Fischer. Yohan Samons, RN, MSN, CDCES  Diabetes Coordinator Inpatient Glycemic Control Team Team Pager [phone] (8am-5pm) 07/04/2023 10:58 AM

## 2023-07-04 NOTE — Progress Notes (Signed)
PT Cancellation Note  Patient Details Name: Leroy Chen MRN: [id number] DOB: 10-May-1956   Cancelled Treatment:    Reason Eval/Treat Not Completed: Other (comment) (Per Dr. Lajoyce Corners note, plan for surgical intervention on Friday. Will hold therapy until after surgery.)   Gladys Damme 07/04/2023, 2:50 PM

## 2023-07-04 NOTE — Progress Notes (Signed)
PROGRESS NOTE                                                                                                                                                                                                             Patient Demographics:    Leroy Chen, is a 67 y.o. male, DOB - 09-04-1955, NGE:[id number]  Outpatient Primary MD for the patient is [name]    LOS - 1  Admit date - 07/02/2023    Chief Complaint  Patient presents with   Leg Pain       Brief Narrative (HPI from H&P)    67 y.o. male with medical history significant of paroxysmal A-fib status post DCCV in 2021 on Xarelto, history of first and second-degree AV block not requiring pacemaker placement, atrial tachycardia, insulin-dependent type 2 diabetes, CKD stage IIIa, hypertension, hyperlipidemia, former smoker, obesity, hidradenitis suppurativa, depression.  Patient was recently seen in the ED on 06/27/23 for fall and right leg injury.  X-ray of the femur showed no evidence of fracture.  His pain was felt to be related to right quadriceps tendon injury.  He was made nonweightbearing on crutches, knee immobilizer placed, and was discharged with plan for orthopedic follow-up.   Continue to have swelling of his right leg with right thigh pain and discomfort came to the ER where CT scan showed very large amount of fluid collection in his right lower extremity in the thigh area, he was admitted for further care.  Further workup suggest that he might have a very large hematoma in his right thigh due to being on Xarelto and having a soft tissue injury.   Subjective:    [name] today has, No headache, No chest pain, No abdominal pain - No Nausea, No new weakness tingling or numbness, no SOB,    Assessment  & Plan :    Right lower extremity soft tissue injury with fall 1 week ago with right lower extremity swelling and possible secondary  cellulitis and foreign body did on CT scan.  He was diagnosed with severe sepsis at the time of admission as well.  I think patient's main issue is right thigh soft tissue hematoma which he incurred due to his severe fall 1 week prior to admission in the setting of him being on Xarelto, CT scan has been noted.  He is already been placed on antibiotics for possible secondary cellulitis, at this time we will hold his Xarelto, elevate his right lower extremity and have orthopedics see him.  Monitor him closely for compartment syndrome.  Follow cultures.  Sepsis pathophysiology clinically has resolved.  No DVT in right lower extremity on ultrasound.  AKI on CKD stage IIIa Likely prerenal in etiology in the setting of sepsis. BUN 46, creatinine 2.0 (baseline 1.3-1.5).  Continue IV fluid hydration and monitor renal function.  Avoid nephrotoxic agents/hold home losartan.   Sinus tachycardia Due to #1 above supportive care and pain control..   Normocytic anemia Hemoglobin currently 11.1, was 16.5 in July 2024.  Patient is chronically anticoagulated although denies any symptoms of GI bleed.  Clear due to soft tissue hematoma in the right lower extremity.  Monitor.  Type screen also done.  No signs of GI bleed continue PPI.   Paroxysmal A-fib status post DCCV in 2021 History of atrial tachycardia Current rhythm is sinus tachycardia.  Continue  metoprolol, and flecainide.  Xarelto for #1 above.  Elevated total bilirubin Due to soft tissue hematoma and reabsorption of blood, monitor.   Hypertension Blood pressure stable.  Continue metoprolol given tachycardia.  Hold losartan in the setting of AKI.   Hyperlipidemia Continue Crestor.   Depression Continue Zoloft.   GERD Continue Protonix.  Mild hyponatremia Gentle hydration   High anion gap metabolic acidosis  he takes Jardiance hence and has developed relative euglycemic DKA, ++ high beta hydroxybutyric acid level.  Will have to put him on D5W  to keep his sugars high enough to allow low-dose insulin drip run for 12 hours.  Once bicarb has stabilized we will switch him back to sliding scale and long-acting insulin.    Insulin-dependent type 2 diabetes Continue present insulin regimen as dictated above.   Lab Results  Component Value Date   HGBA1C 7.6 (H) 07/03/2023   CBG (last 3)  Recent Labs    07/03/23 1205 07/03/23 1517 07/03/23 2143  GLUCAP 157* 199* 155*         Condition - Fair  Family Communication  : Wife [phone]  on 07/04/2023 at 7:53 AM  Code Status :  Full  Consults  :  Ortho  PUD Prophylaxis : PPI   Procedures  :     Lower extremity venous duplex.  No DVT.    CT right hip.  No fracture.    CT scan tibia-fibula right leg.   1. Subcutaneous edema throughout the mid and lower thigh surrounding the knee. No evidence for soft tissue gas or fluid collection. 2. No acute osseous abnormality. 3. Thin linear radiopaque foreign body measuring 1 cm within the lateral subcutaneous tissues of the distal right lower extremity.      Disposition Plan  :    Status is: Inpatient   DVT Prophylaxis  :    Place and maintain sequential compression device Start: 07/04/23 0718   Lab Results  Component Value Date   PLT 203 07/03/2023    Diet :  Diet Order             Diet heart healthy/carb modified Room service appropriate? Yes; Fluid consistency: Thin  Diet effective now                    Inpatient Medications  Scheduled Meds:  flecainide  50 mg Oral BID   insulin aspart  0-5 Units Subcutaneous QHS   insulin aspart  0-9 Units  Subcutaneous TID WC   insulin glargine-yfgn  58 Units Subcutaneous Daily   metoprolol tartrate  50 mg Oral BID   pantoprazole  40 mg Oral Daily   rosuvastatin  40 mg Oral Daily   sertraline  100 mg Oral Daily   Continuous Infusions:  ceFEPime (MAXIPIME) IV 2 g (07/04/23 0308)   vancomycin 1,250 mg (07/03/23 2152)   PRN Meds:.acetaminophen **OR**  acetaminophen, albuterol, dicyclomine, morphine injection, naLOXone (NARCAN)  injection, oxyCODONE    Objective:   Vitals:   07/03/23 2030 07/03/23 2339 07/04/23 0502 07/04/23 0503  BP: (!) 114/51 (!) 104/52 (!) 120/51   Pulse: 85 83 75   Resp: 20 (!) 22 20 (!) 23  Temp: 97.8 F (36.6 C) 98.2 F (36.8 C) 98.2 F (36.8 C) 98.2 F (36.8 C)  TempSrc: Oral Oral Oral   SpO2: 98% 99% 97%   Weight:      Height:        Wt Readings from Last 3 Encounters:  07/02/23 113.4 kg  06/27/23 113.4 kg  02/12/23 113 kg     Intake/Output Summary (Last 24 hours) at 07/04/2023 0749 Last data filed at 07/04/2023 0600 Gross per 24 hour  Intake 350 ml  Output 2415 ml  Net -2065 ml     Physical Exam  Awake Alert, No new F.N deficits, Normal affect [place].AT,PERRAL Supple Neck, No JVD,   Symmetrical Chest wall movement, Good air movement bilaterally, CTAB RRR,No Gallops,Rubs or new Murmurs,  +ve B.Sounds, Abd Soft, No tenderness,   Right thigh up to the knee much more swollen as compared to the left,    Data Review:    Recent Labs  Lab 07/02/23 1336 07/02/23 1349 07/03/23 0406  WBC 14.3*  --  9.7  HGB 11.1* 11.6* 9.0*  HCT 34.7* 34.0* 28.8*  PLT 262  --  203  MCV 95.3  --  98.3  MCH 30.5  --  30.7  MCHC 32.0  --  31.3  RDW 14.5  --  14.8  LYMPHSABS 2.5  --   --   MONOABS 1.5*  --   --   EOSABS 0.0  --   --   BASOSABS 0.1  --   --     Recent Labs  Lab 07/02/23 1336 07/02/23 1349 07/02/23 2032 07/02/23 2225 07/03/23 0406  NA 134* 131*  --   --  138  K 4.9 4.7  --   --  4.4  CL 100  --   --   --  108  CO2 13*  --   --   --  15*  ANIONGAP 21*  --   --   --  15  GLUCOSE 234*  --   --   --  181*  BUN 46*  --   --   --  36*  CREATININE 2.01*  --   --   --  1.85*  AST 22  --   --   --  20  ALT 16  --   --   --  15  ALKPHOS 50  --   --   --  42  BILITOT 3.2*  --   --   --  2.8*  ALBUMIN 3.3*  --   --   --  2.7*  LATICACIDVEN  --   --  2.5* 3.2* 1.3  TSH  --   --   --    --  1.210  HGBA1C  --   --   --   --  7.6*  MG  --   --   --   --  2.3  CALCIUM 9.4  --   --   --  7.9*      Recent Labs  Lab 07/02/23 1336 07/02/23 2032 07/02/23 2225 07/03/23 0406  LATICACIDVEN  --  2.5* 3.2* 1.3  TSH  --   --   --  1.210  HGBA1C  --   --   --  7.6*  MG  --   --   --  2.3  CALCIUM 9.4  --   --  7.9*    --------------------------------------------------------------------------------------------------------------- No results found for: "CHOL", "HDL", "LDLCALC", "LDLDIRECT", "TRIG", "CHOLHDL"  Lab Results  Component Value Date   HGBA1C 7.6 (H) 07/03/2023   Recent Labs    07/03/23 0406  TSH 1.210   Recent Labs    07/03/23 0406  VITAMINB12 471  FOLATE 25.4  FERRITIN 196  TIBC 228*  IRON 61  RETICCTPCT 4.7*   ------------------------------------------------------------------------------------------------------------------ Cardiac Enzymes No results for input(s): "CKMB", "TROPONINI", "MYOGLOBIN" in the last 168 hours.  Invalid input(s): "CK"  Micro Results Recent Results (from the past 240 hours)  Resp panel by RT-PCR (RSV, Flu A&B, Covid) Anterior Nasal Swab     Status: None   Collection Time: 07/02/23  4:59 PM   Specimen: Anterior Nasal Swab  Result Value Ref Range Status   SARS Coronavirus 2 by RT PCR NEGATIVE NEGATIVE Final   Influenza A by PCR NEGATIVE NEGATIVE Final   Influenza B by PCR NEGATIVE NEGATIVE Final    Comment: (NOTE) The Xpert Xpress SARS-CoV-2/FLU/RSV plus assay is intended as an aid in the diagnosis of influenza from Nasopharyngeal swab specimens and should not be used as a sole basis for treatment. Nasal washings and aspirates are unacceptable for Xpert Xpress SARS-CoV-2/FLU/RSV testing.  Fact Sheet for Patients: BloggerCourse.com  Fact Sheet for Healthcare Providers: SeriousBroker.it  This test is not yet approved or cleared by the [place] FDA and has been  authorized for detection and/or diagnosis of SARS-CoV-2 by FDA under an Emergency Use Authorization (EUA). This EUA will remain in effect (meaning this test can be used) for the duration of the COVID-19 declaration under Section 564(b)(1) of the Act, 21 U.S.C. section 360bbb-3(b)(1), unless the authorization is terminated or revoked.     Resp Syncytial Virus by PCR NEGATIVE NEGATIVE Final    Comment: (NOTE) Fact Sheet for Patients: BloggerCourse.com  Fact Sheet for Healthcare Providers: SeriousBroker.it  This test is not yet approved or cleared by the [place] FDA and has been authorized for detection and/or diagnosis of SARS-CoV-2 by FDA under an Emergency Use Authorization (EUA). This EUA will remain in effect (meaning this test can be used) for the duration of the COVID-19 declaration under Section 564(b)(1) of the Act, 21 U.S.C. section 360bbb-3(b)(1), unless the authorization is terminated or revoked.  Performed at [facility], [address] [place], [place] [address]   Blood culture (routine x 2)     Status: None (Preliminary result)   Collection Time: 07/02/23  8:24 PM   Specimen: BLOOD RIGHT HAND  Result Value Ref Range Status   Specimen Description BLOOD RIGHT HAND  Final   Special Requests   Final    BOTTLES DRAWN AEROBIC ONLY Blood Culture results may not be optimal due to an inadequate volume of blood received in culture bottles   Culture   Final    NO GROWTH < 12 HOURS Performed at [facility],  [address] [place], [place] [address]    Report Status PENDING  Incomplete  Blood culture (routine x 2)     Status: None (Preliminary result)   Collection Time: 07/02/23  8:35 PM   Specimen: BLOOD  Result Value Ref Range Status   Specimen Description BLOOD SITE NOT SPECIFIED  Final   Special Requests   Final    BOTTLES DRAWN AEROBIC ONLY Blood Culture results may not be optimal due to an  inadequate volume of blood received in culture bottles   Culture   Final    NO GROWTH < 12 HOURS Performed at [facility], [address], [place], [place] [address]    Report Status PENDING  Incomplete    Radiology Reports VAS [place] LOWER EXTREMITY VENOUS (DVT) Result Date: 07/03/2023  Lower Venous DVT Study Patient Name:  PAPE YAHR  Date of Exam:   07/03/2023 Medical Rec #: [id number]         Accession #:    [id number] Date of Birth: 1955/11/29          Patient Gender: M Patient Age:   65 years Exam Location:  [facility] Procedure:      VAS [place] LOWER EXTREMITY VENOUS (DVT) Referring Phys: Ulyess Blossom RATHORE --------------------------------------------------------------------------------  Indications: Pain, and Swelling. Other Indications: Recent fall w/ right leg injury on 06/27/2023. Anticoagulation: Xarelto (afib). Limitations: Poor ultrasound/tissue interface. Comparison Study: Previous exam on 06/02/2015 was negative for DVT. Performing Technologist: [name] RVT, RDMS  Examination Guidelines: A complete evaluation includes B-mode imaging, spectral Doppler, color Doppler, and power Doppler as needed of all accessible portions of each vessel. Bilateral testing is considered an integral part of a complete examination. Limited examinations for reoccurring indications may be performed as noted. The reflux portion of the exam is performed with the patient in reverse Trendelenburg.  +---------+---------------+---------+-----------+----------+-------------------+ RIGHT    CompressibilityPhasicitySpontaneityPropertiesThrombus Aging      +---------+---------------+---------+-----------+----------+-------------------+ CFV      Full           Yes      Yes                                      +---------+---------------+---------+-----------+----------+-------------------+ SFJ      Full                                                              +---------+---------------+---------+-----------+----------+-------------------+ FV Prox  Full           Yes      Yes                                      +---------+---------------+---------+-----------+----------+-------------------+ FV Mid   Full           Yes      Yes                                      +---------+---------------+---------+-----------+----------+-------------------+ FV DistalFull           Yes  Yes                                      +---------+---------------+---------+-----------+----------+-------------------+ PFV      Full                                                             +---------+---------------+---------+-----------+----------+-------------------+ POP      Full           Yes      Yes                                      +---------+---------------+---------+-----------+----------+-------------------+ PTV      Full                                                             +---------+---------------+---------+-----------+----------+-------------------+ PERO     Full                                         Not well visualized +---------+---------------+---------+-----------+----------+-------------------+   +----+---------------+---------+-----------+----------+--------------+ LEFTCompressibilityPhasicitySpontaneityPropertiesThrombus Aging +----+---------------+---------+-----------+----------+--------------+ CFV Full           Yes      Yes                                 +----+---------------+---------+-----------+----------+--------------+     Summary: RIGHT: - There is no evidence of deep vein thrombosis in the lower extremity.  - No cystic structure found in the popliteal fossa. - Ultrasound characteristics of enlarged lymph nodes are noted in the groin. Subcutaneous edema noted throughout lower extremity.  LEFT: - No evidence of common femoral vein obstruction.   *See table(s) above for measurements and  observations. Electronically signed by Lemar Livings MD on 07/03/2023 at 1:38:07 PM.    Final    CT FEMUR RIGHT W CONTRAST Result Date: 07/03/2023 CLINICAL DATA:  Soft tissue infection suspected. EXAM: CT OF THE LOWER RIGHT EXTREMITY WITH CONTRAST TECHNIQUE: Multidetector CT imaging of the lower right extremity was performed according to the standard protocol following intravenous contrast administration. RADIATION DOSE REDUCTION: This exam was performed according to the departmental dose-optimization program which includes automated exposure control, adjustment of the mA and/or kV according to patient size and/or use of iterative reconstruction technique. CONTRAST:  60mL OMNIPAQUE IOHEXOL 350 MG/ML SOLN COMPARISON:  Right femur x-ray 06/27/2023 FINDINGS: Bones/Joint/Cartilage There is no acute fracture or dislocation. No cortical erosions are seen. There is no significant knee or hip joint effusion identified. There is a rounded sclerotic focus in the left femoral neck measuring 6 mm which may represent a bone island. Ligaments Suboptimally assessed by CT. Muscles and Tendons There is no intramuscular fluid collection. Deep fascial planes are preserved. There is an intramuscular lipoma in the adductor musculature measuring 3.0 x 1.6 x 2.6  cm. Soft tissues There is subcutaneous edema throughout the mid and lower thigh surrounding the knee. There is no evidence for soft tissue gas or definitive fluid collection. There is a thin linear radiopaque foreign body measuring 1 cm within the lateral subcutaneous tissues of the distal right lower extremity best seen on image 3/203. IMPRESSION: 1. Subcutaneous edema throughout the mid and lower thigh surrounding the knee. No evidence for soft tissue gas or fluid collection. 2. No acute osseous abnormality. 3. Thin linear radiopaque foreign body measuring 1 cm within the lateral subcutaneous tissues of the distal right lower extremity. Electronically Signed   By: Darliss Cheney M.D.   On: 07/03/2023 00:19   CT HIP RIGHT WO CONTRAST Result Date: 07/02/2023 CLINICAL DATA:  Hip trauma, fracture suspected, xray done. EXAM: CT OF THE RIGHT HIP WITHOUT CONTRAST TECHNIQUE: Multidetector CT imaging of the right hip was performed according to the standard protocol. Multiplanar CT image reconstructions were also generated. RADIATION DOSE REDUCTION: This exam was performed according to the departmental dose-optimization program which includes automated exposure control, adjustment of the mA and/or kV according to patient size and/or use of iterative reconstruction technique. COMPARISON:  None Available. FINDINGS: Bones/Joint/Cartilage No acute fracture or dislocation. No aggressive osseous lesion. No significant degenerative changes of right hip joint. Probable mild chronic right sacroiliitis. Ligaments Suboptimally assessed by CT. Muscles and Tendons Suboptimally assessed by CT scan.  No gross abnormality seen. Soft tissues Unremarkable urinary bladder. No bowel obstruction. Normal appendix. Partially seen penile prosthesis. IMPRESSION: *No acute osseous injury of the right hip. *Multiple other nonacute observations, as described above. Electronically Signed   By: Jules Schick M.D.   On: 07/02/2023 17:30   DG Chest 1 View Result Date: 07/02/2023 CLINICAL DATA:  Cough.  Right leg redness EXAM: CHEST  1 VIEW COMPARISON:  02/12/2023 FINDINGS: AP upright radiograph demonstrates midline trachea. Normal heart size for level of inspiration. Mild left hemidiaphragm elevation. No pleural effusion or pneumothorax. Clear lungs. IMPRESSION: No active disease. Electronically Signed   By: Jeronimo Greaves M.D.   On: 07/02/2023 15:19      Signature  -   Susa Raring M.D on 07/04/2023 at 7:49 AM   -  To page go to [email or web address]

## 2023-07-05 DIAGNOSIS — L03115 Cellulitis of right lower limb: Secondary | ICD-10-CM | POA: Diagnosis not present

## 2023-07-05 LAB — BRAIN NATRIURETIC PEPTIDE: B Natriuretic Peptide: 348.2 pg/mL — ABNORMAL HIGH (ref 0.0–100.0)

## 2023-07-05 LAB — CBC WITH DIFFERENTIAL/PLATELET
Abs Immature Granulocytes: 0.09 10*3/uL — ABNORMAL HIGH (ref 0.00–0.07)
Basophils Absolute: 0 10*3/uL (ref 0.0–0.1)
Basophils Relative: 1 %
Eosinophils Absolute: 0.1 10*3/uL (ref 0.0–0.5)
Eosinophils Relative: 2 %
HCT: 28.4 % — ABNORMAL LOW (ref 39.0–52.0)
Hemoglobin: 9 g/dL — ABNORMAL LOW (ref 13.0–17.0)
Immature Granulocytes: 1 %
Lymphocytes Relative: 18 %
Lymphs Abs: 1.5 10*3/uL (ref 0.7–4.0)
MCH: 30.7 pg (ref 26.0–34.0)
MCHC: 31.7 g/dL (ref 30.0–36.0)
MCV: 96.9 fL (ref 80.0–100.0)
Monocytes Absolute: 1.2 10*3/uL — ABNORMAL HIGH (ref 0.1–1.0)
Monocytes Relative: 14 %
Neutro Abs: 5.3 10*3/uL (ref 1.7–7.7)
Neutrophils Relative %: 64 %
Platelets: 238 10*3/uL (ref 150–400)
RBC: 2.93 MIL/uL — ABNORMAL LOW (ref 4.22–5.81)
RDW: 15 % (ref 11.5–15.5)
WBC: 8.3 10*3/uL (ref 4.0–10.5)
nRBC: 1.2 % — ABNORMAL HIGH (ref 0.0–0.2)

## 2023-07-05 LAB — BASIC METABOLIC PANEL
Anion gap: 7 (ref 5–15)
BUN: 17 mg/dL (ref 8–23)
CO2: 20 mmol/L — ABNORMAL LOW (ref 22–32)
Calcium: 7.9 mg/dL — ABNORMAL LOW (ref 8.9–10.3)
Chloride: 110 mmol/L (ref 98–111)
Creatinine, Ser: 1.22 mg/dL (ref 0.61–1.24)
GFR, Estimated: >60 mL/min (ref >60)
Glucose, Bld: 118 mg/dL — ABNORMAL HIGH (ref 70–99)
Potassium: 4.4 mmol/L (ref 3.5–5.1)
Sodium: 137 mmol/L (ref 135–145)

## 2023-07-05 LAB — PHOSPHORUS: Phosphorus: 2.1 mg/dL — ABNORMAL LOW (ref 2.5–4.6)

## 2023-07-05 LAB — MAGNESIUM: Magnesium: 2.3 mg/dL (ref 1.7–2.4)

## 2023-07-05 LAB — C-REACTIVE PROTEIN: CRP: 9.5 mg/dL — ABNORMAL HIGH (ref <1.0)

## 2023-07-05 LAB — GLUCOSE, CAPILLARY
Glucose-Capillary: 113 mg/dL — ABNORMAL HIGH (ref 70–99)
Glucose-Capillary: 162 mg/dL — ABNORMAL HIGH (ref 70–99)
Glucose-Capillary: 186 mg/dL — ABNORMAL HIGH (ref 70–99)
Glucose-Capillary: 159 mg/dL — ABNORMAL HIGH (ref 70–99)

## 2023-07-05 LAB — PROCALCITONIN: Procalcitonin: 0.11 ng/mL

## 2023-07-05 MED ORDER — INSULIN GLARGINE-YFGN 100 UNIT/ML ~~LOC~~ SOLN
40.0000 [IU] | Freq: Every day | SUBCUTANEOUS | Status: DC
  Filled 2023-07-05: qty 0.4

## 2023-07-05 MED ORDER — INSULIN ASPART 100 UNIT/ML IJ SOLN: 0.0000 [IU] | Freq: Every day | INTRAMUSCULAR | Status: DC

## 2023-07-05 MED ORDER — INSULIN ASPART 100 UNIT/ML IJ SOLN
0.0000 [IU] | Freq: Three times a day (TID) | INTRAMUSCULAR | Status: DC
  Administered 2023-07-05 – 2023-07-07 (×3): 3 [IU] via SUBCUTANEOUS
  Administered 2023-07-08: 2 [IU] via SUBCUTANEOUS
  Administered 2023-07-08: 5 [IU] via SUBCUTANEOUS
  Administered 2023-07-08 – 2023-07-09 (×3): 3 [IU] via SUBCUTANEOUS
  Administered 2023-07-10: 5 [IU] via SUBCUTANEOUS

## 2023-07-05 MED ORDER — SODIUM PHOSPHATES 45 MMOLE/15ML IV SOLN
30.0000 mmol | Freq: Once | INTRAVENOUS | Status: AC
  Administered 2023-07-05: 30 mmol via INTRAVENOUS
  Filled 2023-07-05: qty 10

## 2023-07-05 MED ORDER — INSULIN GLARGINE-YFGN 100 UNIT/ML ~~LOC~~ SOLN
25.0000 [IU] | Freq: Two times a day (BID) | SUBCUTANEOUS | Status: DC
  Administered 2023-07-05 – 2023-07-10 (×10): 25 [IU] via SUBCUTANEOUS
  Filled 2023-07-05 (×12): qty 0.25

## 2023-07-05 NOTE — H&P (View-Only) (Signed)
 Patient ID: Leroy Chen, male   DOB: 07-31-1955, 67 y.o.   MRN: [id number] Patient is seen in follow-up for the right knee extensor mechanism injury.  Review of the MRI scan shows complete rupture of the quad tendon.  The patella tendon is intact and not under tension.  Patient may eat today and we will plan for surgery for reconstruction of the quad tendon tomorrow.

## 2023-07-05 NOTE — Progress Notes (Signed)
PROGRESS NOTE                                                                                                                                                                                                             Patient Demographics:    Leroy Chen, is a 67 y.o. male, DOB - 30-Apr-1956, MVH:[id number]  Outpatient Primary MD for the patient is [name]    LOS - 2  Admit date - 07/02/2023    Chief Complaint  Patient presents with   Leg Pain       Brief Narrative (HPI from H&P)    67 y.o. male with medical history significant of paroxysmal A-fib status post DCCV in 2021 on Xarelto, history of first and second-degree AV block not requiring pacemaker placement, atrial tachycardia, insulin-dependent type 2 diabetes, CKD stage IIIa, hypertension, hyperlipidemia, former smoker, obesity, hidradenitis suppurativa, depression.  Patient was recently seen in the ED on 06/27/23 for fall and right leg injury.  X-ray of the femur showed no evidence of fracture.  His pain was felt to be related to right quadriceps tendon injury.  He was made nonweightbearing on crutches, knee immobilizer placed, and was discharged with plan for orthopedic follow-up.   Continue to have swelling of his right leg with right thigh pain and discomfort came to the ER where CT scan showed very large amount of fluid collection in his right lower extremity in the thigh area, he was admitted for further care.  Further workup suggest that he might have a very large hematoma in his right thigh due to being on Xarelto and having a soft tissue injury.   Subjective:   Patient in bed, appears comfortable, denies any headache, no fever, no chest pain or pressure, no shortness of breath , no abdominal pain. No focal weakness.  He has right leg discomfort and pain   Assessment  & Plan :    Right lower extremity soft tissue injury with fall 1 week ago with  right lower extremity swelling and possible secondary cellulitis and foreign body did on CT scan.  He was diagnosed with severe sepsis at the time of admission as well.  I think patient's main issue is right thigh soft tissue hematoma which he incurred due to his severe fall 1 week prior to admission in the setting of him  being on Xarelto, CT scan has been noted.  He is already been placed on antibiotics for possible secondary cellulitis and sepsis this has been clinically ruled out.  Orthopedics was consulted, MRI of the right leg obtained with large quadriceps tear and patellar injury and soft tissue hematoma, case discussed with Dr. Lajoyce Corners on 07/05/2023, stop antibiotics as this is not sepsis or infection, scheduled for surgical evacuation of hematoma and tendon repair on 07/06/2023.   AKI on CKD stage IIIa - Likely prerenal in etiology in the setting of blood loss, with hydration AKI resolved.   Sinus tachycardia - Due to #1 above supportive care and pain control..   Normocytic anemia - Hemoglobin currently 11.1, was 16.5 in July 2024.  Patient is chronically anticoagulated although denies any symptoms of GI bleed.  Acute anemia is clearly due to soft tissue hematoma and blood loss in the right lower extremity.  Monitor.  Type screen also done.  No signs of GI bleed continue PPI.  Monitor CBC.   Paroxysmal A-fib status post DCCV in 2021 - History of atrial tachycardia - Current rhythm is sinus tachycardia.  Continue  metoprolol, and flecainide.  Xarelto for #1 above.  SCDs.  Elevated total bilirubin  Due to soft tissue hematoma and reabsorption of blood, monitor.   Hypertension  Blood pressure stable.  Continue metoprolol given tachycardia.  Hold losartan in the setting of AKI.   Hyperlipidemia  Continue Crestor.   Depression  Continue Zoloft.   GERD  Continue Protonix.  Mild hyponatremia  Gentle hydration   High anion gap metabolic acidosis  - he takes Jardiance and had developed  euglycemic DKA which has resolved after insulin drip, transition to subcu insulin and sliding scale, discontinue Jardiance permanently upon discharge.    Insulin-dependent type 2 diabetes - Continue subcu insulin, will be n.p.o. after midnight on 07/06/2023 monitor cautiously.   Lab Results  Component Value Date   HGBA1C 7.6 (H) 07/03/2023   CBG (last 3)  Recent Labs    07/04/23 1852 07/04/23 2107 07/05/23 0804  GLUCAP 99 98 113*         Condition - Fair  Family Communication  : Wife [phone]  on 07/04/2023 at 7:53 AM  Code Status :  Full  Consults  :  Ortho  PUD Prophylaxis : PPI   Procedures  :     MRI.  Right lower extremity.    1. High-grade partial tear of the distal quadriceps tendon, involving the superficial central fibers arising from the rectus femoris tendon as well as some of the medial fibers arising from the vastus medialis. The deep central and lateral fibers appear intact. 2. Large amount of heterogeneous soft tissue swelling anteriorly in the distal thigh, likely representing soft tissue hematoma. 3. Possible partial tear of the medial patellofemoral ligament at its patellar attachment. 4. The menisci, cruciate and collateral ligaments appear intact. 5. No evidence of acute fracture or osteonecrosis  Lower extremity venous duplex.  No DVT.    CT right hip.  No fracture.    CT scan tibia-fibula right leg.   1. Subcutaneous edema throughout the mid and lower thigh surrounding the knee. No evidence for soft tissue gas or fluid collection. 2. No acute osseous abnormality. 3. Thin linear radiopaque foreign body measuring 1 cm within the lateral subcutaneous tissues of the distal right lower extremity.      Disposition Plan  :    Status is: Inpatient   DVT Prophylaxis  :  SCDs Start: 07/04/23 0813 Place and maintain sequential compression device Start: 07/04/23 0718   Lab Results  Component Value Date   PLT 238 07/05/2023    Diet :  Diet  Order             Diet Carb Modified Fluid consistency: Thin; Room service appropriate? Yes  Diet effective now                    Inpatient Medications  Scheduled Meds:  flecainide  50 mg Oral BID   insulin aspart  0-15 Units Subcutaneous TID WC   insulin aspart  0-5 Units Subcutaneous QHS   insulin glargine-yfgn  25 Units Subcutaneous BID   metoprolol tartrate  50 mg Oral BID   pantoprazole  40 mg Oral Daily   rosuvastatin  40 mg Oral Daily   sertraline  100 mg Oral Daily   Continuous Infusions:  sodium PHOSPHATE IVPB (in mmol)     PRN Meds:.acetaminophen **OR** acetaminophen, albuterol, dicyclomine, morphine injection, naLOXone (NARCAN)  injection, oxyCODONE    Objective:   Vitals:   07/04/23 2119 07/05/23 0032 07/05/23 0400 07/05/23 0800  BP: (!) 147/93 116/68 (!) 143/61 (!) 146/65  Pulse: 84 86 75 84  Resp:  16 18 18   Temp:  98.5 F (36.9 C) 98.9 F (37.2 C) 98.3 F (36.8 C)  TempSrc:  Oral Oral Oral  SpO2:  97% 95% 97%  Weight:      Height:        Wt Readings from Last 3 Encounters:  07/02/23 113.4 kg  06/27/23 113.4 kg  02/12/23 113 kg     Intake/Output Summary (Last 24 hours) at 07/05/2023 0855 Last data filed at 07/05/2023 5956 Gross per 24 hour  Intake 30 ml  Output 2750 ml  Net -2720 ml     Physical Exam  Awake Alert, No new F.N deficits, Normal affect [place].AT,PERRAL Supple Neck, No JVD,   Symmetrical Chest wall movement, Good air movement bilaterally, CTAB RRR,No Gallops,Rubs or new Murmurs,  +ve B.Sounds, Abd Soft, No tenderness,   Right thigh up to the knee much more swollen as compared to the left,    Data Review:    Recent Labs  Lab 07/02/23 1336 07/02/23 1349 07/03/23 0406 07/04/23 0834 07/05/23 0459  WBC 14.3*  --  9.7 7.8 8.3  HGB 11.1* 11.6* 9.0* 9.0* 9.0*  HCT 34.7* 34.0* 28.8* 28.4* 28.4*  PLT 262  --  203 214 238  MCV 95.3  --  98.3 96.9 96.9  MCH 30.5  --  30.7 30.7 30.7  MCHC 32.0  --  31.3 31.7 31.7  RDW  14.5  --  14.8 15.2 15.0  LYMPHSABS 2.5  --   --  1.4 1.5  MONOABS 1.5*  --   --  1.0 1.2*  EOSABS 0.0  --   --  0.2 0.1  BASOSABS 0.1  --   --  0.1 0.0    Recent Labs  Lab 07/02/23 1336 07/02/23 1349 07/02/23 2032 07/02/23 2225 07/03/23 0406 07/04/23 0834 07/04/23 1445 07/05/23 0459  NA 134* 131*  --   --  138  --  137 137  K 4.9 4.7  --   --  4.4  --  4.2 4.4  CL 100  --   --   --  108  --  108 110  CO2 13*  --   --   --  15*  --  19* 20*  ANIONGAP 21*  --   --   --  15  --  10 7  GLUCOSE 234*  --   --   --  181*  --  132* 118*  BUN 46*  --   --   --  36*  --  24* 17  CREATININE 2.01*  --   --   --  1.85*  --  1.32* 1.22  AST 22  --   --   --  20  --   --   --   ALT 16  --   --   --  15  --   --   --   ALKPHOS 50  --   --   --  42  --   --   --   BILITOT 3.2*  --   --   --  2.8*  --   --   --   ALBUMIN 3.3*  --   --   --  2.7*  --   --   --   CRP  --   --   --   --   --  7.4*  --  9.5*  PROCALCITON  --   --   --   --   --  0.12  --  0.11  LATICACIDVEN  --   --  2.5* 3.2* 1.3  --   --   --   INR  --   --   --   --   --  1.5*  --   --   TSH  --   --   --   --  1.210  --   --   --   HGBA1C  --   --   --   --  7.6*  --   --   --   BNP  --   --   --   --   --  353.6*  --  348.2*  MG  --   --   --   --  2.3 2.3  --  2.3  CALCIUM 9.4  --   --   --  7.9*  --  8.0* 7.9*      Recent Labs  Lab 07/02/23 1336 07/02/23 2032 07/02/23 2225 07/03/23 0406 07/04/23 0834 07/04/23 1445 07/05/23 0459  CRP  --   --   --   --  7.4*  --  9.5*  PROCALCITON  --   --   --   --  0.12  --  0.11  LATICACIDVEN  --  2.5* 3.2* 1.3  --   --   --   INR  --   --   --   --  1.5*  --   --   TSH  --   --   --  1.210  --   --   --   HGBA1C  --   --   --  7.6*  --   --   --   BNP  --   --   --   --  353.6*  --  348.2*  MG  --   --   --  2.3 2.3  --  2.3  CALCIUM 9.4  --   --  7.9*  --  8.0* 7.9*     --------------------------------------------------------------------------------------------------------------- No results found for: "CHOL", "HDL", "LDLCALC", "LDLDIRECT", "TRIG", "CHOLHDL"  Lab Results  Component Value Date   HGBA1C 7.6 (H) 07/03/2023   Recent Labs    07/03/23 0406  TSH 1.210  Recent Labs    07/03/23 0406  VITAMINB12 471  FOLATE 25.4  FERRITIN 196  TIBC 228*  IRON 61  RETICCTPCT 4.7*   ------------------------------------------------------------------------------------------------------------------ Cardiac Enzymes No results for input(s): "CKMB", "TROPONINI", "MYOGLOBIN" in the last 168 hours.  Invalid input(s): "CK"  Micro Results Recent Results (from the past 240 hours)  Resp panel by RT-PCR (RSV, Flu A&B, Covid) Anterior Nasal Swab     Status: None   Collection Time: 07/02/23  4:59 PM   Specimen: Anterior Nasal Swab  Result Value Ref Range Status   SARS Coronavirus 2 by RT PCR NEGATIVE NEGATIVE Final   Influenza A by PCR NEGATIVE NEGATIVE Final   Influenza B by PCR NEGATIVE NEGATIVE Final    Comment: (NOTE) The Xpert Xpress SARS-CoV-2/FLU/RSV plus assay is intended as an aid in the diagnosis of influenza from Nasopharyngeal swab specimens and should not be used as a sole basis for treatment. Nasal washings and aspirates are unacceptable for Xpert Xpress SARS-CoV-2/FLU/RSV testing.  Fact Sheet for Patients: BloggerCourse.com  Fact Sheet for Healthcare Providers: SeriousBroker.it  This test is not yet approved or cleared by the [place] FDA and has been authorized for detection and/or diagnosis of SARS-CoV-2 by FDA under an Emergency Use Authorization (EUA). This EUA will remain in effect (meaning this test can be used) for the duration of the COVID-19 declaration under Section 564(b)(1) of the Act, 21 U.S.C. section 360bbb-3(b)(1), unless the authorization is terminated  or revoked.     Resp Syncytial Virus by PCR NEGATIVE NEGATIVE Final    Comment: (NOTE) Fact Sheet for Patients: BloggerCourse.com  Fact Sheet for Healthcare Providers: SeriousBroker.it  This test is not yet approved or cleared by the [place] FDA and has been authorized for detection and/or diagnosis of SARS-CoV-2 by FDA under an Emergency Use Authorization (EUA). This EUA will remain in effect (meaning this test can be used) for the duration of the COVID-19 declaration under Section 564(b)(1) of the Act, 21 U.S.C. section 360bbb-3(b)(1), unless the authorization is terminated or revoked.  Performed at [facility], [address], [place], [place] [address]   Blood culture (routine x 2)     Status: None (Preliminary result)   Collection Time: 07/02/23  8:24 PM   Specimen: BLOOD RIGHT HAND  Result Value Ref Range Status   Specimen Description BLOOD RIGHT HAND  Final   Special Requests   Final    BOTTLES DRAWN AEROBIC ONLY Blood Culture results may not be optimal due to an inadequate volume of blood received in culture bottles   Culture   Final    NO GROWTH 2 DAYS Performed at [facility], [address], [place], [place] [address]    Report Status PENDING  Incomplete  Blood culture (routine x 2)     Status: None (Preliminary result)   Collection Time: 07/02/23  8:35 PM   Specimen: BLOOD  Result Value Ref Range Status   Specimen Description BLOOD SITE NOT SPECIFIED  Final   Special Requests   Final    BOTTLES DRAWN AEROBIC ONLY Blood Culture results may not be optimal due to an inadequate volume of blood received in culture bottles   Culture   Final    NO GROWTH 2 DAYS Performed at [facility], [address] [place], [place] [address]    Report Status PENDING  Incomplete  MRSA Next Gen by PCR, Nasal     Status: None   Collection Time: 07/04/23 11:24  AM   Specimen: Nasal Mucosa; Nasal Swab   Result Value Ref Range Status   MRSA by PCR Next Gen NOT DETECTED NOT DETECTED Final    Comment: (NOTE) The GeneXpert MRSA Assay (FDA approved for NASAL specimens only), is one component of a comprehensive MRSA colonization surveillance program. It is not intended to diagnose MRSA infection nor to guide or monitor treatment for MRSA infections. Test performance is not FDA approved in patients less than 72 years old. Performed at [facility], [address] [place], [place] [address]     Radiology Reports MR KNEE RIGHT WO CONTRAST Result Date: 07/04/2023 CLINICAL DATA:  Knee trauma, internal derangement suspected. Reported fall 1 week ago. EXAM: MRI OF THE RIGHT KNEE WITHOUT CONTRAST TECHNIQUE: Multiplanar, multisequence MR imaging of the knee was performed. No intravenous contrast was administered. COMPARISON:  Radiographs 06/27/2023. CT of the right femur 07/02/2023. FINDINGS: MENISCI Medial meniscus:  Intact with normal morphology. Lateral meniscus:  Intact with normal morphology. LIGAMENTS Cruciates: The anterior cruciate ligament appears mildly thickened and ill-defined without definite discontinuity, findings favored to reflect mucoid degeneration. The PCL appears normal. Collaterals: The medial and lateral collateral ligament complexes are intact. CARTILAGE Patellofemoral: Mild fissuring of the patellar cartilage at the apex, best seen on the sagittal images. No subchondral signal abnormality. Medial:  No focal chondral defect. Lateral:  No focal chondral defect. MISCELLANEOUS Joint: Small joint effusion without lipohemarthrosis or intra-articular loose body. Popliteal Fossa: The popliteus muscle and tendon are intact. Small Baker's cyst. Extensor Mechanism: There is a large amount of heterogeneous soft tissue swelling anteriorly in the distal thigh which likely represents soft tissue hematoma. There is evidence of an underlying high-grade partial tear of the distal quadriceps tendon,  involving the superficial central fibers arising from the rectus femoris tendon as well as some of the medial fibers arising from the vastus medialis. The deep central and lateral fibers appear intact. The patellar tendon appears lax, but intact. Possible partial tear of the medial patellofemoral ligament at its patellar attachment. Bones:  No evidence of acute fracture, dislocation or osteonecrosis. Other: Diffuse soft tissue swelling around the knee progressive from recent CT. As above, there is heterogeneous probable hemorrhage around the distal quadriceps tendon, incompletely visualized. IMPRESSION: 1. High-grade partial tear of the distal quadriceps tendon, involving the superficial central fibers arising from the rectus femoris tendon as well as some of the medial fibers arising from the vastus medialis. The deep central and lateral fibers appear intact. 2. Large amount of heterogeneous soft tissue swelling anteriorly in the distal thigh, likely representing soft tissue hematoma. 3. Possible partial tear of the medial patellofemoral ligament at its patellar attachment. 4. The menisci, cruciate and collateral ligaments appear intact. 5. No evidence of acute fracture or osteonecrosis. Electronically Signed   By: Carey Bullocks M.D.   On: 07/04/2023 17:11   VAS [place] LOWER EXTREMITY VENOUS (DVT) Result Date: 07/03/2023  Lower Venous DVT Study Patient Name:  Leroy Chen  Date of Exam:   07/03/2023 Medical Rec #: [id number]         Accession #:    [id number] Date of Birth: March 02, 1956          Patient Gender: M Patient Age:   4 years Exam Location:  [facility] Procedure:      VAS [place] LOWER EXTREMITY VENOUS (DVT) Referring Phys: Ulyess Blossom RATHORE --------------------------------------------------------------------------------  Indications: Pain, and Swelling. Other Indications: Recent fall w/ right leg injury on 06/27/2023. Anticoagulation: Xarelto (afib).  Limitations: Poor ultrasound/tissue interface.  Comparison Study: Previous exam on 06/02/2015 was negative for DVT. Performing Technologist: [name] RVT, RDMS  Examination Guidelines: A complete evaluation includes B-mode imaging, spectral Doppler, color Doppler, and power Doppler as needed of all accessible portions of each vessel. Bilateral testing is considered an integral part of a complete examination. Limited examinations for reoccurring indications may be performed as noted. The reflux portion of the exam is performed with the patient in reverse Trendelenburg.  +---------+---------------+---------+-----------+----------+-------------------+ RIGHT    CompressibilityPhasicitySpontaneityPropertiesThrombus Aging      +---------+---------------+---------+-----------+----------+-------------------+ CFV      Full           Yes      Yes                                      +---------+---------------+---------+-----------+----------+-------------------+ SFJ      Full                                                             +---------+---------------+---------+-----------+----------+-------------------+ FV Prox  Full           Yes      Yes                                      +---------+---------------+---------+-----------+----------+-------------------+ FV Mid   Full           Yes      Yes                                      +---------+---------------+---------+-----------+----------+-------------------+ FV DistalFull           Yes      Yes                                      +---------+---------------+---------+-----------+----------+-------------------+ PFV      Full                                                             +---------+---------------+---------+-----------+----------+-------------------+ POP      Full           Yes      Yes                                      +---------+---------------+---------+-----------+----------+-------------------+ PTV      Full                                                              +---------+---------------+---------+-----------+----------+-------------------+  PERO     Full                                         Not well visualized +---------+---------------+---------+-----------+----------+-------------------+   +----+---------------+---------+-----------+----------+--------------+ LEFTCompressibilityPhasicitySpontaneityPropertiesThrombus Aging +----+---------------+---------+-----------+----------+--------------+ CFV Full           Yes      Yes                                 +----+---------------+---------+-----------+----------+--------------+     Summary: RIGHT: - There is no evidence of deep vein thrombosis in the lower extremity.  - No cystic structure found in the popliteal fossa. - Ultrasound characteristics of enlarged lymph nodes are noted in the groin. Subcutaneous edema noted throughout lower extremity.  LEFT: - No evidence of common femoral vein obstruction.   *See table(s) above for measurements and observations. Electronically signed by Lemar Livings MD on 07/03/2023 at 1:38:07 PM.    Final    CT FEMUR RIGHT W CONTRAST Result Date: 07/03/2023 CLINICAL DATA:  Soft tissue infection suspected. EXAM: CT OF THE LOWER RIGHT EXTREMITY WITH CONTRAST TECHNIQUE: Multidetector CT imaging of the lower right extremity was performed according to the standard protocol following intravenous contrast administration. RADIATION DOSE REDUCTION: This exam was performed according to the departmental dose-optimization program which includes automated exposure control, adjustment of the mA and/or kV according to patient size and/or use of iterative reconstruction technique. CONTRAST:  60mL OMNIPAQUE IOHEXOL 350 MG/ML SOLN COMPARISON:  Right femur x-ray 06/27/2023 FINDINGS: Bones/Joint/Cartilage There is no acute fracture or dislocation. No cortical erosions are seen. There is no significant knee or hip joint effusion identified. There is a  rounded sclerotic focus in the left femoral neck measuring 6 mm which may represent a bone island. Ligaments Suboptimally assessed by CT. Muscles and Tendons There is no intramuscular fluid collection. Deep fascial planes are preserved. There is an intramuscular lipoma in the adductor musculature measuring 3.0 x 1.6 x 2.6 cm. Soft tissues There is subcutaneous edema throughout the mid and lower thigh surrounding the knee. There is no evidence for soft tissue gas or definitive fluid collection. There is a thin linear radiopaque foreign body measuring 1 cm within the lateral subcutaneous tissues of the distal right lower extremity best seen on image 3/203. IMPRESSION: 1. Subcutaneous edema throughout the mid and lower thigh surrounding the knee. No evidence for soft tissue gas or fluid collection. 2. No acute osseous abnormality. 3. Thin linear radiopaque foreign body measuring 1 cm within the lateral subcutaneous tissues of the distal right lower extremity. Electronically Signed   By: Darliss Cheney M.D.   On: 07/03/2023 00:19   CT HIP RIGHT WO CONTRAST Result Date: 07/02/2023 CLINICAL DATA:  Hip trauma, fracture suspected, xray done. EXAM: CT OF THE RIGHT HIP WITHOUT CONTRAST TECHNIQUE: Multidetector CT imaging of the right hip was performed according to the standard protocol. Multiplanar CT image reconstructions were also generated. RADIATION DOSE REDUCTION: This exam was performed according to the departmental dose-optimization program which includes automated exposure control, adjustment of the mA and/or kV according to patient size and/or use of iterative reconstruction technique. COMPARISON:  None Available. FINDINGS: Bones/Joint/Cartilage No acute fracture or dislocation. No aggressive osseous lesion. No significant degenerative changes of right hip joint. Probable mild chronic right sacroiliitis. Ligaments Suboptimally assessed by CT. Muscles and Tendons Suboptimally assessed  by CT scan.  No gross  abnormality seen. Soft tissues Unremarkable urinary bladder. No bowel obstruction. Normal appendix. Partially seen penile prosthesis. IMPRESSION: *No acute osseous injury of the right hip. *Multiple other nonacute observations, as described above. Electronically Signed   By: Jules Schick M.D.   On: 07/02/2023 17:30   DG Chest 1 View Result Date: 07/02/2023 CLINICAL DATA:  Cough.  Right leg redness EXAM: CHEST  1 VIEW COMPARISON:  02/12/2023 FINDINGS: AP upright radiograph demonstrates midline trachea. Normal heart size for level of inspiration. Mild left hemidiaphragm elevation. No pleural effusion or pneumothorax. Clear lungs. IMPRESSION: No active disease. Electronically Signed   By: Jeronimo Greaves M.D.   On: 07/02/2023 15:19      Signature  -   Susa Raring M.D on 07/05/2023 at 8:55 AM   -  To page go to [email or web address]

## 2023-07-05 NOTE — Progress Notes (Signed)
Patient ID: Leroy Chen, male   DOB: 07-31-1955, 67 y.o.   MRN: [id number] Patient is seen in follow-up for the right knee extensor mechanism injury.  Review of the MRI scan shows complete rupture of the quad tendon.  The patella tendon is intact and not under tension.  Patient may eat today and we will plan for surgery for reconstruction of the quad tendon tomorrow.

## 2023-07-05 NOTE — Plan of Care (Signed)
  Problem: Education: Goal: Ability to describe self-care measures that may prevent or decrease complications (Diabetes Survival Skills Education) will improve Outcome: Progressing Goal: Individualized Educational Video(s) Outcome: Progressing   Problem: Coping: Goal: Ability to adjust to condition or change in health will improve Outcome: Progressing   Problem: Fluid Volume: Goal: Ability to maintain a balanced intake and output will improve Outcome: Progressing   Problem: Health Behavior/Discharge Planning: Goal: Ability to identify and utilize available resources and services will improve Outcome: Progressing Goal: Ability to manage health-related needs will improve Outcome: Progressing   Problem: Metabolic: Goal: Ability to maintain appropriate glucose levels will improve Outcome: Progressing   Problem: Nutritional: Goal: Maintenance of adequate nutrition will improve Outcome: Progressing Goal: Progress toward achieving an optimal weight will improve Outcome: Progressing   Problem: Skin Integrity: Goal: Risk for impaired skin integrity will decrease Outcome: Progressing   Problem: Tissue Perfusion: Goal: Adequacy of tissue perfusion will improve Outcome: Progressing   Problem: Education: Goal: Knowledge of General Education information will improve Description: Including pain rating scale, medication(s)/side effects and non-pharmacologic comfort measures Outcome: Progressing   Problem: Health Behavior/Discharge Planning: Goal: Ability to manage health-related needs will improve Outcome: Progressing   Problem: Clinical Measurements: Goal: Ability to maintain clinical measurements within normal limits will improve Outcome: Progressing Goal: Will remain free from infection Outcome: Progressing Goal: Diagnostic test results will improve Outcome: Progressing Goal: Respiratory complications will improve Outcome: Progressing Goal: Cardiovascular complication will  be avoided Outcome: Progressing   Problem: Activity: Goal: Risk for activity intolerance will decrease Outcome: Progressing   Problem: Nutrition: Goal: Adequate nutrition will be maintained Outcome: Progressing   Problem: Coping: Goal: Level of anxiety will decrease Outcome: Progressing   Problem: Elimination: Goal: Will not experience complications related to bowel motility Outcome: Progressing Goal: Will not experience complications related to urinary retention Outcome: Progressing   Problem: Pain Management: Goal: General experience of comfort will improve Outcome: Progressing   Problem: Safety: Goal: Ability to remain free from injury will improve Outcome: Progressing   Problem: Skin Integrity: Goal: Risk for impaired skin integrity will decrease Outcome: Progressing   Problem: Clinical Measurements: Goal: Ability to avoid or minimize complications of infection will improve Outcome: Progressing   Problem: Skin Integrity: Goal: Skin integrity will improve Outcome: Progressing   Problem: Education: Goal: Ability to describe self-care measures that may prevent or decrease complications (Diabetes Survival Skills Education) will improve Outcome: Progressing Goal: Individualized Educational Video(s) Outcome: Progressing   Problem: Cardiac: Goal: Ability to maintain an adequate cardiac output will improve Outcome: Progressing   Problem: Health Behavior/Discharge Planning: Goal: Ability to identify and utilize available resources and services will improve Outcome: Progressing Goal: Ability to manage health-related needs will improve Outcome: Progressing   Problem: Fluid Volume: Goal: Ability to achieve a balanced intake and output will improve Outcome: Progressing   Problem: Metabolic: Goal: Ability to maintain appropriate glucose levels will improve Outcome: Progressing   Problem: Nutritional: Goal: Maintenance of adequate nutrition will  improve Outcome: Progressing Goal: Maintenance of adequate weight for body size and type will improve Outcome: Progressing   Problem: Respiratory: Goal: Will regain and/or maintain adequate ventilation Outcome: Progressing   Problem: Urinary Elimination: Goal: Ability to achieve and maintain adequate renal perfusion and functioning will improve Outcome: Progressing

## 2023-07-05 NOTE — Evaluation (Signed)
Occupational Therapy Evaluation Patient Details Name: Leroy Chen MRN: [id number] DOB: [birth year] Today's Date: 07/05/2023   History of Present Illness 67 y.o. male presenting to [facility] on 07/02/23 for RLE pain and swelling after a fall awaiting quadriceps tendon surgery. Patient was recently seen in the ED on 06/27/23 for fall and right leg injury.  X-ray of the femur showed no evidence of fracture.  His pain was felt to be related to right quadriceps tendon injury.  He was made nonweightbearing on crutches, knee immobilizer placed. Past medical history significant of paroxysmal A-fib status post DCCV in 2021 on Xarelto, history of first and second-degree AV block not requiring pacemaker placement, atrial tachycardia, insulin-dependent type 2 diabetes, CKD stage IIIa, hypertension, hyperlipidemia, former smoker, obesity, hidradenitis suppurativa, depression.   Clinical Impression   Pt admitted for above, he continues to present with RLE during mobility and is fair at maintaining NWB RLE precautions with some min cues here and there but min to CGA to ambulate short distance with RW due to balance inconsistencies. Discussed with pt the benefits of early mobilization pre-op to ensure his functional LLE will continue to support his balance, he verbalized understanding. Pt needing total to max A for LB ADLs and significant assiist for wiping. OT to continue to follow pt acutely to address deficits and help transition to next level of care. No follow-up OT anticipated but pt likely to need post PT recommendations when able to progress with WB.      If plan is discharge home, recommend the following:      Functional Status Assessment  Patient has had a recent decline in their functional status and demonstrates the ability to make significant improvements in function in a reasonable and predictable amount of time.  Equipment Recommendations  Other (comment) (RW; will determine need for [facility] following  post op WB precautions and any furhter limitations)    Recommendations for Other Services       Precautions / Restrictions Precautions Precautions: Fall Restrictions Weight Bearing Restrictions Per Provider Order: Yes RLE Weight Bearing Per Provider Order: Non weight bearing Other Position/Activity Restrictions: WB orders from admission at [facility] pre-surgery      Mobility Bed Mobility Overal bed mobility: Needs Assistance Bed Mobility: Supine to Sit, Sit to Supine     Supine to sit: Min assist, Used rails, HOB elevated Sit to supine: Contact guard assist, Used rails   General bed mobility comments: Min A to move RLE, pt uses LLE sweep technique to reposition RLE and maneuver it when desired    Transfers Overall transfer level: Needs assistance Equipment used: Rolling walker (2 wheels) Transfers: Sit to/from Stand Sit to Stand: Contact guard assist           General transfer comment: ambulated to doorway and back, reinforced keeping RLE off the ground to maintain NWB precautions      Balance Overall balance assessment: Needs assistance Sitting-balance support: Feet supported, No upper extremity supported Sitting balance-Leahy Scale: Fair Sitting balance - Comments: sitting EOB   Standing balance support: Bilateral upper extremity supported, During functional activity, Reliant on assistive device for balance Standing balance-Leahy Scale: Poor Standing balance comment: reliant on RW support                           ADL either performed or assessed with clinical judgement   ADL Overall ADL's : Needs assistance/impaired Eating/Feeding: Independent;Sitting   Grooming: Sitting;Set up;Bed level   Upper  Body Bathing: Set up;Sitting;Bed level   Lower Body Bathing: Maximal assistance;Sitting/lateral leans   Upper Body Dressing : Sitting;Set up   Lower Body Dressing: Total assistance;Sitting/lateral leans Lower Body Dressing Details (indicate cue type and  reason): don socks Toilet Transfer: Minimal assistance;Rolling walker (2 wheels);Stand-pivot Statistician Details (indicate cue type and reason): simulated with recliner Toileting- Clothing Manipulation and Hygiene: Moderate assistance;Sit to/from stand       Functional mobility during ADLs: Contact guard assist;Minimal assistance;Rolling walker (2 wheels) General ADL Comments: Pt needing variable Min to CGA to ambulate in room due to periods of unsteadiness. Reinforced pt continue moving functional LLE to reduce decline in strength since he will rely on it more post-operatively     Vision         Perception         Praxis         Pertinent Vitals/Pain Pain Assessment Pain Assessment: 0-10 Pain Score: 6  Pain Location: R patella thoughout quad Pain Descriptors / Indicators: Aching, Discomfort, Guarding, Grimacing Pain Intervention(s): Limited activity within patient's tolerance, Monitored during session, Premedicated before session     Extremity/Trunk Assessment Upper Extremity Assessment Upper Extremity Assessment: Overall WFL for tasks assessed   Lower Extremity Assessment Lower Extremity Assessment: Defer to PT evaluation       Communication Communication Communication: No apparent difficulties Cueing Techniques: Verbal cues   Cognition Arousal: Alert Behavior During Therapy: WFL for tasks assessed/performed Overall Cognitive Status: Within Functional Limits for tasks assessed                                       General Comments  VSS per montior, HR up to 101 bpm    Exercises     Shoulder Instructions      Home Living Family/patient expects to be discharged to:: Private residence Living Arrangements: Spouse/significant other Available Help at Discharge: Family Type of Home: House Home Access: Stairs to enter [employer] of Steps: 3 from garage (wide opening) Entrance Stairs-Rails: None Home Layout: Two level;Able to  live on main level with bedroom/bathroom     Bathroom Shower/Tub: Walk-in shower;Tub/shower unit   [employer]:  (17 inch toilets) Bathroom Accessibility: Yes   Home Equipment: Shower seat - built in;Grab bars - tub/shower   Additional Comments: remodels houses      Prior Functioning/Environment Prior Level of Function : Independent/Modified Independent;Working/employed;Driving             Mobility Comments: ind ADLs Comments: ind        OT Problem List: Impaired balance (sitting and/or standing);Pain;Decreased knowledge of use of DME or AE      OT Treatment/Interventions: Self-care/ADL training;Balance training;Therapeutic exercise;Therapeutic activities;Patient/family education;DME and/or AE instruction    OT Goals(Current goals can be found in the care plan section) Acute Rehab OT Goals Patient Stated Goal: To get better OT Goal Formulation: With patient Time For Goal Achievement: 07/19/23 Potential to Achieve Goals: Good ADL Goals Pt Will Perform Grooming: with supervision;standing Pt Will Perform Lower Body Bathing: sitting/lateral leans;with set-up Pt Will Perform Lower Body Dressing: sit to/from stand;with min assist Pt Will Transfer to Toilet: ambulating;with supervision Pt Will Perform Tub/Shower Transfer: Shower transfer;ambulating;shower seat;with supervision  OT Frequency: Min 1X/week    Co-evaluation              AM-PAC OT "6 Clicks" Daily Activity     Outcome Measure Help from  another person eating meals?: None Help from another person taking care of personal grooming?: A Little Help from another person toileting, which includes using toliet, bedpan, or urinal?: A Lot Help from another person bathing (including washing, rinsing, drying)?: A Lot Help from another person to put on and taking off regular upper body clothing?: A Little Help from another person to put on and taking off regular lower body clothing?: Total 6 Click Score: 15    End of Session Equipment Utilized During Treatment: Gait belt;Rolling walker (2 wheels) Nurse Communication: Mobility status  Activity Tolerance: Patient tolerated treatment well Patient left: in bed;with call bell/phone within reach  OT Visit Diagnosis: Unsteadiness on feet (R26.81);Other abnormalities of gait and mobility (R26.89);Pain Pain - Right/Left: Right Pain - part of body: Leg                Time: 0865-7846 OT Time Calculation (min): 32 min Charges:  OT General Charges $OT Visit: 1 Visit OT Evaluation $OT Eval Moderate Complexity: 1 Mod OT Treatments $Therapeutic Activity: 8-22 mins  07/05/2023  AB, OTR/L  Acute Rehabilitation Services  Office: [phone]   Leroy Chen 07/05/2023, 5:47 PM

## 2023-07-06 ENCOUNTER — Encounter (HOSPITAL_COMMUNITY)
Admission: EM | Disposition: A | Discharge status: Home Health | Payer: Self-pay | Source: Home / Self Care | Attending: Internal Medicine

## 2023-07-06 ENCOUNTER — Inpatient Hospital Stay (HOSPITAL_COMMUNITY): Payer: No Typology Code available for payment source | Admitting: Anesthesiology

## 2023-07-06 DIAGNOSIS — S76111A Strain of right quadriceps muscle, fascia and tendon, initial encounter: Secondary | ICD-10-CM

## 2023-07-06 DIAGNOSIS — Z794 Long term (current) use of insulin: Secondary | ICD-10-CM

## 2023-07-06 DIAGNOSIS — E119 Type 2 diabetes mellitus without complications: Secondary | ICD-10-CM

## 2023-07-06 DIAGNOSIS — I4891 Unspecified atrial fibrillation: Secondary | ICD-10-CM

## 2023-07-06 DIAGNOSIS — L03115 Cellulitis of right lower limb: Secondary | ICD-10-CM | POA: Diagnosis not present

## 2023-07-06 HISTORY — PX: QUADRICEPS TENDON REPAIR: SHX756

## 2023-07-06 LAB — CBC WITH DIFFERENTIAL/PLATELET
Abs Immature Granulocytes: 0.09 10*3/uL — ABNORMAL HIGH (ref 0.00–0.07)
Basophils Absolute: 0 10*3/uL (ref 0.0–0.1)
Basophils Relative: 1 %
Eosinophils Absolute: 0.1 10*3/uL (ref 0.0–0.5)
Eosinophils Relative: 2 %
HCT: 26 % — ABNORMAL LOW (ref 39.0–52.0)
Hemoglobin: 8.4 g/dL — ABNORMAL LOW (ref 13.0–17.0)
Immature Granulocytes: 1 %
Lymphocytes Relative: 21 %
Lymphs Abs: 1.6 10*3/uL (ref 0.7–4.0)
MCH: 30.8 pg (ref 26.0–34.0)
MCHC: 32.3 g/dL (ref 30.0–36.0)
MCV: 95.2 fL (ref 80.0–100.0)
Monocytes Absolute: 1.2 10*3/uL — ABNORMAL HIGH (ref 0.1–1.0)
Monocytes Relative: 15 %
Neutro Abs: 4.6 10*3/uL (ref 1.7–7.7)
Neutrophils Relative %: 60 %
Platelets: 246 10*3/uL (ref 150–400)
RBC: 2.73 MIL/uL — ABNORMAL LOW (ref 4.22–5.81)
RDW: 15.5 % (ref 11.5–15.5)
WBC: 7.6 10*3/uL (ref 4.0–10.5)
nRBC: 1.8 % — ABNORMAL HIGH (ref 0.0–0.2)

## 2023-07-06 LAB — BASIC METABOLIC PANEL
Anion gap: 8 (ref 5–15)
BUN: 19 mg/dL (ref 8–23)
CO2: 20 mmol/L — ABNORMAL LOW (ref 22–32)
Calcium: 8.1 mg/dL — ABNORMAL LOW (ref 8.9–10.3)
Chloride: 109 mmol/L (ref 98–111)
Creatinine, Ser: 1.47 mg/dL — ABNORMAL HIGH (ref 0.61–1.24)
GFR, Estimated: 52 mL/min — ABNORMAL LOW (ref >60)
Glucose, Bld: 132 mg/dL — ABNORMAL HIGH (ref 70–99)
Potassium: 3.6 mmol/L (ref 3.5–5.1)
Sodium: 137 mmol/L (ref 135–145)

## 2023-07-06 LAB — PHOSPHORUS: Phosphorus: 2.7 mg/dL (ref 2.5–4.6)

## 2023-07-06 LAB — BRAIN NATRIURETIC PEPTIDE: B Natriuretic Peptide: 98.5 pg/mL (ref 0.0–100.0)

## 2023-07-06 LAB — GLUCOSE, CAPILLARY
Glucose-Capillary: 117 mg/dL — ABNORMAL HIGH (ref 70–99)
Glucose-Capillary: 116 mg/dL — ABNORMAL HIGH (ref 70–99)
Glucose-Capillary: 118 mg/dL — ABNORMAL HIGH (ref 70–99)
Glucose-Capillary: 157 mg/dL — ABNORMAL HIGH (ref 70–99)

## 2023-07-06 LAB — C-REACTIVE PROTEIN: CRP: 12.9 mg/dL — ABNORMAL HIGH (ref <1.0)

## 2023-07-06 LAB — MAGNESIUM: Magnesium: 2.3 mg/dL (ref 1.7–2.4)

## 2023-07-06 LAB — PROCALCITONIN: Procalcitonin: 0.13 ng/mL

## 2023-07-06 SURGERY — REPAIR, TENDON, QUADRICEPS
Anesthesia: General | Site: Thigh | Laterality: Right

## 2023-07-06 MED ORDER — METHOCARBAMOL 500 MG PO TABS
500.0000 mg | ORAL_TABLET | Freq: Four times a day (QID) | ORAL | Status: DC | PRN
  Administered 2023-07-07 – 2023-07-11 (×6): 500 mg via ORAL
  Filled 2023-07-06 (×6): qty 1

## 2023-07-06 MED ORDER — PHENYLEPHRINE HCL-NACL 20-0.9 MG/250ML-% IV SOLN
INTRAVENOUS | Status: DC | PRN
  Administered 2023-07-06: 50 ug/min via INTRAVENOUS

## 2023-07-06 MED ORDER — FENTANYL CITRATE (PF) 250 MCG/5ML IJ SOLN
INTRAMUSCULAR | Status: DC | PRN
  Administered 2023-07-06 (×3): 50 ug via INTRAVENOUS

## 2023-07-06 MED ORDER — CHLORHEXIDINE GLUCONATE 0.12 % MT SOLN
OROMUCOSAL | Status: AC
  Filled 2023-07-06: qty 15

## 2023-07-06 MED ORDER — HYDROMORPHONE HCL 1 MG/ML IJ SOLN
INTRAMUSCULAR | Status: DC | PRN
  Administered 2023-07-06: .5 mg via INTRAVENOUS

## 2023-07-06 MED ORDER — ACETAMINOPHEN 500 MG PO TABS
1000.0000 mg | ORAL_TABLET | Freq: Once | ORAL | Status: DC
Start: 2023-07-06 — End: 2023-07-06
  Filled 2023-07-06: qty 2

## 2023-07-06 MED ORDER — PROPOFOL 10 MG/ML IV BOLUS
INTRAVENOUS | Status: DC | PRN
  Administered 2023-07-06: 150 mg via INTRAVENOUS

## 2023-07-06 MED ORDER — HYDROMORPHONE HCL 1 MG/ML IJ SOLN
0.5000 mg | INTRAMUSCULAR | Status: DC | PRN
  Administered 2023-07-06 – 2023-07-09 (×6): 1 mg via INTRAVENOUS
  Administered 2023-07-10 (×2): 0.5 mg via INTRAVENOUS
  Filled 2023-07-06: qty 1
  Filled 2023-07-06: qty 0.5
  Filled 2023-07-06: qty 1
  Filled 2023-07-06: qty 0.5
  Filled 2023-07-06 (×4): qty 1

## 2023-07-06 MED ORDER — LIDOCAINE 2% (20 MG/ML) 5 ML SYRINGE
INTRAMUSCULAR | Status: DC | PRN
  Administered 2023-07-06: 100 mg via INTRAVENOUS

## 2023-07-06 MED ORDER — METOCLOPRAMIDE HCL 5 MG PO TABS
5.0000 mg | ORAL_TABLET | Freq: Three times a day (TID) | ORAL | Status: DC | PRN
Start: 2023-07-06 — End: 2023-07-16

## 2023-07-06 MED ORDER — DOCUSATE SODIUM 100 MG PO CAPS
100.0000 mg | ORAL_CAPSULE | Freq: Two times a day (BID) | ORAL | Status: DC
  Administered 2023-07-07 – 2023-07-12 (×9): 100 mg via ORAL
  Filled 2023-07-06 (×18): qty 1

## 2023-07-06 MED ORDER — ACETAMINOPHEN 10 MG/ML IV SOLN
INTRAVENOUS | Status: DC | PRN
  Administered 2023-07-06: 1000 mg via INTRAVENOUS

## 2023-07-06 MED ORDER — ACETAMINOPHEN 325 MG PO TABS
325.0000 mg | ORAL_TABLET | Freq: Four times a day (QID) | ORAL | Status: DC | PRN
Start: 2023-07-07 — End: 2023-07-16
  Administered 2023-07-08: 650 mg via ORAL
  Administered 2023-07-08: 325 mg via ORAL
  Filled 2023-07-06: qty 2

## 2023-07-06 MED ORDER — ONDANSETRON HCL 4 MG/2ML IJ SOLN: 4.0000 mg | Freq: Four times a day (QID) | INTRAMUSCULAR | Status: DC | PRN

## 2023-07-06 MED ORDER — MUPIROCIN 2 % EX OINT
1.0000 | TOPICAL_OINTMENT | Freq: Two times a day (BID) | CUTANEOUS | Status: DC
  Administered 2023-07-06 – 2023-07-08 (×6): 1 via NASAL
  Filled 2023-07-06 (×2): qty 22

## 2023-07-06 MED ORDER — POVIDONE-IODINE 10 % EX SWAB: 2.0000 | Freq: Once | CUTANEOUS | Status: DC

## 2023-07-06 MED ORDER — CEFAZOLIN SODIUM-DEXTROSE 2-4 GM/100ML-% IV SOLN
2.0000 g | Freq: Four times a day (QID) | INTRAVENOUS | Status: AC
  Administered 2023-07-07: 2 g via INTRAVENOUS
  Filled 2023-07-06 (×2): qty 100

## 2023-07-06 MED ORDER — ONDANSETRON HCL 4 MG PO TABS: 4.0000 mg | ORAL_TABLET | Freq: Four times a day (QID) | ORAL | Status: DC | PRN

## 2023-07-06 MED ORDER — METOCLOPRAMIDE HCL 5 MG/ML IJ SOLN: 5.0000 mg | Freq: Three times a day (TID) | INTRAMUSCULAR | Status: DC | PRN

## 2023-07-06 MED ORDER — ONDANSETRON HCL 4 MG/2ML IJ SOLN
INTRAMUSCULAR | Status: DC | PRN
  Administered 2023-07-06: 4 mg via INTRAVENOUS

## 2023-07-06 MED ORDER — PROPOFOL 1000 MG/100ML IV EMUL
INTRAVENOUS | Status: AC
  Filled 2023-07-06: qty 100

## 2023-07-06 MED ORDER — SODIUM CHLORIDE 0.9 % IV SOLN: INTRAVENOUS | Status: DC | PRN

## 2023-07-06 MED ORDER — MAGNESIUM CITRATE PO SOLN
1.0000 | Freq: Once | ORAL | Status: AC | PRN
  Administered 2023-07-11: 1 via ORAL
  Filled 2023-07-06: qty 296

## 2023-07-06 MED ORDER — PROPOFOL 10 MG/ML IV BOLUS
INTRAVENOUS | Status: AC
  Filled 2023-07-06: qty 20

## 2023-07-06 MED ORDER — OXYCODONE HCL 5 MG PO TABS
5.0000 mg | ORAL_TABLET | ORAL | Status: DC | PRN
  Administered 2023-07-07 – 2023-07-11 (×8): 10 mg via ORAL
  Filled 2023-07-06 (×9): qty 2

## 2023-07-06 MED ORDER — CHLORHEXIDINE GLUCONATE 4 % EX SOLN
60.0000 mL | Freq: Once | CUTANEOUS | Status: AC
Start: 2023-07-06 — End: 2023-07-06
  Administered 2023-07-06: 4 via TOPICAL
  Filled 2023-07-06: qty 60

## 2023-07-06 MED ORDER — ORAL CARE MOUTH RINSE: 15.0000 mL | Freq: Once | OROMUCOSAL | Status: DC

## 2023-07-06 MED ORDER — POLYETHYLENE GLYCOL 3350 17 G PO PACK
17.0000 g | PACK | Freq: Every day | ORAL | Status: DC | PRN
  Administered 2023-07-10: 17 g via ORAL
  Filled 2023-07-06: qty 1

## 2023-07-06 MED ORDER — FENTANYL CITRATE (PF) 250 MCG/5ML IJ SOLN
INTRAMUSCULAR | Status: AC
  Filled 2023-07-06: qty 5

## 2023-07-06 MED ORDER — HYDROMORPHONE HCL 1 MG/ML IJ SOLN
INTRAMUSCULAR | Status: AC
  Filled 2023-07-06: qty 0.5

## 2023-07-06 MED ORDER — FENTANYL CITRATE (PF) 100 MCG/2ML IJ SOLN: 25.0000 ug | INTRAMUSCULAR | Status: DC | PRN

## 2023-07-06 MED ORDER — BISACODYL 10 MG RE SUPP: 10.0000 mg | Freq: Every day | RECTAL | Status: DC | PRN

## 2023-07-06 MED ORDER — CEFAZOLIN SODIUM-DEXTROSE 2-4 GM/100ML-% IV SOLN
INTRAVENOUS | Status: AC
  Administered 2023-07-06: 2 g via INTRAVENOUS
  Filled 2023-07-06: qty 100

## 2023-07-06 MED ORDER — PHENYLEPHRINE 80 MCG/ML (10ML) SYRINGE FOR IV PUSH (FOR BLOOD PRESSURE SUPPORT)
PREFILLED_SYRINGE | INTRAVENOUS | Status: DC | PRN
  Administered 2023-07-06 (×2): 120 ug via INTRAVENOUS

## 2023-07-06 MED ORDER — OXYCODONE HCL 5 MG PO TABS
10.0000 mg | ORAL_TABLET | ORAL | Status: DC | PRN
Start: 2023-07-06 — End: 2023-07-08
  Administered 2023-07-06 – 2023-07-08 (×6): 10 mg via ORAL
  Filled 2023-07-06 (×5): qty 2

## 2023-07-06 MED ORDER — CEFAZOLIN SODIUM-DEXTROSE 2-4 GM/100ML-% IV SOLN
2.0000 g | INTRAVENOUS | Status: AC
  Administered 2023-07-06: 2 g via INTRAVENOUS
  Filled 2023-07-06: qty 100

## 2023-07-06 MED ORDER — MIDAZOLAM HCL 2 MG/2ML IJ SOLN
INTRAMUSCULAR | Status: DC | PRN
  Administered 2023-07-06: 2 mg via INTRAVENOUS

## 2023-07-06 MED ORDER — PHENYLEPHRINE HCL-NACL 20-0.9 MG/250ML-% IV SOLN
INTRAVENOUS | Status: AC
  Filled 2023-07-06: qty 250

## 2023-07-06 MED ORDER — CHLORHEXIDINE GLUCONATE 0.12 % MT SOLN: 15.0000 mL | Freq: Once | OROMUCOSAL | Status: DC

## 2023-07-06 MED ORDER — MIDAZOLAM HCL 2 MG/2ML IJ SOLN
INTRAMUSCULAR | Status: AC
Start: 2023-07-06 — End: ?
  Filled 2023-07-06: qty 2

## 2023-07-06 MED ORDER — METHOCARBAMOL 1000 MG/10ML IJ SOLN
500.0000 mg | Freq: Four times a day (QID) | INTRAMUSCULAR | Status: DC | PRN
Start: 2023-07-06 — End: 2023-07-08

## 2023-07-06 MED ORDER — ALBUMIN HUMAN 5 % IV SOLN: INTRAVENOUS | Status: DC | PRN

## 2023-07-06 SURGICAL SUPPLY — 40 items
BAG COUNTER SPONGE SURGICOUNT (BAG) ×1 IMPLANT
BLADE SURG 10 STRL SS (BLADE) ×1 IMPLANT
BNDG COHESIVE 4X5 TAN STRL (GAUZE/BANDAGES/DRESSINGS) ×1 IMPLANT
BNDG COHESIVE 6X5 TAN NS LF (GAUZE/BANDAGES/DRESSINGS) IMPLANT
BNDG GAUZE DERMACEA FLUFF 4 (GAUZE/BANDAGES/DRESSINGS) ×1 IMPLANT
COVER MAYO STAND STRL (DRAPES) ×1 IMPLANT
COVER SURGICAL LIGHT HANDLE (MISCELLANEOUS) ×1 IMPLANT
DRAPE INCISE IOBAN 66X45 STRL (DRAPES) ×1 IMPLANT
DRAPE U-SHAPE 47X51 STRL (DRAPES) ×1 IMPLANT
DRSG ADAPTIC 3X8 NADH LF (GAUZE/BANDAGES/DRESSINGS) ×1 IMPLANT
DURAPREP 26ML APPLICATOR (WOUND CARE) ×1 IMPLANT
ELECT REM PT RETURN 9FT ADLT (ELECTROSURGICAL) ×1 IMPLANT
ELECTRODE REM PT RTRN 9FT ADLT (ELECTROSURGICAL) ×1 IMPLANT
GAUZE PAD ABD 8X10 STRL (GAUZE/BANDAGES/DRESSINGS) ×2 IMPLANT
GAUZE SPONGE 4X4 12PLY STRL (GAUZE/BANDAGES/DRESSINGS) ×1 IMPLANT
GLOVE BIOGEL PI IND STRL 8.5 (GLOVE) ×1 IMPLANT
GLOVE SURG ORTHO 9.0 STRL STRW (GLOVE) ×1 IMPLANT
GOWN STRL REUS W/ TWL XL LVL3 (GOWN DISPOSABLE) ×1 IMPLANT
GRAFT SKIN WND MICRO 38 (Tissue) IMPLANT
IMMOBILIZER KNEE 24 THIGH 36 (MISCELLANEOUS) IMPLANT
IMMOBILIZER KNEE 24 UNIV (MISCELLANEOUS) ×1 IMPLANT
KIT BASIN OR (CUSTOM PROCEDURE TRAY) ×1 IMPLANT
KIT TURNOVER KIT B (KITS) ×1 IMPLANT
MANIFOLD NEPTUNE II (INSTRUMENTS) ×1 IMPLANT
NS IRRIG 1000ML POUR BTL (IV SOLUTION) ×1 IMPLANT
PACK ORTHO EXTREMITY (CUSTOM PROCEDURE TRAY) ×1 IMPLANT
PAD ABD 8X10 STRL (GAUZE/BANDAGES/DRESSINGS) IMPLANT
PAD ARMBOARD 7.5X6 YLW CONV (MISCELLANEOUS) ×2 IMPLANT
RETRIEVER SUT HEWSON (MISCELLANEOUS) ×1 IMPLANT
SPONGE T-LAP 18X18 ~~LOC~~+RFID (SPONGE) ×2 IMPLANT
STAPLER VISISTAT 35W (STAPLE) ×1 IMPLANT
STOCKINETTE IMPERVIOUS 9X36 MD (GAUZE/BANDAGES/DRESSINGS) ×1 IMPLANT
SUT FIBERWIRE #2 38 T-5 BLUE (SUTURE) ×2 IMPLANT
SUT VIC AB 0 CT1 27XBRD ANBCTR (SUTURE) ×1 IMPLANT
SUTURE FIBERWR #2 38 T-5 BLUE (SUTURE) ×2 IMPLANT
TOWEL GREEN STERILE (TOWEL DISPOSABLE) ×1 IMPLANT
TOWEL GREEN STERILE FF (TOWEL DISPOSABLE) ×1 IMPLANT
TUBE CONNECTING 12X1/4 (SUCTIONS) ×1 IMPLANT
TUBE ENDOTRAC EMG 7X10.2 (MISCELLANEOUS) ×1 IMPLANT
YANKAUER SUCT BULB TIP NO VENT (SUCTIONS) ×1 IMPLANT

## 2023-07-06 NOTE — Op Note (Signed)
07/02/2023 - 07/06/2023  4:46 PM  PATIENT:  Leroy Chen    PRE-OPERATIVE DIAGNOSIS:  Right Quadricep Rupture  POST-OPERATIVE DIAGNOSIS:  Same  PROCEDURE:  REPAIR RIGHT QUADRICEP TENDON Reinforcement with Kerecis tissue graft 38 cm.   SURGEON:  Nadara Mustard, MD  PHYSICIAN ASSISTANT:None ANESTHESIA:   General  PREOPERATIVE INDICATIONS:  Leroy Chen is a  67 y.o. male with a diagnosis of Right Quadricep Rupture who failed conservative measures and elected for surgical management.    The risks benefits and alternatives were discussed with the patient preoperatively including but not limited to the risks of infection, bleeding, nerve injury, cardiopulmonary complications, the need for revision surgery, among others, and the patient was willing to proceed.  OPERATIVE IMPLANTS:   Implant Name Type Inv. Item Serial No. Manufacturer Lot No. LRB No. Used Action  GRAFT SKIN WND MICRO 38 - [id number] Tissue GRAFT SKIN WND MICRO 38  [employer] [id number] Right 1 Implanted    @ENCIMAGES @  OPERATIVE FINDINGS: Patient had a complete rupture of the quadriceps tendon.  Large hematoma both intra-articular and extra-articular.  The hematoma was evacuated.  OPERATIVE PROCEDURE: Patient was brought the operating room and underwent a general anesthetic.  After adequate levels anesthesia were obtained patient's right lower extremity was prepped using DuraPrep draped into a sterile field a timeout was called.  A midline incision was made over the patella and the quad tendon this was carried down to the quad tendon.  This was completely ruptured.  The traumatic wound extended into the suprapatella pouch and there was a large hematoma.  The knee joint was irrigated with normal saline and the hematoma was evacuated.  The wound was irrigated and rondure was used to remove hematoma and other nonviable soft tissue.  Using Krakw suture technique 2 sutures were woven through the proximal quad tendon  with 4 sutures exiting the distal tendon.  2 drill holes were then placed from the patella inferior to superior and a suture passer was placed through the drill holes.  2 of the FiberWire sutures were pulled through each of the drill holes and the quad tendon was sutured down to the patella.  This was used with #2 FiberWire.  A Vicryl suture was then used to reinforce this entire reconstruction from the gutters dorsally and reinforce the repair.  The wound was irrigated with normal saline the tissue repair was reinforced with Kerecis micro graft 38 cm.  The incision was closed using 2-0 nylon a sterile dressing was applied and patient was placed in a knee immobilizer extubated taken to PACU in stable condition.   DISCHARGE PLANNING:  Antibiotic duration: Antibiotics for 24 hours  Weightbearing: Weightbearing as tolerated in the knee immobilizer  Pain medication: Opioid pathway  Dressing care/ Wound VAC: Dry dressing reinforce as needed  Ambulatory devices: Walker or crutches as per therapy  Discharge to: Anticipate discharge to home when patient safe with therapy.  Follow-up: In the office 1 week post operative.

## 2023-07-06 NOTE — Interval H&P Note (Signed)
History and Physical Interval Note:  07/06/2023 6:54 AM  Leroy Chen  has presented today for surgery, with the diagnosis of Right Quadricep Rupture.  The various methods of treatment have been discussed with the patient and family. After consideration of risks, benefits and other options for treatment, the patient has consented to  Procedure(s): REPAIR RIGHT QUADRICEP TENDON (Right) as a surgical intervention.  The patient's history has been reviewed, patient examined, no change in status, stable for surgery.  I have reviewed the patient's chart and labs.  Questions were answered to the patient's satisfaction.     Nadara Mustard

## 2023-07-06 NOTE — Plan of Care (Signed)
  Problem: Education: Goal: Ability to describe self-care measures that may prevent or decrease complications (Diabetes Survival Skills Education) will improve Outcome: Progressing Goal: Individualized Educational Video(s) Outcome: Progressing   Problem: Coping: Goal: Ability to adjust to condition or change in health will improve Outcome: Progressing   Problem: Fluid Volume: Goal: Ability to maintain a balanced intake and output will improve Outcome: Progressing   Problem: Health Behavior/Discharge Planning: Goal: Ability to identify and utilize available resources and services will improve Outcome: Progressing Goal: Ability to manage health-related needs will improve Outcome: Progressing   Problem: Metabolic: Goal: Ability to maintain appropriate glucose levels will improve Outcome: Progressing   Problem: Nutritional: Goal: Maintenance of adequate nutrition will improve Outcome: Progressing Goal: Progress toward achieving an optimal weight will improve Outcome: Progressing   Problem: Skin Integrity: Goal: Risk for impaired skin integrity will decrease Outcome: Progressing   Problem: Tissue Perfusion: Goal: Adequacy of tissue perfusion will improve Outcome: Progressing   Problem: Education: Goal: Knowledge of General Education information will improve Description: Including pain rating scale, medication(s)/side effects and non-pharmacologic comfort measures Outcome: Progressing   Problem: Health Behavior/Discharge Planning: Goal: Ability to manage health-related needs will improve Outcome: Progressing   Problem: Clinical Measurements: Goal: Ability to maintain clinical measurements within normal limits will improve Outcome: Progressing Goal: Will remain free from infection Outcome: Progressing Goal: Diagnostic test results will improve Outcome: Progressing Goal: Respiratory complications will improve Outcome: Progressing Goal: Cardiovascular complication will  be avoided Outcome: Progressing   Problem: Activity: Goal: Risk for activity intolerance will decrease Outcome: Progressing   Problem: Nutrition: Goal: Adequate nutrition will be maintained Outcome: Progressing   Problem: Coping: Goal: Level of anxiety will decrease Outcome: Progressing   Problem: Elimination: Goal: Will not experience complications related to bowel motility Outcome: Progressing Goal: Will not experience complications related to urinary retention Outcome: Progressing   Problem: Pain Management: Goal: General experience of comfort will improve Outcome: Progressing   Problem: Safety: Goal: Ability to remain free from injury will improve Outcome: Progressing   Problem: Skin Integrity: Goal: Risk for impaired skin integrity will decrease Outcome: Progressing   Problem: Clinical Measurements: Goal: Ability to avoid or minimize complications of infection will improve Outcome: Progressing   Problem: Skin Integrity: Goal: Skin integrity will improve Outcome: Progressing   Problem: Education: Goal: Ability to describe self-care measures that may prevent or decrease complications (Diabetes Survival Skills Education) will improve Outcome: Progressing Goal: Individualized Educational Video(s) Outcome: Progressing   Problem: Cardiac: Goal: Ability to maintain an adequate cardiac output will improve Outcome: Progressing   Problem: Health Behavior/Discharge Planning: Goal: Ability to identify and utilize available resources and services will improve Outcome: Progressing Goal: Ability to manage health-related needs will improve Outcome: Progressing   Problem: Fluid Volume: Goal: Ability to achieve a balanced intake and output will improve Outcome: Progressing   Problem: Metabolic: Goal: Ability to maintain appropriate glucose levels will improve Outcome: Progressing   Problem: Nutritional: Goal: Maintenance of adequate nutrition will  improve Outcome: Progressing Goal: Maintenance of adequate weight for body size and type will improve Outcome: Progressing   Problem: Respiratory: Goal: Will regain and/or maintain adequate ventilation Outcome: Progressing   Problem: Urinary Elimination: Goal: Ability to achieve and maintain adequate renal perfusion and functioning will improve Outcome: Progressing

## 2023-07-06 NOTE — Transfer of Care (Signed)
Immediate Anesthesia Transfer of Care Note  Patient: Leroy Chen  Procedure(s) Performed: REPAIR RIGHT QUADRICEP TENDON (Right: Thigh)  Patient Location: PACU  Anesthesia Type:General  Level of Consciousness: sedated  Airway & Oxygen Therapy: Patient Spontanous Breathing and Patient connected to face mask oxygen  Post-op Assessment: Report given to RN and Post -op Vital signs reviewed and stable  Post vital signs: Reviewed and stable  Last Vitals:  Vitals Value Taken Time  BP 120/55 07/06/23 1656  Temp    Pulse 87 07/06/23 1658  Resp 14 07/06/23 1658  SpO2 98 % 07/06/23 1658  Vitals shown include unfiled device data.  Last Pain:  Vitals:   07/06/23 1228  TempSrc:   PainSc: 7       Patients Stated Pain Goal: 0 (07/03/23 2201)  Complications: No notable events documented.

## 2023-07-06 NOTE — Progress Notes (Addendum)
PROGRESS NOTE                                                                                                                                                                                                             Patient Demographics:    Leroy Chen, is a 67 y.o. male, DOB - 08/25/1955, XBJ:[id number]  Outpatient Primary MD for the patient is [name]    LOS - 3  Admit date - 07/02/2023    Chief Complaint  Patient presents with   Leg Pain       Brief Narrative (HPI from H&P)    67 y.o. male with medical history significant of paroxysmal A-fib status post DCCV in 2021 on Xarelto, history of first and second-degree AV block not requiring pacemaker placement, atrial tachycardia, insulin-dependent type 2 diabetes, CKD stage IIIa, hypertension, hyperlipidemia, former smoker, obesity, hidradenitis suppurativa, depression.  Patient was recently seen in the ED on 06/27/23 for fall and right leg injury.  X-ray of the femur showed no evidence of fracture.  His pain was felt to be related to right quadriceps tendon injury.  He was made nonweightbearing on crutches, knee immobilizer placed, and was discharged with plan for orthopedic follow-up.   Continue to have swelling of his right leg with right thigh pain and discomfort came to the ER where CT scan showed very large amount of fluid collection in his right lower extremity in the thigh area, he was admitted for further care.  Further workup suggest that he might have a very large hematoma in his right thigh due to being on Xarelto and having a soft tissue injury.   Subjective:   In good spirits, ready for surgery. Pain is currently controlled. No palpitations, chest pain or dyspnea.   Assessment  & Plan :    Right lower extremity soft tissue injury with fall 1 week ago with right lower extremity swelling and possible secondary cellulitis and foreign body did on  CT scan.  He was diagnosed with severe sepsis at the time of admission as well.  I think patient's main issue is right thigh soft tissue hematoma which he incurred due to his severe fall 1 week prior to admission in the setting of him being on Xarelto, CT scan has been noted.  He is already been placed on antibiotics for possible  secondary cellulitis and sepsis this has been clinically ruled out.  Orthopedics was consulted, MRI of the right leg obtained with large quadriceps tear and patellar injury and soft tissue hematoma, case discussed with Dr. Lajoyce Corners on 07/05/2023, stop antibiotics as this is not sepsis or infection,  - Planning surgical evacuation of hematoma and tendon repair on 07/06/2023.  AKI on CKD stage IIIa - Likely prerenal in etiology in the setting of blood loss, with hydration AKI resolved. Improved overall, Should be able to rehydrate orally once no longer NPO after surgery. Will likely get IVF w/surgery as well.   Sinus tachycardia - Due to #1 above supportive care and pain control. Improved.   Normocytic anemia - Hemoglobin currently 11.1, was 16.5 in July 2024.  Patient is chronically anticoagulated although denies any symptoms of GI bleed.  Acute anemia is clearly due to soft tissue hematoma and blood loss in the right lower extremity.  Monitor.  Type screen also done.  No signs of GI bleed continue PPI. - Recheck CBC in AM with T&S   Paroxysmal A-fib status post DCCV in 2021 - History of atrial tachycardia: Rate is controlled - Continue  metoprolol, and flecainide.   - We are holding DOAC with severe anemia, concern for hematoma. In fact, consented pt for transfusion if he reaches threshold 7g/dl postoperatively.   Elevated total bilirubin  Due to soft tissue hematoma and reabsorption of blood, monitor. Recheck.   Hypertension  Blood pressure stable.  Continue metoprolol given tachycardia.  Hold losartan in the setting of AKI.   Hyperlipidemia  Continue Crestor.    Depression  Continue Zoloft.   GERD  Continue Protonix.  Mild hyponatremia  Gentle hydration   High anion gap metabolic acidosis  - he takes Jardiance and had developed euglycemic DKA which has resolved after insulin drip, transition to subcu insulin and sliding scale, discontinue Jardiance permanently upon discharge.    Insulin-dependent type 2 diabetes - Continue subcu insulin, will be n.p.o. after midnight on 07/06/2023 monitor cautiously.   Lab Results  Component Value Date   HGBA1C 7.6 (H) 07/03/2023   CBG (last 3)  Recent Labs    07/05/23 1537 07/05/23 2056 07/06/23 0739  GLUCAP 186* 159* 117*         Condition - Fair  Family Communication  : Wife [phone]  on 07/04/2023 at 7:53 AM  Code Status :  Full  Consults  :  Ortho  PUD Prophylaxis : PPI   Procedures  :     MRI.  Right lower extremity.    1. High-grade partial tear of the distal quadriceps tendon, involving the superficial central fibers arising from the rectus femoris tendon as well as some of the medial fibers arising from the vastus medialis. The deep central and lateral fibers appear intact. 2. Large amount of heterogeneous soft tissue swelling anteriorly in the distal thigh, likely representing soft tissue hematoma. 3. Possible partial tear of the medial patellofemoral ligament at its patellar attachment. 4. The menisci, cruciate and collateral ligaments appear intact. 5. No evidence of acute fracture or osteonecrosis  Lower extremity venous duplex.  No DVT.    CT right hip.  No fracture.    CT scan tibia-fibula right leg.   1. Subcutaneous edema throughout the mid and lower thigh surrounding the knee. No evidence for soft tissue gas or fluid collection. 2. No acute osseous abnormality. 3. Thin linear radiopaque foreign body measuring 1 cm within the lateral subcutaneous tissues of the distal right  lower extremity.      Disposition Plan  :    Status is: Inpatient   DVT Prophylaxis  :     SCDs Start: 07/04/23 0813 Place and maintain sequential compression device Start: 07/04/23 0718   Lab Results  Component Value Date   PLT 246 07/06/2023    Diet :  Diet Order             Diet NPO time specified  Diet effective ____                    Inpatient Medications  Scheduled Meds:  flecainide  50 mg Oral BID   insulin aspart  0-15 Units Subcutaneous TID WC   insulin aspart  0-5 Units Subcutaneous QHS   insulin glargine-yfgn  25 Units Subcutaneous BID   metoprolol tartrate  50 mg Oral BID   mupirocin ointment  1 Application Nasal BID   pantoprazole  40 mg Oral Daily   rosuvastatin  40 mg Oral Daily   sertraline  100 mg Oral Daily   Continuous Infusions:   PRN Meds:.acetaminophen **OR** acetaminophen, albuterol, dicyclomine, morphine injection, naLOXone (NARCAN)  injection, oxyCODONE    Objective:   Vitals:   07/05/23 2137 07/05/23 2337 07/06/23 0316 07/06/23 0801  BP: (!) 148/61 (!) 157/65  (!) 155/65  Pulse: 100 98  93  Resp:  18  16  Temp:  98.8 F (37.1 C) 98.8 F (37.1 C) 98.4 F (36.9 C)  TempSrc:  Oral Oral Oral  SpO2:  96%  91%  Weight:      Height:        Wt Readings from Last 3 Encounters:  07/02/23 113.4 kg  06/27/23 113.4 kg  02/12/23 113 kg     Intake/Output Summary (Last 24 hours) at 07/06/2023 1036 Last data filed at 07/06/2023 0314 Gross per 24 hour  Intake 100 ml  Output 1050 ml  Net -950 ml     Physical Exam Gen: No distress Pulm: Clear, nonlabored  CV: Irreg irreg, rate in 90's GI: Soft, NT, ND, +BS  Neuro: Alert and oriented. No new focal deficits. Ext: Warm, dry, tender right knee with diffuse edema. No palpable fluctuance, though exam limited by pain.      Data Review:    Recent Labs  Lab 07/02/23 1336 07/02/23 1349 07/03/23 0406 07/04/23 0834 07/05/23 0459 07/06/23 0237  WBC 14.3*  --  9.7 7.8 8.3 7.6  HGB 11.1* 11.6* 9.0* 9.0* 9.0* 8.4*  HCT 34.7* 34.0* 28.8* 28.4* 28.4* 26.0*  PLT 262   --  203 214 238 246  MCV 95.3  --  98.3 96.9 96.9 95.2  MCH 30.5  --  30.7 30.7 30.7 30.8  MCHC 32.0  --  31.3 31.7 31.7 32.3  RDW 14.5  --  14.8 15.2 15.0 15.5  LYMPHSABS 2.5  --   --  1.4 1.5 1.6  MONOABS 1.5*  --   --  1.0 1.2* 1.2*  EOSABS 0.0  --   --  0.2 0.1 0.1  BASOSABS 0.1  --   --  0.1 0.0 0.0    Recent Labs  Lab 07/02/23 1336 07/02/23 1349 07/02/23 2032 07/02/23 2225 07/03/23 0406 07/04/23 0834 07/04/23 1445 07/05/23 0459 07/06/23 0237  NA 134* 131*  --   --  138  --  137 137 137  K 4.9 4.7  --   --  4.4  --  4.2 4.4 3.6  CL 100  --   --   --  108  --  108 110 109  CO2 13*  --   --   --  15*  --  19* 20* 20*  ANIONGAP 21*  --   --   --  15  --  10 7 8   GLUCOSE 234*  --   --   --  181*  --  132* 118* 132*  BUN 46*  --   --   --  36*  --  24* 17 19  CREATININE 2.01*  --   --   --  1.85*  --  1.32* 1.22 1.47*  AST 22  --   --   --  20  --   --   --   --   ALT 16  --   --   --  15  --   --   --   --   ALKPHOS 50  --   --   --  42  --   --   --   --   BILITOT 3.2*  --   --   --  2.8*  --   --   --   --   ALBUMIN 3.3*  --   --   --  2.7*  --   --   --   --   CRP  --   --   --   --   --  7.4*  --  9.5* 12.9*  PROCALCITON  --   --   --   --   --  0.12  --  0.11 0.13  LATICACIDVEN  --   --  2.5* 3.2* 1.3  --   --   --   --   INR  --   --   --   --   --  1.5*  --   --   --   TSH  --   --   --   --  1.210  --   --   --   --   HGBA1C  --   --   --   --  7.6*  --   --   --   --   BNP  --   --   --   --   --  353.6*  --  348.2* 98.5  MG  --   --   --   --  2.3 2.3  --  2.3 2.3  CALCIUM 9.4  --   --   --  7.9*  --  8.0* 7.9* 8.1*      Recent Labs  Lab 07/02/23 1336 07/02/23 2032 07/02/23 2225 07/03/23 0406 07/04/23 0834 07/04/23 1445 07/05/23 0459 07/06/23 0237  CRP  --   --   --   --  7.4*  --  9.5* 12.9*  PROCALCITON  --   --   --   --  0.12  --  0.11 0.13  LATICACIDVEN  --  2.5* 3.2* 1.3  --   --   --   --   INR  --   --   --   --  1.5*  --   --   --   TSH   --   --   --  1.210  --   --   --   --   HGBA1C  --   --   --  7.6*  --   --   --   --   BNP  --   --   --   --  353.6*  --  348.2* 98.5  MG  --   --   --  2.3 2.3  --  2.3 2.3  CALCIUM 9.4  --   --  7.9*  --  8.0* 7.9* 8.1*    --------------------------------------------------------------------------------------------------------------- No results found for: "CHOL", "HDL", "LDLCALC", "LDLDIRECT", "TRIG", "CHOLHDL"  Lab Results  Component Value Date   HGBA1C 7.6 (H) 07/03/2023   No results for input(s): "TSH", "T4TOTAL", "FREET4", "T3FREE", "THYROIDAB" in the last 72 hours.  No results for input(s): "VITAMINB12", "FOLATE", "FERRITIN", "TIBC", "IRON", "RETICCTPCT" in the last 72 hours.  ------------------------------------------------------------------------------------------------------------------ Cardiac Enzymes No results for input(s): "CKMB", "TROPONINI", "MYOGLOBIN" in the last 168 hours.  Invalid input(s): "CK"  Micro Results Recent Results (from the past 240 hours)  Resp panel by RT-PCR (RSV, Flu A&B, Covid) Anterior Nasal Swab     Status: None   Collection Time: 07/02/23  4:59 PM   Specimen: Anterior Nasal Swab  Result Value Ref Range Status   SARS Coronavirus 2 by RT PCR NEGATIVE NEGATIVE Final   Influenza A by PCR NEGATIVE NEGATIVE Final   Influenza B by PCR NEGATIVE NEGATIVE Final    Comment: (NOTE) The Xpert Xpress SARS-CoV-2/FLU/RSV plus assay is intended as an aid in the diagnosis of influenza from Nasopharyngeal swab specimens and should not be used as a sole basis for treatment. Nasal washings and aspirates are unacceptable for Xpert Xpress SARS-CoV-2/FLU/RSV testing.  Fact Sheet for Patients: BloggerCourse.com  Fact Sheet for Healthcare Providers: SeriousBroker.it  This test is not yet approved or cleared by the [place] FDA and has been authorized for detection and/or diagnosis of SARS-CoV-2  by FDA under an Emergency Use Authorization (EUA). This EUA will remain in effect (meaning this test can be used) for the duration of the COVID-19 declaration under Section 564(b)(1) of the Act, 21 U.S.C. section 360bbb-3(b)(1), unless the authorization is terminated or revoked.     Resp Syncytial Virus by PCR NEGATIVE NEGATIVE Final    Comment: (NOTE) Fact Sheet for Patients: BloggerCourse.com  Fact Sheet for Healthcare Providers: SeriousBroker.it  This test is not yet approved or cleared by the [place] FDA and has been authorized for detection and/or diagnosis of SARS-CoV-2 by FDA under an Emergency Use Authorization (EUA). This EUA will remain in effect (meaning this test can be used) for the duration of the COVID-19 declaration under Section 564(b)(1) of the Act, 21 U.S.C. section 360bbb-3(b)(1), unless the authorization is terminated or revoked.  Performed at [facility], [address], [place], [place] [address]   Blood culture (routine x 2)     Status: None (Preliminary result)   Collection Time: 07/02/23  8:24 PM   Specimen: BLOOD RIGHT HAND  Result Value Ref Range Status   Specimen Description BLOOD RIGHT HAND  Final   Special Requests   Final    BOTTLES DRAWN AEROBIC ONLY Blood Culture results may not be optimal due to an inadequate volume of blood received in culture bottles   Culture   Final    NO GROWTH 4 DAYS Performed at [facility], [address], [place], [place] [address]    Report Status PENDING  Incomplete  Blood culture (routine x 2)     Status: None (Preliminary result)   Collection Time: 07/02/23  8:35 PM   Specimen: BLOOD  Result Value Ref Range Status   Specimen Description BLOOD SITE NOT SPECIFIED  Final   Special Requests   Final    BOTTLES DRAWN  AEROBIC ONLY Blood Culture results may not be optimal due to an inadequate volume of blood received in culture bottles   Culture    Final    NO GROWTH 4 DAYS Performed at [facility], [address] [place], [place] [address]    Report Status PENDING  Incomplete  MRSA Next Gen by PCR, Nasal     Status: None   Collection Time: 07/04/23 11:24 AM   Specimen: Nasal Mucosa; Nasal Swab  Result Value Ref Range Status   MRSA by PCR Next Gen NOT DETECTED NOT DETECTED Final    Comment: (NOTE) The GeneXpert MRSA Assay (FDA approved for NASAL specimens only), is one component of a comprehensive MRSA colonization surveillance program. It is not intended to diagnose MRSA infection nor to guide or monitor treatment for MRSA infections. Test performance is not FDA approved in patients less than 57 years old. Performed at [facility], [address], [place], [place] [address]     Radiology Reports MR KNEE RIGHT WO CONTRAST Result Date: 07/04/2023 CLINICAL DATA:  Knee trauma, internal derangement suspected. Reported fall 1 week ago. EXAM: MRI OF THE RIGHT KNEE WITHOUT CONTRAST TECHNIQUE: Multiplanar, multisequence MR imaging of the knee was performed. No intravenous contrast was administered. COMPARISON:  Radiographs 06/27/2023. CT of the right femur 07/02/2023. FINDINGS: MENISCI Medial meniscus:  Intact with normal morphology. Lateral meniscus:  Intact with normal morphology. LIGAMENTS Cruciates: The anterior cruciate ligament appears mildly thickened and ill-defined without definite discontinuity, findings favored to reflect mucoid degeneration. The PCL appears normal. Collaterals: The medial and lateral collateral ligament complexes are intact. CARTILAGE Patellofemoral: Mild fissuring of the patellar cartilage at the apex, best seen on the sagittal images. No subchondral signal abnormality. Medial:  No focal chondral defect. Lateral:  No focal chondral defect. MISCELLANEOUS Joint: Small joint effusion without lipohemarthrosis or intra-articular loose body. Popliteal Fossa: The popliteus muscle and tendon are intact.  Small Baker's cyst. Extensor Mechanism: There is a large amount of heterogeneous soft tissue swelling anteriorly in the distal thigh which likely represents soft tissue hematoma. There is evidence of an underlying high-grade partial tear of the distal quadriceps tendon, involving the superficial central fibers arising from the rectus femoris tendon as well as some of the medial fibers arising from the vastus medialis. The deep central and lateral fibers appear intact. The patellar tendon appears lax, but intact. Possible partial tear of the medial patellofemoral ligament at its patellar attachment. Bones:  No evidence of acute fracture, dislocation or osteonecrosis. Other: Diffuse soft tissue swelling around the knee progressive from recent CT. As above, there is heterogeneous probable hemorrhage around the distal quadriceps tendon, incompletely visualized. IMPRESSION: 1. High-grade partial tear of the distal quadriceps tendon, involving the superficial central fibers arising from the rectus femoris tendon as well as some of the medial fibers arising from the vastus medialis. The deep central and lateral fibers appear intact. 2. Large amount of heterogeneous soft tissue swelling anteriorly in the distal thigh, likely representing soft tissue hematoma. 3. Possible partial tear of the medial patellofemoral ligament at its patellar attachment. 4. The menisci, cruciate and collateral ligaments appear intact. 5. No evidence of acute fracture or osteonecrosis. Electronically Signed   By: Carey Bullocks M.D.   On: 07/04/2023 17:11   VAS [place] LOWER EXTREMITY VENOUS (DVT) Result Date: 07/03/2023  Lower Venous DVT Study Patient Name:  Leroy Chen  Date of Exam:   07/03/2023 Medical Rec #: [id number]  Accession #:    [id number] Date of Birth: July 23, 1955          Patient Gender: M Patient Age:   82 years Exam Location:  [facility] Procedure:      VAS [place] LOWER EXTREMITY VENOUS (DVT) Referring Phys: Ulyess Blossom  RATHORE --------------------------------------------------------------------------------  Indications: Pain, and Swelling. Other Indications: Recent fall w/ right leg injury on 06/27/2023. Anticoagulation: Xarelto (afib). Limitations: Poor ultrasound/tissue interface. Comparison Study: Previous exam on 06/02/2015 was negative for DVT. Performing Technologist: [name] RVT, RDMS  Examination Guidelines: A complete evaluation includes B-mode imaging, spectral Doppler, color Doppler, and power Doppler as needed of all accessible portions of each vessel. Bilateral testing is considered an integral part of a complete examination. Limited examinations for reoccurring indications may be performed as noted. The reflux portion of the exam is performed with the patient in reverse Trendelenburg.  +---------+---------------+---------+-----------+----------+-------------------+ RIGHT    CompressibilityPhasicitySpontaneityPropertiesThrombus Aging      +---------+---------------+---------+-----------+----------+-------------------+ CFV      Full           Yes      Yes                                      +---------+---------------+---------+-----------+----------+-------------------+ SFJ      Full                                                             +---------+---------------+---------+-----------+----------+-------------------+ FV Prox  Full           Yes      Yes                                      +---------+---------------+---------+-----------+----------+-------------------+ FV Mid   Full           Yes      Yes                                      +---------+---------------+---------+-----------+----------+-------------------+ FV DistalFull           Yes      Yes                                      +---------+---------------+---------+-----------+----------+-------------------+ PFV      Full                                                              +---------+---------------+---------+-----------+----------+-------------------+ POP      Full           Yes      Yes                                      +---------+---------------+---------+-----------+----------+-------------------+  PTV      Full                                                             +---------+---------------+---------+-----------+----------+-------------------+ PERO     Full                                         Not well visualized +---------+---------------+---------+-----------+----------+-------------------+   +----+---------------+---------+-----------+----------+--------------+ LEFTCompressibilityPhasicitySpontaneityPropertiesThrombus Aging +----+---------------+---------+-----------+----------+--------------+ CFV Full           Yes      Yes                                 +----+---------------+---------+-----------+----------+--------------+     Summary: RIGHT: - There is no evidence of deep vein thrombosis in the lower extremity.  - No cystic structure found in the popliteal fossa. - Ultrasound characteristics of enlarged lymph nodes are noted in the groin. Subcutaneous edema noted throughout lower extremity.  LEFT: - No evidence of common femoral vein obstruction.   *See table(s) above for measurements and observations. Electronically signed by Lemar Livings MD on 07/03/2023 at 1:38:07 PM.    Final    CT FEMUR RIGHT W CONTRAST Result Date: 07/03/2023 CLINICAL DATA:  Soft tissue infection suspected. EXAM: CT OF THE LOWER RIGHT EXTREMITY WITH CONTRAST TECHNIQUE: Multidetector CT imaging of the lower right extremity was performed according to the standard protocol following intravenous contrast administration. RADIATION DOSE REDUCTION: This exam was performed according to the departmental dose-optimization program which includes automated exposure control, adjustment of the mA and/or kV according to patient size and/or use of iterative  reconstruction technique. CONTRAST:  60mL OMNIPAQUE IOHEXOL 350 MG/ML SOLN COMPARISON:  Right femur x-ray 06/27/2023 FINDINGS: Bones/Joint/Cartilage There is no acute fracture or dislocation. No cortical erosions are seen. There is no significant knee or hip joint effusion identified. There is a rounded sclerotic focus in the left femoral neck measuring 6 mm which may represent a bone island. Ligaments Suboptimally assessed by CT. Muscles and Tendons There is no intramuscular fluid collection. Deep fascial planes are preserved. There is an intramuscular lipoma in the adductor musculature measuring 3.0 x 1.6 x 2.6 cm. Soft tissues There is subcutaneous edema throughout the mid and lower thigh surrounding the knee. There is no evidence for soft tissue gas or definitive fluid collection. There is a thin linear radiopaque foreign body measuring 1 cm within the lateral subcutaneous tissues of the distal right lower extremity best seen on image 3/203. IMPRESSION: 1. Subcutaneous edema throughout the mid and lower thigh surrounding the knee. No evidence for soft tissue gas or fluid collection. 2. No acute osseous abnormality. 3. Thin linear radiopaque foreign body measuring 1 cm within the lateral subcutaneous tissues of the distal right lower extremity. Electronically Signed   By: Darliss Cheney M.D.   On: 07/03/2023 00:19   CT HIP RIGHT WO CONTRAST Result Date: 07/02/2023 CLINICAL DATA:  Hip trauma, fracture suspected, xray done. EXAM: CT OF THE RIGHT HIP WITHOUT CONTRAST TECHNIQUE: Multidetector CT imaging of the right hip was performed according to the standard protocol. Multiplanar CT image reconstructions were also generated. RADIATION DOSE REDUCTION: This  exam was performed according to the departmental dose-optimization program which includes automated exposure control, adjustment of the mA and/or kV according to patient size and/or use of iterative reconstruction technique. COMPARISON:  None Available.  FINDINGS: Bones/Joint/Cartilage No acute fracture or dislocation. No aggressive osseous lesion. No significant degenerative changes of right hip joint. Probable mild chronic right sacroiliitis. Ligaments Suboptimally assessed by CT. Muscles and Tendons Suboptimally assessed by CT scan.  No gross abnormality seen. Soft tissues Unremarkable urinary bladder. No bowel obstruction. Normal appendix. Partially seen penile prosthesis. IMPRESSION: *No acute osseous injury of the right hip. *Multiple other nonacute observations, as described above. Electronically Signed   By: Jules Schick M.D.   On: 07/02/2023 17:30   DG Chest 1 View Result Date: 07/02/2023 CLINICAL DATA:  Cough.  Right leg redness EXAM: CHEST  1 VIEW COMPARISON:  02/12/2023 FINDINGS: AP upright radiograph demonstrates midline trachea. Normal heart size for level of inspiration. Mild left hemidiaphragm elevation. No pleural effusion or pneumothorax. Clear lungs. IMPRESSION: No active disease. Electronically Signed   By: Jeronimo Greaves M.D.   On: 07/02/2023 15:19      Signature  -   Tyrone Nine M.D on 07/06/2023 at 10:36 AM   -  To page go to [email or web address]

## 2023-07-06 NOTE — Anesthesia Preprocedure Evaluation (Addendum)
Anesthesia Evaluation  Patient identified by MRN, date of birth, ID band Patient awake    Reviewed: Allergy & Precautions, NPO status , Patient's Chart, lab work & pertinent test results, reviewed documented beta blocker date and time   Airway Mallampati: II  TM Distance: >3 FB Neck ROM: Full    Dental no notable dental hx. (+) Teeth Intact, Dental Advisory Given   Pulmonary former smoker   Pulmonary exam normal breath sounds clear to auscultation       Cardiovascular Normal cardiovascular exam+ dysrhythmias Atrial Fibrillation  Rhythm:Regular Rate:Normal  TTE 2022 1. Left ventricular ejection fraction, by estimation, is 65 to 70%. The  left ventricle has normal function. The left ventricle has no regional  wall motion abnormalities. Left ventricular diastolic parameters were  normal.   2. Right ventricular systolic function is normal. The right ventricular  size is normal.   3. The mitral valve is normal in structure. No evidence of mitral valve  regurgitation. No evidence of mitral stenosis.   4. The aortic valve is calcified. Aortic valve regurgitation is not  visualized. Mild aortic valve sclerosis is present, with no evidence of  aortic valve stenosis.   5. The inferior vena cava is normal in size with greater than 50%  respiratory variability, suggesting right atrial pressure of 3 mmHg.     Neuro/Psych negative neurological ROS  negative psych ROS   GI/Hepatic Neg liver ROS, PUD,,,UC   Endo/Other  diabetes, Type 2, Insulin Dependent, Oral Hypoglycemic Agents    Renal/GU Renal InsufficiencyRenal diseaseLab Results      Component                Value               Date                      NA                       137                 07/06/2023                CL                       109                 07/06/2023                K                        3.6                 07/06/2023                CO2                       20 (L)              07/06/2023                BUN                      19                  07/06/2023  CREATININE               1.47 (H)            07/06/2023                GFRNONAA                 52 (L)              07/06/2023                CALCIUM                  8.1 (L)             07/06/2023                PHOS                     2.7                 07/06/2023                ALBUMIN                  2.7 (L)             07/03/2023                GLUCOSE                  132 (H)             07/06/2023             negative genitourinary   Musculoskeletal negative musculoskeletal ROS (+)    Abdominal   Peds  Hematology  (+) Blood dyscrasia (xarelto), anemia Lab Results      Component                Value               Date                      WBC                      7.6                 07/06/2023                HGB                      8.4 (L)             07/06/2023                HCT                      26.0 (L)            07/06/2023                MCV                      95.2                07/06/2023                PLT  246                 07/06/2023              Anesthesia Other Findings   Reproductive/Obstetrics                             Anesthesia Physical Anesthesia Plan  ASA: 3  Anesthesia Plan: General   Post-op Pain Management: Tylenol PO (pre-op)*   Induction: Intravenous  PONV Risk Score and Plan: 2 and Ondansetron, Dexamethasone and Midazolam  Airway Management Planned: LMA  Additional Equipment:   Intra-op Plan:   Post-operative Plan: Extubation in OR  Informed Consent: I have reviewed the patients History and Physical, chart, labs and discussed the procedure including the risks, benefits and alternatives for the proposed anesthesia with the patient or authorized representative who has indicated his/her understanding and acceptance.     Dental advisory given  Plan  Discussed with: CRNA  Anesthesia Plan Comments: (No regional anesthesia per surgeon)       Anesthesia Quick Evaluation

## 2023-07-06 NOTE — Progress Notes (Signed)
[facility] Pre-op Review  Pre-op checklist: To be completed by bedside RN    NPO: ordered  Labs: Hbg 8.4  Consent: in sign and held  H&P: interval H&P done by Lajoyce Corners today  Vitals: elevated bp  O2 requirements: RA  MAR/PTA review: BB:Due at 10, GLP last taken last month per med rec, no anticoags  IV: 20gx2  Floor nurse name:  Julieanne Cotton, RN   Additional info:   Tele

## 2023-07-07 DIAGNOSIS — L03115 Cellulitis of right lower limb: Secondary | ICD-10-CM | POA: Diagnosis not present

## 2023-07-07 LAB — BASIC METABOLIC PANEL
Anion gap: 11 (ref 5–15)
BUN: 18 mg/dL (ref 8–23)
CO2: 22 mmol/L (ref 22–32)
Calcium: 8.6 mg/dL — ABNORMAL LOW (ref 8.9–10.3)
Chloride: 105 mmol/L (ref 98–111)
Creatinine, Ser: 1.21 mg/dL (ref 0.61–1.24)
GFR, Estimated: >60 mL/min (ref >60)
Glucose, Bld: 157 mg/dL — ABNORMAL HIGH (ref 70–99)
Potassium: 4.7 mmol/L (ref 3.5–5.1)
Sodium: 138 mmol/L (ref 135–145)

## 2023-07-07 LAB — CULTURE, BLOOD (ROUTINE X 2)
Culture: NO GROWTH
Culture: NO GROWTH

## 2023-07-07 LAB — SURGICAL PCR SCREEN
MRSA, PCR: NEGATIVE
Staphylococcus aureus: NEGATIVE

## 2023-07-07 LAB — CBC WITH DIFFERENTIAL/PLATELET
Abs Immature Granulocytes: 0.08 10*3/uL — ABNORMAL HIGH (ref 0.00–0.07)
Basophils Absolute: 0 10*3/uL (ref 0.0–0.1)
Basophils Relative: 0 %
Eosinophils Absolute: 0.1 10*3/uL (ref 0.0–0.5)
Eosinophils Relative: 1 %
HCT: 26.7 % — ABNORMAL LOW (ref 39.0–52.0)
Hemoglobin: 8.5 g/dL — ABNORMAL LOW (ref 13.0–17.0)
Immature Granulocytes: 1 %
Lymphocytes Relative: 11 %
Lymphs Abs: 0.9 10*3/uL (ref 0.7–4.0)
MCH: 31.1 pg (ref 26.0–34.0)
MCHC: 31.8 g/dL (ref 30.0–36.0)
MCV: 97.8 fL (ref 80.0–100.0)
Monocytes Absolute: 1.2 10*3/uL — ABNORMAL HIGH (ref 0.1–1.0)
Monocytes Relative: 15 %
Neutro Abs: 5.8 10*3/uL (ref 1.7–7.7)
Neutrophils Relative %: 72 %
Platelets: 262 10*3/uL (ref 150–400)
RBC: 2.73 MIL/uL — ABNORMAL LOW (ref 4.22–5.81)
RDW: 15.5 % (ref 11.5–15.5)
WBC: 8.1 10*3/uL (ref 4.0–10.5)
nRBC: 0.9 % — ABNORMAL HIGH (ref 0.0–0.2)

## 2023-07-07 LAB — GLUCOSE, CAPILLARY
Glucose-Capillary: 135 mg/dL — ABNORMAL HIGH (ref 70–99)
Glucose-Capillary: 137 mg/dL — ABNORMAL HIGH (ref 70–99)
Glucose-Capillary: 173 mg/dL — ABNORMAL HIGH (ref 70–99)
Glucose-Capillary: 157 mg/dL — ABNORMAL HIGH (ref 70–99)

## 2023-07-07 LAB — C-REACTIVE PROTEIN: CRP: 17.7 mg/dL — ABNORMAL HIGH (ref <1.0)

## 2023-07-07 LAB — BRAIN NATRIURETIC PEPTIDE: B Natriuretic Peptide: 74.8 pg/mL (ref 0.0–100.0)

## 2023-07-07 LAB — PHOSPHORUS: Phosphorus: 3.7 mg/dL (ref 2.5–4.6)

## 2023-07-07 LAB — PROCALCITONIN: Procalcitonin: 0.14 ng/mL

## 2023-07-07 LAB — MAGNESIUM: Magnesium: 2.2 mg/dL (ref 1.7–2.4)

## 2023-07-07 MED ORDER — HEPARIN SODIUM (PORCINE) 5000 UNIT/ML IJ SOLN
5000.0000 [IU] | Freq: Three times a day (TID) | INTRAMUSCULAR | Status: DC
  Administered 2023-07-07 – 2023-07-08 (×3): 5000 [IU] via SUBCUTANEOUS
  Filled 2023-07-07 (×3): qty 1

## 2023-07-07 NOTE — Evaluation (Signed)
Physical Therapy Evaluation Patient Details Name: JAYCE GUARDIA MRN: [id number] DOB: [birth year] Today's Date: 07/07/2023  History of Present Illness  67 y.o. male presenting to [facility] on 07/02/23 for RLE pain and swelling after a fall awaiting quadriceps tendon surgery. Patient was recently seen in the ED on 06/27/23 for fall and right leg injury.  X-ray of the femur showed no evidence of fracture.  His pain was felt to be related to right quadriceps tendon injury.  He was made nonweightbearing on crutches, knee immobilizer placed. Past medical history significant of paroxysmal A-fib status post DCCV in 2021 on Xarelto, history of first and second-degree AV block not requiring pacemaker placement, atrial tachycardia, insulin-dependent type 2 diabetes, CKD stage IIIa, hypertension, hyperlipidemia, former smoker, obesity, hidradenitis suppurativa, depression. Pt is now s/p quad tendon repair on 12/20.  Clinical Impression  Pt presents with admitting diagnosis above. Pt today was able to stand with Min A RW however session limited due to dizziness. BP: 98/69 seated after standing. BP: 126/55 supine. Further ambulation deferred. PTA pt was fully independent with no AD. Anticipate that pt will progress well and recommend HHPT upon DC with RW once pt is able to ambulate in hallway and complete stair training. PT will continue to follow.        If plan is discharge home, recommend the following: A little help with walking and/or transfers;A little help with bathing/dressing/bathroom;Assistance with cooking/housework;Assist for transportation;Help with stairs or ramp for entrance   Can travel by private vehicle        Equipment Recommendations Rolling walker (2 wheels)  Recommendations for Other Services       Functional Status Assessment Patient has had a recent decline in their functional status and demonstrates the ability to make significant improvements in function in a reasonable and predictable  amount of time.     Precautions / Restrictions Precautions Precautions: Fall Restrictions Weight Bearing Restrictions Per Provider Order: Yes RLE Weight Bearing Per Provider Order: Weight bearing as tolerated      Mobility  Bed Mobility Overal bed mobility: Needs Assistance Bed Mobility: Supine to Sit, Sit to Supine     Supine to sit: Min assist, Used rails, HOB elevated Sit to supine: Min assist   General bed mobility comments: Min A to move RLE, pt uses LLE sweep technique to reposition RLE and maneuver it when desired    Transfers Overall transfer level: Needs assistance Equipment used: Rolling walker (2 wheels) Transfers: Sit to/from Stand Sit to Stand: Min assist, Contact guard assist           General transfer comment: Cues for hand placement. CGA initially however as pt began to take steps forward pt report dizziness and required Min A to back up and sit down.    Ambulation/Gait               General Gait Details: Deferred due to dizziness.  Stairs            Wheelchair Mobility     Tilt Bed    Modified Rankin (Stroke Patients Only)       Balance Overall balance assessment: Needs assistance Sitting-balance support: Feet supported, No upper extremity supported Sitting balance-Leahy Scale: Fair Sitting balance - Comments: sitting EOB   Standing balance support: Bilateral upper extremity supported, During functional activity, Reliant on assistive device for balance Standing balance-Leahy Scale: Poor Standing balance comment: reliant on RW support  Pertinent Vitals/Pain Pain Assessment Pain Assessment: 0-10 Pain Score: 7  Pain Location: R patella thoughout quad Pain Descriptors / Indicators: Aching, Discomfort, Guarding, Grimacing Pain Intervention(s): Monitored during session    Home Living Family/patient expects to be discharged to:: Private residence Living Arrangements: Spouse/significant  other Available Help at Discharge: Family Type of Home: House Home Access: Stairs to enter Entrance Stairs-Rails: None Entrance Stairs-Number of Steps: 3 from garage Alternate Level Stairs-Number of Steps: 17 Home Layout: Two level;Able to live on main level with bedroom/bathroom Home Equipment: Shower seat - built in;Grab bars - tub/shower;Crutches      Prior Function Prior Level of Function : Independent/Modified Independent;Driving             Mobility Comments: ind ADLs Comments: ind     Extremity/Trunk Assessment   Upper Extremity Assessment Upper Extremity Assessment: Overall WFL for tasks assessed    Lower Extremity Assessment Lower Extremity Assessment: RLE deficits/detail RLE Deficits / Details: Quad tendon repair    Cervical / Trunk Assessment Cervical / Trunk Assessment: Normal  Communication   Communication Communication: No apparent difficulties Cueing Techniques: Verbal cues;Tactile cues  Cognition Arousal: Alert Behavior During Therapy: WFL for tasks assessed/performed Overall Cognitive Status: Within Functional Limits for tasks assessed                                          General Comments General comments (skin integrity, edema, etc.): VSS    Exercises     Assessment/Plan    PT Assessment Patient needs continued PT services  PT Problem List Decreased strength;Decreased range of motion;Decreased activity tolerance;Decreased balance;Decreased mobility;Decreased coordination;Decreased knowledge of use of DME;Decreased safety awareness;Decreased knowledge of precautions;Cardiopulmonary status limiting activity       PT Treatment Interventions DME instruction;Gait training;Stair training;Functional mobility training;Therapeutic activities;Therapeutic exercise;Balance training;Neuromuscular re-education;Cognitive remediation;Patient/family education    PT Goals (Current goals can be found in the Care Plan section)  Acute  Rehab PT Goals Patient Stated Goal: to go home PT Goal Formulation: With patient Time For Goal Achievement: 07/21/23 Potential to Achieve Goals: Good    Frequency Min 1X/week     Co-evaluation               AM-PAC PT "6 Clicks" Mobility  Outcome Measure Help needed turning from your back to your side while in a flat bed without using bedrails?: A Little Help needed moving from lying on your back to sitting on the side of a flat bed without using bedrails?: A Little Help needed moving to and from a bed to a chair (including a wheelchair)?: A Little Help needed standing up from a chair using your arms (e.g., wheelchair or bedside chair)?: A Little Help needed to walk in hospital room?: A Lot Help needed climbing 3-5 steps with a railing? : Total 6 Click Score: 15    End of Session Equipment Utilized During Treatment: Gait belt Activity Tolerance: Other (comment) (Limited by dizziness) Patient left: in bed;with call bell/phone within reach Nurse Communication: Mobility status PT Visit Diagnosis: Other abnormalities of gait and mobility (R26.89)    Time: 8295-6213 PT Time Calculation (min) (ACUTE ONLY): 31 min   Charges:   PT Evaluation $PT Eval Moderate Complexity: 1 Mod PT Treatments $Therapeutic Activity: 8-22 mins PT General Charges $$ ACUTE PT VISIT: 1 Visit         Genasis Zingale B, PT, DPT Acute Rehab Services  [phone]   Gladys Damme 07/07/2023, 11:23 AM

## 2023-07-07 NOTE — Progress Notes (Signed)
PROGRESS NOTE                                                                                                                                                                                                             Patient Demographics:    Leroy Chen, is a 67 y.o. male, DOB - 27-May-1956, NGE:[id number]  Outpatient Primary MD for the patient is [name]    LOS - 4  Admit date - 07/02/2023    Chief Complaint  Patient presents with   Leg Pain       Brief Narrative (HPI from H&P)    67 y.o. male with medical history significant of paroxysmal A-fib status post DCCV in 2021 on Xarelto, history of first and second-degree AV block not requiring pacemaker placement, atrial tachycardia, insulin-dependent type 2 diabetes, CKD stage IIIa, hypertension, hyperlipidemia, former smoker, obesity, hidradenitis suppurativa, depression.  Patient was recently seen in the ED on 06/27/23 for fall and right leg injury.  X-ray of the femur showed no evidence of fracture.  His pain was felt to be related to right quadriceps tendon injury.  He was made nonweightbearing on crutches, knee immobilizer placed, and was discharged with plan for orthopedic follow-up.   Continue to have swelling of his right leg with right thigh pain and discomfort came to the ER where CT scan showed very large amount of fluid collection in his right lower extremity in the thigh area, he was admitted for further care.  Further workup suggest that he might have a very large hematoma in his right thigh due to being on Xarelto and having a soft tissue injury.   Subjective:   Patient in bed, appears comfortable, denies any headache, no fever, no chest pain or pressure, no shortness of breath , no abdominal pain. No focal weakness.  Mild right leg and thigh postoperative discomfort and pain.   Assessment  & Plan :    Right lower extremity soft tissue injury  with fall 1 week ago with right lower extremity swelling and possible secondary cellulitis and foreign body did on CT scan.  He was diagnosed with severe sepsis at the time of admission as well.  I think patient's main issue is right thigh soft tissue hematoma which he incurred due to his severe fall 1 week prior to admission in the setting  of him being on Xarelto, CT scan has been noted.  He is already been placed on antibiotics for possible secondary cellulitis and sepsis this has been clinically ruled out.  Orthopedics was consulted, MRI of the right leg obtained with large quadriceps tear and patellar injury and soft tissue hematoma, case discussed with Dr. Lajoyce Corners on 07/05/2023, stopped antibiotics as this is not sepsis or infection, he underwent surgical correction on 07/07/2023 by Dr. Lajoyce Corners of quadriceps tendon repair.  Tolerated the procedure well.  Per Ortho. Patient may be weightbearing as tolerated with the knee immobilizer. I will follow-up with him in 2 weeks in the office. He can discharge to home as soon as he is safe with therapy.  Initiate PT OT, prophylactic dose heparin on 07/07/2023 if tolerates well than home dose Xarelto on 07/08/2023 and discharged home with home PT OT.   AKI on CKD stage IIIa - Likely prerenal in etiology in the setting of blood loss, with hydration AKI resolved. Improved overall, Should be able to rehydrate orally once no longer NPO after surgery. Will likely get IVF w/surgery as well.   Sinus tachycardia - Due to #1 above supportive care and pain control. Improved.   Normocytic anemia - Hemoglobin currently 11.1, was 16.5 in July 2024.  Patient is chronically anticoagulated although denies any symptoms of GI bleed.  Acute anemia is clearly due to soft tissue hematoma and blood loss in the right lower extremity.  Monitor.  Type screen also done.  No signs of GI bleed continue PPI. - Recheck CBC in AM with T&S   Paroxysmal A-fib status post DCCV in 2021 - History  of atrial tachycardia: Rate is controlled - Continue  metoprolol, and flecainide.   -Start prophylactic heparin on 07/07/2023 if tolerates well then Xarelto on 07/08/2023  Elevated total bilirubin  Due to soft tissue hematoma and reabsorption of blood, monitor. Recheck.   Hypertension  Blood pressure stable.  Continue metoprolol given tachycardia.  Hold losartan in the setting of AKI.   Hyperlipidemia  Continue Crestor.   Depression  Continue Zoloft.   GERD  Continue Protonix.  Mild hyponatremia  Gentle hydration   High anion gap metabolic acidosis  - he takes Jardiance and had developed euglycemic DKA which has resolved after insulin drip, transition to subcu insulin and sliding scale, discontinue Jardiance permanently upon discharge.    Insulin-dependent type 2 diabetes - Continue subcu insulin, will be n.p.o. after midnight on 07/06/2023 monitor cautiously.   Lab Results  Component Value Date   HGBA1C 7.6 (H) 07/03/2023   CBG (last 3)  Recent Labs    07/06/23 1658 07/06/23 2104 07/07/23 0831  GLUCAP 118* 157* 135*         Condition - Fair  Family Communication  : Wife [phone]  on 07/04/2023 at 7:53 AM  Code Status :  Full  Consults  :  Ortho  PUD Prophylaxis : PPI   Procedures  :     MRI.  Right lower extremity.    1. High-grade partial tear of the distal quadriceps tendon, involving the superficial central fibers arising from the rectus femoris tendon as well as some of the medial fibers arising from the vastus medialis. The deep central and lateral fibers appear intact. 2. Large amount of heterogeneous soft tissue swelling anteriorly in the distal thigh, likely representing soft tissue hematoma. 3. Possible partial tear of the medial patellofemoral ligament at its patellar attachment. 4. The menisci, cruciate and collateral ligaments appear intact. 5. No  evidence of acute fracture or osteonecrosis  Lower extremity venous duplex.  No DVT.    CT right hip.   No fracture.    CT scan tibia-fibula right leg.   1. Subcutaneous edema throughout the mid and lower thigh surrounding the knee. No evidence for soft tissue gas or fluid collection. 2. No acute osseous abnormality. 3. Thin linear radiopaque foreign body measuring 1 cm within the lateral subcutaneous tissues of the distal right lower extremity.      Disposition Plan  :    Status is: Inpatient   DVT Prophylaxis  :    SCDs Start: 07/06/23 1801 SCDs Start: 07/04/23 0813 Place and maintain sequential compression device Start: 07/04/23 0718   Lab Results  Component Value Date   PLT 262 07/07/2023    Diet :  Diet Order             Diet Carb Modified Fluid consistency: Thin; Room service appropriate? Yes  Diet effective now                    Inpatient Medications  Scheduled Meds:  docusate sodium  100 mg Oral BID   flecainide  50 mg Oral BID   insulin aspart  0-15 Units Subcutaneous TID WC   insulin aspart  0-5 Units Subcutaneous QHS   insulin glargine-yfgn  25 Units Subcutaneous BID   metoprolol tartrate  50 mg Oral BID   mupirocin ointment  1 Application Nasal BID   pantoprazole  40 mg Oral Daily   rosuvastatin  40 mg Oral Daily   sertraline  100 mg Oral Daily   Continuous Infusions:   PRN Meds:.acetaminophen, albuterol, bisacodyl, dicyclomine, HYDROmorphone (DILAUDID) injection, magnesium citrate, methocarbamol **OR** methocarbamol (ROBAXIN) injection, metoCLOPramide **OR** metoCLOPramide (REGLAN) injection, naLOXone (NARCAN)  injection, ondansetron **OR** ondansetron (ZOFRAN) IV, oxyCODONE, oxyCODONE, polyethylene glycol    Objective:   Vitals:   07/07/23 0500 07/07/23 0600 07/07/23 0800 07/07/23 0801  BP:   (!) 147/66   Pulse: 87 90 90 89  Resp: 14 12 (!) 26 (!) 23  Temp:   98.8 F (37.1 C)   TempSrc:   Oral   SpO2: 96% 97% 98% 97%  Weight:      Height:        Wt Readings from Last 3 Encounters:  07/02/23 113.4 kg  06/27/23 113.4 kg   02/12/23 113 kg     Intake/Output Summary (Last 24 hours) at 07/07/2023 1114 Last data filed at 07/07/2023 0800 Gross per 24 hour  Intake 1500 ml  Output 1100 ml  Net 400 ml     Physical Exam  Awake Alert, No new F.N deficits, Normal affect [place].AT,PERRAL Supple Neck, No JVD,   Symmetrical Chest wall movement, Good air movement bilaterally, CTAB RRR,No Gallops, Rubs or new Murmurs,  +ve B.Sounds, Abd Soft, No tenderness,   Right knee and leg in knee immobilizer   Data Review:    Recent Labs  Lab 07/02/23 1336 07/02/23 1349 07/03/23 0406 07/04/23 0834 07/05/23 0459 07/06/23 0237 07/07/23 0500  WBC 14.3*  --  9.7 7.8 8.3 7.6 8.1  HGB 11.1*   < > 9.0* 9.0* 9.0* 8.4* 8.5*  HCT 34.7*   < > 28.8* 28.4* 28.4* 26.0* 26.7*  PLT 262  --  203 214 238 246 262  MCV 95.3  --  98.3 96.9 96.9 95.2 97.8  MCH 30.5  --  30.7 30.7 30.7 30.8 31.1  MCHC 32.0  --  31.3 31.7 31.7 32.3  31.8  RDW 14.5  --  14.8 15.2 15.0 15.5 15.5  LYMPHSABS 2.5  --   --  1.4 1.5 1.6 0.9  MONOABS 1.5*  --   --  1.0 1.2* 1.2* 1.2*  EOSABS 0.0  --   --  0.2 0.1 0.1 0.1  BASOSABS 0.1  --   --  0.1 0.0 0.0 0.0   < > = values in this interval not displayed.    Recent Labs  Lab 07/02/23 1336 07/02/23 1349 07/02/23 2032 07/02/23 2225 07/03/23 0406 07/04/23 0834 07/04/23 1445 07/05/23 0459 07/06/23 0237 07/07/23 0500  NA 134*   < >  --   --  138  --  137 137 137 138  K 4.9   < >  --   --  4.4  --  4.2 4.4 3.6 4.7  CL 100  --   --   --  108  --  108 110 109 105  CO2 13*  --   --   --  15*  --  19* 20* 20* 22  ANIONGAP 21*  --   --   --  15  --  10 7 8 11   GLUCOSE 234*  --   --   --  181*  --  132* 118* 132* 157*  BUN 46*  --   --   --  36*  --  24* 17 19 18   CREATININE 2.01*  --   --   --  1.85*  --  1.32* 1.22 1.47* 1.21  AST 22  --   --   --  20  --   --   --   --   --   ALT 16  --   --   --  15  --   --   --   --   --   ALKPHOS 50  --   --   --  42  --   --   --   --   --   BILITOT 3.2*  --    --   --  2.8*  --   --   --   --   --   ALBUMIN 3.3*  --   --   --  2.7*  --   --   --   --   --   CRP  --   --   --   --   --  7.4*  --  9.5* 12.9* 17.7*  PROCALCITON  --   --   --   --   --  0.12  --  0.11 0.13 0.14  LATICACIDVEN  --   --  2.5* 3.2* 1.3  --   --   --   --   --   INR  --   --   --   --   --  1.5*  --   --   --   --   TSH  --   --   --   --  1.210  --   --   --   --   --   HGBA1C  --   --   --   --  7.6*  --   --   --   --   --   BNP  --   --   --   --   --  353.6*  --  348.2* 98.5 74.8  MG  --   --   --   --  2.3 2.3  --  2.3 2.3 2.2  CALCIUM 9.4  --   --   --  7.9*  --  8.0* 7.9* 8.1* 8.6*   < > = values in this interval not displayed.      Recent Labs  Lab 07/02/23 2032 07/02/23 2225 07/03/23 0406 07/04/23 0834 07/04/23 1445 07/05/23 0459 07/06/23 0237 07/07/23 0500  CRP  --   --   --  7.4*  --  9.5* 12.9* 17.7*  PROCALCITON  --   --   --  0.12  --  0.11 0.13 0.14  LATICACIDVEN 2.5* 3.2* 1.3  --   --   --   --   --   INR  --   --   --  1.5*  --   --   --   --   TSH  --   --  1.210  --   --   --   --   --   HGBA1C  --   --  7.6*  --   --   --   --   --   BNP  --   --   --  353.6*  --  348.2* 98.5 74.8  MG  --   --  2.3 2.3  --  2.3 2.3 2.2  CALCIUM  --   --  7.9*  --  8.0* 7.9* 8.1* 8.6*    --------------------------------------------------------------------------------------------------------------- No results found for: "CHOL", "HDL", "LDLCALC", "LDLDIRECT", "TRIG", "CHOLHDL"  Lab Results  Component Value Date   HGBA1C 7.6 (H) 07/03/2023   No results for input(s): "TSH", "T4TOTAL", "FREET4", "T3FREE", "THYROIDAB" in the last 72 hours.  No results for input(s): "VITAMINB12", "FOLATE", "FERRITIN", "TIBC", "IRON", "RETICCTPCT" in the last 72 hours.  ------------------------------------------------------------------------------------------------------------------ Cardiac Enzymes No results for input(s): "CKMB", "TROPONINI", "MYOGLOBIN" in the last 168  hours.  Invalid input(s): "CK"  Micro Results Recent Results (from the past 240 hours)  Resp panel by RT-PCR (RSV, Flu A&B, Covid) Anterior Nasal Swab     Status: None   Collection Time: 07/02/23  4:59 PM   Specimen: Anterior Nasal Swab  Result Value Ref Range Status   SARS Coronavirus 2 by RT PCR NEGATIVE NEGATIVE Final   Influenza A by PCR NEGATIVE NEGATIVE Final   Influenza B by PCR NEGATIVE NEGATIVE Final    Comment: (NOTE) The Xpert Xpress SARS-CoV-2/FLU/RSV plus assay is intended as an aid in the diagnosis of influenza from Nasopharyngeal swab specimens and should not be used as a sole basis for treatment. Nasal washings and aspirates are unacceptable for Xpert Xpress SARS-CoV-2/FLU/RSV testing.  Fact Sheet for Patients: BloggerCourse.com  Fact Sheet for Healthcare Providers: SeriousBroker.it  This test is not yet approved or cleared by the [place] FDA and has been authorized for detection and/or diagnosis of SARS-CoV-2 by FDA under an Emergency Use Authorization (EUA). This EUA will remain in effect (meaning this test can be used) for the duration of the COVID-19 declaration under Section 564(b)(1) of the Act, 21 U.S.C. section 360bbb-3(b)(1), unless the authorization is terminated or revoked.     Resp Syncytial Virus by PCR NEGATIVE NEGATIVE Final    Comment: (NOTE) Fact Sheet for Patients: BloggerCourse.com  Fact Sheet for Healthcare Providers: SeriousBroker.it  This test is not yet approved or cleared by the [place] FDA and has been authorized for detection and/or diagnosis of SARS-CoV-2 by FDA under an Emergency Use Authorization (EUA). This EUA will remain in effect (meaning this test can be used)  for the duration of the COVID-19 declaration under Section 564(b)(1) of the Act, 21 U.S.C. section 360bbb-3(b)(1), unless the authorization is terminated  or revoked.  Performed at [facility], [address] [place], [place] [address]   Blood culture (routine x 2)     Status: None   Collection Time: 07/02/23  8:24 PM   Specimen: BLOOD RIGHT HAND  Result Value Ref Range Status   Specimen Description BLOOD RIGHT HAND  Final   Special Requests   Final    BOTTLES DRAWN AEROBIC ONLY Blood Culture results may not be optimal due to an inadequate volume of blood received in culture bottles   Culture   Final    NO GROWTH 5 DAYS Performed at [facility], [address] [place], [place] [address]    Report Status 07/07/2023 FINAL  Final  Blood culture (routine x 2)     Status: None   Collection Time: 07/02/23  8:35 PM   Specimen: BLOOD  Result Value Ref Range Status   Specimen Description BLOOD SITE NOT SPECIFIED  Final   Special Requests   Final    BOTTLES DRAWN AEROBIC ONLY Blood Culture results may not be optimal due to an inadequate volume of blood received in culture bottles   Culture   Final    NO GROWTH 5 DAYS Performed at [facility], [address] [place], [place] [address]    Report Status 07/07/2023 FINAL  Final  MRSA Next Gen by PCR, Nasal     Status: None   Collection Time: 07/04/23 11:24 AM   Specimen: Nasal Mucosa; Nasal Swab  Result Value Ref Range Status   MRSA by PCR Next Gen NOT DETECTED NOT DETECTED Final    Comment: (NOTE) The GeneXpert MRSA Assay (FDA approved for NASAL specimens only), is one component of a comprehensive MRSA colonization surveillance program. It is not intended to diagnose MRSA infection nor to guide or monitor treatment for MRSA infections. Test performance is not FDA approved in patients less than 31 years old. Performed at [facility], [address] [place], [place] [address]     Radiology Reports MR KNEE RIGHT WO CONTRAST Result Date: 07/04/2023 CLINICAL DATA:  Knee trauma, internal derangement suspected. Reported fall 1 week ago. EXAM: MRI OF THE  RIGHT KNEE WITHOUT CONTRAST TECHNIQUE: Multiplanar, multisequence MR imaging of the knee was performed. No intravenous contrast was administered. COMPARISON:  Radiographs 06/27/2023. CT of the right femur 07/02/2023. FINDINGS: MENISCI Medial meniscus:  Intact with normal morphology. Lateral meniscus:  Intact with normal morphology. LIGAMENTS Cruciates: The anterior cruciate ligament appears mildly thickened and ill-defined without definite discontinuity, findings favored to reflect mucoid degeneration. The PCL appears normal. Collaterals: The medial and lateral collateral ligament complexes are intact. CARTILAGE Patellofemoral: Mild fissuring of the patellar cartilage at the apex, best seen on the sagittal images. No subchondral signal abnormality. Medial:  No focal chondral defect. Lateral:  No focal chondral defect. MISCELLANEOUS Joint: Small joint effusion without lipohemarthrosis or intra-articular loose body. Popliteal Fossa: The popliteus muscle and tendon are intact. Small Baker's cyst. Extensor Mechanism: There is a large amount of heterogeneous soft tissue swelling anteriorly in the distal thigh which likely represents soft tissue hematoma. There is evidence of an underlying high-grade partial tear of the distal quadriceps tendon, involving the superficial central fibers arising from the rectus femoris tendon as well as some of the medial fibers arising from the vastus medialis. The deep central and  lateral fibers appear intact. The patellar tendon appears lax, but intact. Possible partial tear of the medial patellofemoral ligament at its patellar attachment. Bones:  No evidence of acute fracture, dislocation or osteonecrosis. Other: Diffuse soft tissue swelling around the knee progressive from recent CT. As above, there is heterogeneous probable hemorrhage around the distal quadriceps tendon, incompletely visualized. IMPRESSION: 1. High-grade partial tear of the distal quadriceps tendon, involving the  superficial central fibers arising from the rectus femoris tendon as well as some of the medial fibers arising from the vastus medialis. The deep central and lateral fibers appear intact. 2. Large amount of heterogeneous soft tissue swelling anteriorly in the distal thigh, likely representing soft tissue hematoma. 3. Possible partial tear of the medial patellofemoral ligament at its patellar attachment. 4. The menisci, cruciate and collateral ligaments appear intact. 5. No evidence of acute fracture or osteonecrosis. Electronically Signed   By: Carey Bullocks M.D.   On: 07/04/2023 17:11      Signature  -   Susa Raring M.D on 07/07/2023 at 11:14 AM   -  To page go to [email or web address]

## 2023-07-07 NOTE — Anesthesia Postprocedure Evaluation (Deleted)
Anesthesia Post Note  Patient: Leroy Chen  Procedure(s) Performed: REPAIR RIGHT QUADRICEP TENDON (Right: Thigh)     Patient location during evaluation: PACU Anesthesia Type: General Level of consciousness: awake and alert Pain management: pain level controlled Vital Signs Assessment: post-procedure vital signs reviewed and stable Respiratory status: spontaneous breathing, nonlabored ventilation, respiratory function stable and patient connected to nasal cannula oxygen Cardiovascular status: blood pressure returned to baseline and stable Postop Assessment: no apparent nausea or vomiting Anesthetic complications: no  No notable events documented.  Last Vitals:  Vitals:   07/07/23 0800 07/07/23 0801  BP: (!) 147/66   Pulse: 90 89  Resp: (!) 26 (!) 23  Temp: 37.1 C   SpO2: 98% 97%    Last Pain:  Vitals:   07/07/23 0803  TempSrc:   PainSc: 8    Pain Goal: Patients Stated Pain Goal: 0 (07/03/23 2201)                 Jahmez Bily L Ehan Freas

## 2023-07-07 NOTE — Progress Notes (Signed)
Patient ID: Leroy Chen, male   DOB: 1955-11-26, 67 y.o.   MRN: [id number] Patient is postoperative day 1 quad tendon repair.  His leg is straight in the immobilizer.  Patient may be weightbearing as tolerated with the knee immobilizer.  I will follow-up with him in 2 weeks in the office.  He can discharge to home as soon as he is safe with therapy.

## 2023-07-07 NOTE — Progress Notes (Signed)
Occupational Therapy Treatment Patient Details Name: Leroy Chen MRN: [id number] DOB: 04-Jun-1956 Today's Date: 07/07/2023   History of present illness 67 y.o. male presenting to [facility] on 07/02/23 for RLE pain and swelling after a fall awaiting quadriceps tendon surgery. Patient was recently seen in the ED on 06/27/23 for fall and right leg injury.  X-ray of the femur showed no evidence of fracture.  His pain was felt to be related to right quadriceps tendon injury.  He was made nonweightbearing on crutches, knee immobilizer placed. Past medical history significant of paroxysmal A-fib status post DCCV in 2021 on Xarelto, history of first and second-degree AV block not requiring pacemaker placement, atrial tachycardia, insulin-dependent type 2 diabetes, CKD stage IIIa, hypertension, hyperlipidemia, former smoker, obesity, hidradenitis suppurativa, depression. Pt is now s/p quad tendon repair on 12/20.   OT comments  Pt progressing well with putting weight through RLE but his BP seems to continue to drop while standing. Pt needing min A to transfer via pivots due to unsteadiness. He does very well with compensatory strategies for repositioning his RLE without AROM of quads. OT to continue to progress pt as able, DC plans will be left as no follow-up for now pending progression.       If plan is discharge home, recommend the following:      Equipment Recommendations  Other (comment) (RW)    Recommendations for Other Services      Precautions / Restrictions Precautions Precautions: Fall Required Braces or Orthoses: Knee Immobilizer - Right Knee Immobilizer - Right: On at all times Restrictions Weight Bearing Restrictions Per Provider Order: Yes RLE Weight Bearing Per Provider Order: Weight bearing as tolerated Other Position/Activity Restrictions: WBAT RLE in Knee immobilizer       Mobility Bed Mobility Overal bed mobility: Needs Assistance Bed Mobility: Supine to Sit     Supine to  sit: Contact guard, Used rails, HOB elevated     General bed mobility comments: cue to use bed rails    Transfers Overall transfer level: Needs assistance Equipment used: Rolling walker (2 wheels) Transfers: Sit to/from Stand, Bed to chair/wheelchair/BSC Sit to Stand: Min assist     Step pivot transfers: Min assist     General transfer comment: Min A to stedy then min A needed to pivot to recliner due to dizziness and unsteadiness     Balance Overall balance assessment: Needs assistance Sitting-balance support: Feet supported, No upper extremity supported Sitting balance-Leahy Scale: Fair Sitting balance - Comments: sitting EOB   Standing balance support: Bilateral upper extremity supported, During functional activity, Reliant on assistive device for balance Standing balance-Leahy Scale: Poor Standing balance comment: reliant on RW support                           ADL either performed or assessed with clinical judgement   ADL       Grooming: Set up;Bed level;Oral care Grooming Details (indicate cue type and reason): wife provided pt with materials Upper Body Bathing: With caregiver independent assisting;Bed level Upper Body Bathing Details (indicate cue type and reason): Spouse assisted pt with bathing just prior to OT walking in room Lower Body Bathing: With caregiver independent assisting;Bed level Lower Body Bathing Details (indicate cue type and reason): Spouse assisted pt with bathing just prior to OT walking in room                     Functional mobility during ADLs:  Minimal assistance;Rolling walker (2 wheels)      Extremity/Trunk Assessment              Vision       Perception     Praxis      Cognition Arousal: Alert Behavior During Therapy: WFL for tasks assessed/performed Overall Cognitive Status: Within Functional Limits for tasks assessed                                          Exercises       Shoulder Instructions       General Comments Bp seated EOB 119/50(69), unable to get one in standing as BP monitor began to rest and pt dizziness was not improving, 11753(71) semi-reclined in recliner with feet up    Pertinent Vitals/ Pain       Pain Assessment Pain Assessment: Faces Faces Pain Scale: Hurts even more Pain Location: R patella thoughout quad Pain Descriptors / Indicators: Aching, Discomfort, Guarding, Grimacing Pain Intervention(s): Limited activity within patient's tolerance, Monitored during session  Home Living                                          Prior Functioning/Environment              Frequency  Min 1X/week        Progress Toward Goals  OT Goals(current goals can now be found in the care plan section)  Progress towards OT goals: Progressing toward goals  Acute Rehab OT Goals Patient Stated Goal: To get better OT Goal Formulation: With patient Time For Goal Achievement: 07/19/23 Potential to Achieve Goals: Good  Plan      Co-evaluation                 AM-PAC OT "6 Clicks" Daily Activity     Outcome Measure   Help from another person eating meals?: None Help from another person taking care of personal grooming?: A Little Help from another person toileting, which includes using toliet, bedpan, or urinal?: A Lot Help from another person bathing (including washing, rinsing, drying)?: A Lot Help from another person to put on and taking off regular upper body clothing?: A Little Help from another person to put on and taking off regular lower body clothing?: Total 6 Click Score: 15    End of Session Equipment Utilized During Treatment: Gait belt;Rolling walker (2 wheels)  OT Visit Diagnosis: Unsteadiness on feet (R26.81);Other abnormalities of gait and mobility (R26.89);Pain Pain - Right/Left: Right Pain - part of body: Leg   Activity Tolerance Patient tolerated treatment well   Patient Left with call  bell/phone within reach;in chair;with family/visitor present   Nurse Communication Mobility status        Time: 1415-1450 OT Time Calculation (min): 35 min  Charges: OT General Charges $OT Visit: 1 Visit OT Treatments $Therapeutic Activity: 23-37 mins  07/07/2023  AB, OTR/L  Acute Rehabilitation Services  Office: [phone]   Leroy Chen 07/07/2023, 6:15 PM

## 2023-07-08 ENCOUNTER — Encounter (HOSPITAL_COMMUNITY): Payer: Self-pay | Admitting: Orthopedic Surgery

## 2023-07-08 DIAGNOSIS — L03115 Cellulitis of right lower limb: Secondary | ICD-10-CM | POA: Diagnosis not present

## 2023-07-08 LAB — CBC WITH DIFFERENTIAL/PLATELET
Abs Immature Granulocytes: 0.07 10*3/uL (ref 0.00–0.07)
Basophils Absolute: 0 10*3/uL (ref 0.0–0.1)
Basophils Relative: 0 %
Eosinophils Absolute: 0.1 10*3/uL (ref 0.0–0.5)
Eosinophils Relative: 1 %
HCT: 26.8 % — ABNORMAL LOW (ref 39.0–52.0)
Hemoglobin: 8.3 g/dL — ABNORMAL LOW (ref 13.0–17.0)
Immature Granulocytes: 1 %
Lymphocytes Relative: 13 %
Lymphs Abs: 1.1 10*3/uL (ref 0.7–4.0)
MCH: 30.6 pg (ref 26.0–34.0)
MCHC: 31 g/dL (ref 30.0–36.0)
MCV: 98.9 fL (ref 80.0–100.0)
Monocytes Absolute: 1.6 10*3/uL — ABNORMAL HIGH (ref 0.1–1.0)
Monocytes Relative: 18 %
Neutro Abs: 5.6 10*3/uL (ref 1.7–7.7)
Neutrophils Relative %: 67 %
Platelets: 262 10*3/uL (ref 150–400)
RBC: 2.71 MIL/uL — ABNORMAL LOW (ref 4.22–5.81)
RDW: 15.2 % (ref 11.5–15.5)
WBC: 8.5 10*3/uL (ref 4.0–10.5)
nRBC: 0.6 % — ABNORMAL HIGH (ref 0.0–0.2)

## 2023-07-08 LAB — GLUCOSE, CAPILLARY
Glucose-Capillary: 140 mg/dL — ABNORMAL HIGH (ref 70–99)
Glucose-Capillary: 202 mg/dL — ABNORMAL HIGH (ref 70–99)
Glucose-Capillary: 200 mg/dL — ABNORMAL HIGH (ref 70–99)
Glucose-Capillary: 149 mg/dL — ABNORMAL HIGH (ref 70–99)

## 2023-07-08 LAB — BRAIN NATRIURETIC PEPTIDE: B Natriuretic Peptide: 90.7 pg/mL (ref 0.0–100.0)

## 2023-07-08 LAB — BASIC METABOLIC PANEL
Anion gap: 7 (ref 5–15)
BUN: 19 mg/dL (ref 8–23)
CO2: 25 mmol/L (ref 22–32)
Calcium: 8.6 mg/dL — ABNORMAL LOW (ref 8.9–10.3)
Chloride: 106 mmol/L (ref 98–111)
Creatinine, Ser: 1.84 mg/dL — ABNORMAL HIGH (ref 0.61–1.24)
GFR, Estimated: 40 mL/min — ABNORMAL LOW (ref >60)
Glucose, Bld: 162 mg/dL — ABNORMAL HIGH (ref 70–99)
Potassium: 4.8 mmol/L (ref 3.5–5.1)
Sodium: 138 mmol/L (ref 135–145)

## 2023-07-08 LAB — PHOSPHORUS: Phosphorus: 2.5 mg/dL (ref 2.5–4.6)

## 2023-07-08 LAB — C-REACTIVE PROTEIN: CRP: 24.6 mg/dL — ABNORMAL HIGH (ref <1.0)

## 2023-07-08 LAB — PROCALCITONIN: Procalcitonin: 0.47 ng/mL

## 2023-07-08 LAB — MAGNESIUM: Magnesium: 2.2 mg/dL (ref 1.7–2.4)

## 2023-07-08 MED ORDER — METOPROLOL TARTRATE 25 MG PO TABS
25.0000 mg | ORAL_TABLET | Freq: Two times a day (BID) | ORAL | Status: DC
  Administered 2023-07-09 – 2023-07-16 (×15): 25 mg via ORAL
  Filled 2023-07-08 (×16): qty 1

## 2023-07-08 MED ORDER — LACTATED RINGERS IV SOLN: INTRAVENOUS | Status: AC

## 2023-07-08 MED ORDER — RIVAROXABAN 20 MG PO TABS
20.0000 mg | ORAL_TABLET | Freq: Every day | ORAL | Status: DC
  Administered 2023-07-08 – 2023-07-09 (×2): 20 mg via ORAL
  Filled 2023-07-08 (×2): qty 1

## 2023-07-08 NOTE — Plan of Care (Signed)
  Problem: Coping: Goal: Ability to adjust to condition or change in health will improve Outcome: Progressing   

## 2023-07-08 NOTE — Progress Notes (Signed)
PROGRESS NOTE                                                                                                                                                                                                             Patient Demographics:    Leroy Chen, is a 68 y.o. male, DOB - 02-Jun-1956, ZOX:[id number]  Outpatient Primary MD for the patient is [name]    LOS - 5  Admit date - 07/02/2023    Chief Complaint  Patient presents with   Leg Pain       Brief Narrative (HPI from H&P)    67 y.o. male with medical history significant of paroxysmal A-fib status post DCCV in 2021 on Xarelto, history of first and second-degree AV block not requiring pacemaker placement, atrial tachycardia, insulin-dependent type 2 diabetes, CKD stage IIIa, hypertension, hyperlipidemia, former smoker, obesity, hidradenitis suppurativa, depression.  Patient was recently seen in the ED on 06/27/23 for fall and right leg injury.  X-ray of the femur showed no evidence of fracture.  His pain was felt to be related to right quadriceps tendon injury.  He was made nonweightbearing on crutches, knee immobilizer placed, and was discharged with plan for orthopedic follow-up.   Continue to have swelling of his right leg with right thigh pain and discomfort came to the ER where CT scan showed very large amount of fluid collection in his right lower extremity in the thigh area, he was admitted for further care.  Further workup suggest that he might have a very large hematoma in his right thigh due to being on Xarelto and having a soft tissue injury.   Subjective:   Patient in bed, appears comfortable, denies any headache, no fever, no chest pain or pressure, no shortness of breath , no abdominal pain. No new focal weakness. Mild right leg and thigh postoperative discomfort and pain.   Assessment  & Plan :    Right lower extremity soft tissue injury  with fall 1 week ago with right lower extremity swelling and possible secondary cellulitis and foreign body did on CT scan.  He was diagnosed with severe sepsis at the time of admission as well.  I think patient's main issue is right thigh soft tissue hematoma which he incurred due to his severe fall 1 week prior to admission in the setting  of him being on Xarelto, CT scan has been noted.  He is already been placed on antibiotics for possible secondary cellulitis and sepsis this has been clinically ruled out.  Orthopedics was consulted, MRI of the right leg obtained with large quadriceps tear and patellar injury and soft tissue hematoma, case discussed with Dr. Lajoyce Corners on 07/05/2023, stopped antibiotics as this is not sepsis or infection, he underwent surgical correction on 07/07/2023 by Dr. Lajoyce Corners of quadriceps tendon repair.  Tolerated the procedure well.  Per Ortho. Patient may be weightbearing as tolerated with the knee immobilizer. I will follow-up with him in 2 weeks in the office. He can discharge to home as soon as he is safe with therapy.  Initiate PT OT, prophylactic dose heparin on 07/07/2023 if tolerates well than home dose Xarelto on 07/08/2023 and discharged home with home PT OT.   Sinus tachycardia - Due to #1 above supportive care and pain control. Improved.   Normocytic anemia - Hemoglobin currently 11.1, was 16.5 in July 2024.  Patient is chronically anticoagulated although denies any symptoms of GI bleed.  Acute anemia is clearly due to soft tissue hematoma and blood loss in the right lower extremity.  Monitor.  Type screen also done.  No signs of GI bleed continue PPI.  No further signs of bleeding postop, has tolerated prophylactic heparin well on 07/08/2023, we do expect CBC to fall a little bit on 07/09/2023 from the fluids he is going to get on 07/08/2023.    AKI on CKD stage IIIa - Likely prerenal in etiology in the setting of blood loss, hydrate on 07/08/2023 and monitor.  Expect  some drop in H&H due to heme dilution.  Paroxysmal A-fib status post DCCV in 2021 - History of atrial tachycardia: Rate is controlled  - Continue  metoprolol, and flecainide.  Started prophylactic heparin on 07/07/2023 has tolerated it well will switch to Xarelto at his home dose on 07/08/2023. Elevated total bilirubin  Due to soft tissue hematoma and reabsorption of blood, monitor. Recheck.   Hypertension  Blood pressure only on the soft side continue to hold ARB, reduce beta-blocker and hydrate on 07/08/2023.   Hyperlipidemia  Continue Crestor.   Depression  Continue Zoloft.   GERD  Continue Protonix.  Mild hyponatremia  Gentle hydration   High anion gap metabolic acidosis  - he takes Jardiance and had developed euglycemic DKA which has resolved after insulin drip, transition to subcu insulin and sliding scale, discontinue Jardiance permanently upon discharge.    Insulin-dependent type 2 diabetes - Continue subcu insulin, will be n.p.o. after midnight on 07/06/2023 monitor cautiously.   Lab Results  Component Value Date   HGBA1C 7.6 (H) 07/03/2023   CBG (last 3)  Recent Labs    07/07/23 1622 07/07/23 2108 07/08/23 0752  GLUCAP 173* 157* 140*         Condition - Fair  Family Communication  : Wife [phone]  on 07/04/2023 at 7:53 AM, 07/08/2023  Code Status :  Full  Consults  :  Ortho  PUD Prophylaxis : PPI   Procedures  :     MRI.  Right lower extremity.    1. High-grade partial tear of the distal quadriceps tendon, involving the superficial central fibers arising from the rectus femoris tendon as well as some of the medial fibers arising from the vastus medialis. The deep central and lateral fibers appear intact. 2. Large amount of heterogeneous soft tissue swelling anteriorly in the distal thigh, likely representing soft  tissue hematoma. 3. Possible partial tear of the medial patellofemoral ligament at its patellar attachment. 4. The menisci, cruciate and  collateral ligaments appear intact. 5. No evidence of acute fracture or osteonecrosis  Lower extremity venous duplex.  No DVT.    CT right hip.  No fracture.    CT scan tibia-fibula right leg.   1. Subcutaneous edema throughout the mid and lower thigh surrounding the knee. No evidence for soft tissue gas or fluid collection. 2. No acute osseous abnormality. 3. Thin linear radiopaque foreign body measuring 1 cm within the lateral subcutaneous tissues of the distal right lower extremity.      Disposition Plan  :    Status is: Inpatient   DVT Prophylaxis  :    heparin injection 5,000 Units Start: 07/07/23 1400 SCDs Start: 07/06/23 1801 SCDs Start: 07/04/23 0813 Place and maintain sequential compression device Start: 07/04/23 0718   Lab Results  Component Value Date   PLT 262 07/08/2023    Diet :  Diet Order             Diet Carb Modified Fluid consistency: Thin; Room service appropriate? Yes  Diet effective now                    Inpatient Medications  Scheduled Meds:  docusate sodium  100 mg Oral BID   flecainide  50 mg Oral BID   heparin injection (subcutaneous)  5,000 Units Subcutaneous Q8H   insulin aspart  0-15 Units Subcutaneous TID WC   insulin aspart  0-5 Units Subcutaneous QHS   insulin glargine-yfgn  25 Units Subcutaneous BID   metoprolol tartrate  50 mg Oral BID   mupirocin ointment  1 Application Nasal BID   pantoprazole  40 mg Oral Daily   rosuvastatin  40 mg Oral Daily   sertraline  100 mg Oral Daily   Continuous Infusions:  lactated ringers 100 mL/hr at 07/08/23 0820    PRN Meds:.acetaminophen, albuterol, bisacodyl, dicyclomine, HYDROmorphone (DILAUDID) injection, magnesium citrate, methocarbamol **OR** [DISCONTINUED] methocarbamol (ROBAXIN) injection, metoCLOPramide **OR** [DISCONTINUED] metoCLOPramide (REGLAN) injection, naLOXone (NARCAN)  injection, [DISCONTINUED] ondansetron **OR** ondansetron (ZOFRAN) IV, oxyCODONE, polyethylene  glycol    Objective:   Vitals:   07/08/23 0000 07/08/23 0336 07/08/23 0753 07/08/23 0800  BP: 137/64 114/62 136/65 132/64  Pulse:  92  93  Resp:  19 18 16   Temp:  (!) 97.5 F (36.4 C) 98.2 F (36.8 C) 98.2 F (36.8 C)  TempSrc:  Oral Oral Oral  SpO2:  100%  100%  Weight:      Height:        Wt Readings from Last 3 Encounters:  07/02/23 113.4 kg  06/27/23 113.4 kg  02/12/23 113 kg     Intake/Output Summary (Last 24 hours) at 07/08/2023 0923 Last data filed at 07/08/2023 2130 Gross per 24 hour  Intake 820 ml  Output 750 ml  Net 70 ml     Physical Exam  Awake Alert, No new F.N deficits, Normal affect [place].AT,PERRAL Supple Neck, No JVD,   Symmetrical Chest wall movement, Good air movement bilaterally, CTAB RRR,No Gallops, Rubs or new Murmurs,  +ve B.Sounds, Abd Soft, No tenderness,   Right knee and leg in knee immobilizer   Data Review:    Recent Labs  Lab 07/04/23 0834 07/05/23 0459 07/06/23 0237 07/07/23 0500 07/08/23 0439  WBC 7.8 8.3 7.6 8.1 8.5  HGB 9.0* 9.0* 8.4* 8.5* 8.3*  HCT 28.4* 28.4* 26.0* 26.7* 26.8*  PLT 214  238 246 262 262  MCV 96.9 96.9 95.2 97.8 98.9  MCH 30.7 30.7 30.8 31.1 30.6  MCHC 31.7 31.7 32.3 31.8 31.0  RDW 15.2 15.0 15.5 15.5 15.2  LYMPHSABS 1.4 1.5 1.6 0.9 1.1  MONOABS 1.0 1.2* 1.2* 1.2* 1.6*  EOSABS 0.2 0.1 0.1 0.1 0.1  BASOSABS 0.1 0.0 0.0 0.0 0.0    Recent Labs  Lab 07/02/23 1336 07/02/23 1336 07/02/23 1349 07/02/23 2032 07/02/23 2225 07/03/23 0406 07/04/23 0834 07/04/23 1445 07/05/23 0459 07/06/23 0237 07/07/23 0500 07/08/23 0439  NA 134*  --    < >  --   --  138  --  137 137 137 138 138  K 4.9  --    < >  --   --  4.4  --  4.2 4.4 3.6 4.7 4.8  CL 100  --   --   --   --  108  --  108 110 109 105 106  CO2 13*  --   --   --   --  15*  --  19* 20* 20* 22 25  ANIONGAP 21*  --   --   --   --  15  --  10 7 8 11 7   GLUCOSE 234*  --   --   --   --  181*  --  132* 118* 132* 157* 162*  BUN 46*  --   --   --   --   36*  --  24* 17 19 18 19   CREATININE 2.01*  --   --   --   --  1.85*  --  1.32* 1.22 1.47* 1.21 1.84*  AST 22  --   --   --   --  20  --   --   --   --   --   --   ALT 16  --   --   --   --  15  --   --   --   --   --   --   ALKPHOS 50  --   --   --   --  42  --   --   --   --   --   --   BILITOT 3.2*  --   --   --   --  2.8*  --   --   --   --   --   --   ALBUMIN 3.3*  --   --   --   --  2.7*  --   --   --   --   --   --   CRP  --   --   --   --   --   --  7.4*  --  9.5* 12.9* 17.7* 24.6*  PROCALCITON  --   --   --   --   --   --  0.12  --  0.11 0.13 0.14 0.47  LATICACIDVEN  --   --   --  2.5* 3.2* 1.3  --   --   --   --   --   --   INR  --   --   --   --   --   --  1.5*  --   --   --   --   --   TSH  --   --   --   --   --  1.210  --   --   --   --   --   --  HGBA1C  --   --   --   --   --  7.6*  --   --   --   --   --   --   BNP  --   --   --   --   --   --  353.6*  --  348.2* 98.5 74.8 90.7  MG  --    < >  --   --   --  2.3 2.3  --  2.3 2.3 2.2 2.2  CALCIUM 9.4  --   --   --   --  7.9*  --  8.0* 7.9* 8.1* 8.6* 8.6*   < > = values in this interval not displayed.      Recent Labs  Lab 07/02/23 1336 07/02/23 2032 07/02/23 2225 07/03/23 0406 07/04/23 0834 07/04/23 1445 07/05/23 0459 07/06/23 0237 07/07/23 0500 07/08/23 0439  CRP  --   --   --   --  7.4*  --  9.5* 12.9* 17.7* 24.6*  PROCALCITON  --   --   --   --  0.12  --  0.11 0.13 0.14 0.47  LATICACIDVEN  --  2.5* 3.2* 1.3  --   --   --   --   --   --   INR  --   --   --   --  1.5*  --   --   --   --   --   TSH  --   --   --  1.210  --   --   --   --   --   --   HGBA1C  --   --   --  7.6*  --   --   --   --   --   --   BNP  --   --   --   --  353.6*  --  348.2* 98.5 74.8 90.7  MG   < >  --   --  2.3 2.3  --  2.3 2.3 2.2 2.2  CALCIUM  --   --   --  7.9*  --  8.0* 7.9* 8.1* 8.6* 8.6*   < > = values in this interval not displayed.     --------------------------------------------------------------------------------------------------------------- No results found for: "CHOL", "HDL", "LDLCALC", "LDLDIRECT", "TRIG", "CHOLHDL"  Lab Results  Component Value Date   HGBA1C 7.6 (H) 07/03/2023   No results for input(s): "TSH", "T4TOTAL", "FREET4", "T3FREE", "THYROIDAB" in the last 72 hours.  No results for input(s): "VITAMINB12", "FOLATE", "FERRITIN", "TIBC", "IRON", "RETICCTPCT" in the last 72 hours.  ------------------------------------------------------------------------------------------------------------------ Cardiac Enzymes No results for input(s): "CKMB", "TROPONINI", "MYOGLOBIN" in the last 168 hours.  Invalid input(s): "CK"  Micro Results Recent Results (from the past 240 hours)  Resp panel by RT-PCR (RSV, Flu A&B, Covid) Anterior Nasal Swab     Status: None   Collection Time: 07/02/23  4:59 PM   Specimen: Anterior Nasal Swab  Result Value Ref Range Status   SARS Coronavirus 2 by RT PCR NEGATIVE NEGATIVE Final   Influenza A by PCR NEGATIVE NEGATIVE Final   Influenza B by PCR NEGATIVE NEGATIVE Final    Comment: (NOTE) The Xpert Xpress SARS-CoV-2/FLU/RSV plus assay is intended as an aid in the diagnosis of influenza from Nasopharyngeal swab specimens and should not be used as a sole basis for treatment. Nasal washings and aspirates are unacceptable for Xpert Xpress SARS-CoV-2/FLU/RSV testing.  Fact Sheet for Patients: BloggerCourse.com  Fact  Sheet for Healthcare Providers: SeriousBroker.it  This test is not yet approved or cleared by the [place] and has been authorized for detection and/or diagnosis of SARS-CoV-2 by FDA under an Emergency Use Authorization (EUA). This EUA will remain in effect (meaning this test can be used) for the duration of the COVID-19 declaration under Section 564(b)(1) of the Act, 21 U.S.C. section 360bbb-3(b)(1),  unless the authorization is terminated or revoked.     Resp Syncytial Virus by PCR NEGATIVE NEGATIVE Final    Comment: (NOTE) Fact Sheet for Patients: BloggerCourse.com  Fact Sheet for Healthcare Providers: SeriousBroker.it  This test is not yet approved or cleared by the [place] FDA and has been authorized for detection and/or diagnosis of SARS-CoV-2 by FDA under an Emergency Use Authorization (EUA). This EUA will remain in effect (meaning this test can be used) for the duration of the COVID-19 declaration under Section 564(b)(1) of the Act, 21 U.S.C. section 360bbb-3(b)(1), unless the authorization is terminated or revoked.  Performed at [facility], [address] [place], [place] [address]   Blood culture (routine x 2)     Status: None   Collection Time: 07/02/23  8:24 PM   Specimen: BLOOD RIGHT HAND  Result Value Ref Range Status   Specimen Description BLOOD RIGHT HAND  Final   Special Requests   Final    BOTTLES DRAWN AEROBIC ONLY Blood Culture results may not be optimal due to an inadequate volume of blood received in culture bottles   Culture   Final    NO GROWTH 5 DAYS Performed at [facility], [address] [place], [place] [address]    Report Status 07/07/2023 FINAL  Final  Blood culture (routine x 2)     Status: None   Collection Time: 07/02/23  8:35 PM   Specimen: BLOOD  Result Value Ref Range Status   Specimen Description BLOOD SITE NOT SPECIFIED  Final   Special Requests   Final    BOTTLES DRAWN AEROBIC ONLY Blood Culture results may not be optimal due to an inadequate volume of blood received in culture bottles   Culture   Final    NO GROWTH 5 DAYS Performed at [facility], [address], [place], [place] [address]    Report Status 07/07/2023 FINAL  Final  MRSA Next Gen by PCR, Nasal     Status: None   Collection Time: 07/04/23 11:24 AM   Specimen: Nasal Mucosa; Nasal Swab   Result Value Ref Range Status   MRSA by PCR Next Gen NOT DETECTED NOT DETECTED Final    Comment: (NOTE) The GeneXpert MRSA Assay (FDA approved for NASAL specimens only), is one component of a comprehensive MRSA colonization surveillance program. It is not intended to diagnose MRSA infection nor to guide or monitor treatment for MRSA infections. Test performance is not FDA approved in patients less than 93 years old. Performed at [facility], [address], [place], [place] [address]   Surgical PCR screen     Status: None   Collection Time: 07/06/23  8:23 AM   Specimen: Nasal Mucosa; Nasal Swab  Result Value Ref Range Status   MRSA, PCR NEGATIVE NEGATIVE Final   Staphylococcus aureus NEGATIVE NEGATIVE Final    Comment: (NOTE) The Xpert SA Assay (FDA approved for NASAL specimens in patients 10 years of age and older), is one component of a comprehensive surveillance program. It is not intended to diagnose infection nor to  guide or monitor treatment. Performed at [facility], [address] [place], [place] [address]     Radiology Reports MR KNEE RIGHT WO CONTRAST Result Date: 07/04/2023 CLINICAL DATA:  Knee trauma, internal derangement suspected. Reported fall 1 week ago. EXAM: MRI OF THE RIGHT KNEE WITHOUT CONTRAST TECHNIQUE: Multiplanar, multisequence MR imaging of the knee was performed. No intravenous contrast was administered. COMPARISON:  Radiographs 06/27/2023. CT of the right femur 07/02/2023. FINDINGS: MENISCI Medial meniscus:  Intact with normal morphology. Lateral meniscus:  Intact with normal morphology. LIGAMENTS Cruciates: The anterior cruciate ligament appears mildly thickened and ill-defined without definite discontinuity, findings favored to reflect mucoid degeneration. The PCL appears normal. Collaterals: The medial and lateral collateral ligament complexes are intact. CARTILAGE Patellofemoral: Mild fissuring of the patellar cartilage at the apex, best  seen on the sagittal images. No subchondral signal abnormality. Medial:  No focal chondral defect. Lateral:  No focal chondral defect. MISCELLANEOUS Joint: Small joint effusion without lipohemarthrosis or intra-articular loose body. Popliteal Fossa: The popliteus muscle and tendon are intact. Small Baker's cyst. Extensor Mechanism: There is a large amount of heterogeneous soft tissue swelling anteriorly in the distal thigh which likely represents soft tissue hematoma. There is evidence of an underlying high-grade partial tear of the distal quadriceps tendon, involving the superficial central fibers arising from the rectus femoris tendon as well as some of the medial fibers arising from the vastus medialis. The deep central and lateral fibers appear intact. The patellar tendon appears lax, but intact. Possible partial tear of the medial patellofemoral ligament at its patellar attachment. Bones:  No evidence of acute fracture, dislocation or osteonecrosis. Other: Diffuse soft tissue swelling around the knee progressive from recent CT. As above, there is heterogeneous probable hemorrhage around the distal quadriceps tendon, incompletely visualized. IMPRESSION: 1. High-grade partial tear of the distal quadriceps tendon, involving the superficial central fibers arising from the rectus femoris tendon as well as some of the medial fibers arising from the vastus medialis. The deep central and lateral fibers appear intact. 2. Large amount of heterogeneous soft tissue swelling anteriorly in the distal thigh, likely representing soft tissue hematoma. 3. Possible partial tear of the medial patellofemoral ligament at its patellar attachment. 4. The menisci, cruciate and collateral ligaments appear intact. 5. No evidence of acute fracture or osteonecrosis. Electronically Signed   By: Carey Bullocks M.D.   On: 07/04/2023 17:11      Signature  -   Susa Raring M.D on 07/08/2023 at 9:23 AM   -  To page go to [email or web address]

## 2023-07-09 DIAGNOSIS — L03115 Cellulitis of right lower limb: Secondary | ICD-10-CM | POA: Diagnosis not present

## 2023-07-09 LAB — CBC WITH DIFFERENTIAL/PLATELET
Abs Immature Granulocytes: 0.09 10*3/uL — ABNORMAL HIGH (ref 0.00–0.07)
Basophils Absolute: 0 10*3/uL (ref 0.0–0.1)
Basophils Relative: 1 %
Eosinophils Absolute: 0.1 10*3/uL (ref 0.0–0.5)
Eosinophils Relative: 2 %
HCT: 25.2 % — ABNORMAL LOW (ref 39.0–52.0)
Hemoglobin: 8 g/dL — ABNORMAL LOW (ref 13.0–17.0)
Immature Granulocytes: 1 %
Lymphocytes Relative: 16 %
Lymphs Abs: 1.1 10*3/uL (ref 0.7–4.0)
MCH: 30.7 pg (ref 26.0–34.0)
MCHC: 31.7 g/dL (ref 30.0–36.0)
MCV: 96.6 fL (ref 80.0–100.0)
Monocytes Absolute: 1.4 10*3/uL — ABNORMAL HIGH (ref 0.1–1.0)
Monocytes Relative: 19 %
Neutro Abs: 4.4 10*3/uL (ref 1.7–7.7)
Neutrophils Relative %: 61 %
Platelets: 311 10*3/uL (ref 150–400)
RBC: 2.61 MIL/uL — ABNORMAL LOW (ref 4.22–5.81)
RDW: 15.1 % (ref 11.5–15.5)
WBC: 7.1 10*3/uL (ref 4.0–10.5)
nRBC: 0.6 % — ABNORMAL HIGH (ref 0.0–0.2)

## 2023-07-09 LAB — GLUCOSE, CAPILLARY
Glucose-Capillary: 155 mg/dL — ABNORMAL HIGH (ref 70–99)
Glucose-Capillary: 127 mg/dL — ABNORMAL HIGH (ref 70–99)
Glucose-Capillary: 171 mg/dL — ABNORMAL HIGH (ref 70–99)
Glucose-Capillary: 182 mg/dL — ABNORMAL HIGH (ref 70–99)

## 2023-07-09 LAB — BASIC METABOLIC PANEL
Anion gap: 7 (ref 5–15)
BUN: 17 mg/dL (ref 8–23)
CO2: 25 mmol/L (ref 22–32)
Calcium: 8.4 mg/dL — ABNORMAL LOW (ref 8.9–10.3)
Chloride: 106 mmol/L (ref 98–111)
Creatinine, Ser: 1.48 mg/dL — ABNORMAL HIGH (ref 0.61–1.24)
GFR, Estimated: 52 mL/min — ABNORMAL LOW (ref >60)
Glucose, Bld: 142 mg/dL — ABNORMAL HIGH (ref 70–99)
Potassium: 3.6 mmol/L (ref 3.5–5.1)
Sodium: 138 mmol/L (ref 135–145)

## 2023-07-09 LAB — TYPE AND SCREEN
ABO/RH(D): A POS
Antibody Screen: NEGATIVE

## 2023-07-09 LAB — PROCALCITONIN: Procalcitonin: 0.35 ng/mL

## 2023-07-09 NOTE — Progress Notes (Addendum)
Occupational Therapy Treatment Patient Details Name: Leroy Chen MRN: [id number] DOB: 29-Jun-1956 Today's Date: 07/09/2023   History of present illness 67 y.o. male presenting to [facility] on 07/02/23 for RLE pain and swelling after a fall awaiting quadriceps tendon surgery. Patient was recently seen in the ED on 06/27/23 for fall and right leg injury.  X-ray of the femur showed no evidence of fracture.  His pain was felt to be related to right quadriceps tendon injury.  He was made nonweightbearing on crutches, knee immobilizer placed. Past medical history significant of paroxysmal A-fib status post DCCV in 2021 on Xarelto, history of first and second-degree AV block not requiring pacemaker placement, atrial tachycardia, insulin-dependent type 2 diabetes, CKD stage IIIa, hypertension, hyperlipidemia, former smoker, obesity, hidradenitis suppurativa, depression. Pt is now s/p quad tendon repair on 12/20.   OT comments  Pt. Seen for skilled OT treatment session.  Pt.s spouse present for session also.  Pt. Able to complete LB dressing bed level for underwear without assistance.  Bed mobility with CGA.  In room ambulation to b.room CGA.  One seated rest break required after c/o feeling "light headed" Seated BP: 120/57 (71).  Ambulated back to recliner CGA once pt. Reported feeling ready to resume ambulation.  Pt. Reports feeling better with his weight bearing through RLE during ambulation today.  Cont. With acute OT POC.          If plan is discharge home, recommend the following:      Equipment Recommendations       Recommendations for Other Services      Precautions / Restrictions Precautions Precautions: Fall Required Braces or Orthoses: Knee Immobilizer - Right Knee Immobilizer - Right: On at all times Restrictions RLE Weight Bearing Per Provider Order: Weight bearing as tolerated Other Position/Activity Restrictions: WBAT RLE in Knee immobilizer       Mobility Bed Mobility Overal bed  mobility: Needs Assistance Bed Mobility: Supine to Sit     Supine to sit: Contact guard, Used rails, HOB elevated          Transfers Overall transfer level: Needs assistance Equipment used: Rolling walker (2 wheels) Transfers: Sit to/from Stand, Bed to chair/wheelchair/BSC Sit to Stand: Min assist     Step pivot transfers: Min assist     General transfer comment: Min A to stedy, more CGA while ambulating, cues for hand placement and sliding RLE forward during sit to ease the discomfort of KI in back of thigh     Balance                                           ADL either performed or assessed with clinical judgement   ADL Overall ADL's : Needs assistance/impaired                     Lower Body Dressing: Minimal assistance;Bed level Lower Body Dressing Details (indicate cue type and reason): able to don underwear in long sitting, reached RLE with education to don over that extremity first, some light assistance as it got caught on no slip sock but otherwise no assistance needed for finising pulling up on RLE and then LLE, able to bridge hips and pull all the way up in bed level Toilet Transfer: Minimal assistance;Cueing for sequencing;Ambulation;Rolling walker (2 wheels) Toilet Transfer Details (indicate cue type and reason): amb. to b.room doorway then c/o  light headed/dizzy, BP taken and was 120/57 (71), pt. states he thinks he woozy from recent pain meds, assisted to recliner after seated rest break         Functional mobility during ADLs: Minimal assistance;Rolling walker (2 wheels) General ADL Comments: pt. verbalized feeling pleased that he was able to bear more weight with RLE and feel like it wasnt going to "give out" on him    Extremity/Trunk Assessment              Vision       Perception     Praxis      Cognition Arousal: Alert Behavior During Therapy: [facility] for tasks assessed/performed Overall Cognitive Status: Within  Functional Limits for tasks assessed                                          Exercises      Shoulder Instructions       General Comments      Pertinent Vitals/ Pain       Pain Assessment Pain Assessment: No/denies pain Pain Intervention(s): Premedicated before session  Home Living                                          Prior Functioning/Environment              Frequency  Min 1X/week        Progress Toward Goals  OT Goals(current goals can now be found in the care plan section)  Progress towards OT goals: Progressing toward goals     Plan      Co-evaluation                 AM-PAC OT "6 Clicks" Daily Activity     Outcome Measure                    End of Session Equipment Utilized During Treatment: Gait belt;Rolling walker (2 wheels)  OT Visit Diagnosis: Unsteadiness on feet (R26.81);Other abnormalities of gait and mobility (R26.89);Pain Pain - Right/Left: Right Pain - part of body: Leg   Activity Tolerance Patient tolerated treatment well   Patient Left with call bell/phone within reach;in chair;with family/visitor present   Nurse Communication Other (comment) (secure chat with RN providing BP taken after pt. c/o light headed with ambulation. pt. left in recliner call bell in reach wife present at end of session)        Time: 0102-7253 OT Time Calculation (min): 23 min  Charges: OT General Charges $OT Visit: 1 Visit OT Treatments $Self Care/Home Management : 23-37 mins  Leroy Chen, COTA/L Acute Rehabilitation [phone]   Leroy Chen-COTA/L  07/09/2023, 1:30 PM

## 2023-07-09 NOTE — Plan of Care (Signed)
  Problem: Education: Goal: Ability to describe self-care measures that may prevent or decrease complications (Diabetes Survival Skills Education) will improve Outcome: Progressing Goal: Individualized Educational Video(s) Outcome: Progressing   Problem: Coping: Goal: Ability to adjust to condition or change in health will improve Outcome: Progressing   Problem: Fluid Volume: Goal: Ability to maintain a balanced intake and output will improve Outcome: Progressing   Problem: Health Behavior/Discharge Planning: Goal: Ability to identify and utilize available resources and services will improve Outcome: Progressing Goal: Ability to manage health-related needs will improve Outcome: Progressing   Problem: Metabolic: Goal: Ability to maintain appropriate glucose levels will improve Outcome: Progressing   Problem: Nutritional: Goal: Maintenance of adequate nutrition will improve Outcome: Progressing Goal: Progress toward achieving an optimal weight will improve Outcome: Progressing   Problem: Skin Integrity: Goal: Risk for impaired skin integrity will decrease Outcome: Progressing   Problem: Tissue Perfusion: Goal: Adequacy of tissue perfusion will improve Outcome: Progressing   Problem: Education: Goal: Knowledge of General Education information will improve Description: Including pain rating scale, medication(s)/side effects and non-pharmacologic comfort measures Outcome: Progressing   Problem: Health Behavior/Discharge Planning: Goal: Ability to manage health-related needs will improve Outcome: Progressing   Problem: Clinical Measurements: Goal: Ability to maintain clinical measurements within normal limits will improve Outcome: Progressing Goal: Will remain free from infection Outcome: Progressing Goal: Diagnostic test results will improve Outcome: Progressing Goal: Respiratory complications will improve Outcome: Progressing Goal: Cardiovascular complication will  be avoided Outcome: Progressing   Problem: Activity: Goal: Risk for activity intolerance will decrease Outcome: Progressing   Problem: Nutrition: Goal: Adequate nutrition will be maintained Outcome: Progressing   Problem: Coping: Goal: Level of anxiety will decrease Outcome: Progressing   Problem: Elimination: Goal: Will not experience complications related to bowel motility Outcome: Progressing Goal: Will not experience complications related to urinary retention Outcome: Progressing   Problem: Pain Management: Goal: General experience of comfort will improve Outcome: Progressing   Problem: Safety: Goal: Ability to remain free from injury will improve Outcome: Progressing   Problem: Skin Integrity: Goal: Risk for impaired skin integrity will decrease Outcome: Progressing   Problem: Clinical Measurements: Goal: Ability to avoid or minimize complications of infection will improve Outcome: Progressing   Problem: Skin Integrity: Goal: Skin integrity will improve Outcome: Progressing   Problem: Education: Goal: Ability to describe self-care measures that may prevent or decrease complications (Diabetes Survival Skills Education) will improve Outcome: Progressing Goal: Individualized Educational Video(s) Outcome: Progressing   Problem: Cardiac: Goal: Ability to maintain an adequate cardiac output will improve Outcome: Progressing   Problem: Health Behavior/Discharge Planning: Goal: Ability to identify and utilize available resources and services will improve Outcome: Progressing Goal: Ability to manage health-related needs will improve Outcome: Progressing   Problem: Fluid Volume: Goal: Ability to achieve a balanced intake and output will improve Outcome: Progressing   Problem: Metabolic: Goal: Ability to maintain appropriate glucose levels will improve Outcome: Progressing   Problem: Nutritional: Goal: Maintenance of adequate nutrition will  improve Outcome: Progressing Goal: Maintenance of adequate weight for body size and type will improve Outcome: Progressing   Problem: Respiratory: Goal: Will regain and/or maintain adequate ventilation Outcome: Progressing   Problem: Urinary Elimination: Goal: Ability to achieve and maintain adequate renal perfusion and functioning will improve Outcome: Progressing

## 2023-07-09 NOTE — Progress Notes (Signed)
PROGRESS NOTE                                                                                                                                                                                                             Patient Demographics:    Leroy Chen, is a 67 y.o. male, DOB - [birth year], GUY:[id number]  Outpatient Primary MD for the patient is [name]    LOS - 6  Admit date - 07/02/2023    Chief Complaint  Patient presents with   Leg Pain       Brief Narrative (HPI from H&P)    67 y.o. male with medical history significant of paroxysmal A-fib status post DCCV in 2021 on Xarelto, history of first and second-degree AV block not requiring pacemaker placement, atrial tachycardia, insulin-dependent type 2 diabetes, CKD stage IIIa, hypertension, hyperlipidemia, former smoker, obesity, hidradenitis suppurativa, depression.  Patient was recently seen in the ED on 06/27/23 for fall and right leg injury.  X-ray of the femur showed no evidence of fracture.  His pain was felt to be related to right quadriceps tendon injury.  He was made nonweightbearing on crutches, knee immobilizer placed, and was discharged with plan for orthopedic follow-up.   Continue to have swelling of his right leg with right thigh pain and discomfort came to the ER where CT scan showed very large amount of fluid collection in his right lower extremity in the thigh area, he was admitted for further care.  Further workup suggest that he might have a very large hematoma in his right thigh due to being on Xarelto and having a soft tissue injury.   Subjective:   Patient in bed comfortable, right leg postop pain is improved, still overall quite weak, does not feel confident walking by himself at home, no headache chest pain or focal weakness.   Assessment  & Plan :    Right lower extremity soft tissue injury with fall 1 week ago with right lower  extremity swelling and possible secondary cellulitis and foreign body did on CT scan.  He was diagnosed with severe sepsis at the time of admission as well.  I think patient's main issue is right thigh soft tissue hematoma which he incurred due to his severe fall 1 week prior to admission in the setting of him being on Xarelto, CT  scan has been noted.  He is already been placed on antibiotics for possible secondary cellulitis and sepsis this has been clinically ruled out.  Orthopedics was consulted, MRI of the right leg obtained with large quadriceps tear and patellar injury and soft tissue hematoma, case discussed with Dr. Lajoyce Corners on 07/05/2023, stopped antibiotics as this is not sepsis or infection, he underwent surgical correction on 07/07/2023 by Dr. Lajoyce Corners of quadriceps tendon repair.  Tolerated the procedure well.  Per Ortho. Patient may be weightbearing as tolerated with the knee immobilizer. I will follow-up with him in 2 weeks in the office. He can discharge to home as soon as he is safe with therapy.  Initiate PT OT, prophylactic dose heparin on 07/07/2023 if tolerates well than home dose Xarelto on 07/08/2023 and discharge home with home PT OT as soon as his strength has improved.   Sinus tachycardia - Due to #1 above supportive care and pain control. Improved.   Normocytic anemia - Hemoglobin currently 11.1, was 16.5 in July 2024.  Patient is chronically anticoagulated although denies any symptoms of GI bleed.  Acute anemia is clearly due to soft tissue hematoma and blood loss in the right lower extremity.  Monitor.  Type screen also done.  No signs of GI bleed continue PPI.  No further signs of bleeding postop, has tolerated prophylactic heparin well on 07/08/2023, we do expect CBC to fall a little bit on 07/09/2023 from the fluids he is going to get on 07/08/2023.    AKI on CKD stage IIIa - Likely prerenal in etiology in the setting of blood loss, hydrate on 07/08/2023 and monitor.  Expect  some drop in H&H due to heme dilution.  Paroxysmal A-fib status post DCCV in 2021 - History of atrial tachycardia: Rate is controlled  - Continue  metoprolol, and flecainide.  Started prophylactic heparin on 07/07/2023 has tolerated it well will switch to Xarelto at his home dose on 07/08/2023. Elevated total bilirubin  Due to soft tissue hematoma and reabsorption of blood, monitor. Recheck.   Hypertension  Blood pressure only on the soft side continue to hold ARB, reduce beta-blocker and hydrate on 07/08/2023.   Hyperlipidemia  Continue Crestor.   Depression  Continue Zoloft.   GERD  Continue Protonix.  Mild hyponatremia  Gentle hydration   High anion gap metabolic acidosis  - he takes Jardiance and had developed euglycemic DKA which has resolved after insulin drip, transition to subcu insulin and sliding scale, discontinue Jardiance permanently upon discharge.    Weakness and deconditioning.  PT OT, ambulate in the hallway, sit in chair in the daytime, prepare for home discharge on 07/10/2023 if he does well with PT.  He still feels extremely weak overall.    Insulin-dependent type 2 diabetes - Continue subcu insulin, will be n.p.o. after midnight on 07/06/2023 monitor cautiously.   Lab Results  Component Value Date   HGBA1C 7.6 (H) 07/03/2023   CBG (last 3)  Recent Labs    07/08/23 1611 07/08/23 2120 07/09/23 0828  GLUCAP 200* 149* 155*         Condition - Fair  Family Communication  : Wife [phone]  on 07/04/2023 at 7:53 AM, 07/08/2023  Code Status :  Full  Consults  :  Ortho  PUD Prophylaxis : PPI   Procedures  :     MRI.  Right lower extremity.    1. High-grade partial tear of the distal quadriceps tendon, involving the superficial central fibers arising from the rectus  femoris tendon as well as some of the medial fibers arising from the vastus medialis. The deep central and lateral fibers appear intact. 2. Large amount of heterogeneous soft tissue swelling  anteriorly in the distal thigh, likely representing soft tissue hematoma. 3. Possible partial tear of the medial patellofemoral ligament at its patellar attachment. 4. The menisci, cruciate and collateral ligaments appear intact. 5. No evidence of acute fracture or osteonecrosis  Lower extremity venous duplex.  No DVT.    CT right hip.  No fracture.    CT scan tibia-fibula right leg.   1. Subcutaneous edema throughout the mid and lower thigh surrounding the knee. No evidence for soft tissue gas or fluid collection. 2. No acute osseous abnormality. 3. Thin linear radiopaque foreign body measuring 1 cm within the lateral subcutaneous tissues of the distal right lower extremity.      Disposition Plan  :    Status is: Inpatient   DVT Prophylaxis  :    SCDs Start: 07/06/23 1801 SCDs Start: 07/04/23 0813 Place and maintain sequential compression device Start: 07/04/23 0718 rivaroxaban (XARELTO) tablet 20 mg   Lab Results  Component Value Date   PLT 311 07/09/2023    Diet :  Diet Order             Diet Carb Modified Fluid consistency: Thin; Room service appropriate? Yes  Diet effective now                    Inpatient Medications  Scheduled Meds:  docusate sodium  100 mg Oral BID   flecainide  50 mg Oral BID   insulin aspart  0-15 Units Subcutaneous TID WC   insulin aspart  0-5 Units Subcutaneous QHS   insulin glargine-yfgn  25 Units Subcutaneous BID   metoprolol tartrate  25 mg Oral BID   mupirocin ointment  1 Application Nasal BID   pantoprazole  40 mg Oral Daily   rivaroxaban  20 mg Oral Q supper   rosuvastatin  40 mg Oral Daily   sertraline  100 mg Oral Daily   Continuous Infusions:    PRN Meds:.acetaminophen, albuterol, bisacodyl, dicyclomine, HYDROmorphone (DILAUDID) injection, magnesium citrate, methocarbamol **OR** [DISCONTINUED] methocarbamol (ROBAXIN) injection, metoCLOPramide **OR** [DISCONTINUED] metoCLOPramide (REGLAN) injection, naLOXone (NARCAN)   injection, [DISCONTINUED] ondansetron **OR** ondansetron (ZOFRAN) IV, oxyCODONE, polyethylene glycol    Objective:   Vitals:   07/08/23 2045 07/08/23 2359 07/09/23 0600 07/09/23 0630  BP:      Pulse:      Resp:      Temp: 98.5 F (36.9 C) 98.5 F (36.9 C) 98 F (36.7 C) 98 F (36.7 C)  TempSrc: Oral Oral  Oral  SpO2: 96%     Weight:      Height:        Wt Readings from Last 3 Encounters:  07/02/23 113.4 kg  06/27/23 113.4 kg  02/12/23 113 kg     Intake/Output Summary (Last 24 hours) at 07/09/2023 0834 Last data filed at 07/08/2023 2300 Gross per 24 hour  Intake 1388.58 ml  Output 1400 ml  Net -11.42 ml     Physical Exam  Awake Alert, No new F.N deficits, Normal affect [place].AT,PERRAL Supple Neck, No JVD,   Symmetrical Chest wall movement, Good air movement bilaterally, CTAB RRR,No Gallops, Rubs or new Murmurs,  +ve B.Sounds, Abd Soft, No tenderness,   Right knee and leg in knee immobilizer   Data Review:    Recent Labs  Lab 07/05/23 0459 07/06/23 0237  07/07/23 0500 07/08/23 0439 07/09/23 0444  WBC 8.3 7.6 8.1 8.5 7.1  HGB 9.0* 8.4* 8.5* 8.3* 8.0*  HCT 28.4* 26.0* 26.7* 26.8* 25.2*  PLT 238 246 262 262 311  MCV 96.9 95.2 97.8 98.9 96.6  MCH 30.7 30.8 31.1 30.6 30.7  MCHC 31.7 32.3 31.8 31.0 31.7  RDW 15.0 15.5 15.5 15.2 15.1  LYMPHSABS 1.5 1.6 0.9 1.1 1.1  MONOABS 1.2* 1.2* 1.2* 1.6* 1.4*  EOSABS 0.1 0.1 0.1 0.1 0.1  BASOSABS 0.0 0.0 0.0 0.0 0.0    Recent Labs  Lab 07/02/23 1336 07/02/23 1336 07/02/23 1349 07/02/23 2032 07/02/23 2225 07/03/23 0406 07/03/23 0406 07/04/23 0834 07/04/23 1445 07/05/23 0459 07/06/23 0237 07/07/23 0500 07/08/23 0439 07/09/23 0444  NA 134*  --    < >  --   --  138  --   --    < > 137 137 138 138 138  K 4.9  --    < >  --   --  4.4  --   --    < > 4.4 3.6 4.7 4.8 3.6  CL 100  --   --   --   --  108  --   --    < > 110 109 105 106 106  CO2 13*  --   --   --   --  15*  --   --    < > 20* 20* 22 25 25    ANIONGAP 21*  --   --   --   --  15  --   --    < > 7 8 11 7 7   GLUCOSE 234*  --   --   --   --  181*  --   --    < > 118* 132* 157* 162* 142*  BUN 46*  --   --   --   --  36*  --   --    < > 17 19 18 19 17   CREATININE 2.01*  --   --   --   --  1.85*  --   --    < > 1.22 1.47* 1.21 1.84* 1.48*  AST 22  --   --   --   --  20  --   --   --   --   --   --   --   --   ALT 16  --   --   --   --  15  --   --   --   --   --   --   --   --   ALKPHOS 50  --   --   --   --  42  --   --   --   --   --   --   --   --   BILITOT 3.2*  --   --   --   --  2.8*  --   --   --   --   --   --   --   --   ALBUMIN 3.3*  --   --   --   --  2.7*  --   --   --   --   --   --   --   --   CRP  --   --   --   --   --   --   --  7.4*  --  9.5* 12.9* 17.7* 24.6*  --   PROCALCITON  --   --   --   --   --   --    < > 0.12  --  0.11 0.13 0.14 0.47 0.35  LATICACIDVEN  --   --   --  2.5* 3.2* 1.3  --   --   --   --   --   --   --   --   INR  --   --   --   --   --   --   --  1.5*  --   --   --   --   --   --   TSH  --   --   --   --   --  1.210  --   --   --   --   --   --   --   --   HGBA1C  --   --   --   --   --  7.6*  --   --   --   --   --   --   --   --   BNP  --   --   --   --   --   --   --  353.6*  --  348.2* 98.5 74.8 90.7  --   MG  --    < >  --   --   --  2.3  --  2.3  --  2.3 2.3 2.2 2.2  --   CALCIUM 9.4  --   --   --   --  7.9*  --   --    < > 7.9* 8.1* 8.6* 8.6* 8.4*   < > = values in this interval not displayed.      Recent Labs  Lab 07/02/23 1336 07/02/23 2032 07/02/23 2225 07/03/23 0406 07/03/23 0406 07/04/23 0834 07/04/23 1445 07/05/23 0459 07/06/23 0237 07/07/23 0500 07/08/23 0439 07/09/23 0444  CRP  --   --   --   --   --  7.4*  --  9.5* 12.9* 17.7* 24.6*  --   PROCALCITON  --   --   --   --    < > 0.12  --  0.11 0.13 0.14 0.47 0.35  LATICACIDVEN  --  2.5* 3.2* 1.3  --   --   --   --   --   --   --   --   INR  --   --   --   --   --  1.5*  --   --   --   --   --   --   TSH  --   --   --   1.210  --   --   --   --   --   --   --   --   HGBA1C  --   --   --  7.6*  --   --   --   --   --   --   --   --   BNP  --   --   --   --   --  353.6*  --  348.2* 98.5 74.8 90.7  --   MG   < >  --   --  2.3  --  2.3  --  2.3 2.3 2.2 2.2  --   CALCIUM  --   --   --  7.9*  --   --    < > 7.9* 8.1* 8.6* 8.6* 8.4*   < > = values in this interval not displayed.    --------------------------------------------------------------------------------------------------------------- No results found for: "CHOL", "HDL", "LDLCALC", "LDLDIRECT", "TRIG", "CHOLHDL"  Lab Results  Component Value Date   HGBA1C 7.6 (H) 07/03/2023   No results for input(s): "TSH", "T4TOTAL", "FREET4", "T3FREE", "THYROIDAB" in the last 72 hours.  No results for input(s): "VITAMINB12", "FOLATE", "FERRITIN", "TIBC", "IRON", "RETICCTPCT" in the last 72 hours.  ------------------------------------------------------------------------------------------------------------------ Cardiac Enzymes No results for input(s): "CKMB", "TROPONINI", "MYOGLOBIN" in the last 168 hours.  Invalid input(s): "CK"  Micro Results Recent Results (from the past 240 hours)  Resp panel by RT-PCR (RSV, Flu A&B, Covid) Anterior Nasal Swab     Status: None   Collection Time: 07/02/23  4:59 PM   Specimen: Anterior Nasal Swab  Result Value Ref Range Status   SARS Coronavirus 2 by RT PCR NEGATIVE NEGATIVE Final   Influenza A by PCR NEGATIVE NEGATIVE Final   Influenza B by PCR NEGATIVE NEGATIVE Final    Comment: (NOTE) The Xpert Xpress SARS-CoV-2/FLU/RSV plus assay is intended as an aid in the diagnosis of influenza from Nasopharyngeal swab specimens and should not be used as a sole basis for treatment. Nasal washings and aspirates are unacceptable for Xpert Xpress SARS-CoV-2/FLU/RSV testing.  Fact Sheet for Patients: BloggerCourse.com  Fact Sheet for Healthcare Providers: SeriousBroker.it  This  test is not yet approved or cleared by the [place] FDA and has been authorized for detection and/or diagnosis of SARS-CoV-2 by FDA under an Emergency Use Authorization (EUA). This EUA will remain in effect (meaning this test can be used) for the duration of the COVID-19 declaration under Section 564(b)(1) of the Act, 21 U.S.C. section 360bbb-3(b)(1), unless the authorization is terminated or revoked.     Resp Syncytial Virus by PCR NEGATIVE NEGATIVE Final    Comment: (NOTE) Fact Sheet for Patients: BloggerCourse.com  Fact Sheet for Healthcare Providers: SeriousBroker.it  This test is not yet approved or cleared by the [place] FDA and has been authorized for detection and/or diagnosis of SARS-CoV-2 by FDA under an Emergency Use Authorization (EUA). This EUA will remain in effect (meaning this test can be used) for the duration of the COVID-19 declaration under Section 564(b)(1) of the Act, 21 U.S.C. section 360bbb-3(b)(1), unless the authorization is terminated or revoked.  Performed at [facility], [address], [place], [place] [address]   Blood culture (routine x 2)     Status: None   Collection Time: 07/02/23  8:24 PM   Specimen: BLOOD RIGHT HAND  Result Value Ref Range Status   Specimen Description BLOOD RIGHT HAND  Final   Special Requests   Final    BOTTLES DRAWN AEROBIC ONLY Blood Culture results may not be optimal due to an inadequate volume of blood received in culture bottles   Culture   Final    NO GROWTH 5 DAYS Performed at [facility], [address], [place], [place] [address]    Report Status 07/07/2023 FINAL  Final  Blood culture (routine x 2)     Status: None   Collection Time: 07/02/23  8:35 PM   Specimen: BLOOD  Result Value Ref Range Status   Specimen Description BLOOD SITE NOT SPECIFIED  Final   Special Requests   Final    BOTTLES DRAWN AEROBIC ONLY Blood Culture results may  not be optimal due to an inadequate volume of  blood received in culture bottles   Culture   Final    NO GROWTH 5 DAYS Performed at [facility], [address] [place], [place] [address]    Report Status 07/07/2023 FINAL  Final  MRSA Next Gen by PCR, Nasal     Status: None   Collection Time: 07/04/23 11:24 AM   Specimen: Nasal Mucosa; Nasal Swab  Result Value Ref Range Status   MRSA by PCR Next Gen NOT DETECTED NOT DETECTED Final    Comment: (NOTE) The GeneXpert MRSA Assay (FDA approved for NASAL specimens only), is one component of a comprehensive MRSA colonization surveillance program. It is not intended to diagnose MRSA infection nor to guide or monitor treatment for MRSA infections. Test performance is not FDA approved in patients less than 44 years old. Performed at [facility], [address] [place], [place] [address]   Surgical PCR screen     Status: None   Collection Time: 07/06/23  8:23 AM   Specimen: Nasal Mucosa; Nasal Swab  Result Value Ref Range Status   MRSA, PCR NEGATIVE NEGATIVE Final   Staphylococcus aureus NEGATIVE NEGATIVE Final    Comment: (NOTE) The Xpert SA Assay (FDA approved for NASAL specimens in patients 65 years of age and older), is one component of a comprehensive surveillance program. It is not intended to diagnose infection nor to guide or monitor treatment. Performed at [facility], [address] [place], [place] [address]     Radiology Reports No results found.     Signature  -   Susa Raring M.D on 07/09/2023 at 8:34 AM   -  To page go to [email or web address]

## 2023-07-09 NOTE — Anesthesia Postprocedure Evaluation (Signed)
Anesthesia Post Note  Patient: Leroy Chen  Procedure(s) Performed: REPAIR RIGHT QUADRICEP TENDON (Right: Thigh)     Patient location during evaluation: PACU Anesthesia Type: General Level of consciousness: awake Pain management: pain level controlled Vital Signs Assessment: post-procedure vital signs reviewed and stable Respiratory status: spontaneous breathing, nonlabored ventilation and respiratory function stable Cardiovascular status: blood pressure returned to baseline and stable Postop Assessment: no apparent nausea or vomiting Anesthetic complications: no   No notable events documented.  Last Vitals:  Vitals:   07/08/23 2045 07/08/23 2359  BP:    Pulse:    Resp:    Temp: 36.9 C 36.9 C  SpO2: 96%     Last Pain:  Vitals:   07/09/23 0512  TempSrc:   PainSc: 10-Worst pain ever                 Catheryn Bacon Kimarie Coor

## 2023-07-09 NOTE — Inpatient Diabetes Management (Signed)
Inpatient Diabetes Program Recommendations  AACE/ADA: New Consensus Statement on Inpatient Glycemic Control (2015)  Target Ranges:  Prepandial:   less than 140 mg/dL      Peak postprandial:   less than 180 mg/dL (1-2 hours)      Critically ill patients:  140 - 180 mg/dL   Lab Results  Component Value Date   GLUCAP 155 (H) 07/09/2023   HGBA1C 7.6 (H) 07/03/2023    Review of Glycemic Control  Diabetes history: type 2 Outpatient Diabetes medications: Lantus 58 units daily, Ozempic 2 mg weekly, Jardiance 12.5 mg daily, Metformin 500 mg BID Current orders for Inpatient glycemic control: Semglee 25 units BID, Novolog 0-15 units correction scale TID, HS scale  Inpatient Diabetes Program Recommendations:   Spoke with patient about his diabetes. States that he is followed at the [place]. Had an appointment last week, but had to cancel since he was in the hospital. States that he uses the CGM sensors for checking his blood sugars. Reviewed his HgbA1C of 7.6% which he said was so much better than it had been. He has been on the Lantus 58 units dose for about 1 year.   Will continue to monitor blood sugars while in the hospital.  Smith Mince RN BSN CDE Diabetes Coordinator Pager: [phone]  8am-5pm

## 2023-07-09 NOTE — Progress Notes (Signed)
Patient is alert, awake, BP 174/70 hr 98. Md notified.

## 2023-07-10 ENCOUNTER — Inpatient Hospital Stay (HOSPITAL_COMMUNITY): Payer: No Typology Code available for payment source

## 2023-07-10 DIAGNOSIS — Z794 Long term (current) use of insulin: Secondary | ICD-10-CM

## 2023-07-10 DIAGNOSIS — N1831 Chronic kidney disease, stage 3a: Secondary | ICD-10-CM

## 2023-07-10 DIAGNOSIS — I4891 Unspecified atrial fibrillation: Secondary | ICD-10-CM | POA: Diagnosis not present

## 2023-07-10 DIAGNOSIS — E119 Type 2 diabetes mellitus without complications: Secondary | ICD-10-CM

## 2023-07-10 DIAGNOSIS — K8000 Calculus of gallbladder with acute cholecystitis without obstruction: Secondary | ICD-10-CM | POA: Diagnosis not present

## 2023-07-10 DIAGNOSIS — R1011 Right upper quadrant pain: Secondary | ICD-10-CM | POA: Diagnosis not present

## 2023-07-10 DIAGNOSIS — R932 Abnormal findings on diagnostic imaging of liver and biliary tract: Secondary | ICD-10-CM

## 2023-07-10 DIAGNOSIS — L03115 Cellulitis of right lower limb: Secondary | ICD-10-CM | POA: Diagnosis not present

## 2023-07-10 DIAGNOSIS — R17 Unspecified jaundice: Secondary | ICD-10-CM | POA: Diagnosis not present

## 2023-07-10 DIAGNOSIS — D649 Anemia, unspecified: Secondary | ICD-10-CM

## 2023-07-10 LAB — COMPREHENSIVE METABOLIC PANEL
ALT: 212 U/L — ABNORMAL HIGH (ref 0–44)
AST: 407 U/L — ABNORMAL HIGH (ref 15–41)
Albumin: 2.7 g/dL — ABNORMAL LOW (ref 3.5–5.0)
Alkaline Phosphatase: 271 U/L — ABNORMAL HIGH (ref 38–126)
Anion gap: 14 (ref 5–15)
BUN: 15 mg/dL (ref 8–23)
CO2: 18 mmol/L — ABNORMAL LOW (ref 22–32)
Calcium: 8.6 mg/dL — ABNORMAL LOW (ref 8.9–10.3)
Chloride: 102 mmol/L (ref 98–111)
Creatinine, Ser: 1.49 mg/dL — ABNORMAL HIGH (ref 0.61–1.24)
GFR, Estimated: 51 mL/min — ABNORMAL LOW (ref >60)
Glucose, Bld: 239 mg/dL — ABNORMAL HIGH (ref 70–99)
Potassium: 4.1 mmol/L (ref 3.5–5.1)
Sodium: 134 mmol/L — ABNORMAL LOW (ref 135–145)
Total Bilirubin: 13.2 mg/dL — ABNORMAL HIGH (ref <1.2)
Total Protein: 7.1 g/dL (ref 6.5–8.1)

## 2023-07-10 LAB — CBC WITH DIFFERENTIAL/PLATELET
Abs Immature Granulocytes: 0.1 10*3/uL — ABNORMAL HIGH (ref 0.00–0.07)
Basophils Absolute: 0 10*3/uL (ref 0.0–0.1)
Basophils Relative: 0 %
Eosinophils Absolute: 0 10*3/uL (ref 0.0–0.5)
Eosinophils Relative: 0 %
HCT: 28.7 % — ABNORMAL LOW (ref 39.0–52.0)
Hemoglobin: 9.2 g/dL — ABNORMAL LOW (ref 13.0–17.0)
Immature Granulocytes: 1 %
Lymphocytes Relative: 5 %
Lymphs Abs: 0.4 10*3/uL — ABNORMAL LOW (ref 0.7–4.0)
MCH: 30.4 pg (ref 26.0–34.0)
MCHC: 32.1 g/dL (ref 30.0–36.0)
MCV: 94.7 fL (ref 80.0–100.0)
Monocytes Absolute: 1.1 10*3/uL — ABNORMAL HIGH (ref 0.1–1.0)
Monocytes Relative: 14 %
Neutro Abs: 6.3 10*3/uL (ref 1.7–7.7)
Neutrophils Relative %: 80 %
Platelets: 346 10*3/uL (ref 150–400)
RBC: 3.03 MIL/uL — ABNORMAL LOW (ref 4.22–5.81)
RDW: 15.2 % (ref 11.5–15.5)
WBC: 7.9 10*3/uL (ref 4.0–10.5)
nRBC: 0.5 % — ABNORMAL HIGH (ref 0.0–0.2)

## 2023-07-10 LAB — PROTIME-INR
INR: 2.2 — ABNORMAL HIGH (ref 0.8–1.2)
Prothrombin Time: 25 s — ABNORMAL HIGH (ref 11.4–15.2)

## 2023-07-10 LAB — BILIRUBIN, FRACTIONATED(TOT/DIR/INDIR)
Bilirubin, Direct: 9.2 mg/dL — ABNORMAL HIGH (ref 0.0–0.2)
Indirect Bilirubin: 5.7 mg/dL — ABNORMAL HIGH (ref 0.3–0.9)
Total Bilirubin: 14.9 mg/dL — ABNORMAL HIGH (ref <1.2)

## 2023-07-10 LAB — GLUCOSE, CAPILLARY
Glucose-Capillary: 222 mg/dL — ABNORMAL HIGH (ref 70–99)
Glucose-Capillary: 217 mg/dL — ABNORMAL HIGH (ref 70–99)

## 2023-07-10 LAB — PROCALCITONIN: Procalcitonin: 0.5 ng/mL

## 2023-07-10 LAB — C-REACTIVE PROTEIN: CRP: 18.5 mg/dL — ABNORMAL HIGH (ref <1.0)

## 2023-07-10 LAB — LIPASE, BLOOD: Lipase: 90 U/L — ABNORMAL HIGH (ref 11–51)

## 2023-07-10 MED ORDER — GADOBUTROL 1 MMOL/ML IV SOLN
10.0000 mL | Freq: Once | INTRAVENOUS | Status: AC | PRN
  Administered 2023-07-10: 10 mL via INTRAVENOUS

## 2023-07-10 MED ORDER — SODIUM CHLORIDE 0.9 % IV SOLN
2.0000 g | INTRAVENOUS | Status: DC
  Administered 2023-07-10 – 2023-07-14 (×5): 2 g via INTRAVENOUS
  Filled 2023-07-10 (×6): qty 20

## 2023-07-10 MED ORDER — LACTATED RINGERS IV SOLN: INTRAVENOUS | Status: DC

## 2023-07-10 MED ORDER — METRONIDAZOLE 500 MG/100ML IV SOLN
500.0000 mg | Freq: Two times a day (BID) | INTRAVENOUS | Status: DC
Start: 2023-07-10 — End: 2023-07-19
  Administered 2023-07-10 – 2023-07-15 (×10): 500 mg via INTRAVENOUS
  Filled 2023-07-10 (×11): qty 100

## 2023-07-10 MED ORDER — INSULIN GLARGINE-YFGN 100 UNIT/ML ~~LOC~~ SOLN
15.0000 [IU] | Freq: Two times a day (BID) | SUBCUTANEOUS | Status: DC
  Administered 2023-07-10 – 2023-07-13 (×6): 15 [IU] via SUBCUTANEOUS
  Filled 2023-07-10 (×7): qty 0.15

## 2023-07-10 MED ORDER — IOHEXOL 350 MG/ML SOLN
75.0000 mL | Freq: Once | INTRAVENOUS | Status: AC | PRN
  Administered 2023-07-10: 75 mL via INTRAVENOUS

## 2023-07-10 MED ORDER — INSULIN ASPART 100 UNIT/ML IJ SOLN
0.0000 [IU] | Freq: Four times a day (QID) | INTRAMUSCULAR | Status: DC
  Administered 2023-07-10: 5 [IU] via SUBCUTANEOUS
  Administered 2023-07-10: 3 [IU] via SUBCUTANEOUS
  Administered 2023-07-11 (×2): 2 [IU] via SUBCUTANEOUS
  Administered 2023-07-11: 3 [IU] via SUBCUTANEOUS
  Administered 2023-07-12: 5 [IU] via SUBCUTANEOUS
  Administered 2023-07-13 – 2023-07-14 (×5): 3 [IU] via SUBCUTANEOUS
  Administered 2023-07-14: 2 [IU] via SUBCUTANEOUS
  Administered 2023-07-14: 8 [IU] via SUBCUTANEOUS
  Administered 2023-07-15 (×2): 5 [IU] via SUBCUTANEOUS
  Administered 2023-07-15: 3 [IU] via SUBCUTANEOUS
  Administered 2023-07-16: 5 [IU] via SUBCUTANEOUS
  Administered 2023-07-16: 3 [IU] via SUBCUTANEOUS

## 2023-07-10 NOTE — Final Progress Note (Signed)
Offered patient laxative at this time and he stated that he is not ready to take it at this time.

## 2023-07-10 NOTE — TOC Progression Note (Signed)
Transition of Care [facility]) - Progression Note    Patient Details  Name: Leroy Chen MRN: [id number] Date of Birth: 10-17-1955  [facility]) CM/SW Contact  Gordy Clement, RN Phone Number: 07/10/2023, 4:34 PM  Clinical Narrative:   Request for Home Health from [facility] and [name]st faxed to [place] to provide             Expected Discharge Plan and Services                                               Social Determinants of Health (SDOH) Interventions SDOH Screenings   Food Insecurity: No Food Insecurity (07/03/2023)  Housing: Low Risk  (07/03/2023)  Transportation Needs: No Transportation Needs (07/03/2023)  Utilities: Not At Risk (07/03/2023)  Social Connections: Unknown (11/25/2021)   Received from [facility]  Tobacco Use: Medium Risk (07/02/2023)    Readmission Risk Interventions     No data to display

## 2023-07-10 NOTE — Progress Notes (Addendum)
Pharmacy Antibiotic Note  Leroy Chen is a 67 y.o. male admitted on 07/02/2023 with  intraabdominal infection  noted by new severe EUQ and epigastric abdominal pain. Pharmacy has been consulted for ceftriaxone and flagyl dosing. Documented penicillin allergy (swelling) but tolerated cefazolin and cefepime earlier this admission. GI General Surgery has been consulted.  Afebrile without leukocytosis.  Plan: Start ceftriaxone 2g IV Q24H Start metronidazole 500mg  IV Q12H F/u clinical progression, GI recs, and LOT Pharmacy will continue to follow peripherally  Height: 6\' 1"  (185.4 cm) Weight: 113.4 kg (250 lb) IBW/kg (Calculated) : 79.9  Temp (24hrs), Avg:98.7 F (37.1 C), Min:98 F (36.7 C), Max:99.2 F (37.3 C)  Recent Labs  Lab 07/06/23 0237 07/07/23 0500 07/08/23 0439 07/09/23 0444 07/10/23 0757  WBC 7.6 8.1 8.5 7.1 7.9  CREATININE 1.47* 1.21 1.84* 1.48* 1.49*    Estimated Creatinine Clearance: 64.4 mL/min (A) (by C-G formula based on SCr of 1.49 mg/dL (H)).    Allergies  Allergen Reactions   Penicillins Swelling    Antimicrobials this admission: Ceftriaxone 12/24 > Flagyl 12/24 > Cefazolin 12/20 > 12/21 Cefepime 12/16 > 12/19 Vancomycin 12/16 > 12/18  Microbiology results: 12/16 BCx: negative 12/18 MRSA PCR: negative  Thank you for allowing pharmacy to be a part of this patient's care.  Jenita Seashore 07/10/2023 11:15 AM

## 2023-07-10 NOTE — Consult Note (Signed)
Consultation  Referring Provider:[facility]/ Thedore Mins Primary Care Physician:  [facility], [name] Primary Gastroenterologist:  none  Reason for Consultation: Right upper quadrant pain, LFTs with significant hyperbilirubinemia, abnormal CT scan  HPI: Leroy Chen is a 67 y.o. male who was admitted to the hospital about 8 days ago with right lower extremity swelling and pain after he had a fall on 06/27/2023 with injury to the right lower extremity. On admit he was noted to have a large fluid collection in the right thigh consistent with a hematoma in setting of chronic Xarelto use.  Initially felt to have sepsis but that was then later ruled out. Went to the OR on 07/06/2023 for repair of a right quadriceps rupture. He has had mild anemia with hemoglobin of 11.1 on 07/09/2023.  No evidence of acute GI blood loss. This morning he had acute fairly intense right upper quadrant pain that lasted for a couple of hours was constant nonradiating was associated with belching burping and some nausea but no vomiting.  Pain subsided and has not recurred.  He says he may have had some similar but much less intense episodes in the past which she has attributed to constipation.  He also says that he had not had a bowel movement since he was admitted. He was seen by surgery earlier this morning Dr. Cliffton Asters prior to any imaging and at that time his pain had resolved.  It was therefore felt unlikely that this was biliary colic. He has since had a CT of the abdomen pelvis that showed bibasilar atelectasis, there is a 2.8 cm right hepatic lobe lesion, no intra hepatic or extrahepatic ductal dilation noted the gallbladder is distended with multiple small calcified stones and there are changes of pericholecystic fluid and inflammatory changes suggestive of acute cholecystitis.  Labs today with WBC of 7.9/hemoglobin 9.2/hematocrit 28.7 T. bili 13.2/alk phos 270/AST 407/ALT 212 Creatinine 1.49  Last c-Met had been  done on admission 07/02/2023 at that time with T. bili of 3.2/alk phos of 15 otherwise normal LFTs. Reviewing his labs he had a minimally elevated bilirubin of 1.9 back in 2022  Patient is still feeling okay this afternoon, has not had any recurrence of pain, and was hoping to be discharged from the hospital tomorrow.   He does have history of insulin-dependent diabetes mellitus, atrial fibrillation status post prior DCCV 2021, on chronic Xarelto, chronic kidney disease stage IIIa, hypertension, obesity and is status post surgery for perforated diverticulitis 2016 requiring Hartmann and temporary colostomy.  Past Medical History:  Diagnosis Date   Atrial fibrillation [facility]) March 2015   Diabetes mellitus without complication (HCC)    NIDDM, type II   High cholesterol    Hydradenitis    Sebaceous cyst    hx of   Sigmoid diverticulitis    Wears glasses     Past Surgical History:  Procedure Laterality Date   COLECTOMY  10/15/14   hand sugery     ILEO LOOP COLOSTOMY CLOSURE N/A 04/27/2015   Procedure: DIAGNOSTIC LAPAROSCOPY,  EXPLORATORY LAPAROTOMY WITH RESECTION AND CLOSURE OF COLOSTOMY ;  Surgeon: [name], MD;  Location: [facility] OR;  Service: General;  Laterality: N/A;   LAPAROTOMY N/A 10/15/2014   Procedure: EXPLORATORY LAPAROTOMY WITH DRAINAGE OF INTRA- ABDOMINAL ABSCESS;  Surgeon: [name], MD;  Location: [facility] OR;  Service: General;  Laterality: N/A;   ORCHIECTOMY     PARTIAL COLECTOMY N/A 10/15/2014   Procedure: SIGMOID COLECTOMY WITH COLOSTOMY CREATION;  Surgeon: [name], MD;  Location: [facility] OR;  Service: General;  Laterality: N/A;   PILONIDAL CYST EXCISION N/A 01/01/2015   Procedure: EXCISION OF PILONIDAL DISEASE;  Surgeon: Romie Levee, MD;  Location: [facility] [place];  Service: General;  Laterality: N/A;   QUADRICEPS TENDON REPAIR Right 07/06/2023   Procedure: REPAIR RIGHT QUADRICEP TENDON;  Surgeon: Nadara Mustard, MD;  Location: [facility] OR;  Service:  Orthopedics;  Laterality: Right;    Prior to Admission medications   Medication Sig Start Date End Date Taking? Authorizing Provider  Adalimumab 40 MG/0.4ML PNKT Inject 40 mg into the skin once a week. Mondays   Yes [provider]  albuterol (PROVENTIL HFA;VENTOLIN HFA) 108 (90 Base) MCG/ACT inhaler Inhale 1-2 puffs into the lungs every 6 (six) hours as needed for wheezing or shortness of breath. 08/05/17  Yes Wallis Bamberg, PA-C  Chlorhexidine Gluconate 4 % SOLN Apply 1 application topically See admin instructions. Apply to armpits & Groin 3 times a week in the shower. Leave on for 1 minute and then rinse   Yes [provider]  clindamycin (CLEOCIN T) 1 % lotion Apply 1 application topically 2 (two) times daily. Draining bumps 03/14/19  Yes [provider]  clobetasol (TEMOVATE) 0.05 % external solution Apply 1 application topically 2 (two) times daily as needed (infection on scalp).   Yes [provider]  dicyclomine (BENTYL) 10 MG capsule Take 10 mg by mouth 3 (three) times daily as needed for spasms.   Yes [provider]  empagliflozin (JARDIANCE) 25 MG TABS tablet Take 12.5 mg by mouth daily.   Yes [provider]  ergocalciferol (VITAMIN D2) 1.25 MG (50000 UT) capsule Take 50,000 Units by mouth once a week. Mondays   Yes [provider]  flecainide (TAMBOCOR) 50 MG tablet Take 50 mg by mouth 2 (two) times daily.   Yes [provider]  Fluocinolone Acetonide 0.01 % OIL Place 1 application in ear(s) 2 (two) times daily as needed (scaling).   Yes [provider]  HYDROcodone-acetaminophen (NORCO/VICODIN) 5-325 MG tablet Take 1 tablet by mouth every 6 (six) hours as needed. 06/27/23  Yes Schutt, Edsel Petrin, PA-C  insulin glargine (LANTUS) 100 UNIT/ML Solostar Pen Inject 58 Units into the skin every evening. 04/03/19  Yes [provider]  losartan (COZAAR) 100 MG tablet Take 50 mg by mouth daily. 10/28/22  Yes  [provider]  melatonin 3 MG TABS tablet Take 3 mg by mouth at bedtime as needed.   Yes [provider]  metFORMIN (GLUCOPHAGE-XR) 500 MG 24 hr tablet Take 500 mg by mouth 2 (two) times daily with a meal. 01/01/23  Yes [provider]  metoprolol tartrate (LOPRESSOR) 50 MG tablet Take by mouth. 01/08/23  Yes [provider]  Multiple Vitamin (MULTIVITAMIN) tablet Take 1 tablet by mouth daily.   Yes [provider]  omeprazole (PRILOSEC) 20 MG capsule Take 20 mg by mouth 2 (two) times daily as needed (heartburn).   Yes [provider]  pantoprazole (PROTONIX) 40 MG tablet Take 40 mg by mouth daily. 01/08/23  Yes [provider]  rivaroxaban (XARELTO) 20 MG TABS tablet Take 20 mg by mouth daily.   Yes [provider]  rosuvastatin (CRESTOR) 40 MG tablet Take 40 mg by mouth at bedtime.   Yes [provider]  Semaglutide, 2 MG/DOSE, 8 MG/3ML SOPN Inject 2 mg into the skin once a week. Inject on Friday 01/17/23  Yes [provider]  sertraline (ZOLOFT) 100 MG  tablet Take 100 mg by mouth daily.   Yes [provider]  Testosterone 1.62 % GEL Place 3 Pump onto the skin daily. Apply to upper arm or shoulder areas only   Yes [provider]  Semaglutide (OZEMPIC, 1 MG/DOSE, ) Inject 1 mg into the skin once a week. Mondays    [provider]    Current Facility-Administered Medications  Medication Dose Route Frequency Provider Last Rate Last Admin   acetaminophen (TYLENOL) tablet 325-650 mg  325-650 mg Oral Q6H PRN Nadara Mustard, MD   325 mg at 07/08/23 1822   albuterol (PROVENTIL) (2.5 MG/3ML) 0.083% nebulizer solution 2.5 mg  2.5 mg Nebulization Q6H PRN Nadara Mustard, MD       bisacodyl (DULCOLAX) suppository 10 mg  10 mg Rectal Daily PRN Nadara Mustard, MD       cefTRIAXone (ROCEPHIN) 2 g in sodium chloride 0.9 % 100 mL IVPB  2 g Intravenous Q24H Jenita Seashore, RPH 200 mL/hr at  07/10/23 1313 2 g at 07/10/23 1313   dicyclomine (BENTYL) capsule 10 mg  10 mg Oral TID PRN Nadara Mustard, MD       docusate sodium (COLACE) capsule 100 mg  100 mg Oral BID Nadara Mustard, MD   100 mg at 07/10/23 0935   flecainide (TAMBOCOR) tablet 50 mg  50 mg Oral BID Nadara Mustard, MD   50 mg at 07/10/23 0934   HYDROmorphone (DILAUDID) injection 0.5-1 mg  0.5-1 mg Intravenous Q4H PRN Nadara Mustard, MD   0.5 mg at 07/10/23 1256   insulin aspart (novoLOG) injection 0-15 Units  0-15 Units Subcutaneous Q6H Leroy Sea, MD   5 Units at 07/10/23 1314   insulin glargine-yfgn (SEMGLEE) injection 15 Units  15 Units Subcutaneous BID Leroy Sea, MD       lactated ringers infusion   Intravenous Continuous Leroy Sea, MD 50 mL/hr at 07/10/23 1311 New Bag at 07/10/23 1311   magnesium citrate solution 1 Bottle  1 Bottle Oral Once PRN Nadara Mustard, MD       methocarbamol (ROBAXIN) tablet 500 mg  500 mg Oral Q6H PRN Nadara Mustard, MD   500 mg at 07/10/23 1256   metoCLOPramide (REGLAN) tablet 5-10 mg  5-10 mg Oral Q8H PRN Nadara Mustard, MD       metoprolol tartrate (LOPRESSOR) tablet 25 mg  25 mg Oral BID Leroy Sea, MD   25 mg at 07/10/23 0935   metroNIDAZOLE (FLAGYL) IVPB 500 mg  500 mg Intravenous Q12H Jenita Seashore, RPH 100 mL/hr at 07/10/23 1311 500 mg at 07/10/23 1311   naloxone (NARCAN) injection 0.4 mg  0.4 mg Intravenous PRN Nadara Mustard, MD       ondansetron [facility]) injection 4 mg  4 mg Intravenous Q6H PRN Nadara Mustard, MD       oxyCODONE (Oxy IR/ROXICODONE) immediate release tablet 5-10 mg  5-10 mg Oral Q4H PRN Nadara Mustard, MD   10 mg at 07/09/23 0512   pantoprazole (PROTONIX) EC tablet 40 mg  40 mg Oral Daily Nadara Mustard, MD   40 mg at 07/10/23 0935   polyethylene glycol (MIRALAX / GLYCOLAX) packet 17 g  17 g Oral Daily PRN Nadara Mustard, MD   17 g at 07/10/23 0358   rivaroxaban (XARELTO) tablet 20 mg  20 mg Oral Q supper Jenita Seashore, RPH   20 mg  at 07/09/23  1619   rosuvastatin (CRESTOR) tablet 40 mg  40 mg Oral Daily Nadara Mustard, MD   40 mg at 07/10/23 0934   sertraline (ZOLOFT) tablet 100 mg  100 mg Oral Daily Nadara Mustard, MD   100 mg at 07/10/23 0935    Allergies as of 07/02/2023 - Review Complete 07/02/2023  Allergen Reaction Noted   Penicillins Swelling 08/02/2012    Family History  Problem Relation Age of Onset   Healthy Mother     Social History   Socioeconomic History   Marital status: Married    Spouse name: Not on file   Number of children: Not on file   Years of education: Not on file   Highest education level: Not on file  Occupational History   Not on file  Tobacco Use   Smoking status: Former    Current packs/day: 0.00    Average packs/day: 0.3 packs/day for 20.0 years (5.0 ttl pk-yrs)    Types: Cigarettes    Start date: 33    Quit date: 2018    Years since quitting: 6.9   Smokeless tobacco: Never  Vaping Use   Vaping status: Never Used  Substance and Sexual Activity   Alcohol use: Yes    Comment: Social- moderate - one beer at the wedding     Drug use: No   Sexual activity: Yes    Birth control/protection: Condom  Other Topics Concern   Not on file  Social History Narrative   Not on file   Social Drivers of Health   Financial Resource Strain: Not on file  Food Insecurity: No Food Insecurity (07/03/2023)   Hunger Vital Sign    Worried About Running Out of Food in the Last Year: Never true    Ran Out of Food in the Last Year: Never true  Transportation Needs: No Transportation Needs (07/03/2023)   PRAPARE - Administrator, Civil Service (Medical): No    Lack of Transportation (Non-Medical): No  Physical Activity: Not on file  Stress: Not on file  Social Connections: Unknown (11/25/2021)   Received from [facility]   Social Network    Social Network: Not on file  Intimate Partner Violence: Not At Risk (07/03/2023)   Humiliation, Afraid, Rape, and  Kick questionnaire    Fear of Current or Ex-Partner: No    Emotionally Abused: No    Physically Abused: No    Sexually Abused: No    Review of Systems: Pertinent positive and negative review of systems were noted in the above HPI section.  All other review of systems was otherwise negative.   Physical Exam: Vital signs in last 24 hours: Temp:  [98.5 F (36.9 C)-99.2 F (37.3 C)] 99.2 F (37.3 C) (12/24 0816) Pulse Rate:  [85-95] 95 (12/24 0346) Resp:  [16-20] 20 (12/24 0816) BP: (145-170)/(55-87) 161/63 (12/24 0816) SpO2:  [96 %-98 %] 98 % (12/24 0816) Last BM Date : 07/03/23 General:   Alert,  Well-developed, well-nourished, older African-American male, pleasant and cooperative in NAD, wife at bedside Head:  Normocephalic and atraumatic. Eyes:  Sclera icteric, conjunctiva pink. Ears:  Normal auditory acuity. Nose:  No deformity, discharge,  or lesions. Mouth:  No deformity or lesions.   Neck:  Supple; no masses or thyromegaly. Lungs:  Clear throughout to auscultation.   No wheezes, crackles, or rhonchi.  Heart:  Regular rate and rhythm; no murmurs, clicks, rubs,  or gallops. Abdomen:  Soft,nontender, BS active,nonpalp mass or hsm.  Rectal: Not done Msk:  Symmetrical without gross deformities. . Pulses:  Normal pulses noted. Extremities: Full brace on the right lower extremity Neurologic:  Alert and  oriented x4;  grossly normal neurologically. Skin:  Intact without significant lesions or rashes.. Psych:  Alert and cooperative. Normal mood and affect.  Intake/Output from previous day: 12/23 0701 - 12/24 0700 In: -  Out: 1050 [Urine:1050] Intake/Output this shift: Total I/O In: -  Out: 700 [Urine:700]  Lab Results: Recent Labs    07/08/23 0439 07/09/23 0444 07/10/23 0757  WBC 8.5 7.1 7.9  HGB 8.3* 8.0* 9.2*  HCT 26.8* 25.2* 28.7*  PLT 262 311 346   BMET Recent Labs    07/08/23 0439 07/09/23 0444 07/10/23 0757  NA 138 138 134*  K 4.8 3.6 4.1  CL 106  106 102  CO2 25 25 18*  GLUCOSE 162* 142* 239*  BUN 19 17 15   CREATININE 1.84* 1.48* 1.49*  CALCIUM 8.6* 8.4* 8.6*   LFT Recent Labs    07/10/23 0757  PROT 7.1  ALBUMIN 2.7*  AST 407*  ALT 212*  ALKPHOS 271*  BILITOT 13.2*   PT/INR No results for input(s): "LABPROT", "INR" in the last 72 hours. Hepatitis Panel No results for input(s): "HEPBSAG", "HCVAB", "HEPAIGM", "HEPBIGM" in the last 72 hours.    IMPRESSION:  #46 67 year old African-American male admitted 8 days ago with right lower extremity swelling and pain after a fall with right leg injury on 06/27/2023. Found to have a large fluid collection in the right thigh consistent with a hematoma on admission, in setting of chronic anticoagulation with Xarelto. He underwent right quadriceps rupture repair on 07/06/2023.  #2 acute right upper quadrant pain onset early this a.m.-described as intense constant and lasting a couple of hours, associated with burping and nausea and resolution. No recurrence thus far today  CT imaging is consistent with acute cholecystitis with multiple gallstones, distended gallbladder and pericholecystic inflammatory changes. He also has marked hyperbilirubinemia, elevated alkaline phosphatase and transaminases concerning for possible choledocholithiasis. He also could have hyperbilirubinemia secondary to resorption of his large right thigh hematoma   #3 insulin-dependent diabetes mellitus #4 history of atrial fibrillation-on chronic Xarelto #5 chronic kidney disease stage IIIa #6.  Hypertension #7.  Anemia multifactorial, in setting of recent right lower extremity injury and large right hematoma, then surgery  #8   2.8 cm right hepatic lobe lesion-indeterminate by CT recommended MRI for further assessment  PLAN: N.p.o. until after MRI/MRCP which has been ordered urgent Then would be okay for clear liquids, n.p.o. after midnight if MRI/MRCP positive(if ERCP would be indicated, may not be able  to be done tomorrow, this will need to be sorted out in a.m.).  I did discuss possibility of choledocholithiasis with the patient, showed him biliary images and reviewed the procedure in detail.  Repeat labs in a.m. Fractionated bilirubin Surgery will need to reevaluate  Hold Xarelto  GI will follow with you.   Marleen Moret PA-C 07/10/2023, 1:16 PM

## 2023-07-10 NOTE — Consult Note (Signed)
CC/Reason for consult: Recent RUQ pain, ?cholecystitis  Requesting physician: Susa Raring MD  HPI: Leroy Chen is an 67 y.o. male hx of paroxysmal A-fib status post DCCV in 2021 on Xarelto, history of first and second-degree AV block not requiring pacemaker placement, atrial tachycardia, insulin-dependent type 2 diabetes, CKD stage IIIa, hypertension, hyperlipidemia, former smoker, obesity, hidradenitis suppurativa, depression.   Admitted to [facility] for R thigh pain/edema and hematoma - following with orthopedics for this purpose  Developed acute RUQ pain by his account yesterday, never had this pain before. Pain was sharp and lasted hours. Overnight, it suddenly disappeared. He reports no abdominal pain now. We are asked to see for possible cholecystitis. He denies ever having experienced this pain in the past. No f/c/n/v.  He does have hx of Hartmann's and subsequent colostomy reversal for diverticulitis 2016 with Dr. Derrell Lolling.  Past Medical History:  Diagnosis Date   Atrial fibrillation [facility]) March 2015   Diabetes mellitus without complication (HCC)    NIDDM, type II   High cholesterol    Hydradenitis    Sebaceous cyst    hx of   Sigmoid diverticulitis    Wears glasses     Past Surgical History:  Procedure Laterality Date   COLECTOMY  10/15/14   hand sugery     ILEO LOOP COLOSTOMY CLOSURE N/A 04/27/2015   Procedure: DIAGNOSTIC LAPAROSCOPY,  EXPLORATORY LAPAROTOMY WITH RESECTION AND CLOSURE OF COLOSTOMY ;  Surgeon: [name], MD;  Location: [facility] OR;  Service: General;  Laterality: N/A;   LAPAROTOMY N/A 10/15/2014   Procedure: EXPLORATORY LAPAROTOMY WITH DRAINAGE OF INTRA- ABDOMINAL ABSCESS;  Surgeon: [name], MD;  Location: [facility] OR;  Service: General;  Laterality: N/A;   ORCHIECTOMY     PARTIAL COLECTOMY N/A 10/15/2014   Procedure: SIGMOID COLECTOMY WITH COLOSTOMY CREATION;  Surgeon: [name], MD;  Location: [facility] OR;  Service: General;  Laterality: N/A;    PILONIDAL CYST EXCISION N/A 01/01/2015   Procedure: EXCISION OF PILONIDAL DISEASE;  Surgeon: Romie Levee, MD;  Location: [facility] [place];  Service: General;  Laterality: N/A;   QUADRICEPS TENDON REPAIR Right 07/06/2023   Procedure: REPAIR RIGHT QUADRICEP TENDON;  Surgeon: Nadara Mustard, MD;  Location: [facility] OR;  Service: Orthopedics;  Laterality: Right;    Family History  Problem Relation Age of Onset   Healthy Mother     Social:  reports that he quit smoking about 6 years ago. His smoking use included cigarettes. He started smoking about 26 years ago. He has a 5 pack-year smoking history. He has never used smokeless tobacco. He reports current alcohol use. He reports that he does not use drugs.  Allergies:  Allergies  Allergen Reactions   Penicillins Swelling    Medications: I have reviewed the patient's current medications.  Results for orders placed or performed during the hospital encounter of 07/02/23 (from the past 48 hours)  Glucose, capillary     Status: Abnormal   Collection Time: 07/08/23 12:11 PM  Result Value Ref Range   Glucose-Capillary 202 (H) 70 - 99 mg/dL    Comment: Glucose reference range applies only to samples taken after fasting for at least 8 hours.  Glucose, capillary     Status: Abnormal   Collection Time: 07/08/23  4:11 PM  Result Value Ref Range   Glucose-Capillary 200 (H) 70 - 99 mg/dL    Comment: Glucose reference range applies only to samples taken after fasting for at least 8 hours.  Glucose, capillary  Status: Abnormal   Collection Time: 07/08/23  9:20 PM  Result Value Ref Range   Glucose-Capillary 149 (H) 70 - 99 mg/dL    Comment: Glucose reference range applies only to samples taken after fasting for at least 8 hours.  Procalcitonin     Status: None   Collection Time: 07/09/23  4:44 AM  Result Value Ref Range   Procalcitonin 0.35 ng/mL    Comment:        Interpretation: PCT (Procalcitonin) <= 0.5 ng/mL: Systemic infection  (sepsis) is not likely. Local bacterial infection is possible. (NOTE)       Sepsis PCT Algorithm           Lower Respiratory Tract                                      Infection PCT Algorithm    ----------------------------     ----------------------------         PCT < 0.25 ng/mL                PCT < 0.10 ng/mL          Strongly encourage             Strongly discourage   discontinuation of antibiotics    initiation of antibiotics    ----------------------------     -----------------------------       PCT 0.25 - 0.50 ng/mL            PCT 0.10 - 0.25 ng/mL               OR       >80% decrease in PCT            Discourage initiation of                                            antibiotics      Encourage discontinuation           of antibiotics    ----------------------------     -----------------------------         PCT >= 0.50 ng/mL              PCT 0.26 - 0.50 ng/mL               AND        <80% decrease in PCT             Encourage initiation of                                             antibiotics       Encourage continuation           of antibiotics    ----------------------------     -----------------------------        PCT >= 0.50 ng/mL                  PCT > 0.50 ng/mL               AND         increase in PCT  Strongly encourage                                      initiation of antibiotics    Strongly encourage escalation           of antibiotics                                     -----------------------------                                           PCT <= 0.25 ng/mL                                                 OR                                        > 80% decrease in PCT                                      Discontinue / Do not initiate                                             antibiotics  Performed at [facility], [address] [place], [place] [address]   CBC with Differential/Platelet     Status: Abnormal   Collection  Time: 07/09/23  4:44 AM  Result Value Ref Range   WBC 7.1 4.0 - 10.5 K/uL   RBC 2.61 (L) 4.22 - 5.81 MIL/uL   Hemoglobin 8.0 (L) 13.0 - 17.0 g/dL   HCT 52.8 (L) 41.3 - 24.4 %   MCV 96.6 80.0 - 100.0 fL   MCH 30.7 26.0 - 34.0 pg   MCHC 31.7 30.0 - 36.0 g/dL   RDW 01.0 27.2 - 53.6 %   Platelets 311 150 - 400 K/uL   nRBC 0.6 (H) 0.0 - 0.2 %   Neutrophils Relative % 61 %   Neutro Abs 4.4 1.7 - 7.7 K/uL   Lymphocytes Relative 16 %   Lymphs Abs 1.1 0.7 - 4.0 K/uL   Monocytes Relative 19 %   Monocytes Absolute 1.4 (H) 0.1 - 1.0 K/uL   Eosinophils Relative 2 %   Eosinophils Absolute 0.1 0.0 - 0.5 K/uL   Basophils Relative 1 %   Basophils Absolute 0.0 0.0 - 0.1 K/uL   Immature Granulocytes 1 %   Abs Immature Granulocytes 0.09 (H) 0.00 - 0.07 K/uL    Comment: Performed at [facility], [address], [place], [place] [address]  Basic metabolic panel     Status: Abnormal   Collection Time: 07/09/23  4:44 AM  Result Value Ref Range   Sodium 138 135 - 145 mmol/L   Potassium 3.6 3.5 - 5.1 mmol/L   Chloride 106  98 - 111 mmol/L   CO2 25 22 - 32 mmol/L   Glucose, Bld 142 (H) 70 - 99 mg/dL    Comment: Glucose reference range applies only to samples taken after fasting for at least 8 hours.   BUN 17 8 - 23 mg/dL   Creatinine, Ser 9.14 (H) 0.61 - 1.24 mg/dL   Calcium 8.4 (L) 8.9 - 10.3 mg/dL   GFR, Estimated 52 (L) >60 mL/min    Comment: (NOTE) Calculated using the CKD-EPI Creatinine Equation (2021)    Anion gap 7 5 - 15    Comment: Performed at [facility], [address], [place], [place] [address]  Glucose, capillary     Status: Abnormal   Collection Time: 07/09/23  8:28 AM  Result Value Ref Range   Glucose-Capillary 155 (H) 70 - 99 mg/dL    Comment: Glucose reference range applies only to samples taken after fasting for at least 8 hours.  Glucose, capillary     Status: Abnormal   Collection Time: 07/09/23 12:24 PM  Result Value Ref Range   Glucose-Capillary 127 (H) 70  - 99 mg/dL    Comment: Glucose reference range applies only to samples taken after fasting for at least 8 hours.  Glucose, capillary     Status: Abnormal   Collection Time: 07/09/23  3:53 PM  Result Value Ref Range   Glucose-Capillary 171 (H) 70 - 99 mg/dL    Comment: Glucose reference range applies only to samples taken after fasting for at least 8 hours.  Type and screen     Status: None   Collection Time: 07/09/23  6:55 PM  Result Value Ref Range   ABO/RH(D) A POS    Antibody Screen NEG    Sample Expiration      07/12/2023,2359 Performed at [facility], [address], [place], [place] [address]   Glucose, capillary     Status: Abnormal   Collection Time: 07/09/23  9:12 PM  Result Value Ref Range   Glucose-Capillary 182 (H) 70 - 99 mg/dL    Comment: Glucose reference range applies only to samples taken after fasting for at least 8 hours.  CBC with Differential/Platelet     Status: Abnormal   Collection Time: 07/10/23  7:57 AM  Result Value Ref Range   WBC 7.9 4.0 - 10.5 K/uL   RBC 3.03 (L) 4.22 - 5.81 MIL/uL   Hemoglobin 9.2 (L) 13.0 - 17.0 g/dL   HCT 86.5 (L) 78.4 - 69.6 %   MCV 94.7 80.0 - 100.0 fL   MCH 30.4 26.0 - 34.0 pg   MCHC 32.1 30.0 - 36.0 g/dL   RDW 29.5 28.4 - 13.2 %   Platelets 346 150 - 400 K/uL   nRBC 0.5 (H) 0.0 - 0.2 %   Neutrophils Relative % 80 %   Neutro Abs 6.3 1.7 - 7.7 K/uL   Lymphocytes Relative 5 %   Lymphs Abs 0.4 (L) 0.7 - 4.0 K/uL   Monocytes Relative 14 %   Monocytes Absolute 1.1 (H) 0.1 - 1.0 K/uL   Eosinophils Relative 0 %   Eosinophils Absolute 0.0 0.0 - 0.5 K/uL   Basophils Relative 0 %   Basophils Absolute 0.0 0.0 - 0.1 K/uL   Immature Granulocytes 1 %   Abs Immature Granulocytes 0.10 (H) 0.00 - 0.07 K/uL    Comment: Performed at [facility], [address] [place], [place] [address]  Comprehensive metabolic panel  Status: Abnormal   Collection Time: 07/10/23  7:57 AM  Result Value Ref Range   Sodium 134 (L) 135  - 145 mmol/L   Potassium 4.1 3.5 - 5.1 mmol/L   Chloride 102 98 - 111 mmol/L   CO2 18 (L) 22 - 32 mmol/L   Glucose, Bld 239 (H) 70 - 99 mg/dL    Comment: Glucose reference range applies only to samples taken after fasting for at least 8 hours.   BUN 15 8 - 23 mg/dL   Creatinine, Ser 0.10 (H) 0.61 - 1.24 mg/dL   Calcium 8.6 (L) 8.9 - 10.3 mg/dL   Total Protein 7.1 6.5 - 8.1 g/dL   Albumin 2.7 (L) 3.5 - 5.0 g/dL   AST 272 (H) 15 - 41 U/L   ALT 212 (H) 0 - 44 U/L   Alkaline Phosphatase 271 (H) 38 - 126 U/L   Total Bilirubin 13.2 (H) <1.2 mg/dL   GFR, Estimated 51 (L) >60 mL/min    Comment: (NOTE) Calculated using the CKD-EPI Creatinine Equation (2021)    Anion gap 14 5 - 15    Comment: Performed at [facility], [address] [place], [place] [address]  Lipase, blood     Status: Abnormal   Collection Time: 07/10/23  7:57 AM  Result Value Ref Range   Lipase 90 (H) 11 - 51 U/L    Comment: Performed at [facility], [address] [place], [place] [address]  Glucose, capillary     Status: Abnormal   Collection Time: 07/10/23  8:15 AM  Result Value Ref Range   Glucose-Capillary 222 (H) 70 - 99 mg/dL    Comment: Glucose reference range applies only to samples taken after fasting for at least 8 hours.    DG Abd Portable 1V Result Date: 07/10/2023 CLINICAL DATA:  Upper abdominal pain. EXAM: PORTABLE ABDOMEN - 1 VIEW COMPARISON:  04/23/2019. FINDINGS: The bowel gas pattern is normal. No radio-opaque calculi or other significant radiographic abnormality are seen. IMPRESSION: Negative. Electronically Signed   By: Layla Maw M.D.   On: 07/10/2023 09:43   CT ABDOMEN PELVIS W CONTRAST Result Date: 07/10/2023 CLINICAL DATA:  Right upper quadrant pain. EXAM: CT ABDOMEN AND PELVIS WITH CONTRAST TECHNIQUE: Multidetector CT imaging of the abdomen and pelvis was performed using the standard protocol following bolus administration of intravenous contrast. RADIATION DOSE  REDUCTION: This exam was performed according to the departmental dose-optimization program which includes automated exposure control, adjustment of the mA and/or kV according to patient size and/or use of iterative reconstruction technique. CONTRAST:  75mL OMNIPAQUE IOHEXOL 350 MG/ML SOLN COMPARISON:  11/10/2020 and 10/12/2014. FINDINGS: Lower chest: Linear bibasilar subsegmental atelectasis no or scarring. No pleural or pericardial effusion. Coronary atheromatous calcifications. Hepatobiliary: Hepatic lesion right lobe measuring 2.8 cm is indeterminate and new compared to old studies. This lesion can be assessed further with liver protocol CT or MRI. No intrahepatic ductal dilatation. Gallbladder is distended with pericholecystic inflammatory changes and numerous small calcified stones. The appearance is suggestive of acute cholecystitis. This can be confirmed with a HIDA scan, if indicated. Pancreas: Unremarkable. No pancreatic ductal dilatation or surrounding inflammatory changes. Spleen: Normal in size without focal abnormality. Adrenals/Urinary Tract: Right adrenal nodule measuring 2 cm is unchanged. No hydronephrosis or nephrolithiasis. Unremarkable urinary bladder. Stomach/Bowel: No bowel dilatation to suggest obstruction. No inflammatory mucosal process of the bowel to limits of CT evaluation. Diverticula in the descending and sigmoid colon. No evidence of diverticulitis. Vascular/Lymphatic: No  significant vascular findings are present. No enlarged abdominal or pelvic lymph nodes. Reproductive: Prominent prostate measures 6.3 x 5.7 x 4.7 cm. Left-sided reservoir for penile prosthesis noted. Other: Small periumbilical abdominal wall hernia containing omental fat without evidence of incarceration or herniated bowel. No abdominopelvic ascites. Musculoskeletal: Degenerative changes with vacuum discs at L1-2-3. IMPRESSION: 1. Findings suggestive of acute cholecystitis. 2. Indeterminate hepatic lesion. This can be  assessed further with liver protocol CT or MRI. 3. Stable right adrenal nodule. 4. Diverticulosis without diverticulitis. 5. Prostatomegaly. Electronically Signed   By: Layla Maw M.D.   On: 07/10/2023 09:42    ROS - all of the below systems have been reviewed with the patient and positives are indicated with bold text General: chills, fever or night sweats Eyes: blurry vision or double vision ENT: epistaxis or sore throat Allergy/Immunology: itchy/watery eyes or nasal congestion Hematologic/Lymphatic: bleeding problems, blood clots or swollen lymph nodes Endocrine: temperature intolerance or unexpected weight changes Breast: new or changing breast lumps or nipple discharge Resp: cough, shortness of breath, or wheezing CV: chest pain or dyspnea on exertion GI: as per HPI GU: dysuria, trouble voiding, or hematuria MSK: joint pain or joint stiffness - as per HPI Neuro: TIA or stroke symptoms Derm: pruritus and skin lesion changes Psych: anxiety and depression  PE Blood pressure (!) 161/63, pulse 95, temperature 99.2 F (37.3 C), temperature source Oral, resp. rate 20, height 6\' 1"  (1.854 m), weight 113.4 kg, SpO2 98%. Constitutional: NAD; conversant Eyes: Moist conjunctiva Lungs: Normal respiratory effort GI: Abd soft, nontender throughout, nondistended Psychiatric: Appropriate affect  Results for orders placed or performed during the hospital encounter of 07/02/23 (from the past 48 hours)  Glucose, capillary     Status: Abnormal   Collection Time: 07/08/23 12:11 PM  Result Value Ref Range   Glucose-Capillary 202 (H) 70 - 99 mg/dL    Comment: Glucose reference range applies only to samples taken after fasting for at least 8 hours.  Glucose, capillary     Status: Abnormal   Collection Time: 07/08/23  4:11 PM  Result Value Ref Range   Glucose-Capillary 200 (H) 70 - 99 mg/dL    Comment: Glucose reference range applies only to samples taken after fasting for at least 8 hours.   Glucose, capillary     Status: Abnormal   Collection Time: 07/08/23  9:20 PM  Result Value Ref Range   Glucose-Capillary 149 (H) 70 - 99 mg/dL    Comment: Glucose reference range applies only to samples taken after fasting for at least 8 hours.  Procalcitonin     Status: None   Collection Time: 07/09/23  4:44 AM  Result Value Ref Range   Procalcitonin 0.35 ng/mL    Comment:        Interpretation: PCT (Procalcitonin) <= 0.5 ng/mL: Systemic infection (sepsis) is not likely. Local bacterial infection is possible. (NOTE)       Sepsis PCT Algorithm           Lower Respiratory Tract                                      Infection PCT Algorithm    ----------------------------     ----------------------------         PCT < 0.25 ng/mL                PCT < 0.10 ng/mL  Strongly encourage             Strongly discourage   discontinuation of antibiotics    initiation of antibiotics    ----------------------------     -----------------------------       PCT 0.25 - 0.50 ng/mL            PCT 0.10 - 0.25 ng/mL               OR       >80% decrease in PCT            Discourage initiation of                                            antibiotics      Encourage discontinuation           of antibiotics    ----------------------------     -----------------------------         PCT >= 0.50 ng/mL              PCT 0.26 - 0.50 ng/mL               AND        <80% decrease in PCT             Encourage initiation of                                             antibiotics       Encourage continuation           of antibiotics    ----------------------------     -----------------------------        PCT >= 0.50 ng/mL                  PCT > 0.50 ng/mL               AND         increase in PCT                  Strongly encourage                                      initiation of antibiotics    Strongly encourage escalation           of antibiotics                                      -----------------------------                                           PCT <= 0.25 ng/mL                                                 OR                                        >  80% decrease in PCT                                      Discontinue / Do not initiate                                             antibiotics  Performed at [facility], [address] [place], [place] [address]   CBC with Differential/Platelet     Status: Abnormal   Collection Time: 07/09/23  4:44 AM  Result Value Ref Range   WBC 7.1 4.0 - 10.5 K/uL   RBC 2.61 (L) 4.22 - 5.81 MIL/uL   Hemoglobin 8.0 (L) 13.0 - 17.0 g/dL   HCT 60.4 (L) 54.0 - 98.1 %   MCV 96.6 80.0 - 100.0 fL   MCH 30.7 26.0 - 34.0 pg   MCHC 31.7 30.0 - 36.0 g/dL   RDW 19.1 47.8 - 29.5 %   Platelets 311 150 - 400 K/uL   nRBC 0.6 (H) 0.0 - 0.2 %   Neutrophils Relative % 61 %   Neutro Abs 4.4 1.7 - 7.7 K/uL   Lymphocytes Relative 16 %   Lymphs Abs 1.1 0.7 - 4.0 K/uL   Monocytes Relative 19 %   Monocytes Absolute 1.4 (H) 0.1 - 1.0 K/uL   Eosinophils Relative 2 %   Eosinophils Absolute 0.1 0.0 - 0.5 K/uL   Basophils Relative 1 %   Basophils Absolute 0.0 0.0 - 0.1 K/uL   Immature Granulocytes 1 %   Abs Immature Granulocytes 0.09 (H) 0.00 - 0.07 K/uL    Comment: Performed at [facility], [address] [place], [place] [address]  Basic metabolic panel     Status: Abnormal   Collection Time: 07/09/23  4:44 AM  Result Value Ref Range   Sodium 138 135 - 145 mmol/L   Potassium 3.6 3.5 - 5.1 mmol/L   Chloride 106 98 - 111 mmol/L   CO2 25 22 - 32 mmol/L   Glucose, Bld 142 (H) 70 - 99 mg/dL    Comment: Glucose reference range applies only to samples taken after fasting for at least 8 hours.   BUN 17 8 - 23 mg/dL   Creatinine, Ser 8.65 (H) 0.61 - 1.24 mg/dL   Calcium 8.4 (L) 8.9 - 10.3 mg/dL   GFR, Estimated 52 (L) >60 mL/min    Comment: (NOTE) Calculated using the CKD-EPI Creatinine Equation (2021)    Anion gap 7 5  - 15    Comment: Performed at [facility], [address] [place], [place] [address]  Glucose, capillary     Status: Abnormal   Collection Time: 07/09/23  8:28 AM  Result Value Ref Range   Glucose-Capillary 155 (H) 70 - 99 mg/dL    Comment: Glucose reference range applies only to samples taken after fasting for at least 8 hours.  Glucose, capillary     Status: Abnormal   Collection Time: 07/09/23 12:24 PM  Result Value Ref Range   Glucose-Capillary 127 (H) 70 - 99 mg/dL    Comment: Glucose reference range applies only to samples taken after fasting for at least 8 hours.  Glucose, capillary     Status: Abnormal   Collection Time: 07/09/23  3:53 PM  Result  Value Ref Range   Glucose-Capillary 171 (H) 70 - 99 mg/dL    Comment: Glucose reference range applies only to samples taken after fasting for at least 8 hours.  Type and screen     Status: None   Collection Time: 07/09/23  6:55 PM  Result Value Ref Range   ABO/RH(D) A POS    Antibody Screen NEG    Sample Expiration      07/12/2023,2359 Performed at [facility], [address], [place], [place] [address]   Glucose, capillary     Status: Abnormal   Collection Time: 07/09/23  9:12 PM  Result Value Ref Range   Glucose-Capillary 182 (H) 70 - 99 mg/dL    Comment: Glucose reference range applies only to samples taken after fasting for at least 8 hours.  CBC with Differential/Platelet     Status: Abnormal   Collection Time: 07/10/23  7:57 AM  Result Value Ref Range   WBC 7.9 4.0 - 10.5 K/uL   RBC 3.03 (L) 4.22 - 5.81 MIL/uL   Hemoglobin 9.2 (L) 13.0 - 17.0 g/dL   HCT 36.6 (L) 44.0 - 34.7 %   MCV 94.7 80.0 - 100.0 fL   MCH 30.4 26.0 - 34.0 pg   MCHC 32.1 30.0 - 36.0 g/dL   RDW 42.5 95.6 - 38.7 %   Platelets 346 150 - 400 K/uL   nRBC 0.5 (H) 0.0 - 0.2 %   Neutrophils Relative % 80 %   Neutro Abs 6.3 1.7 - 7.7 K/uL   Lymphocytes Relative 5 %   Lymphs Abs 0.4 (L) 0.7 - 4.0 K/uL   Monocytes Relative 14 %   Monocytes  Absolute 1.1 (H) 0.1 - 1.0 K/uL   Eosinophils Relative 0 %   Eosinophils Absolute 0.0 0.0 - 0.5 K/uL   Basophils Relative 0 %   Basophils Absolute 0.0 0.0 - 0.1 K/uL   Immature Granulocytes 1 %   Abs Immature Granulocytes 0.10 (H) 0.00 - 0.07 K/uL    Comment: Performed at [facility], [address], [place], [place] [address]  Comprehensive metabolic panel     Status: Abnormal   Collection Time: 07/10/23  7:57 AM  Result Value Ref Range   Sodium 134 (L) 135 - 145 mmol/L   Potassium 4.1 3.5 - 5.1 mmol/L   Chloride 102 98 - 111 mmol/L   CO2 18 (L) 22 - 32 mmol/L   Glucose, Bld 239 (H) 70 - 99 mg/dL    Comment: Glucose reference range applies only to samples taken after fasting for at least 8 hours.   BUN 15 8 - 23 mg/dL   Creatinine, Ser 2.95 (H) 0.61 - 1.24 mg/dL   Calcium 8.6 (L) 8.9 - 10.3 mg/dL   Total Protein 7.1 6.5 - 8.1 g/dL   Albumin 2.7 (L) 3.5 - 5.0 g/dL   AST 188 (H) 15 - 41 U/L   ALT 212 (H) 0 - 44 U/L   Alkaline Phosphatase 271 (H) 38 - 126 U/L   Total Bilirubin 13.2 (H) <1.2 mg/dL   GFR, Estimated 51 (L) >60 mL/min    Comment: (NOTE) Calculated using the CKD-EPI Creatinine Equation (2021)    Anion gap 14 5 - 15    Comment: Performed at [facility], [address], [place], [place] [address]  Lipase, blood     Status: Abnormal   Collection Time: 07/10/23  7:57 AM  Result Value Ref Range   Lipase 90 (H)  11 - 51 U/L    Comment: Performed at [facility], [address] [place], [place] [address]  Glucose, capillary     Status: Abnormal   Collection Time: 07/10/23  8:15 AM  Result Value Ref Range   Glucose-Capillary 222 (H) 70 - 99 mg/dL    Comment: Glucose reference range applies only to samples taken after fasting for at least 8 hours.    DG Abd Portable 1V Result Date: 07/10/2023 CLINICAL DATA:  Upper abdominal pain. EXAM: PORTABLE ABDOMEN - 1 VIEW COMPARISON:  04/23/2019. FINDINGS: The bowel gas pattern is normal. No radio-opaque  calculi or other significant radiographic abnormality are seen. IMPRESSION: Negative. Electronically Signed   By: Layla Maw M.D.   On: 07/10/2023 09:43   CT ABDOMEN PELVIS W CONTRAST Result Date: 07/10/2023 CLINICAL DATA:  Right upper quadrant pain. EXAM: CT ABDOMEN AND PELVIS WITH CONTRAST TECHNIQUE: Multidetector CT imaging of the abdomen and pelvis was performed using the standard protocol following bolus administration of intravenous contrast. RADIATION DOSE REDUCTION: This exam was performed according to the departmental dose-optimization program which includes automated exposure control, adjustment of the mA and/or kV according to patient size and/or use of iterative reconstruction technique. CONTRAST:  75mL OMNIPAQUE IOHEXOL 350 MG/ML SOLN COMPARISON:  11/10/2020 and 10/12/2014. FINDINGS: Lower chest: Linear bibasilar subsegmental atelectasis no or scarring. No pleural or pericardial effusion. Coronary atheromatous calcifications. Hepatobiliary: Hepatic lesion right lobe measuring 2.8 cm is indeterminate and new compared to old studies. This lesion can be assessed further with liver protocol CT or MRI. No intrahepatic ductal dilatation. Gallbladder is distended with pericholecystic inflammatory changes and numerous small calcified stones. The appearance is suggestive of acute cholecystitis. This can be confirmed with a HIDA scan, if indicated. Pancreas: Unremarkable. No pancreatic ductal dilatation or surrounding inflammatory changes. Spleen: Normal in size without focal abnormality. Adrenals/Urinary Tract: Right adrenal nodule measuring 2 cm is unchanged. No hydronephrosis or nephrolithiasis. Unremarkable urinary bladder. Stomach/Bowel: No bowel dilatation to suggest obstruction. No inflammatory mucosal process of the bowel to limits of CT evaluation. Diverticula in the descending and sigmoid colon. No evidence of diverticulitis. Vascular/Lymphatic: No significant vascular findings are present. No  enlarged abdominal or pelvic lymph nodes. Reproductive: Prominent prostate measures 6.3 x 5.7 x 4.7 cm. Left-sided reservoir for penile prosthesis noted. Other: Small periumbilical abdominal wall hernia containing omental fat without evidence of incarceration or herniated bowel. No abdominopelvic ascites. Musculoskeletal: Degenerative changes with vacuum discs at L1-2-3. IMPRESSION: 1. Findings suggestive of acute cholecystitis. 2. Indeterminate hepatic lesion. This can be assessed further with liver protocol CT or MRI. 3. Stable right adrenal nodule. 4. Diverticulosis without diverticulitis. 5. Prostatomegaly. Electronically Signed   By: Layla Maw M.D.   On: 07/10/2023 09:42    A/P: Leroy Chen is an 67 y.o. male with hx afib, HTN, HLD, DM, CKD-we are asked to see for the possibility of acute cholecystitis  - Interestingly, he has had complete resolution of all of the symptoms which appear to have been short-lived in nature.  Seems unlikely that he actually would have acute cholecystitis without any symptoms or pain on exam at present. Could have choledocholithiasis. Given imaging findings of cholecystitis, would empirically cover with IV abx for now  - Given his significant hyperbilirubinemia, would certainly recommend GI evaluation. MRCP may help clarify his 3 cm liver lesion  - We will follow with you  I spent a total of 60 minutes in both face-to-face and non-face-to-face activities, excluding procedures performed, for  this visit on the date of this encounter.  Marin Olp, MD [facility], [facility]

## 2023-07-10 NOTE — Plan of Care (Signed)
  Problem: Fluid Volume: Goal: Ability to maintain a balanced intake and output will improve Outcome: Progressing   Problem: Health Behavior/Discharge Planning: Goal: Ability to manage health-related needs will improve Outcome: Progressing   Problem: Nutritional: Goal: Maintenance of adequate nutrition will improve Outcome: Progressing   Problem: Clinical Measurements: Goal: Will remain free from infection Outcome: Progressing

## 2023-07-10 NOTE — Progress Notes (Addendum)
PROGRESS NOTE                                                                                                                                                                                                             Patient Demographics:    Leroy Chen, is a 67 y.o. male, DOB - 14-Oct-1955, ION:[id number]  Outpatient Primary MD for the patient is [name]    LOS - 7  Admit date - 07/02/2023    Chief Complaint  Patient presents with   Leg Pain       Brief Narrative (HPI from H&P)    67 y.o. male with medical history significant of paroxysmal A-fib status post DCCV in 2021 on Xarelto, history of first and second-degree AV block not requiring pacemaker placement, atrial tachycardia, insulin-dependent type 2 diabetes, CKD stage IIIa, hypertension, hyperlipidemia, former smoker, obesity, hidradenitis suppurativa, depression.  Patient was recently seen in the ED on 06/27/23 for fall and right leg injury.  X-ray of the femur showed no evidence of fracture.  His pain was felt to be related to right quadriceps tendon injury.  He was made nonweightbearing on crutches, knee immobilizer placed, and was discharged with plan for orthopedic follow-up.   Continue to have swelling of his right leg with right thigh pain and discomfort came to the ER where CT scan showed very large amount of fluid collection in his right lower extremity in the thigh area, he was admitted for further care.  Further workup suggest that he might have a very large hematoma in his right thigh due to being on Xarelto and having a soft tissue injury.   Subjective:   Patient in bed, appears comfortable, denies any headache, no fever, no chest pain or pressure, no shortness of breath ,  No new focal weakness.  His right lower extremity pain is better however overnight he has developed quite excruciating right upper quadrant and epigastric abdominal  pain.   Assessment  & Plan :    Right lower extremity soft tissue injury with fall 1 week ago with right lower extremity swelling and possible secondary cellulitis and foreign body did on CT scan.  He was diagnosed with severe sepsis at the time of admission as well.  I think patient's main issue is right thigh soft tissue hematoma which he incurred due to  his severe fall 1 week prior to admission in the setting of him being on Xarelto, CT scan has been noted.  He is already been placed on antibiotics for possible secondary cellulitis and sepsis this has been clinically ruled out.  Orthopedics was consulted, MRI of the right leg obtained with large quadriceps tear and patellar injury and soft tissue hematoma, case discussed with Dr. Lajoyce Corners on 07/05/2023, stopped antibiotics as this is not sepsis or infection, he underwent surgical correction on 07/07/2023 by Dr. Lajoyce Corners of quadriceps tendon repair.  Tolerated the procedure well.  Per Ortho. Patient may be weightbearing as tolerated with the knee immobilizer. I will follow-up with him in 2 weeks in the office. He can discharge to home as soon as he is safe with therapy.  Initiate PT OT, prophylactic dose heparin on 07/07/2023 if tolerates well than home dose Xarelto on 07/08/2023 and discharge home with home PT OT as soon as his strength has improved.   Acute right upper quadrant abdominal pain starting night of 07/09/2023.  LFTs and total bilirubin are high, question if he has a CBD stone or has passed a CBD stone, still has his gallbladder, check CT abdomen pelvis stat, n.p.o. except medications, GI General Surgery consulted.  Monitor for any signs of infection.  Sinus tachycardia - Due to #1 above supportive care and pain control. Improved.   Normocytic anemia - Hemoglobin currently 11.1, was 16.5 in July 2024.  Patient is chronically anticoagulated although denies any symptoms of GI bleed.  Acute anemia is clearly due to soft tissue hematoma and  blood loss in the right lower extremity.  Monitor.  Type screen also done.  No signs of GI bleed continue PPI.  No further signs of bleeding postop, has tolerated prophylactic heparin well on 07/08/2023, we do expect CBC to fall a little bit on 07/09/2023 from the fluids he is going to get on 07/08/2023.    AKI on CKD stage IIIa - Likely prerenal in etiology in the setting of blood loss, hydrate on 07/08/2023 and monitor.  Expect some drop in H&H due to heme dilution.  Paroxysmal A-fib status post DCCV in 2021 - History of atrial tachycardia: Rate is controlled  - Continue  metoprolol, and flecainide.  Started prophylactic heparin on 07/07/2023 has tolerated it well will switch to Xarelto at his home dose on 07/08/2023. Elevated total bilirubin  Due to soft tissue hematoma and reabsorption of blood, monitor. Recheck.   Hypertension  Blood pressure only on the soft side continue to hold ARB, reduce beta-blocker and hydrate on 07/08/2023.   Hyperlipidemia  Continue Crestor.   Depression  Continue Zoloft.   GERD  Continue Protonix.  Mild hyponatremia  Gentle hydration   High anion gap metabolic acidosis  - he takes Jardiance and had developed euglycemic DKA which has resolved after insulin drip, transition to subcu insulin and sliding scale, discontinue Jardiance permanently upon discharge.    Weakness and deconditioning.  PT OT, ambulate in the hallway, sit in chair in the daytime, prepare for home discharge on 07/10/2023 if he does well with PT.  He still feels extremely weak overall.    Insulin-dependent type 2 diabetes -on long-acting insulin twice a day along with sliding scale, dose adjusted while he is n.p.o. on 07/10/2023.   Lab Results  Component Value Date   HGBA1C 7.6 (H) 07/03/2023   CBG (last 3)  Recent Labs    07/09/23 1553 07/09/23 2112 07/10/23 0815  GLUCAP 171* 182* 222*  Condition - Fair  Family Communication  : Wife [phone]  on 07/04/2023 at 7:53  AM, 07/08/2023  Code Status :  Full  Consults  :  Ortho, GI, general surgery  PUD Prophylaxis : PPI   Procedures  :     CT Abd - Pelvis -   MRI.  Right lower extremity.    1. High-grade partial tear of the distal quadriceps tendon, involving the superficial central fibers arising from the rectus femoris tendon as well as some of the medial fibers arising from the vastus medialis. The deep central and lateral fibers appear intact. 2. Large amount of heterogeneous soft tissue swelling anteriorly in the distal thigh, likely representing soft tissue hematoma. 3. Possible partial tear of the medial patellofemoral ligament at its patellar attachment. 4. The menisci, cruciate and collateral ligaments appear intact. 5. No evidence of acute fracture or osteonecrosis  Lower extremity venous duplex.  No DVT.    CT right hip.  No fracture.    CT scan tibia-fibula right leg.   1. Subcutaneous edema throughout the mid and lower thigh surrounding the knee. No evidence for soft tissue gas or fluid collection. 2. No acute osseous abnormality. 3. Thin linear radiopaque foreign body measuring 1 cm within the lateral subcutaneous tissues of the distal right lower extremity.      Disposition Plan  :    Status is: Inpatient   DVT Prophylaxis  :    SCDs Start: 07/06/23 1801 SCDs Start: 07/04/23 0813 Place and maintain sequential compression device Start: 07/04/23 0718 rivaroxaban (XARELTO) tablet 20 mg   Lab Results  Component Value Date   PLT 346 07/10/2023    Diet :  Diet Order             Diet NPO time specified Except for: Sips with Meds  Diet effective now                    Inpatient Medications  Scheduled Meds:  docusate sodium  100 mg Oral BID   flecainide  50 mg Oral BID   insulin aspart  0-15 Units Subcutaneous TID WC   insulin aspart  0-5 Units Subcutaneous QHS   insulin glargine-yfgn  25 Units Subcutaneous BID   metoprolol tartrate  25 mg Oral BID   pantoprazole   40 mg Oral Daily   rivaroxaban  20 mg Oral Q supper   rosuvastatin  40 mg Oral Daily   sertraline  100 mg Oral Daily   Continuous Infusions:    PRN Meds:.acetaminophen, albuterol, bisacodyl, dicyclomine, HYDROmorphone (DILAUDID) injection, magnesium citrate, methocarbamol **OR** [DISCONTINUED] methocarbamol (ROBAXIN) injection, metoCLOPramide **OR** [DISCONTINUED] metoCLOPramide (REGLAN) injection, naLOXone (NARCAN)  injection, [DISCONTINUED] ondansetron **OR** ondansetron (ZOFRAN) IV, oxyCODONE, polyethylene glycol    Objective:   Vitals:   07/09/23 2127 07/09/23 2300 07/10/23 0346 07/10/23 0816  BP: (!) 161/55 (!) 170/72 (!) 155/71 (!) 161/63  Pulse: 91 85 95   Resp:   16 20  Temp:  98.5 F (36.9 C) 98.8 F (37.1 C) 99.2 F (37.3 C)  TempSrc:  Oral Oral Oral  SpO2:  98% 98% 98%  Weight:      Height:        Wt Readings from Last 3 Encounters:  07/02/23 113.4 kg  06/27/23 113.4 kg  02/12/23 113 kg     Intake/Output Summary (Last 24 hours) at 07/10/2023 0934 Last data filed at 07/10/2023 0800 Gross per 24 hour  Intake --  Output 1750 ml  Net -1750  ml     Physical Exam  Awake Alert, No new F.N deficits, Normal affect [place].AT,PERRAL Supple Neck, No JVD,   Symmetrical Chest wall movement, Good air movement bilaterally, CTAB RRR,No Gallops, Rubs or new Murmurs,  +ve B.Sounds, Abd Soft, ++ RUQ and epigastric tenderness,   Right knee and leg in knee immobilizer   Data Review:    Recent Labs  Lab 07/06/23 0237 07/07/23 0500 07/08/23 0439 07/09/23 0444 07/10/23 0757  WBC 7.6 8.1 8.5 7.1 7.9  HGB 8.4* 8.5* 8.3* 8.0* 9.2*  HCT 26.0* 26.7* 26.8* 25.2* 28.7*  PLT 246 262 262 311 346  MCV 95.2 97.8 98.9 96.6 94.7  MCH 30.8 31.1 30.6 30.7 30.4  MCHC 32.3 31.8 31.0 31.7 32.1  RDW 15.5 15.5 15.2 15.1 15.2  LYMPHSABS 1.6 0.9 1.1 1.1 0.4*  MONOABS 1.2* 1.2* 1.6* 1.4* 1.1*  EOSABS 0.1 0.1 0.1 0.1 0.0  BASOSABS 0.0 0.0 0.0 0.0 0.0    Recent Labs  Lab  07/04/23 0834 07/04/23 1445 07/05/23 0459 07/06/23 0237 07/07/23 0500 07/08/23 0439 07/09/23 0444 07/10/23 0757  NA  --    < > 137 137 138 138 138 134*  K  --    < > 4.4 3.6 4.7 4.8 3.6 4.1  CL  --    < > 110 109 105 106 106 102  CO2  --    < > 20* 20* 22 25 25  18*  ANIONGAP  --    < > 7 8 11 7 7 14   GLUCOSE  --    < > 118* 132* 157* 162* 142* 239*  BUN  --    < > 17 19 18 19 17 15   CREATININE  --    < > 1.22 1.47* 1.21 1.84* 1.48* 1.49*  AST  --   --   --   --   --   --   --  407*  ALT  --   --   --   --   --   --   --  212*  ALKPHOS  --   --   --   --   --   --   --  271*  BILITOT  --   --   --   --   --   --   --  13.2*  ALBUMIN  --   --   --   --   --   --   --  2.7*  CRP 7.4*  --  9.5* 12.9* 17.7* 24.6*  --   --   PROCALCITON 0.12  --  0.11 0.13 0.14 0.47 0.35  --   INR 1.5*  --   --   --   --   --   --   --   BNP 353.6*  --  348.2* 98.5 74.8 90.7  --   --   MG 2.3  --  2.3 2.3 2.2 2.2  --   --   CALCIUM  --    < > 7.9* 8.1* 8.6* 8.6* 8.4* 8.6*   < > = values in this interval not displayed.      Recent Labs  Lab 07/04/23 0834 07/04/23 1445 07/05/23 0459 07/06/23 0237 07/07/23 0500 07/08/23 0439 07/09/23 0444 07/10/23 0757  CRP 7.4*  --  9.5* 12.9* 17.7* 24.6*  --   --   PROCALCITON 0.12  --  0.11 0.13 0.14 0.47 0.35  --   INR 1.5*  --   --   --   --   --   --   --  BNP 353.6*  --  348.2* 98.5 74.8 90.7  --   --   MG 2.3  --  2.3 2.3 2.2 2.2  --   --   CALCIUM  --    < > 7.9* 8.1* 8.6* 8.6* 8.4* 8.6*   < > = values in this interval not displayed.    --------------------------------------------------------------------------------------------------------------- No results found for: "CHOL", "HDL", "LDLCALC", "LDLDIRECT", "TRIG", "CHOLHDL"  Lab Results  Component Value Date   HGBA1C 7.6 (H) 07/03/2023     Micro Results Recent Results (from the past 240 hours)  Resp panel by RT-PCR (RSV, Flu A&B, Covid) Anterior Nasal Swab     Status: None   Collection  Time: 07/02/23  4:59 PM   Specimen: Anterior Nasal Swab  Result Value Ref Range Status   SARS Coronavirus 2 by RT PCR NEGATIVE NEGATIVE Final   Influenza A by PCR NEGATIVE NEGATIVE Final   Influenza B by PCR NEGATIVE NEGATIVE Final    Comment: (NOTE) The Xpert Xpress SARS-CoV-2/FLU/RSV plus assay is intended as an aid in the diagnosis of influenza from Nasopharyngeal swab specimens and should not be used as a sole basis for treatment. Nasal washings and aspirates are unacceptable for Xpert Xpress SARS-CoV-2/FLU/RSV testing.  Fact Sheet for Patients: BloggerCourse.com  Fact Sheet for Healthcare Providers: SeriousBroker.it  This test is not yet approved or cleared by the [place] FDA and has been authorized for detection and/or diagnosis of SARS-CoV-2 by FDA under an Emergency Use Authorization (EUA). This EUA will remain in effect (meaning this test can be used) for the duration of the COVID-19 declaration under Section 564(b)(1) of the Act, 21 U.S.C. section 360bbb-3(b)(1), unless the authorization is terminated or revoked.     Resp Syncytial Virus by PCR NEGATIVE NEGATIVE Final    Comment: (NOTE) Fact Sheet for Patients: BloggerCourse.com  Fact Sheet for Healthcare Providers: SeriousBroker.it  This test is not yet approved or cleared by the [place] FDA and has been authorized for detection and/or diagnosis of SARS-CoV-2 by FDA under an Emergency Use Authorization (EUA). This EUA will remain in effect (meaning this test can be used) for the duration of the COVID-19 declaration under Section 564(b)(1) of the Act, 21 U.S.C. section 360bbb-3(b)(1), unless the authorization is terminated or revoked.  Performed at [facility], [address] [place], [place] [address]   Blood culture (routine x 2)     Status: None   Collection Time: 07/02/23  8:24 PM    Specimen: BLOOD RIGHT HAND  Result Value Ref Range Status   Specimen Description BLOOD RIGHT HAND  Final   Special Requests   Final    BOTTLES DRAWN AEROBIC ONLY Blood Culture results may not be optimal due to an inadequate volume of blood received in culture bottles   Culture   Final    NO GROWTH 5 DAYS Performed at [facility], [address], [place], [place] [address]    Report Status 07/07/2023 FINAL  Final  Blood culture (routine x 2)     Status: None   Collection Time: 07/02/23  8:35 PM   Specimen: BLOOD  Result Value Ref Range Status   Specimen Description BLOOD SITE NOT SPECIFIED  Final   Special Requests   Final    BOTTLES DRAWN AEROBIC ONLY Blood Culture results may not be optimal due to an inadequate volume of blood received in culture bottles   Culture   Final    NO GROWTH 5 DAYS Performed  at [facility], [address] [place], [place] [address]    Report Status 07/07/2023 FINAL  Final  MRSA Next Gen by PCR, Nasal     Status: None   Collection Time: 07/04/23 11:24 AM   Specimen: Nasal Mucosa; Nasal Swab  Result Value Ref Range Status   MRSA by PCR Next Gen NOT DETECTED NOT DETECTED Final    Comment: (NOTE) The GeneXpert MRSA Assay (FDA approved for NASAL specimens only), is one component of a comprehensive MRSA colonization surveillance program. It is not intended to diagnose MRSA infection nor to guide or monitor treatment for MRSA infections. Test performance is not FDA approved in patients less than 40 years old. Performed at [facility], [address], [place], [place] [address]   Surgical PCR screen     Status: None   Collection Time: 07/06/23  8:23 AM   Specimen: Nasal Mucosa; Nasal Swab  Result Value Ref Range Status   MRSA, PCR NEGATIVE NEGATIVE Final   Staphylococcus aureus NEGATIVE NEGATIVE Final    Comment: (NOTE) The Xpert SA Assay (FDA approved for NASAL specimens in patients 64 years of age and older), is one component of  a comprehensive surveillance program. It is not intended to diagnose infection nor to guide or monitor treatment. Performed at [facility], [address], [place], [place] [address]     Radiology Reports No results found.     Signature  -   Susa Raring M.D on 07/10/2023 at 9:34 AM   -  To page go to [email or web address]

## 2023-07-10 NOTE — Progress Notes (Signed)
Physical Therapy Treatment Patient Details Name: Leroy Chen MRN: [id number] DOB: [birth year] Today's Date: 07/10/2023   History of Present Illness 67 y.o. male presenting to [facility] on 07/02/23 for RLE pain and swelling after a fall awaiting quadriceps tendon surgery. Patient was recently seen in the ED on 06/27/23 for fall and right leg injury.  X-ray of the femur showed no evidence of fracture.  His pain was felt to be related to right quadriceps tendon injury.  He was made nonweightbearing on crutches, knee immobilizer placed. Past medical history significant of paroxysmal A-fib status post DCCV in 2021 on Xarelto, history of first and second-degree AV block not requiring pacemaker placement, atrial tachycardia, insulin-dependent type 2 diabetes, CKD stage IIIa, hypertension, hyperlipidemia, former smoker, obesity, hidradenitis suppurativa, depression. Pt is now s/p quad tendon repair on 12/20.    PT Comments  Pt tolerated treatment well today. Pt today was able to progress ambulation in hallway with RW supervision. Pt also able to navigate stairs today with CGA. No change in DC/DME recs at this time. PT will continue to follow.     If plan is discharge home, recommend the following: A little help with walking and/or transfers;A little help with bathing/dressing/bathroom;Assistance with cooking/housework;Assist for transportation;Help with stairs or ramp for entrance   Can travel by private vehicle        Equipment Recommendations  Rolling walker (2 wheels)    Recommendations for Other Services       Precautions / Restrictions Precautions Precautions: Fall Required Braces or Orthoses: Knee Immobilizer - Right Knee Immobilizer - Right: On at all times Restrictions Weight Bearing Restrictions Per Provider Order: Yes RLE Weight Bearing Per Provider Order: Weight bearing as tolerated     Mobility  Bed Mobility Overal bed mobility: Needs Assistance Bed Mobility: Supine to Sit      Supine to sit: Supervision     General bed mobility comments: Pt able to use LLE to assist RLE off bed.    Transfers Overall transfer level: Needs assistance Equipment used: Rolling walker (2 wheels) Transfers: Sit to/from Stand Sit to Stand: Supervision           General transfer comment: Good hand placement    Ambulation/Gait Ambulation/Gait assistance: Supervision Gait Distance (Feet): 200 Feet Assistive device: Rolling walker (2 wheels) Gait Pattern/deviations: Decreased stride length, Step-through pattern (Knee locked in enxtension with KI.) Gait velocity: decreased     General Gait Details: Pt today was able to ambulate with good speed in hallway today. Chair follow provided however not needed.   Stairs Stairs: Yes Stairs assistance: Contact guard assist Stair Management: Two rails, Step to pattern, Forwards Number of Stairs: 4 General stair comments: no LOB noted.   Wheelchair Mobility     Tilt Bed    Modified Rankin (Stroke Patients Only)       Balance Overall balance assessment: Needs assistance Sitting-balance support: Feet supported, No upper extremity supported Sitting balance-Leahy Scale: Fair     Standing balance support: Bilateral upper extremity supported, During functional activity, Reliant on assistive device for balance Standing balance-Leahy Scale: Poor Standing balance comment: reliant on RW support                            Cognition Arousal: Alert Behavior During Therapy: WFL for tasks assessed/performed Overall Cognitive Status: Within Functional Limits for tasks assessed  Exercises      General Comments General comments (skin integrity, edema, etc.): VSS on RA      Pertinent Vitals/Pain Pain Assessment Pain Assessment: Faces Faces Pain Scale: Hurts a little bit Pain Location: R patella thoughout quad Pain Descriptors / Indicators: Aching,  Discomfort, Guarding, Grimacing Pain Intervention(s): Monitored during session, RN gave pain meds during session    Home Living                          Prior Function            PT Goals (current goals can now be found in the care plan section) Progress towards PT goals: Progressing toward goals    Frequency    Min 1X/week      PT Plan      Co-evaluation              AM-PAC PT "6 Clicks" Mobility   Outcome Measure  Help needed turning from your back to your side while in a flat bed without using bedrails?: A Little Help needed moving from lying on your back to sitting on the side of a flat bed without using bedrails?: A Little Help needed moving to and from a bed to a chair (including a wheelchair)?: A Little Help needed standing up from a chair using your arms (e.g., wheelchair or bedside chair)?: A Little Help needed to walk in hospital room?: A Lot Help needed climbing 3-5 steps with a railing? : Total 6 Click Score: 15    End of Session Equipment Utilized During Treatment: Gait belt Activity Tolerance: Patient tolerated treatment well Patient left: in bed;with call bell/phone within reach Nurse Communication: Mobility status PT Visit Diagnosis: Other abnormalities of gait and mobility (R26.89)     Time: 1610-9604 PT Time Calculation (min) (ACUTE ONLY): 22 min  Charges:    $Gait Training: 8-22 mins PT General Charges $$ ACUTE PT VISIT: 1 Visit                    Shela Nevin, PT, DPT Acute Rehab Services [phone]    Gladys Damme 07/10/2023, 11:27 AM

## 2023-07-11 DIAGNOSIS — L03115 Cellulitis of right lower limb: Secondary | ICD-10-CM | POA: Diagnosis not present

## 2023-07-11 DIAGNOSIS — R933 Abnormal findings on diagnostic imaging of other parts of digestive tract: Secondary | ICD-10-CM

## 2023-07-11 DIAGNOSIS — K838 Other specified diseases of biliary tract: Secondary | ICD-10-CM

## 2023-07-11 DIAGNOSIS — Z7901 Long term (current) use of anticoagulants: Secondary | ICD-10-CM

## 2023-07-11 DIAGNOSIS — R748 Abnormal levels of other serum enzymes: Secondary | ICD-10-CM

## 2023-07-11 DIAGNOSIS — K769 Liver disease, unspecified: Secondary | ICD-10-CM

## 2023-07-11 LAB — GLUCOSE, CAPILLARY
Glucose-Capillary: 143 mg/dL — ABNORMAL HIGH (ref 70–99)
Glucose-Capillary: 144 mg/dL — ABNORMAL HIGH (ref 70–99)
Glucose-Capillary: 148 mg/dL — ABNORMAL HIGH (ref 70–99)
Glucose-Capillary: 153 mg/dL — ABNORMAL HIGH (ref 70–99)
Glucose-Capillary: 155 mg/dL — ABNORMAL HIGH (ref 70–99)
Glucose-Capillary: 160 mg/dL — ABNORMAL HIGH (ref 70–99)

## 2023-07-11 LAB — CBC WITH DIFFERENTIAL/PLATELET
Abs Immature Granulocytes: 0.14 10*3/uL — ABNORMAL HIGH (ref 0.00–0.07)
Basophils Absolute: 0.1 10*3/uL (ref 0.0–0.1)
Basophils Relative: 1 %
Eosinophils Absolute: 0.1 10*3/uL (ref 0.0–0.5)
Eosinophils Relative: 1 %
HCT: 28.6 % — ABNORMAL LOW (ref 39.0–52.0)
Hemoglobin: 8.9 g/dL — ABNORMAL LOW (ref 13.0–17.0)
Immature Granulocytes: 2 %
Lymphocytes Relative: 12 %
Lymphs Abs: 1 10*3/uL (ref 0.7–4.0)
MCH: 29.8 pg (ref 26.0–34.0)
MCHC: 31.1 g/dL (ref 30.0–36.0)
MCV: 95.7 fL (ref 80.0–100.0)
Monocytes Absolute: 1.3 10*3/uL — ABNORMAL HIGH (ref 0.1–1.0)
Monocytes Relative: 15 %
Neutro Abs: 5.7 10*3/uL (ref 1.7–7.7)
Neutrophils Relative %: 69 %
Platelets: 350 10*3/uL (ref 150–400)
RBC: 2.99 MIL/uL — ABNORMAL LOW (ref 4.22–5.81)
RDW: 15.8 % — ABNORMAL HIGH (ref 11.5–15.5)
WBC: 8.3 10*3/uL (ref 4.0–10.5)
nRBC: 1 % — ABNORMAL HIGH (ref 0.0–0.2)

## 2023-07-11 LAB — COMPREHENSIVE METABOLIC PANEL
ALT: 565 U/L — ABNORMAL HIGH (ref 0–44)
AST: 1023 U/L — ABNORMAL HIGH (ref 15–41)
Albumin: 2.4 g/dL — ABNORMAL LOW (ref 3.5–5.0)
Alkaline Phosphatase: 376 U/L — ABNORMAL HIGH (ref 38–126)
Anion gap: 14 (ref 5–15)
BUN: 14 mg/dL (ref 8–23)
CO2: 21 mmol/L — ABNORMAL LOW (ref 22–32)
Calcium: 8.9 mg/dL (ref 8.9–10.3)
Chloride: 104 mmol/L (ref 98–111)
Creatinine, Ser: 1.65 mg/dL — ABNORMAL HIGH (ref 0.61–1.24)
GFR, Estimated: 46 mL/min — ABNORMAL LOW (ref >60)
Glucose, Bld: 178 mg/dL — ABNORMAL HIGH (ref 70–99)
Potassium: 3.8 mmol/L (ref 3.5–5.1)
Sodium: 139 mmol/L (ref 135–145)
Total Bilirubin: 18.3 mg/dL (ref <1.2)
Total Protein: 6.9 g/dL (ref 6.5–8.1)

## 2023-07-11 LAB — HEPATITIS PANEL, ACUTE
HCV Ab: NONREACTIVE
Hep A IgM: NONREACTIVE
Hep B C IgM: NONREACTIVE
Hepatitis B Surface Ag: NONREACTIVE

## 2023-07-11 LAB — PROCALCITONIN: Procalcitonin: 0.7 ng/mL

## 2023-07-11 LAB — APTT: aPTT: 58 s — ABNORMAL HIGH (ref 24–36)

## 2023-07-11 LAB — C-REACTIVE PROTEIN: CRP: 17.8 mg/dL — ABNORMAL HIGH (ref <1.0)

## 2023-07-11 LAB — HEPARIN LEVEL (UNFRACTIONATED): Heparin Unfractionated: 0.41 [IU]/mL (ref 0.30–0.70)

## 2023-07-11 LAB — MAGNESIUM: Magnesium: 2.3 mg/dL (ref 1.7–2.4)

## 2023-07-11 MED ORDER — LACTATED RINGERS IV SOLN
INTRAVENOUS | Status: AC
Start: 2023-07-11 — End: 2023-07-12

## 2023-07-11 MED ORDER — POTASSIUM CHLORIDE CRYS ER 20 MEQ PO TBCR
20.0000 meq | EXTENDED_RELEASE_TABLET | Freq: Once | ORAL | Status: AC
  Administered 2023-07-11: 20 meq via ORAL
  Filled 2023-07-11: qty 1

## 2023-07-11 MED ORDER — HEPARIN (PORCINE) 25000 UT/250ML-% IV SOLN
1400.0000 [IU]/h | INTRAVENOUS | Status: AC
  Administered 2023-07-11: 1400 [IU]/h via INTRAVENOUS
  Filled 2023-07-11 (×2): qty 250

## 2023-07-11 NOTE — Progress Notes (Signed)
PHARMACY - ANTICOAGULATION CONSULT NOTE  Pharmacy Consult for Heparin Indication: atrial fibrillation  Allergies  Allergen Reactions   Penicillins Swelling    Patient Measurements: Height: 6\' 1"  (185.4 cm) Weight: 113.4 kg (250 lb) IBW/kg (Calculated) : 79.9 Heparin Dosing Weight: 104 kg   Vital Signs: Temp: 98 F (36.7 C) (12/25 1613) Temp Source: Oral (12/25 1612) BP: 133/64 (12/25 1612)  Labs: Recent Labs    07/09/23 0444 07/10/23 0757 07/10/23 1504 07/11/23 0504 07/11/23 1640  HGB 8.0* 9.2*  --  8.9*  --   HCT 25.2* 28.7*  --  28.6*  --   PLT 311 346  --  350  --   APTT  --   --   --   --  58*  LABPROT  --   --  25.0*  --   --   INR  --   --  2.2*  --   --   HEPARINUNFRC  --   --   --   --  0.41  CREATININE 1.48* 1.49*  --  1.65*  --     Estimated Creatinine Clearance: 58.1 mL/min (A) (by C-G formula based on SCr of 1.65 mg/dL (H)).   Medical History: Past Medical History:  Diagnosis Date   Atrial fibrillation [facility]) March 2015   Diabetes mellitus without complication (HCC)    NIDDM, type II   High cholesterol    Hydradenitis    Sebaceous cyst    hx of   Sigmoid diverticulitis    Wears glasses     Medications:  Scheduled:   docusate sodium  100 mg Oral BID   flecainide  50 mg Oral BID   insulin aspart  0-15 Units Subcutaneous Q6H   insulin glargine-yfgn  15 Units Subcutaneous BID   metoprolol tartrate  25 mg Oral BID   pantoprazole  40 mg Oral Daily   sertraline  100 mg Oral Daily   Infusions:   cefTRIAXone (ROCEPHIN)  IV 2 g (07/11/23 1234)   heparin 1,400 Units/hr (07/11/23 1232)   lactated ringers 100 mL/hr at 07/11/23 1403   metronidazole 500 mg (07/11/23 1357)   PRN: acetaminophen, albuterol, bisacodyl, dicyclomine, methocarbamol **OR** [DISCONTINUED] methocarbamol (ROBAXIN) injection, metoCLOPramide **OR** [DISCONTINUED] metoCLOPramide (REGLAN) injection, naLOXone (NARCAN)  injection, [DISCONTINUED] ondansetron **OR** ondansetron  (ZOFRAN) IV, oxyCODONE, polyethylene glycol  Assessment: 67 y.o. male with medical history significant of paroxysmal A-fib status post DCCV in 2021 on Xarelto PTA, history of first and second-degree AV block not requiring pacemaker placement, and atrial tachycardia. Pharmacy was consulted for heparin dosing without a bolus. Patient has possibility of acute cholecystitis and per GI, possible procedure coming up so holding Xarelto currently. Patient's last dose of Xarelto was on 12/23.   Will check a heparin level and aPTT for a baseline and correlation assessment since patient was on a DOAC. CBC okay - hgb 8.9, plts 350  PM: aPTT 58 (subtherapeutic), HL 0.41 - not correlating. On heparin 1400 units/hr - no issues with the infusion reported. No new bleeding issues documented.  Goal of Therapy:  Heparin level 0.3-0.7 units/ml Monitor platelets by anticoagulation protocol: Yes   Plan:  Increase heparin infusion to 1500 units/hr Check aPTT in 6 hours and daily while on heparin Check aPTT and heparin level daily until correlating Stop heparin drip at 0900 for ERCP at 13:00 on 12/26 Continue to monitor H&H and platelets F/u resuming Xarelto as appropriate  Loralee Pacas, PharmD, BCPS 07/11/2023 6:05 PM   Please check AMION for  all [facility] Pharmacy phone numbers After 10:00 PM, call Main Pharmacy [phone]

## 2023-07-11 NOTE — Progress Notes (Addendum)
PROGRESS NOTE                                                                                                                                                                                                             Patient Demographics:    Leroy Chen, is a 67 y.o. male, DOB - 05-Dec-1955, YNW:[id number]  Outpatient Primary MD for the patient is [name]    LOS - 8  Admit date - 07/02/2023    Chief Complaint  Patient presents with   Leg Pain       Brief Narrative (HPI from H&P)    67 y.o. male with medical history significant of paroxysmal A-fib status post DCCV in 2021 on Xarelto, history of first and second-degree AV block not requiring pacemaker placement, atrial tachycardia, insulin-dependent type 2 diabetes, CKD stage IIIa, hypertension, hyperlipidemia, former smoker, obesity, hidradenitis suppurativa, depression.  Patient was recently seen in the ED on 06/27/23 for fall and right leg injury.  X-ray of the femur showed no evidence of fracture.  His pain was felt to be related to right quadriceps tendon injury.  He was made nonweightbearing on crutches, knee immobilizer placed, and was discharged with plan for orthopedic follow-up.   Continue to have swelling of his right leg with right thigh pain and discomfort came to the ER where CT scan showed very large amount of fluid collection in his right lower extremity in the thigh area, he was admitted for further care.  Further workup suggest that he might have a very large hematoma in his right thigh due to being on Xarelto and having a soft tissue injury.   Subjective:   Patient in bed, appears comfortable, denies any headache, no fever, no chest pain or pressure, no shortness of breath , minimal right upper quadrant abdominal pain. No focal weakness.   Assessment  & Plan :    Right lower extremity soft tissue injury with fall 1 week ago with right lower  extremity swelling and possible secondary cellulitis and foreign body did on CT scan.  He was diagnosed with severe sepsis at the time of admission as well.  I think patient's main issue is right thigh soft tissue hematoma which he incurred due to his severe fall 1 week prior to admission in the setting of him being on Xarelto, CT scan  has been noted.  He is already been placed on antibiotics for possible secondary cellulitis and sepsis this has been clinically ruled out.  Orthopedics was consulted, MRI of the right leg obtained with large quadriceps tear and patellar injury and soft tissue hematoma, case discussed with Dr. Lajoyce Corners on 07/05/2023, stopped antibiotics as this is not sepsis or infection, he underwent surgical correction on 07/07/2023 by Dr. Lajoyce Corners of quadriceps tendon repair.  Tolerated the procedure well.  Per Ortho. Patient may be weightbearing as tolerated with the knee immobilizer. I will follow-up with him in 2 weeks in the office. He can discharge to home as soon as he is safe with therapy.  Initiate PT OT, prophylactic dose heparin on 07/07/2023 if tolerates well than home dose Xarelto on 07/08/2023 and discharge home with home PT OT as soon as his strength has improved.   Acute right upper quadrant abdominal pain starting night of 07/09/2023.  LFTs and total bilirubin are high, question if he has a CBD stone or has passed a CBD stone, still has his gallbladder, CT abdomen pelvis noted with possible cholecystitis and ?? liver abscess, HIDA scan ordered.  His symptoms have remarkably improved on 07/11/2023 however LFTs and total bilirubin still rising, his case was discussed with both GI and [facility] in detail on 07/11/2023, both are following.  Continue empiric antibiotics.  Will defer management of this issue to GI and general surgery.  Will stop his Xarelto for now and switch him to heparin drip just in case he requires a procedure.  Sinus tachycardia - Due to #1 above supportive care and  pain control. Improved.   Normocytic anemia - Hemoglobin currently 11.1, was 16.5 in July 2024.  Patient is chronically anticoagulated although denies any symptoms of GI bleed.  Acute anemia is clearly due to soft tissue hematoma and blood loss in the right lower extremity.  Monitor.  Type screen also done.  No signs of GI bleed continue PPI.  Continue to monitor.  Will stop his Xarelto and place him on heparin for now.   AKI on CKD stage IIIa - Likely prerenal in etiology in the setting of blood loss, will hydrate with IV fluids and monitor H&H.  Paroxysmal A-fib status post DCCV in 2021 - History of atrial tachycardia: Rate is controlled  - Continue  metoprolol, and flecainide.  Xarelto/heparin drip.  Hypertension  Blood pressure only on the soft side continue to hold ARB, reduce beta-blocker and hydrate on 07/08/2023.   Hyperlipidemia hold Crestor until his LFTs stabilize.   Depression  Continue Zoloft.   GERD  Continue Protonix.  Mild hyponatremia  Gentle hydration   High anion gap metabolic acidosis  - he takes Jardiance and had developed euglycemic DKA which has resolved after insulin drip, transition to subcu insulin and sliding scale, discontinue Jardiance permanently upon discharge.    Weakness and deconditioning.  PT OT, ambulate in the hallway, sit in chair in the daytime, prepare for home discharge on 07/10/2023 if he does well with PT.  He still feels extremely weak overall.    Insulin-dependent type 2 diabetes -on long-acting insulin twice a day along with sliding scale, dose adjusted while he is n.p.o. on 07/10/2023.   Lab Results  Component Value Date   HGBA1C 7.6 (H) 07/03/2023   CBG (last 3)  Recent Labs    07/10/23 2356 07/11/23 0331 07/11/23 0901  GLUCAP 160* 143* 155*         Condition - Fair  Family Communication  :  Wife [phone]  on 07/04/2023 at 7:53 AM, 07/08/2023, 07/11/2023  Code Status :  Full  Consults  :  Ortho, GI, general  surgery  PUD Prophylaxis : PPI   Procedures  :     HIDA scan.    MRCP.  1. Small volume of layering sludge in the gallbladder. Severe gallbladder wall thickening and trace pericholecystic fluid. Findings are consistent with acute cholecystitis. 2. Heterogeneously rim enhancing lesion in the posterior right lobe of the liver measuring 2.2 x 1.4 cm, with adjacent small heterogeneously enhancing areas of T2 hyperintensity posteriorly. Findings are most consistent with hepatic abscess in this clinical setting. Malignancy remains a differential consideration and at minimum, recommend follow-up imaging in 3 months following acute clinical presentation to ensure complete resolution. 3. Central common bile duct is dilated up to 1.0 cm in the pancreatic head, however the duct tapers smoothly to the ampulla without calculus or other evidence of obstruction. Aortic Atherosclerosis (ICD10-I70.0)  CT Abd - Pelvis - 1. Small volume of layering sludge in the gallbladder. Severe gallbladder wall thickening and trace pericholecystic fluid. Findings are consistent with acute cholecystitis. 2. Heterogeneously rim enhancing lesion in the posterior right lobe of the liver measuring 2.2 x 1.4 cm, with adjacent small heterogeneously enhancing areas of T2 hyperintensity posteriorly. Findings are most consistent with hepatic abscess in this clinical setting. Malignancy remains a differential consideration and at minimum, recommend follow-up imaging in 3 months following acute clinical presentation to ensure complete resolution. 3. Central common bile duct is dilated up to 1.0 cm in the pancreatic head, however the duct tapers smoothly to the ampulla without calculus or other evidence of obstruction. Aortic Atherosclerosis (ICD10-I70.0).   MRI.  Right lower extremity.    1. High-grade partial tear of the distal quadriceps tendon, involving the superficial central fibers arising from the rectus femoris tendon as well as some of  the medial fibers arising from the vastus medialis. The deep central and lateral fibers appear intact. 2. Large amount of heterogeneous soft tissue swelling anteriorly in the distal thigh, likely representing soft tissue hematoma. 3. Possible partial tear of the medial patellofemoral ligament at its patellar attachment. 4. The menisci, cruciate and collateral ligaments appear intact. 5. No evidence of acute fracture or osteonecrosis  Lower extremity venous duplex.  No DVT.    CT right hip.  No fracture.    CT scan tibia-fibula right leg.   1. Subcutaneous edema throughout the mid and lower thigh surrounding the knee. No evidence for soft tissue gas or fluid collection. 2. No acute osseous abnormality. 3. Thin linear radiopaque foreign body measuring 1 cm within the lateral subcutaneous tissues of the distal right lower extremity.      Disposition Plan  :    Status is: Inpatient   DVT Prophylaxis  :    SCDs Start: 07/06/23 1801 SCDs Start: 07/04/23 0813 Place and maintain sequential compression device Start: 07/04/23 0718   Lab Results  Component Value Date   PLT 350 07/11/2023    Diet :  Diet Order             Diet NPO time specified  Diet effective midnight                    Inpatient Medications  Scheduled Meds:  docusate sodium  100 mg Oral BID   flecainide  50 mg Oral BID   insulin aspart  0-15 Units Subcutaneous Q6H   insulin glargine-yfgn  15 Units Subcutaneous BID  metoprolol tartrate  25 mg Oral BID   pantoprazole  40 mg Oral Daily   potassium chloride  20 mEq Oral Once   sertraline  100 mg Oral Daily   Continuous Infusions:  cefTRIAXone (ROCEPHIN)  IV Stopped (07/10/23 1345)   lactated ringers 100 mL/hr at 07/11/23 0806   metronidazole Stopped (07/11/23 0120)     PRN Meds:.acetaminophen, albuterol, bisacodyl, dicyclomine, methocarbamol **OR** [DISCONTINUED] methocarbamol (ROBAXIN) injection, metoCLOPramide **OR** [DISCONTINUED] metoCLOPramide  (REGLAN) injection, naLOXone (NARCAN)  injection, [DISCONTINUED] ondansetron **OR** ondansetron (ZOFRAN) IV, oxyCODONE, polyethylene glycol    Objective:   Vitals:   07/10/23 1236 07/10/23 2022 07/11/23 0018 07/11/23 0325  BP: (!) 166/61 (!) 151/62 132/65 (!) 155/62  Pulse: (!) 109 97 95 95  Resp: 18 18 18 18   Temp: 98.8 F (37.1 C) 98.3 F (36.8 C) 98.8 F (37.1 C) 99.1 F (37.3 C)  TempSrc: Oral Oral Oral Oral  SpO2: 93% 97% 93% 95%  Weight:      Height:        Wt Readings from Last 3 Encounters:  07/02/23 113.4 kg  06/27/23 113.4 kg  02/12/23 113 kg     Intake/Output Summary (Last 24 hours) at 07/11/2023 0910 Last data filed at 07/11/2023 0906 Gross per 24 hour  Intake 408.51 ml  Output 1325 ml  Net -916.49 ml     Physical Exam  Awake Alert, No new F.N deficits, Normal affect [place].AT,PERRAL Supple Neck, No JVD,   Symmetrical Chest wall movement, Good air movement bilaterally, CTAB RRR,No Gallops, Rubs or new Murmurs,  +ve B.Sounds, Abd Soft, minimal right upper quadrant tenderness Right knee and leg in knee immobilizer   Data Review:    Recent Labs  Lab 07/07/23 0500 07/08/23 0439 07/09/23 0444 07/10/23 0757 07/11/23 0504  WBC 8.1 8.5 7.1 7.9 8.3  HGB 8.5* 8.3* 8.0* 9.2* 8.9*  HCT 26.7* 26.8* 25.2* 28.7* 28.6*  PLT 262 262 311 346 350  MCV 97.8 98.9 96.6 94.7 95.7  MCH 31.1 30.6 30.7 30.4 29.8  MCHC 31.8 31.0 31.7 32.1 31.1  RDW 15.5 15.2 15.1 15.2 15.8*  LYMPHSABS 0.9 1.1 1.1 0.4* 1.0  MONOABS 1.2* 1.6* 1.4* 1.1* 1.3*  EOSABS 0.1 0.1 0.1 0.0 0.1  BASOSABS 0.0 0.0 0.0 0.0 0.1    Recent Labs  Lab 07/05/23 0459 07/06/23 0237 07/07/23 0500 07/08/23 0439 07/09/23 0444 07/10/23 0757 07/10/23 1014 07/10/23 1504 07/11/23 0504  NA 137 137 138 138 138 134*  --   --  139  K 4.4 3.6 4.7 4.8 3.6 4.1  --   --  3.8  CL 110 109 105 106 106 102  --   --  104  CO2 20* 20* 22 25 25  18*  --   --  21*  ANIONGAP 7 8 11 7 7 14   --   --  14  GLUCOSE  118* 132* 157* 162* 142* 239*  --   --  178*  BUN 17 19 18 19 17 15   --   --  14  CREATININE 1.22 1.47* 1.21 1.84* 1.48* 1.49*  --   --  1.65*  AST  --   --   --   --   --  407*  --   --  1,023*  ALT  --   --   --   --   --  212*  --   --  565*  ALKPHOS  --   --   --   --   --  271*  --   --  376*  BILITOT  --   --   --   --   --  13.2*  --  14.9* 18.3*  ALBUMIN  --   --   --   --   --  2.7*  --   --  2.4*  CRP 9.5* 12.9* 17.7* 24.6*  --   --  18.5*  --  17.8*  PROCALCITON 0.11 0.13 0.14 0.47 0.35  --  0.50  --  0.70  INR  --   --   --   --   --   --   --  2.2*  --   BNP 348.2* 98.5 74.8 90.7  --   --   --   --   --   MG 2.3 2.3 2.2 2.2  --   --   --   --  2.3  CALCIUM 7.9* 8.1* 8.6* 8.6* 8.4* 8.6*  --   --  8.9      Recent Labs  Lab 07/05/23 0459 07/06/23 0237 07/07/23 0500 07/08/23 0439 07/09/23 0444 07/10/23 0757 07/10/23 1014 07/10/23 1504 07/11/23 0504  CRP 9.5* 12.9* 17.7* 24.6*  --   --  18.5*  --  17.8*  PROCALCITON 0.11 0.13 0.14 0.47 0.35  --  0.50  --  0.70  INR  --   --   --   --   --   --   --  2.2*  --   BNP 348.2* 98.5 74.8 90.7  --   --   --   --   --   MG 2.3 2.3 2.2 2.2  --   --   --   --  2.3  CALCIUM 7.9* 8.1* 8.6* 8.6* 8.4* 8.6*  --   --  8.9    --------------------------------------------------------------------------------------------------------------- No results found for: "CHOL", "HDL", "LDLCALC", "LDLDIRECT", "TRIG", "CHOLHDL"  Lab Results  Component Value Date   HGBA1C 7.6 (H) 07/03/2023     Micro Results Recent Results (from the past 240 hours)  Resp panel by RT-PCR (RSV, Flu A&B, Covid) Anterior Nasal Swab     Status: None   Collection Time: 07/02/23  4:59 PM   Specimen: Anterior Nasal Swab  Result Value Ref Range Status   SARS Coronavirus 2 by RT PCR NEGATIVE NEGATIVE Final   Influenza A by PCR NEGATIVE NEGATIVE Final   Influenza B by PCR NEGATIVE NEGATIVE Final    Comment: (NOTE) The Xpert Xpress SARS-CoV-2/FLU/RSV plus assay is  intended as an aid in the diagnosis of influenza from Nasopharyngeal swab specimens and should not be used as a sole basis for treatment. Nasal washings and aspirates are unacceptable for Xpert Xpress SARS-CoV-2/FLU/RSV testing.  Fact Sheet for Patients: BloggerCourse.com  Fact Sheet for Healthcare Providers: SeriousBroker.it  This test is not yet approved or cleared by the [place] FDA and has been authorized for detection and/or diagnosis of SARS-CoV-2 by FDA under an Emergency Use Authorization (EUA). This EUA will remain in effect (meaning this test can be used) for the duration of the COVID-19 declaration under Section 564(b)(1) of the Act, 21 U.S.C. section 360bbb-3(b)(1), unless the authorization is terminated or revoked.     Resp Syncytial Virus by PCR NEGATIVE NEGATIVE Final    Comment: (NOTE) Fact Sheet for Patients: BloggerCourse.com  Fact Sheet for Healthcare Providers: SeriousBroker.it  This test is not yet approved or cleared by the [place] FDA and has been authorized for detection and/or diagnosis of SARS-CoV-2  by FDA under an Emergency Use Authorization (EUA). This EUA will remain in effect (meaning this test can be used) for the duration of the COVID-19 declaration under Section 564(b)(1) of the Act, 21 U.S.C. section 360bbb-3(b)(1), unless the authorization is terminated or revoked.  Performed at [facility], [address] [place], [place] [address]   Blood culture (routine x 2)     Status: None   Collection Time: 07/02/23  8:24 PM   Specimen: BLOOD RIGHT HAND  Result Value Ref Range Status   Specimen Description BLOOD RIGHT HAND  Final   Special Requests   Final    BOTTLES DRAWN AEROBIC ONLY Blood Culture results may not be optimal due to an inadequate volume of blood received in culture bottles   Culture   Final    NO GROWTH 5  DAYS Performed at [facility], [address] [place], [place] [address]    Report Status 07/07/2023 FINAL  Final  Blood culture (routine x 2)     Status: None   Collection Time: 07/02/23  8:35 PM   Specimen: BLOOD  Result Value Ref Range Status   Specimen Description BLOOD SITE NOT SPECIFIED  Final   Special Requests   Final    BOTTLES DRAWN AEROBIC ONLY Blood Culture results may not be optimal due to an inadequate volume of blood received in culture bottles   Culture   Final    NO GROWTH 5 DAYS Performed at [facility], [address] [place], [place] [address]    Report Status 07/07/2023 FINAL  Final  MRSA Next Gen by PCR, Nasal     Status: None   Collection Time: 07/04/23 11:24 AM   Specimen: Nasal Mucosa; Nasal Swab  Result Value Ref Range Status   MRSA by PCR Next Gen NOT DETECTED NOT DETECTED Final    Comment: (NOTE) The GeneXpert MRSA Assay (FDA approved for NASAL specimens only), is one component of a comprehensive MRSA colonization surveillance program. It is not intended to diagnose MRSA infection nor to guide or monitor treatment for MRSA infections. Test performance is not FDA approved in patients less than 43 years old. Performed at [facility], [address], [place], [place] [address]   Surgical PCR screen     Status: None   Collection Time: 07/06/23  8:23 AM   Specimen: Nasal Mucosa; Nasal Swab  Result Value Ref Range Status   MRSA, PCR NEGATIVE NEGATIVE Final   Staphylococcus aureus NEGATIVE NEGATIVE Final    Comment: (NOTE) The Xpert SA Assay (FDA approved for NASAL specimens in patients 78 years of age and older), is one component of a comprehensive surveillance program. It is not intended to diagnose infection nor to guide or monitor treatment. Performed at [facility], [address] [place], [place] [address]     Radiology Reports MR 3D Recon At Scanner Result Date: 07/10/2023 : CLINICAL DATA: Jaundice, right  upper quadrant abdominal pain, gallstones, indeterminate liver lesion EXAM: MRI ABDOMEN WITHOUT AND WITH CONTRAST (INCLUDING MRCP) TECHNIQUE: Multiplanar multisequence MR imaging of the abdomen was performed both before and after the administration of intravenous contrast. Heavily T2-weighted images of the biliary and pancreatic ducts were obtained, and three-dimensional MRCP images were rendered by post processing. CONTRAST: 10mL GADAVIST GADOBUTROL 1 MMOL/ML IV SOLN COMPARISON: Same-day CT abdomen pelvis CT abdomen pelvis, 11/10/2020 FINDINGS: Lower chest: No acute abnormality. Hepatobiliary: Heterogeneously rim enhancing lesion in the posterior right lobe of  the liver measuring 2.2 x 1.4 cm (series 1401, image 77), with adjacent small heterogeneously enhancing areas of T2 hyperintensity posteriorly. Small volume of layering sludge in the gallbladder (series 8, image 20). Severe gallbladder wall thickening and trace pericholecystic fluid. Central common bile duct is dilated up to 1.0 cm in the pancreatic head, however the duct tapers smoothly to the ampulla without calculus or other evidence of obstruction (series 2, image 18). Pancreas: Unremarkable. No pancreatic ductal dilatation or surrounding inflammatory changes. Spleen: Normal in size without significant abnormality. Adrenals/Urinary Tract: Adrenal glands are unremarkable. Kidneys are normal, without renal calculi, solid lesion, or hydronephrosis. Stomach/Bowel: Stomach is within normal limits. No evidence of bowel wall thickening, distention, or inflammatory changes. Vascular/Lymphatic: Aortic atherosclerosis. No enlarged abdominal lymph nodes. Other: No abdominal wall hernia or abnormality. No ascites. Musculoskeletal: No acute or significant osseous findings. IMPRESSION: 1. Small volume of layering sludge in the gallbladder. Severe gallbladder wall thickening and trace pericholecystic fluid. Findings are consistent with acute cholecystitis. 2.  Heterogeneously rim enhancing lesion in the posterior right lobe of the liver measuring 2.2 x 1.4 cm, with adjacent small heterogeneously enhancing areas of T2 hyperintensity posteriorly. Findings are most consistent with hepatic abscess in this clinical setting. Malignancy remains a differential consideration and at minimum, recommend follow-up imaging in 3 months following acute clinical presentation to ensure complete resolution. 3. Central common bile duct is dilated up to 1.0 cm in the pancreatic head, however the duct tapers smoothly to the ampulla without calculus or other evidence of obstruction. Aortic Atherosclerosis (ICD10-I70.0). Electronically Signed   By: Jearld Lesch M.D.   On: 07/10/2023 19:46   MR ABDOMEN MRCP W WO CONTAST Result Date: 07/10/2023 CLINICAL DATA:  Jaundice, right upper quadrant abdominal pain, gallstones, indeterminate liver lesion EXAM: MRI ABDOMEN WITHOUT AND WITH CONTRAST (INCLUDING MRCP) TECHNIQUE: Multiplanar multisequence MR imaging of the abdomen was performed both before and after the administration of intravenous contrast. Heavily T2-weighted images of the biliary and pancreatic ducts were obtained, and three-dimensional MRCP images were rendered by post processing. CONTRAST:  10mL GADAVIST GADOBUTROL 1 MMOL/ML IV SOLN COMPARISON:  Same-day CT abdomen pelvis CT abdomen pelvis, 11/10/2020 FINDINGS: Lower chest: No acute abnormality. Hepatobiliary: Heterogeneously rim enhancing lesion in the posterior right lobe of the liver measuring 2.2 x 1.4 cm (series 1401, image 77), with adjacent small heterogeneously enhancing areas of T2 hyperintensity posteriorly. Small volume of layering sludge in the gallbladder (series 8, image 20). Severe gallbladder wall thickening and trace pericholecystic fluid. Central common bile duct is dilated up to 1.0 cm in the pancreatic head, however the duct tapers smoothly to the ampulla without calculus or other evidence of obstruction (series 2,  image 18). Pancreas: Unremarkable. No pancreatic ductal dilatation or surrounding inflammatory changes. Spleen: Normal in size without significant abnormality. Adrenals/Urinary Tract: Adrenal glands are unremarkable. Kidneys are normal, without renal calculi, solid lesion, or hydronephrosis. Stomach/Bowel: Stomach is within normal limits. No evidence of bowel wall thickening, distention, or inflammatory changes. Vascular/Lymphatic: Aortic atherosclerosis. No enlarged abdominal lymph nodes. Other: No abdominal wall hernia or abnormality. No ascites. Musculoskeletal: No acute or significant osseous findings. IMPRESSION: 1. Small volume of layering sludge in the gallbladder. Severe gallbladder wall thickening and trace pericholecystic fluid. Findings are consistent with acute cholecystitis. 2. Heterogeneously rim enhancing lesion in the posterior right lobe of the liver measuring 2.2 x 1.4 cm, with adjacent small heterogeneously enhancing areas of T2 hyperintensity posteriorly. Findings are most consistent with hepatic abscess in this clinical setting. Malignancy  remains a differential consideration and at minimum, recommend follow-up imaging in 3 months following acute clinical presentation to ensure complete resolution. 3. Central common bile duct is dilated up to 1.0 cm in the pancreatic head, however the duct tapers smoothly to the ampulla without calculus or other evidence of obstruction. Aortic Atherosclerosis (ICD10-I70.0). Electronically Signed   By: Jearld Lesch M.D.   On: 07/10/2023 19:42   DG Abd Portable 1V Result Date: 07/10/2023 CLINICAL DATA:  Upper abdominal pain. EXAM: PORTABLE ABDOMEN - 1 VIEW COMPARISON:  04/23/2019. FINDINGS: The bowel gas pattern is normal. No radio-opaque calculi or other significant radiographic abnormality are seen. IMPRESSION: Negative. Electronically Signed   By: Layla Maw M.D.   On: 07/10/2023 09:43   CT ABDOMEN PELVIS W CONTRAST Result Date:  07/10/2023 CLINICAL DATA:  Right upper quadrant pain. EXAM: CT ABDOMEN AND PELVIS WITH CONTRAST TECHNIQUE: Multidetector CT imaging of the abdomen and pelvis was performed using the standard protocol following bolus administration of intravenous contrast. RADIATION DOSE REDUCTION: This exam was performed according to the departmental dose-optimization program which includes automated exposure control, adjustment of the mA and/or kV according to patient size and/or use of iterative reconstruction technique. CONTRAST:  75mL OMNIPAQUE IOHEXOL 350 MG/ML SOLN COMPARISON:  11/10/2020 and 10/12/2014. FINDINGS: Lower chest: Linear bibasilar subsegmental atelectasis no or scarring. No pleural or pericardial effusion. Coronary atheromatous calcifications. Hepatobiliary: Hepatic lesion right lobe measuring 2.8 cm is indeterminate and new compared to old studies. This lesion can be assessed further with liver protocol CT or MRI. No intrahepatic ductal dilatation. Gallbladder is distended with pericholecystic inflammatory changes and numerous small calcified stones. The appearance is suggestive of acute cholecystitis. This can be confirmed with a HIDA scan, if indicated. Pancreas: Unremarkable. No pancreatic ductal dilatation or surrounding inflammatory changes. Spleen: Normal in size without focal abnormality. Adrenals/Urinary Tract: Right adrenal nodule measuring 2 cm is unchanged. No hydronephrosis or nephrolithiasis. Unremarkable urinary bladder. Stomach/Bowel: No bowel dilatation to suggest obstruction. No inflammatory mucosal process of the bowel to limits of CT evaluation. Diverticula in the descending and sigmoid colon. No evidence of diverticulitis. Vascular/Lymphatic: No significant vascular findings are present. No enlarged abdominal or pelvic lymph nodes. Reproductive: Prominent prostate measures 6.3 x 5.7 x 4.7 cm. Left-sided reservoir for penile prosthesis noted. Other: Small periumbilical abdominal wall hernia  containing omental fat without evidence of incarceration or herniated bowel. No abdominopelvic ascites. Musculoskeletal: Degenerative changes with vacuum discs at L1-2-3. IMPRESSION: 1. Findings suggestive of acute cholecystitis. 2. Indeterminate hepatic lesion. This can be assessed further with liver protocol CT or MRI. 3. Stable right adrenal nodule. 4. Diverticulosis without diverticulitis. 5. Prostatomegaly. Electronically Signed   By: Layla Maw M.D.   On: 07/10/2023 09:42       Signature  -   Susa Raring M.D on 07/11/2023 at 9:10 AM   -  To page go to [email or web address]

## 2023-07-11 NOTE — Progress Notes (Signed)
GASTROENTEROLOGY ROUNDING NOTE   Subjective: Patient feeling well today.  He denies any abdominal pain, nausea or vomiting.  He reports ongoing poor appetite, which has been the case for a couple of weeks now.  No itching.  No mental status changes.  He is disappointed he is not going home, but understands the concern for his liver.  Liver enzymes increased significantly from yesterday, AST of thousand from 400, ALT 500 up from 200 and T. bili up to 18. INR 2.2, but patient was on Eliquis MRCP showed layering sludge in gallbladder with severe gallbladder wall thickening and pericholecystic fluid, consistent with acute cholecystitis.  Additionally, there is a rim-enhancing lesion in the right posterior lobe suspicious for hepatic abscess versus malignancy.  Common bile duct was dilated up to a centimeter, but tapered normally at the level of the ampulla.  No stone seen.   Objective: Vital signs in last 24 hours: Temp:  [98.3 F (36.8 C)-99.1 F (37.3 C)] 99.1 F (37.3 C) (12/25 0325) Pulse Rate:  [95-97] 95 (12/25 0325) Resp:  [18] 18 (12/25 0325) BP: (132-155)/(62-65) 155/62 (12/25 0325) SpO2:  [93 %-97 %] 95 % (12/25 0325) Last BM Date : 07/03/23 General: NAD, pleasant African-American male Lungs:  CTA b/l, no w/r/r Heart:  RRR, no m/r/g Abdomen:  Soft, mild focal tenderness to deep palpation in the right upper quadrant, negative Murphy's, ND, +BS, large midline laparotomy scar Ext: Right leg in brace, not examined    Intake/Output from previous day: 12/24 0701 - 12/25 0700 In: 308.5 [I.V.:108.5; IV Piggyback:200] Out: 2025 [Urine:2025] Intake/Output this shift: Total I/O In: 100 [IV Piggyback:100] Out: -    Lab Results: Recent Labs    07/09/23 0444 07/10/23 0757 07/11/23 0504  WBC 7.1 7.9 8.3  HGB 8.0* 9.2* 8.9*  PLT 311 346 350  MCV 96.6 94.7 95.7   BMET Recent Labs    07/09/23 0444 07/10/23 0757 07/11/23 0504  NA 138 134* 139  K 3.6 4.1 3.8  CL  106 102 104  CO2 25 18* 21*  GLUCOSE 142* 239* 178*  BUN 17 15 14   CREATININE 1.48* 1.49* 1.65*  CALCIUM 8.4* 8.6* 8.9   LFT Recent Labs    07/10/23 0757 07/10/23 1504 07/11/23 0504  PROT 7.1  --  6.9  ALBUMIN 2.7*  --  2.4*  AST 407*  --  1,023*  ALT 212*  --  565*  ALKPHOS 271*  --  376*  BILITOT 13.2* 14.9* 18.3*  BILIDIR  --  9.2*  --   IBILI  --  5.7*  --    PT/INR Recent Labs    07/10/23 1504  INR 2.2*      Imaging/Other results: MR 3D Recon At Scanner Result Date: 07/10/2023 : CLINICAL DATA: Jaundice, right upper quadrant abdominal pain, gallstones, indeterminate liver lesion EXAM: MRI ABDOMEN WITHOUT AND WITH CONTRAST (INCLUDING MRCP) TECHNIQUE: Multiplanar multisequence MR imaging of the abdomen was performed both before and after the administration of intravenous contrast. Heavily T2-weighted images of the biliary and pancreatic ducts were obtained, and three-dimensional MRCP images were rendered by post processing. CONTRAST: 10mL GADAVIST GADOBUTROL 1 MMOL/ML IV SOLN COMPARISON: Same-day CT abdomen pelvis CT abdomen pelvis, 11/10/2020 FINDINGS: Lower chest: No acute abnormality. Hepatobiliary: Heterogeneously rim enhancing lesion in the posterior right lobe of the liver measuring 2.2 x 1.4 cm (series 1401, image 77), with adjacent small heterogeneously enhancing areas of T2 hyperintensity posteriorly. Small volume of layering sludge in the gallbladder (series 8, image  20). Severe gallbladder wall thickening and trace pericholecystic fluid. Central common bile duct is dilated up to 1.0 cm in the pancreatic head, however the duct tapers smoothly to the ampulla without calculus or other evidence of obstruction (series 2, image 18). Pancreas: Unremarkable. No pancreatic ductal dilatation or surrounding inflammatory changes. Spleen: Normal in size without significant abnormality. Adrenals/Urinary Tract: Adrenal glands are unremarkable. Kidneys are normal, without renal  calculi, solid lesion, or hydronephrosis. Stomach/Bowel: Stomach is within normal limits. No evidence of bowel wall thickening, distention, or inflammatory changes. Vascular/Lymphatic: Aortic atherosclerosis. No enlarged abdominal lymph nodes. Other: No abdominal wall hernia or abnormality. No ascites. Musculoskeletal: No acute or significant osseous findings. IMPRESSION: 1. Small volume of layering sludge in the gallbladder. Severe gallbladder wall thickening and trace pericholecystic fluid. Findings are consistent with acute cholecystitis. 2. Heterogeneously rim enhancing lesion in the posterior right lobe of the liver measuring 2.2 x 1.4 cm, with adjacent small heterogeneously enhancing areas of T2 hyperintensity posteriorly. Findings are most consistent with hepatic abscess in this clinical setting. Malignancy remains a differential consideration and at minimum, recommend follow-up imaging in 3 months following acute clinical presentation to ensure complete resolution. 3. Central common bile duct is dilated up to 1.0 cm in the pancreatic head, however the duct tapers smoothly to the ampulla without calculus or other evidence of obstruction. Aortic Atherosclerosis (ICD10-I70.0). Electronically Signed   By: Jearld Lesch M.D.   On: 07/10/2023 19:46   MR ABDOMEN MRCP W WO CONTAST Result Date: 07/10/2023 CLINICAL DATA:  Jaundice, right upper quadrant abdominal pain, gallstones, indeterminate liver lesion EXAM: MRI ABDOMEN WITHOUT AND WITH CONTRAST (INCLUDING MRCP) TECHNIQUE: Multiplanar multisequence MR imaging of the abdomen was performed both before and after the administration of intravenous contrast. Heavily T2-weighted images of the biliary and pancreatic ducts were obtained, and three-dimensional MRCP images were rendered by post processing. CONTRAST:  10mL GADAVIST GADOBUTROL 1 MMOL/ML IV SOLN COMPARISON:  Same-day CT abdomen pelvis CT abdomen pelvis, 11/10/2020 FINDINGS: Lower chest: No acute  abnormality. Hepatobiliary: Heterogeneously rim enhancing lesion in the posterior right lobe of the liver measuring 2.2 x 1.4 cm (series 1401, image 77), with adjacent small heterogeneously enhancing areas of T2 hyperintensity posteriorly. Small volume of layering sludge in the gallbladder (series 8, image 20). Severe gallbladder wall thickening and trace pericholecystic fluid. Central common bile duct is dilated up to 1.0 cm in the pancreatic head, however the duct tapers smoothly to the ampulla without calculus or other evidence of obstruction (series 2, image 18). Pancreas: Unremarkable. No pancreatic ductal dilatation or surrounding inflammatory changes. Spleen: Normal in size without significant abnormality. Adrenals/Urinary Tract: Adrenal glands are unremarkable. Kidneys are normal, without renal calculi, solid lesion, or hydronephrosis. Stomach/Bowel: Stomach is within normal limits. No evidence of bowel wall thickening, distention, or inflammatory changes. Vascular/Lymphatic: Aortic atherosclerosis. No enlarged abdominal lymph nodes. Other: No abdominal wall hernia or abnormality. No ascites. Musculoskeletal: No acute or significant osseous findings. IMPRESSION: 1. Small volume of layering sludge in the gallbladder. Severe gallbladder wall thickening and trace pericholecystic fluid. Findings are consistent with acute cholecystitis. 2. Heterogeneously rim enhancing lesion in the posterior right lobe of the liver measuring 2.2 x 1.4 cm, with adjacent small heterogeneously enhancing areas of T2 hyperintensity posteriorly. Findings are most consistent with hepatic abscess in this clinical setting. Malignancy remains a differential consideration and at minimum, recommend follow-up imaging in 3 months following acute clinical presentation to ensure complete resolution. 3. Central common bile duct is dilated up to 1.0 cm  in the pancreatic head, however the duct tapers smoothly to the ampulla without calculus or  other evidence of obstruction. Aortic Atherosclerosis (ICD10-I70.0). Electronically Signed   By: Jearld Lesch M.D.   On: 07/10/2023 19:42   DG Abd Portable 1V Result Date: 07/10/2023 CLINICAL DATA:  Upper abdominal pain. EXAM: PORTABLE ABDOMEN - 1 VIEW COMPARISON:  04/23/2019. FINDINGS: The bowel gas pattern is normal. No radio-opaque calculi or other significant radiographic abnormality are seen. IMPRESSION: Negative. Electronically Signed   By: Layla Maw M.D.   On: 07/10/2023 09:43   CT ABDOMEN PELVIS W CONTRAST Result Date: 07/10/2023 CLINICAL DATA:  Right upper quadrant pain. EXAM: CT ABDOMEN AND PELVIS WITH CONTRAST TECHNIQUE: Multidetector CT imaging of the abdomen and pelvis was performed using the standard protocol following bolus administration of intravenous contrast. RADIATION DOSE REDUCTION: This exam was performed according to the departmental dose-optimization program which includes automated exposure control, adjustment of the mA and/or kV according to patient size and/or use of iterative reconstruction technique. CONTRAST:  75mL OMNIPAQUE IOHEXOL 350 MG/ML SOLN COMPARISON:  11/10/2020 and 10/12/2014. FINDINGS: Lower chest: Linear bibasilar subsegmental atelectasis no or scarring. No pleural or pericardial effusion. Coronary atheromatous calcifications. Hepatobiliary: Hepatic lesion right lobe measuring 2.8 cm is indeterminate and new compared to old studies. This lesion can be assessed further with liver protocol CT or MRI. No intrahepatic ductal dilatation. Gallbladder is distended with pericholecystic inflammatory changes and numerous small calcified stones. The appearance is suggestive of acute cholecystitis. This can be confirmed with a HIDA scan, if indicated. Pancreas: Unremarkable. No pancreatic ductal dilatation or surrounding inflammatory changes. Spleen: Normal in size without focal abnormality. Adrenals/Urinary Tract: Right adrenal nodule measuring 2 cm is unchanged. No  hydronephrosis or nephrolithiasis. Unremarkable urinary bladder. Stomach/Bowel: No bowel dilatation to suggest obstruction. No inflammatory mucosal process of the bowel to limits of CT evaluation. Diverticula in the descending and sigmoid colon. No evidence of diverticulitis. Vascular/Lymphatic: No significant vascular findings are present. No enlarged abdominal or pelvic lymph nodes. Reproductive: Prominent prostate measures 6.3 x 5.7 x 4.7 cm. Left-sided reservoir for penile prosthesis noted. Other: Small periumbilical abdominal wall hernia containing omental fat without evidence of incarceration or herniated bowel. No abdominopelvic ascites. Musculoskeletal: Degenerative changes with vacuum discs at L1-2-3. IMPRESSION: 1. Findings suggestive of acute cholecystitis. 2. Indeterminate hepatic lesion. This can be assessed further with liver protocol CT or MRI. 3. Stable right adrenal nodule. 4. Diverticulosis without diverticulitis. 5. Prostatomegaly. Electronically Signed   By: Layla Maw M.D.   On: 07/10/2023 09:42      Assessment and Plan:  67 year old male with history of atrial fibrillation, diabetes, CKD initially admitted with right leg injury, subsequently found to have quadriceps rupture now repaired, who developed intense abdominal pain which lasted only a few hours on the evening of December 23.  Subsequent liver enzymes were markedly elevated (T. bili 13, alk phos 270, AST 47, ALT 412).  MRCP overnight showed markedly distended gallbladder with gallbladder wall thickening and pericholecystic fluid consistent with acute cholecystitis, as well as findings suggestive of early hepatic abscess versus malignancy, and dilated bile duct to 1 cm with smooth tapering at the level of the ampulla without choledocholithiasis.  The patient has not had any abdominal pain since that episode on December 23.  He has no known history of chronic liver disease.  Review of his labs in the past show that he has  had occasional isolated bilirubin elevation.  He does not drink alcohol and does not have  a history of heavy alcohol use.  Liver does not appear cirrhotic or have extensive fatty change.  The etiology for his significant liver enzyme elevation is not clear.  Certainly the appearance of the gallbladder and possible hepatic abscess suggest cholecystitis as the etiology, but the patient is not febrile, does not have leukocytosis and has minimal tenderness to palpation in the right upper quadrant.  HIDA scan is useful for evaluating for this, but will likely be limited in the setting of significant hyperbilirubinemia.  Although the common bile duct is mildly dilated, it is very unlikely that the patient's bilirubin of 18 is secondary to bile duct obstruction.  Agree that endoscopic ultrasound would be a reasonable way to further assess this, but we do not have EUS capabilities at the moment. Although the likelihood of a biliary obstruction remains fairly low, Dr. Russella Dar is agreeable to performing an ERCP tomorrow to clear the duct and ensure that there is no common duct obstruction causing his jaundice.  The possibility of intrinsic liver disease remains, to include autoimmune, infectious or drug-induced.  Will initiate workup to rule out identifiable causes.  Elevated liver enzymes/jaundice - HIDA scan tomorrow, may be limited given hyperbilirubinemia - Tentatively plan for ERCP tomorrow at 1:00 with Dr. Russella Dar, will need to hold heparin drip 6 hours prior to procedure - Will evaluate for autoimmune and infectious causes of liver disease  Rim-enhancing liver lesion in right hepatic lobe - Possible abscess - Will need repeat imaging in 3 months to exclude malignancy    Jenel Lucks, MD  07/11/2023, 1:39 PM [facility]

## 2023-07-11 NOTE — Progress Notes (Signed)
Progress Note  5 Days Post-Op  Subjective: Patient denies abdominal pain, nausea or vomiting. No pain with PO intake of clears.   Objective: Vital signs in last 24 hours: Temp:  [98.3 F (36.8 C)-99.1 F (37.3 C)] 99.1 F (37.3 C) (12/25 0325) Pulse Rate:  [95-109] 95 (12/25 0325) Resp:  [18] 18 (12/25 0325) BP: (132-166)/(61-65) 155/62 (12/25 0325) SpO2:  [93 %-97 %] 95 % (12/25 0325) Last BM Date : 07/03/23  Intake/Output from previous day: 12/24 0701 - 12/25 0700 In: 308.5 [I.V.:108.5; IV Piggyback:200] Out: 2025 [Urine:2025] Intake/Output this shift: No intake/output data recorded.  PE: General: pleasant, WD, obese male who is laying in bed in NAD Lungs:  Respiratory effort nonlabored Abd: soft, NT, ND Psych: A&Ox3 with an appropriate affect.    Lab Results:  Recent Labs    07/10/23 0757 07/11/23 0504  WBC 7.9 8.3  HGB 9.2* 8.9*  HCT 28.7* 28.6*  PLT 346 350   BMET Recent Labs    07/10/23 0757 07/11/23 0504  NA 134* 139  K 4.1 3.8  CL 102 104  CO2 18* 21*  GLUCOSE 239* 178*  BUN 15 14  CREATININE 1.49* 1.65*  CALCIUM 8.6* 8.9   PT/INR Recent Labs    07/10/23 1504  LABPROT 25.0*  INR 2.2*   CMP     Component Value Date/Time   NA 139 07/11/2023 0504   K 3.8 07/11/2023 0504   CL 104 07/11/2023 0504   CO2 21 (L) 07/11/2023 0504   GLUCOSE 178 (H) 07/11/2023 0504   BUN 14 07/11/2023 0504   CREATININE 1.65 (H) 07/11/2023 0504   CALCIUM 8.9 07/11/2023 0504   PROT 6.9 07/11/2023 0504   ALBUMIN 2.4 (L) 07/11/2023 0504   AST 1,023 (H) 07/11/2023 0504   ALT 565 (H) 07/11/2023 0504   ALKPHOS 376 (H) 07/11/2023 0504   BILITOT 18.3 (HH) 07/11/2023 0504   GFRNONAA 46 (L) 07/11/2023 0504   GFRAA >60 04/28/2019 0914   Lipase     Component Value Date/Time   LIPASE 90 (H) 07/10/2023 0757       Studies/Results: MR 3D Recon At Scanner Result Date: 07/10/2023 : CLINICAL DATA: Jaundice, right upper quadrant abdominal pain, gallstones,  indeterminate liver lesion EXAM: MRI ABDOMEN WITHOUT AND WITH CONTRAST (INCLUDING MRCP) TECHNIQUE: Multiplanar multisequence MR imaging of the abdomen was performed both before and after the administration of intravenous contrast. Heavily T2-weighted images of the biliary and pancreatic ducts were obtained, and three-dimensional MRCP images were rendered by post processing. CONTRAST: 10mL GADAVIST GADOBUTROL 1 MMOL/ML IV SOLN COMPARISON: Same-day CT abdomen pelvis CT abdomen pelvis, 11/10/2020 FINDINGS: Lower chest: No acute abnormality. Hepatobiliary: Heterogeneously rim enhancing lesion in the posterior right lobe of the liver measuring 2.2 x 1.4 cm (series 1401, image 77), with adjacent small heterogeneously enhancing areas of T2 hyperintensity posteriorly. Small volume of layering sludge in the gallbladder (series 8, image 20). Severe gallbladder wall thickening and trace pericholecystic fluid. Central common bile duct is dilated up to 1.0 cm in the pancreatic head, however the duct tapers smoothly to the ampulla without calculus or other evidence of obstruction (series 2, image 18). Pancreas: Unremarkable. No pancreatic ductal dilatation or surrounding inflammatory changes. Spleen: Normal in size without significant abnormality. Adrenals/Urinary Tract: Adrenal glands are unremarkable. Kidneys are normal, without renal calculi, solid lesion, or hydronephrosis. Stomach/Bowel: Stomach is within normal limits. No evidence of bowel wall thickening, distention, or inflammatory changes. Vascular/Lymphatic: Aortic atherosclerosis. No enlarged abdominal lymph nodes. Other: No  abdominal wall hernia or abnormality. No ascites. Musculoskeletal: No acute or significant osseous findings. IMPRESSION: 1. Small volume of layering sludge in the gallbladder. Severe gallbladder wall thickening and trace pericholecystic fluid. Findings are consistent with acute cholecystitis. 2. Heterogeneously rim enhancing lesion in the posterior  right lobe of the liver measuring 2.2 x 1.4 cm, with adjacent small heterogeneously enhancing areas of T2 hyperintensity posteriorly. Findings are most consistent with hepatic abscess in this clinical setting. Malignancy remains a differential consideration and at minimum, recommend follow-up imaging in 3 months following acute clinical presentation to ensure complete resolution. 3. Central common bile duct is dilated up to 1.0 cm in the pancreatic head, however the duct tapers smoothly to the ampulla without calculus or other evidence of obstruction. Aortic Atherosclerosis (ICD10-I70.0). Electronically Signed   By: Jearld Lesch M.D.   On: 07/10/2023 19:46   MR ABDOMEN MRCP W WO CONTAST Result Date: 07/10/2023 CLINICAL DATA:  Jaundice, right upper quadrant abdominal pain, gallstones, indeterminate liver lesion EXAM: MRI ABDOMEN WITHOUT AND WITH CONTRAST (INCLUDING MRCP) TECHNIQUE: Multiplanar multisequence MR imaging of the abdomen was performed both before and after the administration of intravenous contrast. Heavily T2-weighted images of the biliary and pancreatic ducts were obtained, and three-dimensional MRCP images were rendered by post processing. CONTRAST:  10mL GADAVIST GADOBUTROL 1 MMOL/ML IV SOLN COMPARISON:  Same-day CT abdomen pelvis CT abdomen pelvis, 11/10/2020 FINDINGS: Lower chest: No acute abnormality. Hepatobiliary: Heterogeneously rim enhancing lesion in the posterior right lobe of the liver measuring 2.2 x 1.4 cm (series 1401, image 77), with adjacent small heterogeneously enhancing areas of T2 hyperintensity posteriorly. Small volume of layering sludge in the gallbladder (series 8, image 20). Severe gallbladder wall thickening and trace pericholecystic fluid. Central common bile duct is dilated up to 1.0 cm in the pancreatic head, however the duct tapers smoothly to the ampulla without calculus or other evidence of obstruction (series 2, image 18). Pancreas: Unremarkable. No pancreatic  ductal dilatation or surrounding inflammatory changes. Spleen: Normal in size without significant abnormality. Adrenals/Urinary Tract: Adrenal glands are unremarkable. Kidneys are normal, without renal calculi, solid lesion, or hydronephrosis. Stomach/Bowel: Stomach is within normal limits. No evidence of bowel wall thickening, distention, or inflammatory changes. Vascular/Lymphatic: Aortic atherosclerosis. No enlarged abdominal lymph nodes. Other: No abdominal wall hernia or abnormality. No ascites. Musculoskeletal: No acute or significant osseous findings. IMPRESSION: 1. Small volume of layering sludge in the gallbladder. Severe gallbladder wall thickening and trace pericholecystic fluid. Findings are consistent with acute cholecystitis. 2. Heterogeneously rim enhancing lesion in the posterior right lobe of the liver measuring 2.2 x 1.4 cm, with adjacent small heterogeneously enhancing areas of T2 hyperintensity posteriorly. Findings are most consistent with hepatic abscess in this clinical setting. Malignancy remains a differential consideration and at minimum, recommend follow-up imaging in 3 months following acute clinical presentation to ensure complete resolution. 3. Central common bile duct is dilated up to 1.0 cm in the pancreatic head, however the duct tapers smoothly to the ampulla without calculus or other evidence of obstruction. Aortic Atherosclerosis (ICD10-I70.0). Electronically Signed   By: Jearld Lesch M.D.   On: 07/10/2023 19:42   DG Abd Portable 1V Result Date: 07/10/2023 CLINICAL DATA:  Upper abdominal pain. EXAM: PORTABLE ABDOMEN - 1 VIEW COMPARISON:  04/23/2019. FINDINGS: The bowel gas pattern is normal. No radio-opaque calculi or other significant radiographic abnormality are seen. IMPRESSION: Negative. Electronically Signed   By: Layla Maw M.D.   On: 07/10/2023 09:43   CT ABDOMEN PELVIS W CONTRAST Result  Date: 07/10/2023 CLINICAL DATA:  Right upper quadrant pain. EXAM: CT  ABDOMEN AND PELVIS WITH CONTRAST TECHNIQUE: Multidetector CT imaging of the abdomen and pelvis was performed using the standard protocol following bolus administration of intravenous contrast. RADIATION DOSE REDUCTION: This exam was performed according to the departmental dose-optimization program which includes automated exposure control, adjustment of the mA and/or kV according to patient size and/or use of iterative reconstruction technique. CONTRAST:  75mL OMNIPAQUE IOHEXOL 350 MG/ML SOLN COMPARISON:  11/10/2020 and 10/12/2014. FINDINGS: Lower chest: Linear bibasilar subsegmental atelectasis no or scarring. No pleural or pericardial effusion. Coronary atheromatous calcifications. Hepatobiliary: Hepatic lesion right lobe measuring 2.8 cm is indeterminate and new compared to old studies. This lesion can be assessed further with liver protocol CT or MRI. No intrahepatic ductal dilatation. Gallbladder is distended with pericholecystic inflammatory changes and numerous small calcified stones. The appearance is suggestive of acute cholecystitis. This can be confirmed with a HIDA scan, if indicated. Pancreas: Unremarkable. No pancreatic ductal dilatation or surrounding inflammatory changes. Spleen: Normal in size without focal abnormality. Adrenals/Urinary Tract: Right adrenal nodule measuring 2 cm is unchanged. No hydronephrosis or nephrolithiasis. Unremarkable urinary bladder. Stomach/Bowel: No bowel dilatation to suggest obstruction. No inflammatory mucosal process of the bowel to limits of CT evaluation. Diverticula in the descending and sigmoid colon. No evidence of diverticulitis. Vascular/Lymphatic: No significant vascular findings are present. No enlarged abdominal or pelvic lymph nodes. Reproductive: Prominent prostate measures 6.3 x 5.7 x 4.7 cm. Left-sided reservoir for penile prosthesis noted. Other: Small periumbilical abdominal wall hernia containing omental fat without evidence of incarceration or  herniated bowel. No abdominopelvic ascites. Musculoskeletal: Degenerative changes with vacuum discs at L1-2-3. IMPRESSION: 1. Findings suggestive of acute cholecystitis. 2. Indeterminate hepatic lesion. This can be assessed further with liver protocol CT or MRI. 3. Stable right adrenal nodule. 4. Diverticulosis without diverticulitis. 5. Prostatomegaly. Electronically Signed   By: Layla Maw M.D.   On: 07/10/2023 09:42    Anti-infectives: Anti-infectives (From admission, onward)    Start     Dose/Rate Route Frequency Ordered Stop   07/10/23 1215  cefTRIAXone (ROCEPHIN) 2 g in sodium chloride 0.9 % 100 mL IVPB        2 g 200 mL/hr over 30 Minutes Intravenous Every 24 hours 07/10/23 1127     07/10/23 1215  metroNIDAZOLE (FLAGYL) IVPB 500 mg        500 mg 100 mL/hr over 60 Minutes Intravenous Every 12 hours 07/10/23 1127     07/06/23 2200  ceFAZolin (ANCEF) IVPB 2g/100 mL premix        2 g 200 mL/hr over 30 Minutes Intravenous Every 6 hours 07/06/23 1800 07/07/23 1332   07/06/23 1200  ceFAZolin (ANCEF) IVPB 2g/100 mL premix        2 g 200 mL/hr over 30 Minutes Intravenous On call to O.R. 07/06/23 1112 07/06/23 1614   07/03/23 2200  vancomycin (VANCOREADY) IVPB 1250 mg/250 mL  Status:  Discontinued        1,250 mg 166.7 mL/hr over 90 Minutes Intravenous Every 24 hours 07/03/23 0549 07/05/23 0852   07/03/23 0345  ceFEPIme (MAXIPIME) 2 g in sodium chloride 0.9 % 100 mL IVPB  Status:  Discontinued        2 g 200 mL/hr over 30 Minutes Intravenous Every 12 hours 07/03/23 0357 07/05/23 0852   07/02/23 2130  Vancomycin (VANCOCIN) 1,500 mg in sodium chloride 0.9 % 500 mL IVPB       Placed in "Followed by" Linked  Group   1,500 mg 250 mL/hr over 120 Minutes Intravenous  Once 07/02/23 2013 07/03/23 0112   07/02/23 2030  vancomycin (VANCOCIN) IVPB 1000 mg/200 mL premix       Placed in "Followed by" Linked Group   1,000 mg 200 mL/hr over 60 Minutes Intravenous  Once 07/02/23 2013 07/02/23 2304    07/02/23 2015  ceFEPIme (MAXIPIME) 2 g in sodium chloride 0.9 % 100 mL IVPB        2 g 200 mL/hr over 30 Minutes Intravenous  Once 07/02/23 2003 07/02/23 2145        Assessment/Plan Cholelithiasis   Hyperbilirubinemia with transaminitis - no ttp on exam, no leukocytosis and symptoms are otherwise resolved - all argues against cholecystitis  - MRCP without choledocholithiasis but shows CBD dilated to 1 cm in pancreatic head but smooth tapering, liver lesion of uncertain etiology  - Tbili 18 from 13, and Alk Phos/AST/ALT all uptrending  - await further recommendations from GI but may need to consider ERCP/EUS - no plans for surgical intervention until etiology of all this more certain, we will follow   FEN: NPO, IVF VTE: SCDs ID: [email or web address]  - per TRH -  R thigh pain 2/2 hematoma  PAF s/p DCCV on xarelto  Hx of AV block IDDM CKD stage IIIa HTN HLD HS Hx of tobacco use, not current  Depression  Obesity   LOS: 8 days   I reviewed Consultant GI notes, hospitalist notes, last 24 h vitals and pain scores, last 48 h intake and output, last 24 h labs and trends, and last 24 h imaging results.   Juliet Rude, [facility] 07/11/2023, 8:41 AM Please see Amion for pager number during day hours 7:00am-4:30pm

## 2023-07-11 NOTE — Progress Notes (Addendum)
PHARMACY - ANTICOAGULATION CONSULT NOTE  Pharmacy Consult for Heparin Indication: atrial fibrillation  Allergies  Allergen Reactions   Penicillins Swelling    Patient Measurements: Height: 6\' 1"  (185.4 cm) Weight: 113.4 kg (250 lb) IBW/kg (Calculated) : 79.9 Heparin Dosing Weight: 104 kg   Vital Signs: Temp: 99.1 F (37.3 C) (12/25 0325) Temp Source: Oral (12/25 0325) BP: 155/62 (12/25 0325) Pulse Rate: 95 (12/25 0325)  Labs: Recent Labs    07/09/23 0444 07/10/23 0757 07/10/23 1504 07/11/23 0504  HGB 8.0* 9.2*  --  8.9*  HCT 25.2* 28.7*  --  28.6*  PLT 311 346  --  350  LABPROT  --   --  25.0*  --   INR  --   --  2.2*  --   CREATININE 1.48* 1.49*  --  1.65*    Estimated Creatinine Clearance: 58.1 mL/min (A) (by C-G formula based on SCr of 1.65 mg/dL (H)).   Medical History: Past Medical History:  Diagnosis Date   Atrial fibrillation [facility]) March 2015   Diabetes mellitus without complication (HCC)    NIDDM, type II   High cholesterol    Hydradenitis    Sebaceous cyst    hx of   Sigmoid diverticulitis    Wears glasses     Medications:  Scheduled:   docusate sodium  100 mg Oral BID   flecainide  50 mg Oral BID   insulin aspart  0-15 Units Subcutaneous Q6H   insulin glargine-yfgn  15 Units Subcutaneous BID   metoprolol tartrate  25 mg Oral BID   pantoprazole  40 mg Oral Daily   potassium chloride  20 mEq Oral Once   sertraline  100 mg Oral Daily   Infusions:   cefTRIAXone (ROCEPHIN)  IV Stopped (07/10/23 1345)   lactated ringers 100 mL/hr at 07/11/23 0806   metronidazole Stopped (07/11/23 0120)   PRN: acetaminophen, albuterol, bisacodyl, dicyclomine, methocarbamol **OR** [DISCONTINUED] methocarbamol (ROBAXIN) injection, metoCLOPramide **OR** [DISCONTINUED] metoCLOPramide (REGLAN) injection, naLOXone (NARCAN)  injection, [DISCONTINUED] ondansetron **OR** ondansetron (ZOFRAN) IV, oxyCODONE, polyethylene glycol  Assessment: 67 y.o. male with medical  history significant of paroxysmal A-fib status post DCCV in 2021 on Xarelto PTA, history of first and second-degree AV block not requiring pacemaker placement, and atrial tachycardia. Pharmacy was consulted for heparin dosing without a bolus. Patient has possibility of acute cholecystitis and per GI, possible procedure coming up so holding Xarelto currently. Patient's last dose of Xarelto was on 12/23.   Will check a heparin level and aPTT for a baseline and correlation assessment since patient was on a DOAC. CBC okay - hgb 8.9, plts 350  Goal of Therapy:  Heparin level 0.3-0.7 units/ml Monitor platelets by anticoagulation protocol: Yes   Plan:  Start heparin infusion at 1,400 units/hr Check anti-Xa level in 6 hours and daily while on heparin Check aPTT level for baseline Continue to monitor H&H and platelets F/u resuming Xarelto as appropriate  Roslyn Smiling, PharmD PGY1 Pharmacy Resident 07/11/2023 9:27 AM

## 2023-07-11 NOTE — Progress Notes (Signed)
Received critical of Total Bill of 18.3  Dr Beacher May notified.  GI is seeing patient.

## 2023-07-12 ENCOUNTER — Inpatient Hospital Stay (HOSPITAL_COMMUNITY): Payer: No Typology Code available for payment source | Admitting: Certified Registered Nurse Anesthetist

## 2023-07-12 ENCOUNTER — Inpatient Hospital Stay (HOSPITAL_COMMUNITY): Payer: No Typology Code available for payment source

## 2023-07-12 ENCOUNTER — Encounter (HOSPITAL_COMMUNITY)
Admission: EM | Disposition: A | Discharge status: Home Health | Payer: Self-pay | Source: Home / Self Care | Attending: Internal Medicine

## 2023-07-12 ENCOUNTER — Encounter (HOSPITAL_COMMUNITY): Payer: Self-pay | Admitting: Internal Medicine

## 2023-07-12 DIAGNOSIS — R933 Abnormal findings on diagnostic imaging of other parts of digestive tract: Secondary | ICD-10-CM | POA: Diagnosis not present

## 2023-07-12 DIAGNOSIS — R7989 Other specified abnormal findings of blood chemistry: Secondary | ICD-10-CM

## 2023-07-12 DIAGNOSIS — K812 Acute cholecystitis with chronic cholecystitis: Secondary | ICD-10-CM

## 2023-07-12 DIAGNOSIS — K838 Other specified diseases of biliary tract: Secondary | ICD-10-CM

## 2023-07-12 DIAGNOSIS — R17 Unspecified jaundice: Secondary | ICD-10-CM

## 2023-07-12 DIAGNOSIS — I4891 Unspecified atrial fibrillation: Secondary | ICD-10-CM | POA: Diagnosis not present

## 2023-07-12 DIAGNOSIS — Z7901 Long term (current) use of anticoagulants: Secondary | ICD-10-CM

## 2023-07-12 DIAGNOSIS — R748 Abnormal levels of other serum enzymes: Secondary | ICD-10-CM

## 2023-07-12 HISTORY — PX: REMOVAL OF STONES: SHX5545

## 2023-07-12 HISTORY — PX: SPHINCTEROTOMY: SHX5279

## 2023-07-12 HISTORY — PX: ERCP: SHX5425

## 2023-07-12 LAB — COMPREHENSIVE METABOLIC PANEL
ALT: 706 U/L — ABNORMAL HIGH (ref 0–44)
AST: 1002 U/L — ABNORMAL HIGH (ref 15–41)
Albumin: 2.4 g/dL — ABNORMAL LOW (ref 3.5–5.0)
Alkaline Phosphatase: 558 U/L — ABNORMAL HIGH (ref 38–126)
Anion gap: 12 (ref 5–15)
BUN: 16 mg/dL (ref 8–23)
CO2: 21 mmol/L — ABNORMAL LOW (ref 22–32)
Calcium: 8.6 mg/dL — ABNORMAL LOW (ref 8.9–10.3)
Chloride: 104 mmol/L (ref 98–111)
Creatinine, Ser: 1.83 mg/dL — ABNORMAL HIGH (ref 0.61–1.24)
GFR, Estimated: 40 mL/min — ABNORMAL LOW (ref >60)
Glucose, Bld: 154 mg/dL — ABNORMAL HIGH (ref 70–99)
Potassium: 4.1 mmol/L (ref 3.5–5.1)
Sodium: 137 mmol/L (ref 135–145)
Total Bilirubin: 18.6 mg/dL (ref <1.2)
Total Protein: 7 g/dL (ref 6.5–8.1)

## 2023-07-12 LAB — GLUCOSE, CAPILLARY
Glucose-Capillary: 140 mg/dL — ABNORMAL HIGH (ref 70–99)
Glucose-Capillary: 141 mg/dL — ABNORMAL HIGH (ref 70–99)
Glucose-Capillary: 147 mg/dL — ABNORMAL HIGH (ref 70–99)
Glucose-Capillary: 159 mg/dL — ABNORMAL HIGH (ref 70–99)
Glucose-Capillary: 216 mg/dL — ABNORMAL HIGH (ref 70–99)
Glucose-Capillary: 232 mg/dL — ABNORMAL HIGH (ref 70–99)

## 2023-07-12 LAB — MAGNESIUM: Magnesium: 2.5 mg/dL — ABNORMAL HIGH (ref 1.7–2.4)

## 2023-07-12 LAB — OSMOLALITY, URINE: Osmolality, Ur: 429 mosm/kg (ref 300–900)

## 2023-07-12 LAB — CBC WITH DIFFERENTIAL/PLATELET
Abs Immature Granulocytes: 0.12 10*3/uL — ABNORMAL HIGH (ref 0.00–0.07)
Basophils Absolute: 0.1 10*3/uL (ref 0.0–0.1)
Basophils Relative: 1 %
Eosinophils Absolute: 0.2 10*3/uL (ref 0.0–0.5)
Eosinophils Relative: 2 %
HCT: 27.5 % — ABNORMAL LOW (ref 39.0–52.0)
Hemoglobin: 8.7 g/dL — ABNORMAL LOW (ref 13.0–17.0)
Immature Granulocytes: 2 %
Lymphocytes Relative: 16 %
Lymphs Abs: 1.2 10*3/uL (ref 0.7–4.0)
MCH: 30.3 pg (ref 26.0–34.0)
MCHC: 31.6 g/dL (ref 30.0–36.0)
MCV: 95.8 fL (ref 80.0–100.0)
Monocytes Absolute: 1.1 10*3/uL — ABNORMAL HIGH (ref 0.1–1.0)
Monocytes Relative: 15 %
Neutro Abs: 5 10*3/uL (ref 1.7–7.7)
Neutrophils Relative %: 64 %
Platelets: 351 10*3/uL (ref 150–400)
RBC: 2.87 MIL/uL — ABNORMAL LOW (ref 4.22–5.81)
RDW: 16.4 % — ABNORMAL HIGH (ref 11.5–15.5)
WBC: 7.6 10*3/uL (ref 4.0–10.5)
nRBC: 1.7 % — ABNORMAL HIGH (ref 0.0–0.2)

## 2023-07-12 LAB — PROCALCITONIN: Procalcitonin: 0.6 ng/mL

## 2023-07-12 LAB — APTT: aPTT: 105 s — ABNORMAL HIGH (ref 24–36)

## 2023-07-12 LAB — C-REACTIVE PROTEIN: CRP: 19.9 mg/dL — ABNORMAL HIGH (ref <1.0)

## 2023-07-12 LAB — OSMOLALITY: Osmolality: 304 mosm/kg — ABNORMAL HIGH (ref 275–295)

## 2023-07-12 LAB — SODIUM, URINE, RANDOM: Sodium, Ur: 103 mmol/L

## 2023-07-12 LAB — HEPARIN LEVEL (UNFRACTIONATED): Heparin Unfractionated: 0.73 [IU]/mL — ABNORMAL HIGH (ref 0.30–0.70)

## 2023-07-12 LAB — CREATININE, URINE, RANDOM: Creatinine, Urine: 63 mg/dL

## 2023-07-12 SURGERY — ERCP, WITH INTERVENTION IF INDICATED
Anesthesia: General

## 2023-07-12 MED ORDER — PHENYLEPHRINE HCL-NACL 20-0.9 MG/250ML-% IV SOLN
INTRAVENOUS | Status: DC | PRN
  Administered 2023-07-12: 160 ug via INTRAVENOUS
  Administered 2023-07-12: 50 ug/min via INTRAVENOUS
  Administered 2023-07-12: 160 ug via INTRAVENOUS

## 2023-07-12 MED ORDER — DICLOFENAC SUPPOSITORY 100 MG
RECTAL | Status: DC | PRN
  Administered 2023-07-12: 100 mg via RECTAL

## 2023-07-12 MED ORDER — LACTATED RINGERS IV SOLN: INTRAVENOUS | Status: AC

## 2023-07-12 MED ORDER — ALBUMIN HUMAN 25 % IV SOLN
50.0000 g | Freq: Once | INTRAVENOUS | Status: AC
  Administered 2023-07-12: 12.5 g via INTRAVENOUS
  Filled 2023-07-12: qty 200

## 2023-07-12 MED ORDER — MIDAZOLAM HCL 2 MG/2ML IJ SOLN
INTRAMUSCULAR | Status: AC
  Filled 2023-07-12: qty 2

## 2023-07-12 MED ORDER — FENTANYL CITRATE (PF) 250 MCG/5ML IJ SOLN
INTRAMUSCULAR | Status: DC | PRN
  Administered 2023-07-12 (×2): 50 ug via INTRAVENOUS

## 2023-07-12 MED ORDER — HYDROMORPHONE HCL 1 MG/ML IJ SOLN
1.0000 mg | INTRAMUSCULAR | Status: DC | PRN
  Administered 2023-07-14 – 2023-07-16 (×5): 1 mg via INTRAVENOUS
  Filled 2023-07-12 (×5): qty 1

## 2023-07-12 MED ORDER — LIDOCAINE 2% (20 MG/ML) 5 ML SYRINGE
INTRAMUSCULAR | Status: DC | PRN
  Administered 2023-07-12: 100 mg via INTRAVENOUS

## 2023-07-12 MED ORDER — HEPARIN (PORCINE) 25000 UT/250ML-% IV SOLN: 1400.0000 [IU]/h | INTRAVENOUS | Status: DC

## 2023-07-12 MED ORDER — MORPHINE SULFATE (PF) 2 MG/ML IV SOLN
3.0000 mg | Freq: Once | INTRAVENOUS | Status: AC
  Administered 2023-07-12: 3 mg via INTRAVENOUS
  Filled 2023-07-12: qty 2

## 2023-07-12 MED ORDER — GLUCAGON HCL RDNA (DIAGNOSTIC) 1 MG IJ SOLR
INTRAMUSCULAR | Status: AC
  Filled 2023-07-12: qty 1

## 2023-07-12 MED ORDER — FENTANYL CITRATE (PF) 100 MCG/2ML IJ SOLN
INTRAMUSCULAR | Status: AC
  Filled 2023-07-12: qty 2

## 2023-07-12 MED ORDER — HYDRALAZINE HCL 20 MG/ML IJ SOLN
10.0000 mg | Freq: Four times a day (QID) | INTRAMUSCULAR | Status: DC | PRN
  Administered 2023-07-12 – 2023-07-14 (×4): 10 mg via INTRAVENOUS
  Filled 2023-07-12 (×4): qty 1

## 2023-07-12 MED ORDER — CIPROFLOXACIN IN D5W 400 MG/200ML IV SOLN
INTRAVENOUS | Status: DC | PRN
  Administered 2023-07-12: 400 mg via INTRAVENOUS

## 2023-07-12 MED ORDER — MIDAZOLAM HCL 2 MG/2ML IJ SOLN
INTRAMUSCULAR | Status: DC | PRN
  Administered 2023-07-12: 2 mg via INTRAVENOUS

## 2023-07-12 MED ORDER — TECHNETIUM TC 99M MEBROFENIN IV KIT
9.8000 | PACK | Freq: Once | INTRAVENOUS | Status: AC | PRN
  Administered 2023-07-12: 9.8 via INTRAVENOUS

## 2023-07-12 MED ORDER — PROPOFOL 10 MG/ML IV BOLUS
INTRAVENOUS | Status: DC | PRN
  Administered 2023-07-12: 150 mg via INTRAVENOUS

## 2023-07-12 MED ORDER — SUGAMMADEX SODIUM 200 MG/2ML IV SOLN
INTRAVENOUS | Status: DC | PRN
  Administered 2023-07-12: 450 mg via INTRAVENOUS

## 2023-07-12 MED ORDER — DICLOFENAC SUPPOSITORY 100 MG
RECTAL | Status: AC
  Filled 2023-07-12: qty 1

## 2023-07-12 MED ORDER — SODIUM CHLORIDE 0.9 % IV SOLN
INTRAVENOUS | Status: DC | PRN
  Administered 2023-07-12: 20 mL

## 2023-07-12 MED ORDER — MORPHINE SULFATE (PF) 2 MG/ML IV SOLN: 3.0000 mg | Freq: Once | INTRAVENOUS | Status: DC

## 2023-07-12 MED ORDER — ONDANSETRON HCL 4 MG/2ML IJ SOLN
INTRAMUSCULAR | Status: DC | PRN
  Administered 2023-07-12: 4 mg via INTRAVENOUS

## 2023-07-12 MED ORDER — DEXAMETHASONE SODIUM PHOSPHATE 10 MG/ML IJ SOLN
INTRAMUSCULAR | Status: DC | PRN
  Administered 2023-07-12: 10 mg via INTRAVENOUS

## 2023-07-12 MED ORDER — CIPROFLOXACIN IN D5W 400 MG/200ML IV SOLN
INTRAVENOUS | Status: AC
  Filled 2023-07-12: qty 200

## 2023-07-12 MED ORDER — OXYCODONE HCL 5 MG PO TABS
5.0000 mg | ORAL_TABLET | ORAL | Status: DC | PRN
  Administered 2023-07-13: 5 mg via ORAL
  Filled 2023-07-12: qty 1

## 2023-07-12 MED ORDER — ROCURONIUM BROMIDE 10 MG/ML (PF) SYRINGE
PREFILLED_SYRINGE | INTRAVENOUS | Status: DC | PRN
  Administered 2023-07-12: 60 mg via INTRAVENOUS

## 2023-07-12 MED ORDER — GLUCAGON HCL RDNA (DIAGNOSTIC) 1 MG IJ SOLR
INTRAMUSCULAR | Status: DC | PRN
  Administered 2023-07-12: .25 mg via INTRAVENOUS

## 2023-07-12 NOTE — Anesthesia Preprocedure Evaluation (Addendum)
Anesthesia Evaluation  Patient identified by MRN, date of birth, ID band Patient awake    Reviewed: Allergy & Precautions, NPO status , Patient's Chart, lab work & pertinent test results, reviewed documented beta blocker date and time   History of Anesthesia Complications Negative for: history of anesthetic complications  Airway Mallampati: III  TM Distance: >3 FB Neck ROM: Full    Dental  (+) Missing   Pulmonary former smoker   breath sounds clear to auscultation       Cardiovascular (-) hypertension(-) angina (-) Past MI and (-) CHF + dysrhythmias Atrial Fibrillation  Rhythm:Regular Rate:Normal     Neuro/Psych neg Seizures    GI/Hepatic PUD,,,  Endo/Other  diabetes, Type 2, Insulin Dependent    Renal/GU CRFRenal disease     Musculoskeletal   Abdominal   Peds  Hematology  (+) Blood dyscrasia, anemia   Anesthesia Other Findings   Reproductive/Obstetrics                             Anesthesia Physical Anesthesia Plan  ASA: 3  Anesthesia Plan: General   Post-op Pain Management:    Induction: Intravenous  PONV Risk Score and Plan: 2 and Ondansetron  Airway Management Planned: Oral ETT  Additional Equipment:   Intra-op Plan:   Post-operative Plan:   Informed Consent: I have reviewed the patients History and Physical, chart, labs and discussed the procedure including the risks, benefits and alternatives for the proposed anesthesia with the patient or authorized representative who has indicated his/her understanding and acceptance.     Dental advisory given  Plan Discussed with: CRNA  Anesthesia Plan Comments:         Anesthesia Quick Evaluation

## 2023-07-12 NOTE — Progress Notes (Signed)
PHARMACY - ANTICOAGULATION CONSULT NOTE  Pharmacy Consult for Heparin Indication: atrial fibrillation  Allergies  Allergen Reactions   Penicillins Swelling    Patient Measurements: Height: 6\' 1"  (185.4 cm) Weight: 113.4 kg (250 lb) IBW/kg (Calculated) : 79.9 Heparin Dosing Weight: 104 kg   Vital Signs: Temp: 97.6 F (36.4 C) (12/26 0812) Temp Source: Oral (12/26 0812) BP: 148/70 (12/26 0812) Pulse Rate: 100 (12/26 0812)  Labs: Recent Labs    07/10/23 0757 07/10/23 1504 07/11/23 0504 07/11/23 1640 07/12/23 0508  HGB 9.2*  --  8.9*  --  8.7*  HCT 28.7*  --  28.6*  --  27.5*  PLT 346  --  350  --  351  APTT  --   --   --  58* 105*  LABPROT  --  25.0*  --   --   --   INR  --  2.2*  --   --   --   HEPARINUNFRC  --   --   --  0.41 0.73*  CREATININE 1.49*  --  1.65*  --  1.83*    Estimated Creatinine Clearance: 52.4 mL/min (A) (by C-G formula based on SCr of 1.83 mg/dL (H)).   Medical History: Past Medical History:  Diagnosis Date   Atrial fibrillation [facility]) March 2015   Diabetes mellitus without complication (HCC)    NIDDM, type II   High cholesterol    Hydradenitis    Sebaceous cyst    hx of   Sigmoid diverticulitis    Wears glasses     Medications:  Scheduled:   docusate sodium  100 mg Oral BID   flecainide  50 mg Oral BID   insulin aspart  0-15 Units Subcutaneous Q6H   insulin glargine-yfgn  15 Units Subcutaneous BID   metoprolol tartrate  25 mg Oral BID   pantoprazole  40 mg Oral Daily   sertraline  100 mg Oral Daily   Infusions:   cefTRIAXone (ROCEPHIN)  IV 2 g (07/11/23 1234)   lactated ringers 75 mL/hr at 07/12/23 8119   metronidazole 500 mg (07/12/23 0047)   PRN: acetaminophen, albuterol, bisacodyl, dicyclomine, hydrALAZINE, methocarbamol **OR** [DISCONTINUED] methocarbamol (ROBAXIN) injection, metoCLOPramide **OR** [DISCONTINUED] metoCLOPramide (REGLAN) injection, naLOXone (NARCAN)  injection, [DISCONTINUED] ondansetron **OR** ondansetron  (ZOFRAN) IV, oxyCODONE, polyethylene glycol  Assessment: 67 y.o. male with medical history significant of paroxysmal A-fib status post DCCV in 2021 on Xarelto PTA, history of first and second-degree AV block not requiring pacemaker placement, and atrial tachycardia. Pharmacy was consulted for heparin dosing without a bolus. Patient has possibility of acute cholecystitis and per GI, possible procedure coming up so holding Xarelto currently. Patient's last dose of Xarelto was on 12/23.   Will check a heparin level and aPTT for a baseline and correlation assessment since patient was on a DOAC. CBC okay - hgb 8.9, plts 350   12/26 AM update:  Heparin level and aPTT supra-therapeutic Appear to be correlating with each other-can stop aPTT's Re-consulted by GI to resume heparin.   Goal of Therapy:  Heparin level 0.3-0.7 units/ml aPTT 66-102 Monitor platelets by anticoagulation protocol: Yes   Plan:  Restart heparin at 1400 units/hour  Check 6-hour heparin level F/U anti-coag plans after the procedure   Jani Gravel, PharmD Clinical Pharmacist  07/12/2023 8:54 AM

## 2023-07-12 NOTE — Progress Notes (Addendum)
PROGRESS NOTE                                                                                                                                                                                                             Patient Demographics:    Leroy Chen, is a 67 y.o. male, DOB - [birth year], WUJ:[id number]  Outpatient Primary MD for the patient is [name]    LOS - 9  Admit date - 07/02/2023    Chief Complaint  Patient presents with   Leg Pain       Brief Narrative (HPI from H&P)    67 y.o. male with medical history significant of paroxysmal A-fib status post DCCV in 2021 on Xarelto, history of first and second-degree AV block not requiring pacemaker placement, atrial tachycardia, insulin-dependent type 2 diabetes, CKD stage IIIa, hypertension, hyperlipidemia, former smoker, obesity, hidradenitis suppurativa, depression.  Patient was recently seen in the ED on 06/27/23 for fall and right leg injury.  X-ray of the femur showed no evidence of fracture.  His pain was felt to be related to right quadriceps tendon injury.  He was made nonweightbearing on crutches, knee immobilizer placed, and was discharged with plan for orthopedic follow-up.   Continue to have swelling of his right leg with right thigh pain and discomfort came to the ER where CT scan showed very large amount of fluid collection in his right lower extremity in the thigh area, he was admitted for further care.  Further workup suggest that he might have a very large hematoma in his right thigh due to being on Xarelto and having a soft tissue injury.   Subjective:   Patient in bed, appears comfortable, denies any headache, no fever, no chest pain or pressure, no shortness of breath , no abdominal pain. No focal weakness.   Assessment  & Plan :    Right lower extremity soft tissue injury with fall 1 week ago with right lower extremity swelling and  possible secondary cellulitis and foreign body did on CT scan.  He was diagnosed with severe sepsis at the time of admission as well.  I think patient's main issue is right thigh soft tissue hematoma which he incurred due to his severe fall 1 week prior to admission in the setting of him being on Xarelto, CT scan has been noted.  He is already been placed on antibiotics for possible secondary cellulitis and sepsis this has been clinically ruled out.  Orthopedics was consulted, MRI of the right leg obtained with large quadriceps tear and patellar injury and soft tissue hematoma, case discussed with Dr. Lajoyce Corners on 07/05/2023, stopped antibiotics as this is not sepsis or infection, he underwent surgical correction on 07/07/2023 by Dr. Lajoyce Corners of quadriceps tendon repair.  Tolerated the procedure well.  Per Ortho. Patient may be weightbearing as tolerated with the knee immobilizer. I will follow-up with him in 2 weeks in the office. He can discharge to home as soon as he is safe with therapy.  Initiate PT OT, prophylactic dose heparin on 07/07/2023 if tolerates well than home dose Xarelto on 07/08/2023 and discharge home with home PT OT as soon as his strength has improved.   Acute right upper quadrant abdominal pain starting night of 07/09/2023.  LFTs and total bilirubin are high, question if he has a CBD stone or has passed a CBD stone, still has his gallbladder, CT abdomen pelvis noted with possible cholecystitis and ?? liver abscess, HIDA scan ordered.  His symptoms have remarkably improved on 07/11/2023 however LFTs and total bilirubin still rising, his case was discussed with both GI and [facility] in detail on 07/11/2023, both are following.  Continue empiric antibiotics.  Will defer management of this issue to GI and general surgery.  On heparin drip instead of Xarelto since 07/11/2023.  Wait HIDA scan GI likely to do ERCP subsequently.    Sinus tachycardia - Due to #1 above supportive care and pain control.  Improved.   Normocytic anemia - Hemoglobin currently 11.1, was 16.5 in July 2024.  Patient is chronically anticoagulated although denies any symptoms of GI bleed.  Acute anemia is clearly due to soft tissue hematoma and blood loss in the right lower extremity.  Monitor.  Type screen also done.  No signs of GI bleed continue PPI.  Continue to monitor.  Will stop his Xarelto and place him on heparin for now.   AKI on CKD stage IIIa - Likely prerenal in etiology in the setting of blood loss, will hydrate with IV fluids and monitor H&H.  Check urine electrolytes and renal ultrasound.  Paroxysmal A-fib status post DCCV in 2021 - History of atrial tachycardia: Rate is controlled  - Continue  metoprolol, and flecainide.  Xarelto/heparin drip.  Hypertension  Blood pressure only on the soft side continue to hold ARB, reduce beta-blocker and hydrate on 07/08/2023.   Hyperlipidemia hold Crestor until his LFTs stabilize.   Depression  Continue Zoloft.   GERD  Continue Protonix.  Mild hyponatremia  Gentle hydration   High anion gap metabolic acidosis  - he takes Jardiance and had developed euglycemic DKA which has resolved after insulin drip, transition to subcu insulin and sliding scale, discontinue Jardiance permanently upon discharge.    Weakness and deconditioning.  PT OT, ambulate in the hallway, sit in chair in the daytime, prepare for home discharge on 07/10/2023 if he does well with PT.  He still feels extremely weak overall.    Insulin-dependent type 2 diabetes -on long-acting insulin twice a day along with sliding scale, dose adjusted while he is n.p.o. on 07/10/2023.   Lab Results  Component Value Date   HGBA1C 7.6 (H) 07/03/2023   CBG (last 3)  Recent Labs    07/11/23 2213 07/12/23 0048 07/12/23 0452  GLUCAP 153* 159* 141*         Condition -  Fair  Family Communication  : Wife [phone]  on 07/04/2023 at 7:53 AM, 07/08/2023, 07/11/2023  Code Status :  Full  Consults  :   Ortho, GI, general surgery  PUD Prophylaxis : PPI   Procedures  :     Renal ultrasound.    ERCP  HIDA scan.    MRCP.  1. Small volume of layering sludge in the gallbladder. Severe gallbladder wall thickening and trace pericholecystic fluid. Findings are consistent with acute cholecystitis. 2. Heterogeneously rim enhancing lesion in the posterior right lobe of the liver measuring 2.2 x 1.4 cm, with adjacent small heterogeneously enhancing areas of T2 hyperintensity posteriorly. Findings are most consistent with hepatic abscess in this clinical setting. Malignancy remains a differential consideration and at minimum, recommend follow-up imaging in 3 months following acute clinical presentation to ensure complete resolution. 3. Central common bile duct is dilated up to 1.0 cm in the pancreatic head, however the duct tapers smoothly to the ampulla without calculus or other evidence of obstruction. Aortic Atherosclerosis (ICD10-I70.0)  CT Abd - Pelvis - 1. Small volume of layering sludge in the gallbladder. Severe gallbladder wall thickening and trace pericholecystic fluid. Findings are consistent with acute cholecystitis. 2. Heterogeneously rim enhancing lesion in the posterior right lobe of the liver measuring 2.2 x 1.4 cm, with adjacent small heterogeneously enhancing areas of T2 hyperintensity posteriorly. Findings are most consistent with hepatic abscess in this clinical setting. Malignancy remains a differential consideration and at minimum, recommend follow-up imaging in 3 months following acute clinical presentation to ensure complete resolution. 3. Central common bile duct is dilated up to 1.0 cm in the pancreatic head, however the duct tapers smoothly to the ampulla without calculus or other evidence of obstruction. Aortic Atherosclerosis (ICD10-I70.0).   MRI.  Right lower extremity.    1. High-grade partial tear of the distal quadriceps tendon, involving the superficial central fibers arising  from the rectus femoris tendon as well as some of the medial fibers arising from the vastus medialis. The deep central and lateral fibers appear intact. 2. Large amount of heterogeneous soft tissue swelling anteriorly in the distal thigh, likely representing soft tissue hematoma. 3. Possible partial tear of the medial patellofemoral ligament at its patellar attachment. 4. The menisci, cruciate and collateral ligaments appear intact. 5. No evidence of acute fracture or osteonecrosis  Lower extremity venous duplex.  No DVT.    CT right hip.  No fracture.    CT scan tibia-fibula right leg.   1. Subcutaneous edema throughout the mid and lower thigh surrounding the knee. No evidence for soft tissue gas or fluid collection. 2. No acute osseous abnormality. 3. Thin linear radiopaque foreign body measuring 1 cm within the lateral subcutaneous tissues of the distal right lower extremity.      Disposition Plan  :    Status is: Inpatient   DVT Prophylaxis  :    SCDs Start: 07/06/23 1801 SCDs Start: 07/04/23 0813 Place and maintain sequential compression device Start: 07/04/23 0718   Lab Results  Component Value Date   PLT 351 07/12/2023    Diet :  Diet Order             Diet NPO time specified  Diet effective midnight                    Inpatient Medications  Scheduled Meds:  docusate sodium  100 mg Oral BID   flecainide  50 mg Oral BID   insulin aspart  0-15 Units Subcutaneous Q6H   insulin glargine-yfgn  15 Units Subcutaneous BID   metoprolol tartrate  25 mg Oral BID    morphine injection  3 mg Intravenous Once   pantoprazole  40 mg Oral Daily   sertraline  100 mg Oral Daily   Continuous Infusions:  albumin human     cefTRIAXone (ROCEPHIN)  IV 2 g (07/11/23 1234)   heparin     lactated ringers 75 mL/hr at 07/12/23 2956   metronidazole 500 mg (07/12/23 0047)     PRN Meds:.acetaminophen, albuterol, bisacodyl, dicyclomine, hydrALAZINE, methocarbamol **OR**  [DISCONTINUED] methocarbamol (ROBAXIN) injection, metoCLOPramide **OR** [DISCONTINUED] metoCLOPramide (REGLAN) injection, naLOXone (NARCAN)  injection, [DISCONTINUED] ondansetron **OR** ondansetron (ZOFRAN) IV, oxyCODONE, polyethylene glycol    Objective:   Vitals:   07/12/23 0517 07/12/23 0525 07/12/23 0630 07/12/23 0812  BP: (!) 160/68  (!) 165/66 (!) 148/70  Pulse:    100  Resp: 19   19  Temp:    97.6 F (36.4 C)  TempSrc:    Oral  SpO2: 94% 94%  95%  Weight:      Height:        Wt Readings from Last 3 Encounters:  07/02/23 113.4 kg  06/27/23 113.4 kg  02/12/23 113 kg     Intake/Output Summary (Last 24 hours) at 07/12/2023 1024 Last data filed at 07/12/2023 0400 Gross per 24 hour  Intake --  Output 2251 ml  Net -2251 ml     Physical Exam  Awake Alert, No new F.N deficits, Normal affect [place].AT,PERRAL Supple Neck, No JVD,   Symmetrical Chest wall movement, Good air movement bilaterally, CTAB RRR,No Gallops, Rubs or new Murmurs,  +ve B.Sounds, Abd Soft, minimal right upper quadrant tenderness Right knee and leg in knee immobilizer   Data Review:    Recent Labs  Lab 07/08/23 0439 07/09/23 0444 07/10/23 0757 07/11/23 0504 07/12/23 0508  WBC 8.5 7.1 7.9 8.3 7.6  HGB 8.3* 8.0* 9.2* 8.9* 8.7*  HCT 26.8* 25.2* 28.7* 28.6* 27.5*  PLT 262 311 346 350 351  MCV 98.9 96.6 94.7 95.7 95.8  MCH 30.6 30.7 30.4 29.8 30.3  MCHC 31.0 31.7 32.1 31.1 31.6  RDW 15.2 15.1 15.2 15.8* 16.4*  LYMPHSABS 1.1 1.1 0.4* 1.0 1.2  MONOABS 1.6* 1.4* 1.1* 1.3* 1.1*  EOSABS 0.1 0.1 0.0 0.1 0.2  BASOSABS 0.0 0.0 0.0 0.1 0.1    Recent Labs  Lab 07/06/23 0237 07/07/23 0500 07/08/23 0439 07/09/23 0444 07/10/23 0757 07/10/23 1014 07/10/23 1504 07/11/23 0504 07/12/23 0508  NA 137 138 138 138 134*  --   --  139 137  K 3.6 4.7 4.8 3.6 4.1  --   --  3.8 4.1  CL 109 105 106 106 102  --   --  104 104  CO2 20* 22 25 25  18*  --   --  21* 21*  ANIONGAP 8 11 7 7 14   --   --  14 12   GLUCOSE 132* 157* 162* 142* 239*  --   --  178* 154*  BUN 19 18 19 17 15   --   --  14 16  CREATININE 1.47* 1.21 1.84* 1.48* 1.49*  --   --  1.65* 1.83*  AST  --   --   --   --  407*  --   --  1,023* 1,002*  ALT  --   --   --   --  212*  --   --  565* 706*  ALKPHOS  --   --   --   --  271*  --   --  376* 558*  BILITOT  --   --   --   --  13.2*  --  14.9* 18.3* 18.6*  ALBUMIN  --   --   --   --  2.7*  --   --  2.4* 2.4*  CRP 12.9* 17.7* 24.6*  --   --  18.5*  --  17.8* 19.9*  PROCALCITON 0.13 0.14 0.47 0.35  --  0.50  --  0.70 0.60  INR  --   --   --   --   --   --  2.2*  --   --   BNP 98.5 74.8 90.7  --   --   --   --   --   --   MG 2.3 2.2 2.2  --   --   --   --  2.3 2.5*  CALCIUM 8.1* 8.6* 8.6* 8.4* 8.6*  --   --  8.9 8.6*      Recent Labs  Lab 07/06/23 0237 07/07/23 0500 07/08/23 0439 07/09/23 0444 07/10/23 0757 07/10/23 1014 07/10/23 1504 07/11/23 0504 07/12/23 0508  CRP 12.9* 17.7* 24.6*  --   --  18.5*  --  17.8* 19.9*  PROCALCITON 0.13 0.14 0.47 0.35  --  0.50  --  0.70 0.60  INR  --   --   --   --   --   --  2.2*  --   --   BNP 98.5 74.8 90.7  --   --   --   --   --   --   MG 2.3 2.2 2.2  --   --   --   --  2.3 2.5*  CALCIUM 8.1* 8.6* 8.6* 8.4* 8.6*  --   --  8.9 8.6*    --------------------------------------------------------------------------------------------------------------- No results found for: "CHOL", "HDL", "LDLCALC", "LDLDIRECT", "TRIG", "CHOLHDL"  Lab Results  Component Value Date   HGBA1C 7.6 (H) 07/03/2023     Micro Results Recent Results (from the past 240 hours)  Resp panel by RT-PCR (RSV, Flu A&B, Covid) Anterior Nasal Swab     Status: None   Collection Time: 07/02/23  4:59 PM   Specimen: Anterior Nasal Swab  Result Value Ref Range Status   SARS Coronavirus 2 by RT PCR NEGATIVE NEGATIVE Final   Influenza A by PCR NEGATIVE NEGATIVE Final   Influenza B by PCR NEGATIVE NEGATIVE Final    Comment: (NOTE) The Xpert Xpress SARS-CoV-2/FLU/RSV  plus assay is intended as an aid in the diagnosis of influenza from Nasopharyngeal swab specimens and should not be used as a sole basis for treatment. Nasal washings and aspirates are unacceptable for Xpert Xpress SARS-CoV-2/FLU/RSV testing.  Fact Sheet for Patients: BloggerCourse.com  Fact Sheet for Healthcare Providers: SeriousBroker.it  This test is not yet approved or cleared by the [place] FDA and has been authorized for detection and/or diagnosis of SARS-CoV-2 by FDA under an Emergency Use Authorization (EUA). This EUA will remain in effect (meaning this test can be used) for the duration of the COVID-19 declaration under Section 564(b)(1) of the Act, 21 U.S.C. section 360bbb-3(b)(1), unless the authorization is terminated or revoked.     Resp Syncytial Virus by PCR NEGATIVE NEGATIVE Final    Comment: (NOTE) Fact Sheet for Patients: BloggerCourse.com  Fact Sheet for Healthcare Providers: SeriousBroker.it  This test is not yet approved or cleared by  the [employer] and has been authorized for detection and/or diagnosis of SARS-CoV-2 by FDA under an Emergency Use Authorization (EUA). This EUA will remain in effect (meaning this test can be used) for the duration of the COVID-19 declaration under Section 564(b)(1) of the Act, 21 U.S.C. section 360bbb-3(b)(1), unless the authorization is terminated or revoked.  Performed at [facility], [address], [place], [place] [address]   Blood culture (routine x 2)     Status: None   Collection Time: 07/02/23  8:24 PM   Specimen: BLOOD RIGHT HAND  Result Value Ref Range Status   Specimen Description BLOOD RIGHT HAND  Final   Special Requests   Final    BOTTLES DRAWN AEROBIC ONLY Blood Culture results may not be optimal due to an inadequate volume of blood received in culture bottles   Culture   Final    NO  GROWTH 5 DAYS Performed at [facility], [address] [place], [place] [address]    Report Status 07/07/2023 FINAL  Final  Blood culture (routine x 2)     Status: None   Collection Time: 07/02/23  8:35 PM   Specimen: BLOOD  Result Value Ref Range Status   Specimen Description BLOOD SITE NOT SPECIFIED  Final   Special Requests   Final    BOTTLES DRAWN AEROBIC ONLY Blood Culture results may not be optimal due to an inadequate volume of blood received in culture bottles   Culture   Final    NO GROWTH 5 DAYS Performed at [facility], [address] [place], [place] [address]    Report Status 07/07/2023 FINAL  Final  MRSA Next Gen by PCR, Nasal     Status: None   Collection Time: 07/04/23 11:24 AM   Specimen: Nasal Mucosa; Nasal Swab  Result Value Ref Range Status   MRSA by PCR Next Gen NOT DETECTED NOT DETECTED Final    Comment: (NOTE) The GeneXpert MRSA Assay (FDA approved for NASAL specimens only), is one component of a comprehensive MRSA colonization surveillance program. It is not intended to diagnose MRSA infection nor to guide or monitor treatment for MRSA infections. Test performance is not FDA approved in patients less than 75 years old. Performed at [facility], [address] [place], [place] [address]   Surgical PCR screen     Status: None   Collection Time: 07/06/23  8:23 AM   Specimen: Nasal Mucosa; Nasal Swab  Result Value Ref Range Status   MRSA, PCR NEGATIVE NEGATIVE Final   Staphylococcus aureus NEGATIVE NEGATIVE Final    Comment: (NOTE) The Xpert SA Assay (FDA approved for NASAL specimens in patients 68 years of age and older), is one component of a comprehensive surveillance program. It is not intended to diagnose infection nor to guide or monitor treatment. Performed at [facility], [address] [place], [place] [address]     Radiology Reports MR 3D Recon At Scanner Result Date: 07/10/2023 : CLINICAL DATA: Jaundice,  right upper quadrant abdominal pain, gallstones, indeterminate liver lesion EXAM: MRI ABDOMEN WITHOUT AND WITH CONTRAST (INCLUDING MRCP) TECHNIQUE: Multiplanar multisequence MR imaging of the abdomen was performed both before and after the administration of intravenous contrast. Heavily T2-weighted images of the biliary and pancreatic ducts were obtained, and three-dimensional MRCP images were rendered by post processing. CONTRAST: 10mL GADAVIST GADOBUTROL 1 MMOL/ML IV SOLN COMPARISON: Same-day CT abdomen pelvis CT abdomen pelvis, 11/10/2020 FINDINGS: Lower chest:  No acute abnormality. Hepatobiliary: Heterogeneously rim enhancing lesion in the posterior right lobe of the liver measuring 2.2 x 1.4 cm (series 1401, image 77), with adjacent small heterogeneously enhancing areas of T2 hyperintensity posteriorly. Small volume of layering sludge in the gallbladder (series 8, image 20). Severe gallbladder wall thickening and trace pericholecystic fluid. Central common bile duct is dilated up to 1.0 cm in the pancreatic head, however the duct tapers smoothly to the ampulla without calculus or other evidence of obstruction (series 2, image 18). Pancreas: Unremarkable. No pancreatic ductal dilatation or surrounding inflammatory changes. Spleen: Normal in size without significant abnormality. Adrenals/Urinary Tract: Adrenal glands are unremarkable. Kidneys are normal, without renal calculi, solid lesion, or hydronephrosis. Stomach/Bowel: Stomach is within normal limits. No evidence of bowel wall thickening, distention, or inflammatory changes. Vascular/Lymphatic: Aortic atherosclerosis. No enlarged abdominal lymph nodes. Other: No abdominal wall hernia or abnormality. No ascites. Musculoskeletal: No acute or significant osseous findings. IMPRESSION: 1. Small volume of layering sludge in the gallbladder. Severe gallbladder wall thickening and trace pericholecystic fluid. Findings are consistent with acute cholecystitis. 2.  Heterogeneously rim enhancing lesion in the posterior right lobe of the liver measuring 2.2 x 1.4 cm, with adjacent small heterogeneously enhancing areas of T2 hyperintensity posteriorly. Findings are most consistent with hepatic abscess in this clinical setting. Malignancy remains a differential consideration and at minimum, recommend follow-up imaging in 3 months following acute clinical presentation to ensure complete resolution. 3. Central common bile duct is dilated up to 1.0 cm in the pancreatic head, however the duct tapers smoothly to the ampulla without calculus or other evidence of obstruction. Aortic Atherosclerosis (ICD10-I70.0). Electronically Signed   By: Jearld Lesch M.D.   On: 07/10/2023 19:46   MR ABDOMEN MRCP W WO CONTAST Result Date: 07/10/2023 CLINICAL DATA:  Jaundice, right upper quadrant abdominal pain, gallstones, indeterminate liver lesion EXAM: MRI ABDOMEN WITHOUT AND WITH CONTRAST (INCLUDING MRCP) TECHNIQUE: Multiplanar multisequence MR imaging of the abdomen was performed both before and after the administration of intravenous contrast. Heavily T2-weighted images of the biliary and pancreatic ducts were obtained, and three-dimensional MRCP images were rendered by post processing. CONTRAST:  10mL GADAVIST GADOBUTROL 1 MMOL/ML IV SOLN COMPARISON:  Same-day CT abdomen pelvis CT abdomen pelvis, 11/10/2020 FINDINGS: Lower chest: No acute abnormality. Hepatobiliary: Heterogeneously rim enhancing lesion in the posterior right lobe of the liver measuring 2.2 x 1.4 cm (series 1401, image 77), with adjacent small heterogeneously enhancing areas of T2 hyperintensity posteriorly. Small volume of layering sludge in the gallbladder (series 8, image 20). Severe gallbladder wall thickening and trace pericholecystic fluid. Central common bile duct is dilated up to 1.0 cm in the pancreatic head, however the duct tapers smoothly to the ampulla without calculus or other evidence of obstruction (series 2,  image 18). Pancreas: Unremarkable. No pancreatic ductal dilatation or surrounding inflammatory changes. Spleen: Normal in size without significant abnormality. Adrenals/Urinary Tract: Adrenal glands are unremarkable. Kidneys are normal, without renal calculi, solid lesion, or hydronephrosis. Stomach/Bowel: Stomach is within normal limits. No evidence of bowel wall thickening, distention, or inflammatory changes. Vascular/Lymphatic: Aortic atherosclerosis. No enlarged abdominal lymph nodes. Other: No abdominal wall hernia or abnormality. No ascites. Musculoskeletal: No acute or significant osseous findings. IMPRESSION: 1. Small volume of layering sludge in the gallbladder. Severe gallbladder wall thickening and trace pericholecystic fluid. Findings are consistent with acute cholecystitis. 2. Heterogeneously rim enhancing lesion in the posterior right lobe of the liver measuring 2.2 x 1.4 cm, with adjacent small heterogeneously enhancing areas of T2  hyperintensity posteriorly. Findings are most consistent with hepatic abscess in this clinical setting. Malignancy remains a differential consideration and at minimum, recommend follow-up imaging in 3 months following acute clinical presentation to ensure complete resolution. 3. Central common bile duct is dilated up to 1.0 cm in the pancreatic head, however the duct tapers smoothly to the ampulla without calculus or other evidence of obstruction. Aortic Atherosclerosis (ICD10-I70.0). Electronically Signed   By: Jearld Lesch M.D.   On: 07/10/2023 19:42   DG Abd Portable 1V Result Date: 07/10/2023 CLINICAL DATA:  Upper abdominal pain. EXAM: PORTABLE ABDOMEN - 1 VIEW COMPARISON:  04/23/2019. FINDINGS: The bowel gas pattern is normal. No radio-opaque calculi or other significant radiographic abnormality are seen. IMPRESSION: Negative. Electronically Signed   By: Layla Maw M.D.   On: 07/10/2023 09:43   CT ABDOMEN PELVIS W CONTRAST Result Date:  07/10/2023 CLINICAL DATA:  Right upper quadrant pain. EXAM: CT ABDOMEN AND PELVIS WITH CONTRAST TECHNIQUE: Multidetector CT imaging of the abdomen and pelvis was performed using the standard protocol following bolus administration of intravenous contrast. RADIATION DOSE REDUCTION: This exam was performed according to the departmental dose-optimization program which includes automated exposure control, adjustment of the mA and/or kV according to patient size and/or use of iterative reconstruction technique. CONTRAST:  75mL OMNIPAQUE IOHEXOL 350 MG/ML SOLN COMPARISON:  11/10/2020 and 10/12/2014. FINDINGS: Lower chest: Linear bibasilar subsegmental atelectasis no or scarring. No pleural or pericardial effusion. Coronary atheromatous calcifications. Hepatobiliary: Hepatic lesion right lobe measuring 2.8 cm is indeterminate and new compared to old studies. This lesion can be assessed further with liver protocol CT or MRI. No intrahepatic ductal dilatation. Gallbladder is distended with pericholecystic inflammatory changes and numerous small calcified stones. The appearance is suggestive of acute cholecystitis. This can be confirmed with a HIDA scan, if indicated. Pancreas: Unremarkable. No pancreatic ductal dilatation or surrounding inflammatory changes. Spleen: Normal in size without focal abnormality. Adrenals/Urinary Tract: Right adrenal nodule measuring 2 cm is unchanged. No hydronephrosis or nephrolithiasis. Unremarkable urinary bladder. Stomach/Bowel: No bowel dilatation to suggest obstruction. No inflammatory mucosal process of the bowel to limits of CT evaluation. Diverticula in the descending and sigmoid colon. No evidence of diverticulitis. Vascular/Lymphatic: No significant vascular findings are present. No enlarged abdominal or pelvic lymph nodes. Reproductive: Prominent prostate measures 6.3 x 5.7 x 4.7 cm. Left-sided reservoir for penile prosthesis noted. Other: Small periumbilical abdominal wall hernia  containing omental fat without evidence of incarceration or herniated bowel. No abdominopelvic ascites. Musculoskeletal: Degenerative changes with vacuum discs at L1-2-3. IMPRESSION: 1. Findings suggestive of acute cholecystitis. 2. Indeterminate hepatic lesion. This can be assessed further with liver protocol CT or MRI. 3. Stable right adrenal nodule. 4. Diverticulosis without diverticulitis. 5. Prostatomegaly. Electronically Signed   By: Layla Maw M.D.   On: 07/10/2023 09:42       Signature  -   Susa Raring M.D on 07/12/2023 at 10:24 AM   -  To page go to [email or web address]

## 2023-07-12 NOTE — H&P (View-Only) (Signed)
Patient ID: Leroy Chen, male   DOB: 01/10/1956, 67 y.o.   MRN: [id number]    Progress Note   Subjective   Day # 10 CC ; elevated LFT's, RUQ pain , abnormal imaging  IV heparin Rocephin/Flagyl  Autoimmune labs pending/ hepatitis serologies negative Labs today-WBC 7.6/hemoglobin 8.7/hematocrit 27.5 K+ 4.1 BUN 16/creatinine 1.83 T. bili 18.6/alk phos 558/AST 1002/ALT 706 CRP 19.9  Patient not in room, down for HIDA scan   Objective   Vital signs in last 24 hours: Temp:  [97.6 F (36.4 C)-98.8 F (37.1 C)] 97.6 F (36.4 C) (12/26 0812) Pulse Rate:  [100] 100 (12/26 0812) Resp:  [18-19] 19 (12/26 0812) BP: (133-165)/(64-70) 148/70 (12/26 0812) SpO2:  [94 %-98 %] 95 % (12/26 0812) Last BM Date : 07/11/23   Intake/Output from previous day: 12/25 0701 - 12/26 0700 In: 100 [IV Piggyback:100] Out: 2951 [Urine:2950; Stool:1] Intake/Output this shift: No intake/output data recorded.  Lab Results: Recent Labs    07/10/23 0757 07/11/23 0504 07/12/23 0508  WBC 7.9 8.3 7.6  HGB 9.2* 8.9* 8.7*  HCT 28.7* 28.6* 27.5*  PLT 346 350 351   BMET Recent Labs    07/10/23 0757 07/11/23 0504 07/12/23 0508  NA 134* 139 137  K 4.1 3.8 4.1  CL 102 104 104  CO2 18* 21* 21*  GLUCOSE 239* 178* 154*  BUN 15 14 16   CREATININE 1.49* 1.65* 1.83*  CALCIUM 8.6* 8.9 8.6*   LFT Recent Labs    07/10/23 1504 07/11/23 0504 07/12/23 0508  PROT  --    < > 7.0  ALBUMIN  --    < > 2.4*  AST  --    < > 1,002*  ALT  --    < > 706*  ALKPHOS  --    < > 558*  BILITOT 14.9*   < > 18.6*  BILIDIR 9.2*  --   --   IBILI 5.7*  --   --    < > = values in this interval not displayed.   PT/INR Recent Labs    07/10/23 1504  LABPROT 25.0*  INR 2.2*    Studies/Results: MR 3D Recon At Scanner Result Date: 07/10/2023 : CLINICAL DATA: Jaundice, right upper quadrant abdominal pain, gallstones, indeterminate liver lesion EXAM: MRI ABDOMEN WITHOUT AND WITH CONTRAST (INCLUDING MRCP)  TECHNIQUE: Multiplanar multisequence MR imaging of the abdomen was performed both before and after the administration of intravenous contrast. Heavily T2-weighted images of the biliary and pancreatic ducts were obtained, and three-dimensional MRCP images were rendered by post processing. CONTRAST: 10mL GADAVIST GADOBUTROL 1 MMOL/ML IV SOLN COMPARISON: Same-day CT abdomen pelvis CT abdomen pelvis, 11/10/2020 FINDINGS: Lower chest: No acute abnormality. Hepatobiliary: Heterogeneously rim enhancing lesion in the posterior right lobe of the liver measuring 2.2 x 1.4 cm (series 1401, image 77), with adjacent small heterogeneously enhancing areas of T2 hyperintensity posteriorly. Small volume of layering sludge in the gallbladder (series 8, image 20). Severe gallbladder wall thickening and trace pericholecystic fluid. Central common bile duct is dilated up to 1.0 cm in the pancreatic head, however the duct tapers smoothly to the ampulla without calculus or other evidence of obstruction (series 2, image 18). Pancreas: Unremarkable. No pancreatic ductal dilatation or surrounding inflammatory changes. Spleen: Normal in size without significant abnormality. Adrenals/Urinary Tract: Adrenal glands are unremarkable. Kidneys are normal, without renal calculi, solid lesion, or hydronephrosis. Stomach/Bowel: Stomach is within normal limits. No evidence of bowel wall thickening, distention, or inflammatory changes. Vascular/Lymphatic: Aortic atherosclerosis.  No enlarged abdominal lymph nodes. Other: No abdominal wall hernia or abnormality. No ascites. Musculoskeletal: No acute or significant osseous findings. IMPRESSION: 1. Small volume of layering sludge in the gallbladder. Severe gallbladder wall thickening and trace pericholecystic fluid. Findings are consistent with acute cholecystitis. 2. Heterogeneously rim enhancing lesion in the posterior right lobe of the liver measuring 2.2 x 1.4 cm, with adjacent small heterogeneously  enhancing areas of T2 hyperintensity posteriorly. Findings are most consistent with hepatic abscess in this clinical setting. Malignancy remains a differential consideration and at minimum, recommend follow-up imaging in 3 months following acute clinical presentation to ensure complete resolution. 3. Central common bile duct is dilated up to 1.0 cm in the pancreatic head, however the duct tapers smoothly to the ampulla without calculus or other evidence of obstruction. Aortic Atherosclerosis (ICD10-I70.0). Electronically Signed   By: Jearld Lesch M.D.   On: 07/10/2023 19:46   MR ABDOMEN MRCP W WO CONTAST Result Date: 07/10/2023 CLINICAL DATA:  Jaundice, right upper quadrant abdominal pain, gallstones, indeterminate liver lesion EXAM: MRI ABDOMEN WITHOUT AND WITH CONTRAST (INCLUDING MRCP) TECHNIQUE: Multiplanar multisequence MR imaging of the abdomen was performed both before and after the administration of intravenous contrast. Heavily T2-weighted images of the biliary and pancreatic ducts were obtained, and three-dimensional MRCP images were rendered by post processing. CONTRAST:  10mL GADAVIST GADOBUTROL 1 MMOL/ML IV SOLN COMPARISON:  Same-day CT abdomen pelvis CT abdomen pelvis, 11/10/2020 FINDINGS: Lower chest: No acute abnormality. Hepatobiliary: Heterogeneously rim enhancing lesion in the posterior right lobe of the liver measuring 2.2 x 1.4 cm (series 1401, image 77), with adjacent small heterogeneously enhancing areas of T2 hyperintensity posteriorly. Small volume of layering sludge in the gallbladder (series 8, image 20). Severe gallbladder wall thickening and trace pericholecystic fluid. Central common bile duct is dilated up to 1.0 cm in the pancreatic head, however the duct tapers smoothly to the ampulla without calculus or other evidence of obstruction (series 2, image 18). Pancreas: Unremarkable. No pancreatic ductal dilatation or surrounding inflammatory changes. Spleen: Normal in size without  significant abnormality. Adrenals/Urinary Tract: Adrenal glands are unremarkable. Kidneys are normal, without renal calculi, solid lesion, or hydronephrosis. Stomach/Bowel: Stomach is within normal limits. No evidence of bowel wall thickening, distention, or inflammatory changes. Vascular/Lymphatic: Aortic atherosclerosis. No enlarged abdominal lymph nodes. Other: No abdominal wall hernia or abnormality. No ascites. Musculoskeletal: No acute or significant osseous findings. IMPRESSION: 1. Small volume of layering sludge in the gallbladder. Severe gallbladder wall thickening and trace pericholecystic fluid. Findings are consistent with acute cholecystitis. 2. Heterogeneously rim enhancing lesion in the posterior right lobe of the liver measuring 2.2 x 1.4 cm, with adjacent small heterogeneously enhancing areas of T2 hyperintensity posteriorly. Findings are most consistent with hepatic abscess in this clinical setting. Malignancy remains a differential consideration and at minimum, recommend follow-up imaging in 3 months following acute clinical presentation to ensure complete resolution. 3. Central common bile duct is dilated up to 1.0 cm in the pancreatic head, however the duct tapers smoothly to the ampulla without calculus or other evidence of obstruction. Aortic Atherosclerosis (ICD10-I70.0). Electronically Signed   By: Jearld Lesch M.D.   On: 07/10/2023 19:42   CT ABDOMEN PELVIS W CONTRAST Result Date: 07/10/2023 CLINICAL DATA:  Right upper quadrant pain. EXAM: CT ABDOMEN AND PELVIS WITH CONTRAST TECHNIQUE: Multidetector CT imaging of the abdomen and pelvis was performed using the standard protocol following bolus administration of intravenous contrast. RADIATION DOSE REDUCTION: This exam was performed according to the departmental dose-optimization program  which includes automated exposure control, adjustment of the mA and/or kV according to patient size and/or use of iterative reconstruction technique.  CONTRAST:  75mL OMNIPAQUE IOHEXOL 350 MG/ML SOLN COMPARISON:  11/10/2020 and 10/12/2014. FINDINGS: Lower chest: Linear bibasilar subsegmental atelectasis no or scarring. No pleural or pericardial effusion. Coronary atheromatous calcifications. Hepatobiliary: Hepatic lesion right lobe measuring 2.8 cm is indeterminate and new compared to old studies. This lesion can be assessed further with liver protocol CT or MRI. No intrahepatic ductal dilatation. Gallbladder is distended with pericholecystic inflammatory changes and numerous small calcified stones. The appearance is suggestive of acute cholecystitis. This can be confirmed with a HIDA scan, if indicated. Pancreas: Unremarkable. No pancreatic ductal dilatation or surrounding inflammatory changes. Spleen: Normal in size without focal abnormality. Adrenals/Urinary Tract: Right adrenal nodule measuring 2 cm is unchanged. No hydronephrosis or nephrolithiasis. Unremarkable urinary bladder. Stomach/Bowel: No bowel dilatation to suggest obstruction. No inflammatory mucosal process of the bowel to limits of CT evaluation. Diverticula in the descending and sigmoid colon. No evidence of diverticulitis. Vascular/Lymphatic: No significant vascular findings are present. No enlarged abdominal or pelvic lymph nodes. Reproductive: Prominent prostate measures 6.3 x 5.7 x 4.7 cm. Left-sided reservoir for penile prosthesis noted. Other: Small periumbilical abdominal wall hernia containing omental fat without evidence of incarceration or herniated bowel. No abdominopelvic ascites. Musculoskeletal: Degenerative changes with vacuum discs at L1-2-3. IMPRESSION: 1. Findings suggestive of acute cholecystitis. 2. Indeterminate hepatic lesion. This can be assessed further with liver protocol CT or MRI. 3. Stable right adrenal nodule. 4. Diverticulosis without diverticulitis. 5. Prostatomegaly. Electronically Signed   By: Layla Maw M.D.   On: 07/10/2023 09:42       Assessment /  Plan:    #22  67 year old male admitted 10 days ago with right lower extremity swelling and pain after a fall sustaining a right lower extremity injury. Have a Large Fluid Collection/Hematoma in the Right Thigh on Admission in Setting of Chronic Anticoagulation Status Post Right Quadricep Repair 07/06/2023  #2 Acute right upper quadrant pain onset 07/10/2023 also noted at that time to have significantly elevated LFTs and CT was done and consistent with acute cholecystitis with multiple gallstones noted and distended gallbladder, pericholecystic inflammatory changes  Subsequent MRI/MRCP-there is small volume of layering sludge in the gallbladder severe gallbladder wall thickening and trace pericholecystic fluid again consistent with acute cholecystitis There is a 2.2 x 1.4 cm rim-enhancing lesion in the right lobe of the liver concerning for hepatic abscess versus malignancy. Central CBD dilation up to 1 cm in the pancreatic head however the duct tapers smoothly to the ampulla no stone or evidence of other obstruction seen  #3 insulin-dependent diabetes mellitus #4 Atrial fibrillation-had been on chronic Xarelto on hold, was covered with IV heparin yesterday #5 hypertension #6 Anemia multifactorial  Plan;  Await HIDA scan this morning Patient is scheduled for ERCP with Dr. Russella Dar this afternoon. Heparin has been held Continue IV Rocephin and Flagyl Further recommendations pending HIDA and ERCP   Principal Problem:   Cellulitis of right lower extremity Active Problems:   Severe sepsis (HCC)   Unspecified atrial fibrillation (HCC)   Hyponatremia   Acute kidney injury superimposed on chronic kidney disease (HCC)   Sinus tachycardia   Normocytic anemia   Metabolic acidosis   Serum total bilirubin elevated   Quadriceps tendon rupture, right, initial encounter   Abnormal finding on GI tract imaging   Dilated bile duct   Elevated liver enzymes   Anticoagulated on heparin  LOS: 9  days   Amy EsterwoodPA-C  07/12/2023, 8:29 AM    Attending Physician Note   I have taken an interval history, reviewed the chart and examined the patient. I performed a substantive portion of this encounter, including complete performance of at least one of the key components, in conjunction with the APP. I agree with the APP's note, impression and recommendations with my edits.    Claudette Head, MD [facility] See [facility], [facility], for our on call provider

## 2023-07-12 NOTE — Transfer of Care (Signed)
Immediate Anesthesia Transfer of Care Note  Patient: Leroy Chen  Procedure(s) Performed: ENDOSCOPIC RETROGRADE CHOLANGIOPANCREATOGRAPHY (ERCP) SPHINCTEROTOMY  Patient Location: Endoscopy Unit  Anesthesia Type:General  Level of Consciousness: drowsy and patient cooperative  Airway & Oxygen Therapy: Patient Spontanous Breathing  Post-op Assessment: Report given to RN and Post -op Vital signs reviewed and stable  Post vital signs: Reviewed and stable  Last Vitals:  Vitals Value Taken Time  BP    Temp    Pulse    Resp    SpO2      Last Pain:  Vitals:   07/12/23 1240  TempSrc: Temporal  PainSc: 0-No pain      Patients Stated Pain Goal: 0 (07/03/23 2201)  Complications: No notable events documented.

## 2023-07-12 NOTE — Progress Notes (Signed)
PT Cancellation Note  Patient Details Name: Leroy Chen MRN: [id number] DOB: Nov 18, 1955   Cancelled Treatment:    Reason Eval/Treat Not Completed: Patient at procedure or test/unavailable (Pt off the floor at nuclear medicine. Pt also noted to have procedure scheduled at 1pm. PT will follow up tomorrow.)   Gladys Damme 07/12/2023, 11:18 AM

## 2023-07-12 NOTE — Anesthesia Procedure Notes (Signed)
Procedure Name: Intubation Date/Time: 07/12/2023 1:16 PM  Performed by: Dairl Ponder, CRNAPre-anesthesia Checklist: Patient identified, Emergency Drugs available, Suction available and Patient being monitored Patient Re-evaluated:Patient Re-evaluated prior to induction Oxygen Delivery Method: Circle System Utilized Preoxygenation: Pre-oxygenation with 100% oxygen Induction Type: IV induction Ventilation: Mask ventilation without difficulty Laryngoscope Size: Mac and 4 Grade View: Grade III Tube type: Oral Tube size: 7.5 mm Number of attempts: 1 Airway Equipment and Method: Stylet and Oral airway Placement Confirmation: positive ETCO2 and breath sounds checked- equal and bilateral Secured at: 24 cm Tube secured with: Tape Dental Injury: Teeth and Oropharynx as per pre-operative assessment

## 2023-07-12 NOTE — Progress Notes (Signed)
Patient is alert, awake, pleasant. Heparin drip stopped at 0650 per MD,  and pharmacy confirmed for going to procedure at 1200 today. BP 165/ 66, hr 95 , was given 10 mg IV Hydralazine.

## 2023-07-12 NOTE — Progress Notes (Addendum)
Patient ID: Leroy Chen, male   DOB: 01/10/1956, 67 y.o.   MRN: [id number]    Progress Note   Subjective   Day # 10 CC ; elevated LFT's, RUQ pain , abnormal imaging  IV heparin Rocephin/Flagyl  Autoimmune labs pending/ hepatitis serologies negative Labs today-WBC 7.6/hemoglobin 8.7/hematocrit 27.5 K+ 4.1 BUN 16/creatinine 1.83 T. bili 18.6/alk phos 558/AST 1002/ALT 706 CRP 19.9  Patient not in room, down for HIDA scan   Objective   Vital signs in last 24 hours: Temp:  [97.6 F (36.4 C)-98.8 F (37.1 C)] 97.6 F (36.4 C) (12/26 0812) Pulse Rate:  [100] 100 (12/26 0812) Resp:  [18-19] 19 (12/26 0812) BP: (133-165)/(64-70) 148/70 (12/26 0812) SpO2:  [94 %-98 %] 95 % (12/26 0812) Last BM Date : 07/11/23   Intake/Output from previous day: 12/25 0701 - 12/26 0700 In: 100 [IV Piggyback:100] Out: 2951 [Urine:2950; Stool:1] Intake/Output this shift: No intake/output data recorded.  Lab Results: Recent Labs    07/10/23 0757 07/11/23 0504 07/12/23 0508  WBC 7.9 8.3 7.6  HGB 9.2* 8.9* 8.7*  HCT 28.7* 28.6* 27.5*  PLT 346 350 351   BMET Recent Labs    07/10/23 0757 07/11/23 0504 07/12/23 0508  NA 134* 139 137  K 4.1 3.8 4.1  CL 102 104 104  CO2 18* 21* 21*  GLUCOSE 239* 178* 154*  BUN 15 14 16   CREATININE 1.49* 1.65* 1.83*  CALCIUM 8.6* 8.9 8.6*   LFT Recent Labs    07/10/23 1504 07/11/23 0504 07/12/23 0508  PROT  --    < > 7.0  ALBUMIN  --    < > 2.4*  AST  --    < > 1,002*  ALT  --    < > 706*  ALKPHOS  --    < > 558*  BILITOT 14.9*   < > 18.6*  BILIDIR 9.2*  --   --   IBILI 5.7*  --   --    < > = values in this interval not displayed.   PT/INR Recent Labs    07/10/23 1504  LABPROT 25.0*  INR 2.2*    Studies/Results: MR 3D Recon At Scanner Result Date: 07/10/2023 : CLINICAL DATA: Jaundice, right upper quadrant abdominal pain, gallstones, indeterminate liver lesion EXAM: MRI ABDOMEN WITHOUT AND WITH CONTRAST (INCLUDING MRCP)  TECHNIQUE: Multiplanar multisequence MR imaging of the abdomen was performed both before and after the administration of intravenous contrast. Heavily T2-weighted images of the biliary and pancreatic ducts were obtained, and three-dimensional MRCP images were rendered by post processing. CONTRAST: 10mL GADAVIST GADOBUTROL 1 MMOL/ML IV SOLN COMPARISON: Same-day CT abdomen pelvis CT abdomen pelvis, 11/10/2020 FINDINGS: Lower chest: No acute abnormality. Hepatobiliary: Heterogeneously rim enhancing lesion in the posterior right lobe of the liver measuring 2.2 x 1.4 cm (series 1401, image 77), with adjacent small heterogeneously enhancing areas of T2 hyperintensity posteriorly. Small volume of layering sludge in the gallbladder (series 8, image 20). Severe gallbladder wall thickening and trace pericholecystic fluid. Central common bile duct is dilated up to 1.0 cm in the pancreatic head, however the duct tapers smoothly to the ampulla without calculus or other evidence of obstruction (series 2, image 18). Pancreas: Unremarkable. No pancreatic ductal dilatation or surrounding inflammatory changes. Spleen: Normal in size without significant abnormality. Adrenals/Urinary Tract: Adrenal glands are unremarkable. Kidneys are normal, without renal calculi, solid lesion, or hydronephrosis. Stomach/Bowel: Stomach is within normal limits. No evidence of bowel wall thickening, distention, or inflammatory changes. Vascular/Lymphatic: Aortic atherosclerosis.  No enlarged abdominal lymph nodes. Other: No abdominal wall hernia or abnormality. No ascites. Musculoskeletal: No acute or significant osseous findings. IMPRESSION: 1. Small volume of layering sludge in the gallbladder. Severe gallbladder wall thickening and trace pericholecystic fluid. Findings are consistent with acute cholecystitis. 2. Heterogeneously rim enhancing lesion in the posterior right lobe of the liver measuring 2.2 x 1.4 cm, with adjacent small heterogeneously  enhancing areas of T2 hyperintensity posteriorly. Findings are most consistent with hepatic abscess in this clinical setting. Malignancy remains a differential consideration and at minimum, recommend follow-up imaging in 3 months following acute clinical presentation to ensure complete resolution. 3. Central common bile duct is dilated up to 1.0 cm in the pancreatic head, however the duct tapers smoothly to the ampulla without calculus or other evidence of obstruction. Aortic Atherosclerosis (ICD10-I70.0). Electronically Signed   By: Jearld Lesch M.D.   On: 07/10/2023 19:46   MR ABDOMEN MRCP W WO CONTAST Result Date: 07/10/2023 CLINICAL DATA:  Jaundice, right upper quadrant abdominal pain, gallstones, indeterminate liver lesion EXAM: MRI ABDOMEN WITHOUT AND WITH CONTRAST (INCLUDING MRCP) TECHNIQUE: Multiplanar multisequence MR imaging of the abdomen was performed both before and after the administration of intravenous contrast. Heavily T2-weighted images of the biliary and pancreatic ducts were obtained, and three-dimensional MRCP images were rendered by post processing. CONTRAST:  10mL GADAVIST GADOBUTROL 1 MMOL/ML IV SOLN COMPARISON:  Same-day CT abdomen pelvis CT abdomen pelvis, 11/10/2020 FINDINGS: Lower chest: No acute abnormality. Hepatobiliary: Heterogeneously rim enhancing lesion in the posterior right lobe of the liver measuring 2.2 x 1.4 cm (series 1401, image 77), with adjacent small heterogeneously enhancing areas of T2 hyperintensity posteriorly. Small volume of layering sludge in the gallbladder (series 8, image 20). Severe gallbladder wall thickening and trace pericholecystic fluid. Central common bile duct is dilated up to 1.0 cm in the pancreatic head, however the duct tapers smoothly to the ampulla without calculus or other evidence of obstruction (series 2, image 18). Pancreas: Unremarkable. No pancreatic ductal dilatation or surrounding inflammatory changes. Spleen: Normal in size without  significant abnormality. Adrenals/Urinary Tract: Adrenal glands are unremarkable. Kidneys are normal, without renal calculi, solid lesion, or hydronephrosis. Stomach/Bowel: Stomach is within normal limits. No evidence of bowel wall thickening, distention, or inflammatory changes. Vascular/Lymphatic: Aortic atherosclerosis. No enlarged abdominal lymph nodes. Other: No abdominal wall hernia or abnormality. No ascites. Musculoskeletal: No acute or significant osseous findings. IMPRESSION: 1. Small volume of layering sludge in the gallbladder. Severe gallbladder wall thickening and trace pericholecystic fluid. Findings are consistent with acute cholecystitis. 2. Heterogeneously rim enhancing lesion in the posterior right lobe of the liver measuring 2.2 x 1.4 cm, with adjacent small heterogeneously enhancing areas of T2 hyperintensity posteriorly. Findings are most consistent with hepatic abscess in this clinical setting. Malignancy remains a differential consideration and at minimum, recommend follow-up imaging in 3 months following acute clinical presentation to ensure complete resolution. 3. Central common bile duct is dilated up to 1.0 cm in the pancreatic head, however the duct tapers smoothly to the ampulla without calculus or other evidence of obstruction. Aortic Atherosclerosis (ICD10-I70.0). Electronically Signed   By: Jearld Lesch M.D.   On: 07/10/2023 19:42   CT ABDOMEN PELVIS W CONTRAST Result Date: 07/10/2023 CLINICAL DATA:  Right upper quadrant pain. EXAM: CT ABDOMEN AND PELVIS WITH CONTRAST TECHNIQUE: Multidetector CT imaging of the abdomen and pelvis was performed using the standard protocol following bolus administration of intravenous contrast. RADIATION DOSE REDUCTION: This exam was performed according to the departmental dose-optimization program  which includes automated exposure control, adjustment of the mA and/or kV according to patient size and/or use of iterative reconstruction technique.  CONTRAST:  75mL OMNIPAQUE IOHEXOL 350 MG/ML SOLN COMPARISON:  11/10/2020 and 10/12/2014. FINDINGS: Lower chest: Linear bibasilar subsegmental atelectasis no or scarring. No pleural or pericardial effusion. Coronary atheromatous calcifications. Hepatobiliary: Hepatic lesion right lobe measuring 2.8 cm is indeterminate and new compared to old studies. This lesion can be assessed further with liver protocol CT or MRI. No intrahepatic ductal dilatation. Gallbladder is distended with pericholecystic inflammatory changes and numerous small calcified stones. The appearance is suggestive of acute cholecystitis. This can be confirmed with a HIDA scan, if indicated. Pancreas: Unremarkable. No pancreatic ductal dilatation or surrounding inflammatory changes. Spleen: Normal in size without focal abnormality. Adrenals/Urinary Tract: Right adrenal nodule measuring 2 cm is unchanged. No hydronephrosis or nephrolithiasis. Unremarkable urinary bladder. Stomach/Bowel: No bowel dilatation to suggest obstruction. No inflammatory mucosal process of the bowel to limits of CT evaluation. Diverticula in the descending and sigmoid colon. No evidence of diverticulitis. Vascular/Lymphatic: No significant vascular findings are present. No enlarged abdominal or pelvic lymph nodes. Reproductive: Prominent prostate measures 6.3 x 5.7 x 4.7 cm. Left-sided reservoir for penile prosthesis noted. Other: Small periumbilical abdominal wall hernia containing omental fat without evidence of incarceration or herniated bowel. No abdominopelvic ascites. Musculoskeletal: Degenerative changes with vacuum discs at L1-2-3. IMPRESSION: 1. Findings suggestive of acute cholecystitis. 2. Indeterminate hepatic lesion. This can be assessed further with liver protocol CT or MRI. 3. Stable right adrenal nodule. 4. Diverticulosis without diverticulitis. 5. Prostatomegaly. Electronically Signed   By: Layla Maw M.D.   On: 07/10/2023 09:42       Assessment /  Plan:    #22  67 year old male admitted 10 days ago with right lower extremity swelling and pain after a fall sustaining a right lower extremity injury. Have a Large Fluid Collection/Hematoma in the Right Thigh on Admission in Setting of Chronic Anticoagulation Status Post Right Quadricep Repair 07/06/2023  #2 Acute right upper quadrant pain onset 07/10/2023 also noted at that time to have significantly elevated LFTs and CT was done and consistent with acute cholecystitis with multiple gallstones noted and distended gallbladder, pericholecystic inflammatory changes  Subsequent MRI/MRCP-there is small volume of layering sludge in the gallbladder severe gallbladder wall thickening and trace pericholecystic fluid again consistent with acute cholecystitis There is a 2.2 x 1.4 cm rim-enhancing lesion in the right lobe of the liver concerning for hepatic abscess versus malignancy. Central CBD dilation up to 1 cm in the pancreatic head however the duct tapers smoothly to the ampulla no stone or evidence of other obstruction seen  #3 insulin-dependent diabetes mellitus #4 Atrial fibrillation-had been on chronic Xarelto on hold, was covered with IV heparin yesterday #5 hypertension #6 Anemia multifactorial  Plan;  Await HIDA scan this morning Patient is scheduled for ERCP with Dr. Russella Dar this afternoon. Heparin has been held Continue IV Rocephin and Flagyl Further recommendations pending HIDA and ERCP   Principal Problem:   Cellulitis of right lower extremity Active Problems:   Severe sepsis (HCC)   Unspecified atrial fibrillation (HCC)   Hyponatremia   Acute kidney injury superimposed on chronic kidney disease (HCC)   Sinus tachycardia   Normocytic anemia   Metabolic acidosis   Serum total bilirubin elevated   Quadriceps tendon rupture, right, initial encounter   Abnormal finding on GI tract imaging   Dilated bile duct   Elevated liver enzymes   Anticoagulated on heparin  LOS: 9  days   Amy EsterwoodPA-C  07/12/2023, 8:29 AM    Attending Physician Note   I have taken an interval history, reviewed the chart and examined the patient. I performed a substantive portion of this encounter, including complete performance of at least one of the key components, in conjunction with the APP. I agree with the APP's note, impression and recommendations with my edits.    Claudette Head, MD [facility] See [facility], [facility], for our on call provider

## 2023-07-12 NOTE — Progress Notes (Signed)
PHARMACY - ANTICOAGULATION CONSULT NOTE  Pharmacy Consult for Heparin Indication: atrial fibrillation  Allergies  Allergen Reactions   Penicillins Swelling    Patient Measurements: Height: 6\' 1"  (185.4 cm) Weight: 113.4 kg (250 lb) IBW/kg (Calculated) : 79.9 Heparin Dosing Weight: 104 kg   Vital Signs: Temp: 97.9 F (36.6 C) (12/26 0036) Temp Source: Oral (12/26 0036) BP: 160/68 (12/26 0517)  Labs: Recent Labs    07/10/23 0757 07/10/23 1504 07/11/23 0504 07/11/23 1640 07/12/23 0508  HGB 9.2*  --  8.9*  --  8.7*  HCT 28.7*  --  28.6*  --  27.5*  PLT 346  --  350  --  351  APTT  --   --   --  58* 105*  LABPROT  --  25.0*  --   --   --   INR  --  2.2*  --   --   --   HEPARINUNFRC  --   --   --  0.41 0.73*  CREATININE 1.49*  --  1.65*  --   --     Estimated Creatinine Clearance: 58.1 mL/min (A) (by C-G formula based on SCr of 1.65 mg/dL (H)).   Medical History: Past Medical History:  Diagnosis Date   Atrial fibrillation [facility]) March 2015   Diabetes mellitus without complication (HCC)    NIDDM, type II   High cholesterol    Hydradenitis    Sebaceous cyst    hx of   Sigmoid diverticulitis    Wears glasses     Medications:  Scheduled:   docusate sodium  100 mg Oral BID   flecainide  50 mg Oral BID   insulin aspart  0-15 Units Subcutaneous Q6H   insulin glargine-yfgn  15 Units Subcutaneous BID   metoprolol tartrate  25 mg Oral BID   pantoprazole  40 mg Oral Daily   sertraline  100 mg Oral Daily   Infusions:   cefTRIAXone (ROCEPHIN)  IV 2 g (07/11/23 1234)   heparin 1,500 Units/hr (07/11/23 1847)   lactated ringers     metronidazole 500 mg (07/12/23 0047)   PRN: acetaminophen, albuterol, bisacodyl, dicyclomine, hydrALAZINE, methocarbamol **OR** [DISCONTINUED] methocarbamol (ROBAXIN) injection, metoCLOPramide **OR** [DISCONTINUED] metoCLOPramide (REGLAN) injection, naLOXone (NARCAN)  injection, [DISCONTINUED] ondansetron **OR** ondansetron (ZOFRAN) IV,  oxyCODONE, polyethylene glycol  Assessment: 67 y.o. male with medical history significant of paroxysmal A-fib status post DCCV in 2021 on Xarelto PTA, history of first and second-degree AV block not requiring pacemaker placement, and atrial tachycardia. Pharmacy was consulted for heparin dosing without a bolus. Patient has possibility of acute cholecystitis and per GI, possible procedure coming up so holding Xarelto currently. Patient's last dose of Xarelto was on 12/23.   Will check a heparin level and aPTT for a baseline and correlation assessment since patient was on a DOAC. CBC okay - hgb 8.9, plts 350  PM: aPTT 58 (subtherapeutic), HL 0.41 - not correlating. On heparin 1400 units/hr - no issues with the infusion reported. No new bleeding issues documented.  12/26 AM update:  Heparin level and aPTT supra-therapeutic Appear to be correlating with each other-can stop aPTT's  Goal of Therapy:  Heparin level 0.3-0.7 units/ml aPTT 66-102 Monitor platelets by anticoagulation protocol: Yes   Plan:  Dec heparin to 1400 units/hr Heparin off 12/26 0900 for procedure F/U anti-coag plans after the procedure   Abran Duke, PharmD, BCPS Clinical Pharmacist Phone: [phone]

## 2023-07-12 NOTE — Progress Notes (Signed)
Progress Note   Subjective: Patient continues to deny pain, nausea or vomiting. Wife at bedside.   Objective: Vital signs in last 24 hours: Temp:  [97.6 F (36.4 C)-98.2 F (36.8 C)] 98.1 F (36.7 C) (12/26 1447) Pulse Rate:  [94-111] 107 (12/26 1506) Resp:  [18-23] 20 (12/26 1447) BP: (133-165)/(61-88) 149/61 (12/26 1447) SpO2:  [94 %-98 %] 96 % (12/26 1447) Last BM Date : 07/11/23  Intake/Output from previous day: 12/25 0701 - 12/26 0700 In: 100 [IV Piggyback:100] Out: 2951 [Urine:2950; Stool:1] Intake/Output this shift: Total I/O In: 400 [I.V.:400] Out: -   PE: General: pleasant, WD, obese male who is laying in bed in NAD HEENT: EOMI, sclera icteric  Lungs:  Respiratory effort nonlabored Abd: soft, NT, ND Psych: A&Ox3 with an appropriate affect.    Lab Results:  Recent Labs    07/11/23 0504 07/12/23 0508  WBC 8.3 7.6  HGB 8.9* 8.7*  HCT 28.6* 27.5*  PLT 350 351   BMET Recent Labs    07/11/23 0504 07/12/23 0508  NA 139 137  K 3.8 4.1  CL 104 104  CO2 21* 21*  GLUCOSE 178* 154*  BUN 14 16  CREATININE 1.65* 1.83*  CALCIUM 8.9 8.6*   PT/INR Recent Labs    07/10/23 1504  LABPROT 25.0*  INR 2.2*   CMP     Component Value Date/Time   NA 137 07/12/2023 0508   K 4.1 07/12/2023 0508   CL 104 07/12/2023 0508   CO2 21 (L) 07/12/2023 0508   GLUCOSE 154 (H) 07/12/2023 0508   BUN 16 07/12/2023 0508   CREATININE 1.83 (H) 07/12/2023 0508   CALCIUM 8.6 (L) 07/12/2023 0508   PROT 7.0 07/12/2023 0508   ALBUMIN 2.4 (L) 07/12/2023 0508   AST 1,002 (H) 07/12/2023 0508   ALT 706 (H) 07/12/2023 0508   ALKPHOS 558 (H) 07/12/2023 0508   BILITOT 18.6 (HH) 07/12/2023 0508   GFRNONAA 40 (L) 07/12/2023 0508   GFRAA >60 04/28/2019 0914   Lipase     Component Value Date/Time   LIPASE 90 (H) 07/10/2023 0757       Studies/Results: NM Hepatobiliary Liver Func Result Date: 07/12/2023 CLINICAL DATA:  Right upper quadrant abdominal pain EXAM: NUCLEAR  MEDICINE HEPATOBILIARY IMAGING TECHNIQUE: Sequential images of the abdomen were obtained out to 60 minutes following intravenous administration of radiopharmaceutical. RADIOPHARMACEUTICALS:  9.8 mCi Tc-25m  Choletec IV (initial) 1.5 millicurie Tc 99 Choletec (reinjection immediately prior to morphine administration) 3.0 mg IV morphine COMPARISON:  MRI 09/09/2022 FINDINGS: Prolonged blood pool activity may suggest some degree of hepatocellular dysfunction. Biliary activity is visible by 10 minutes with bowel activity at 25 minutes. Over the initial 60 minutes, gallbladder activity was not observed. Subsequently, 3.0 mg of IV morphine was administered to the patient. After this, imaging over 30 minutes demonstrated continued bowel activity but no gallbladder activity. IMPRESSION: 1. Non filling of the gallbladder even following morphine administration. Appearance favors obstructed cystic duct and acute cholecystitis. 2. Patent common bile duct. 3. Prolonged blood pool activity raising suspicion for mild hepatocellular dysfunction. Electronically Signed   By: Gaylyn Rong M.D.   On: 07/12/2023 12:36   MR 3D Recon At Scanner Result Date: 07/10/2023 : CLINICAL DATA: Jaundice, right upper quadrant abdominal pain, gallstones, indeterminate liver lesion EXAM: MRI ABDOMEN WITHOUT AND WITH CONTRAST (INCLUDING MRCP) TECHNIQUE: Multiplanar multisequence MR imaging of the abdomen was performed both before and after the administration of intravenous contrast. Heavily T2-weighted images of the biliary and  pancreatic ducts were obtained, and three-dimensional MRCP images were rendered by post processing. CONTRAST: 10mL GADAVIST GADOBUTROL 1 MMOL/ML IV SOLN COMPARISON: Same-day CT abdomen pelvis CT abdomen pelvis, 11/10/2020 FINDINGS: Lower chest: No acute abnormality. Hepatobiliary: Heterogeneously rim enhancing lesion in the posterior right lobe of the liver measuring 2.2 x 1.4 cm (series 1401, image 77), with adjacent  small heterogeneously enhancing areas of T2 hyperintensity posteriorly. Small volume of layering sludge in the gallbladder (series 8, image 20). Severe gallbladder wall thickening and trace pericholecystic fluid. Central common bile duct is dilated up to 1.0 cm in the pancreatic head, however the duct tapers smoothly to the ampulla without calculus or other evidence of obstruction (series 2, image 18). Pancreas: Unremarkable. No pancreatic ductal dilatation or surrounding inflammatory changes. Spleen: Normal in size without significant abnormality. Adrenals/Urinary Tract: Adrenal glands are unremarkable. Kidneys are normal, without renal calculi, solid lesion, or hydronephrosis. Stomach/Bowel: Stomach is within normal limits. No evidence of bowel wall thickening, distention, or inflammatory changes. Vascular/Lymphatic: Aortic atherosclerosis. No enlarged abdominal lymph nodes. Other: No abdominal wall hernia or abnormality. No ascites. Musculoskeletal: No acute or significant osseous findings. IMPRESSION: 1. Small volume of layering sludge in the gallbladder. Severe gallbladder wall thickening and trace pericholecystic fluid. Findings are consistent with acute cholecystitis. 2. Heterogeneously rim enhancing lesion in the posterior right lobe of the liver measuring 2.2 x 1.4 cm, with adjacent small heterogeneously enhancing areas of T2 hyperintensity posteriorly. Findings are most consistent with hepatic abscess in this clinical setting. Malignancy remains a differential consideration and at minimum, recommend follow-up imaging in 3 months following acute clinical presentation to ensure complete resolution. 3. Central common bile duct is dilated up to 1.0 cm in the pancreatic head, however the duct tapers smoothly to the ampulla without calculus or other evidence of obstruction. Aortic Atherosclerosis (ICD10-I70.0). Electronically Signed   By: Jearld Lesch M.D.   On: 07/10/2023 19:46   MR ABDOMEN MRCP W WO  CONTAST Result Date: 07/10/2023 CLINICAL DATA:  Jaundice, right upper quadrant abdominal pain, gallstones, indeterminate liver lesion EXAM: MRI ABDOMEN WITHOUT AND WITH CONTRAST (INCLUDING MRCP) TECHNIQUE: Multiplanar multisequence MR imaging of the abdomen was performed both before and after the administration of intravenous contrast. Heavily T2-weighted images of the biliary and pancreatic ducts were obtained, and three-dimensional MRCP images were rendered by post processing. CONTRAST:  10mL GADAVIST GADOBUTROL 1 MMOL/ML IV SOLN COMPARISON:  Same-day CT abdomen pelvis CT abdomen pelvis, 11/10/2020 FINDINGS: Lower chest: No acute abnormality. Hepatobiliary: Heterogeneously rim enhancing lesion in the posterior right lobe of the liver measuring 2.2 x 1.4 cm (series 1401, image 77), with adjacent small heterogeneously enhancing areas of T2 hyperintensity posteriorly. Small volume of layering sludge in the gallbladder (series 8, image 20). Severe gallbladder wall thickening and trace pericholecystic fluid. Central common bile duct is dilated up to 1.0 cm in the pancreatic head, however the duct tapers smoothly to the ampulla without calculus or other evidence of obstruction (series 2, image 18). Pancreas: Unremarkable. No pancreatic ductal dilatation or surrounding inflammatory changes. Spleen: Normal in size without significant abnormality. Adrenals/Urinary Tract: Adrenal glands are unremarkable. Kidneys are normal, without renal calculi, solid lesion, or hydronephrosis. Stomach/Bowel: Stomach is within normal limits. No evidence of bowel wall thickening, distention, or inflammatory changes. Vascular/Lymphatic: Aortic atherosclerosis. No enlarged abdominal lymph nodes. Other: No abdominal wall hernia or abnormality. No ascites. Musculoskeletal: No acute or significant osseous findings. IMPRESSION: 1. Small volume of layering sludge in the gallbladder. Severe gallbladder wall thickening and trace pericholecystic  fluid. Findings are consistent with acute cholecystitis. 2. Heterogeneously rim enhancing lesion in the posterior right lobe of the liver measuring 2.2 x 1.4 cm, with adjacent small heterogeneously enhancing areas of T2 hyperintensity posteriorly. Findings are most consistent with hepatic abscess in this clinical setting. Malignancy remains a differential consideration and at minimum, recommend follow-up imaging in 3 months following acute clinical presentation to ensure complete resolution. 3. Central common bile duct is dilated up to 1.0 cm in the pancreatic head, however the duct tapers smoothly to the ampulla without calculus or other evidence of obstruction. Aortic Atherosclerosis (ICD10-I70.0). Electronically Signed   By: Jearld Lesch M.D.   On: 07/10/2023 19:42    Anti-infectives: Anti-infectives (From admission, onward)    Start     Dose/Rate Route Frequency Ordered Stop   07/10/23 1215  cefTRIAXone (ROCEPHIN) 2 g in sodium chloride 0.9 % 100 mL IVPB        2 g 200 mL/hr over 30 Minutes Intravenous Every 24 hours 07/10/23 1127     07/10/23 1215  metroNIDAZOLE (FLAGYL) IVPB 500 mg        500 mg 100 mL/hr over 60 Minutes Intravenous Every 12 hours 07/10/23 1127     07/06/23 2200  ceFAZolin (ANCEF) IVPB 2g/100 mL premix        2 g 200 mL/hr over 30 Minutes Intravenous Every 6 hours 07/06/23 1800 07/07/23 1332   07/06/23 1200  ceFAZolin (ANCEF) IVPB 2g/100 mL premix        2 g 200 mL/hr over 30 Minutes Intravenous On call to O.R. 07/06/23 1112 07/06/23 1614   07/03/23 2200  vancomycin (VANCOREADY) IVPB 1250 mg/250 mL  Status:  Discontinued        1,250 mg 166.7 mL/hr over 90 Minutes Intravenous Every 24 hours 07/03/23 0549 07/05/23 0852   07/03/23 0345  ceFEPIme (MAXIPIME) 2 g in sodium chloride 0.9 % 100 mL IVPB  Status:  Discontinued        2 g 200 mL/hr over 30 Minutes Intravenous Every 12 hours 07/03/23 0357 07/05/23 0852   07/02/23 2130  Vancomycin (VANCOCIN) 1,500 mg in sodium  chloride 0.9 % 500 mL IVPB       Placed in "Followed by" Linked Group   1,500 mg 250 mL/hr over 120 Minutes Intravenous  Once 07/02/23 2013 07/03/23 0112   07/02/23 2030  vancomycin (VANCOCIN) IVPB 1000 mg/200 mL premix       Placed in "Followed by" Linked Group   1,000 mg 200 mL/hr over 60 Minutes Intravenous  Once 07/02/23 2013 07/02/23 2304   07/02/23 2015  ceFEPIme (MAXIPIME) 2 g in sodium chloride 0.9 % 100 mL IVPB        2 g 200 mL/hr over 30 Minutes Intravenous  Once 07/02/23 2003 07/02/23 2145        Assessment/Plan Cholelithiasis   Hyperbilirubinemia with transaminitis - no ttp on exam, no leukocytosis and symptoms are otherwise resolved - all argues against cholecystitis  - MRCP without choledocholithiasis but shows CBD dilated to 1 cm in pancreatic head but smooth tapering, liver lesion of uncertain etiology  - Tbili remains at 18, Alk Phos 558, AST/ALT 1002/706 - HIDA today with no filling of the gallbladder, patent CBD, prolonged blood pool activity raising suspicion for mild hepatocellular dysfunction  - ERCP with some sludge in CBD, biliary sphincterotomy performed, no mass - in setting of resolution of symptoms with no pain on exam would not recommend cholecystectomy at this time - reasonable to continue  abx with positive HIDA - AM LFTs  FEN: CLD, LR @75cc /h per TR[facility] VTE: SCDs ID: [email or web address]  - per [facility] -  R thigh pain 2/2 hematoma  PAF s/p DCCV on xarelto  Hx of AV block IDDM CKD stage IIIa HTN HLD HS Hx of tobacco use, not current  Depression  Obesity   LOS: 9 days   I reviewed Consultant GI notes, hospitalist notes, last 24 h vitals and pain scores, last 48 h intake and output, last 24 h labs and trends, and last 24 h imaging results.   Juliet Rude, [facility] 07/12/2023, 3:20 PM Please see Amion for pager number during day hours 7:00am-4:30pm

## 2023-07-12 NOTE — Interval H&P Note (Signed)
History and Physical Interval Note:  07/12/2023 12:59 PM  Leroy Chen  has presented today for surgery, with the diagnosis of Jaundice, dilated common bile duct.  The various methods of treatment have been discussed with the patient and family. After consideration of risks, benefits and other options for treatment, the patient has consented to  Procedure(s): ENDOSCOPIC RETROGRADE CHOLANGIOPANCREATOGRAPHY (ERCP) (N/A) as a surgical intervention.  The patient's history has been reviewed, patient examined, no change in status, stable for surgery.  I have reviewed the patient's chart and labs.  Questions were answered to the patient's satisfaction.     Venita Lick. Russella Dar

## 2023-07-12 NOTE — Op Note (Addendum)
[facility] Patient Name: Leroy Chen Procedure Date : 07/12/2023 MRN: [id number] Attending MD: Meryl Dare , MD, [phone] Date of Birth: [birth year] CSN: [id number] Age: 67 Admit Type: Inpatient Procedure:                ERCP Indications:              Abnormal MRCP, Jaundice, Elevated liver enzymes Providers:                Venita Lick. Russella Dar, MD, Rogue Jury, RN, Harrington Challenger, Technician Referring MD:             [facility], [facility] Medicines:                General Anesthesia Complications:            No immediate complications. Estimated Blood Loss:     Estimated blood loss: none. Procedure:                Pre-Anesthesia Assessment:                           - Prior to the procedure, a History and Physical                            was performed, and patient medications and                            allergies were reviewed. The patient's tolerance of                            previous anesthesia was also reviewed. The risks                            and benefits of the procedure and the sedation                            options and risks were discussed with the patient.                            All questions were answered, and informed consent                            was obtained. Prior Anticoagulants: The patient has                            taken heparin, last dose was day of procedure. ASA                            Grade Assessment: III - A patient with severe                            systemic disease. After reviewing the risks and  benefits, the patient was deemed in satisfactory                            condition to undergo the procedure.                           After obtaining informed consent, the scope was                            passed under direct vision. Throughout the                            procedure, the patient's blood pressure, pulse, and                            oxygen  saturations were monitored continuously. The                            [id number]-[id number] ([id number] Olympus duodenoscope was                            introduced through the mouth, and used to inject                            contrast into and used to inject contrast into the                            bile duct. The ERCP was accomplished without                            difficulty. The patient tolerated the procedure                            well. Scope In: Scope Out: Findings:      The scout film was normal. The esophagus was successfully intubated       under direct vision. The scope was advanced to a normal major papilla in       the descending duodenum without detailed examination of the pharynx,       larynx and associated structures, and upper GI tract. Very limited exam       of the upper GI tract was grossly normal. A straight Roadrunner wire was       passed into the biliary tree. The short-nosed traction sphincterotome       was passed over the guidewire and the bile duct was then deeply       cannulated. Contrast was injected. I personally interpreted the bile       duct images. There was appropriate flow of contrast through the ducts.       Image quality was adequate. Contrast extended to the entire biliary tree       except the cystic duct and gallbladder which were not filled. The common       bile duct was mildly dilated and diffusely dilated, uncertainly       significance. The largest diameter was 9 mm. The common bile duct       contained  small filling defects thought to be sludge. A 7 mm biliary       sphincterotomy was made with a traction (standard) sphincterotome using       ERBE electrocautery. There was no post-sphincterotomy bleeding. The       biliary tree was swept with a 9 mm balloon starting at the bifurcation.       A small amount of sludge was swept from the duct. Three pull throughs       performed. The final occulsion cholangiogram was clear. The PD was not        entered by intention. Very good biliary drainage noted at the end of the       procedure. Impression:               - The examination was suspicious for CBD sludge.                           - The common bile duct was mildly dilated,                            uncertain significance.                           - A biliary sphincterotomy was performed.                           - The biliary tree was swept and sludge was found. Recommendation:           - Return patient to hospital ward for ongoing care.                           - Observe patient's clinical course following                            today's ERCP with therapeutic intervention.                           - Trend LFTs.                           - OK to resume IV heparin without a bolus after                            2000 today.                           - General surgery to review. Imaging is concerning                            for acute cholecystitis however his acute abdominal                            pain has resolved. Procedure Code(s):        --- Professional ---                           [phone], Endoscopic retrograde  cholangiopancreatography (ERCP); with removal of                            calculi/debris from biliary/pancreatic duct(s)                           43262, Endoscopic retrograde                            cholangiopancreatography (ERCP); with                            sphincterotomy/papillotomy                           [phone], Endoscopic catheterization of the biliary                            ductal system, radiological supervision and                            interpretation Diagnosis Code(s):        --- Professional ---                           R17, Unspecified jaundice                           R74.8, Abnormal levels of other serum enzymes                           K83.8, Other specified diseases of biliary tract                           R93.2, Abnormal  findings on diagnostic imaging of                            liver and biliary tract CPT copyright 2022 [employer]. All rights reserved. The codes documented in this report are preliminary and upon coder review may  be revised to meet current compliance requirements. Meryl Dare, MD 07/12/2023 2:09:18 PM This report has been signed electronically. Number of Addenda: 0

## 2023-07-13 ENCOUNTER — Encounter (HOSPITAL_COMMUNITY): Payer: Self-pay | Admitting: Gastroenterology

## 2023-07-13 DIAGNOSIS — K8001 Calculus of gallbladder with acute cholecystitis with obstruction: Secondary | ICD-10-CM

## 2023-07-13 DIAGNOSIS — R933 Abnormal findings on diagnostic imaging of other parts of digestive tract: Secondary | ICD-10-CM | POA: Diagnosis not present

## 2023-07-13 DIAGNOSIS — I4891 Unspecified atrial fibrillation: Secondary | ICD-10-CM | POA: Diagnosis not present

## 2023-07-13 DIAGNOSIS — R7989 Other specified abnormal findings of blood chemistry: Secondary | ICD-10-CM

## 2023-07-13 DIAGNOSIS — R17 Unspecified jaundice: Secondary | ICD-10-CM | POA: Diagnosis not present

## 2023-07-13 DIAGNOSIS — K838 Other specified diseases of biliary tract: Secondary | ICD-10-CM | POA: Diagnosis not present

## 2023-07-13 LAB — COMPREHENSIVE METABOLIC PANEL
ALT: 482 U/L — ABNORMAL HIGH (ref 0–44)
AST: 330 U/L — ABNORMAL HIGH (ref 15–41)
Albumin: 2.8 g/dL — ABNORMAL LOW (ref 3.5–5.0)
Alkaline Phosphatase: 563 U/L — ABNORMAL HIGH (ref 38–126)
Anion gap: 11 (ref 5–15)
BUN: 24 mg/dL — ABNORMAL HIGH (ref 8–23)
CO2: 19 mmol/L — ABNORMAL LOW (ref 22–32)
Calcium: 8.7 mg/dL — ABNORMAL LOW (ref 8.9–10.3)
Chloride: 106 mmol/L (ref 98–111)
Creatinine, Ser: 1.84 mg/dL — ABNORMAL HIGH (ref 0.61–1.24)
GFR, Estimated: 40 mL/min — ABNORMAL LOW (ref >60)
Glucose, Bld: 199 mg/dL — ABNORMAL HIGH (ref 70–99)
Potassium: 4.2 mmol/L (ref 3.5–5.1)
Sodium: 136 mmol/L (ref 135–145)
Total Bilirubin: 9.1 mg/dL — ABNORMAL HIGH (ref <1.2)
Total Protein: 7.3 g/dL (ref 6.5–8.1)

## 2023-07-13 LAB — GLUCOSE, CAPILLARY
Glucose-Capillary: 181 mg/dL — ABNORMAL HIGH (ref 70–99)
Glucose-Capillary: 182 mg/dL — ABNORMAL HIGH (ref 70–99)
Glucose-Capillary: 190 mg/dL — ABNORMAL HIGH (ref 70–99)
Glucose-Capillary: 199 mg/dL — ABNORMAL HIGH (ref 70–99)

## 2023-07-13 LAB — HEPARIN LEVEL (UNFRACTIONATED)
Heparin Unfractionated: 0.21 [IU]/mL — ABNORMAL LOW (ref 0.30–0.70)
Heparin Unfractionated: 0.46 [IU]/mL (ref 0.30–0.70)

## 2023-07-13 LAB — C-REACTIVE PROTEIN: CRP: 16.9 mg/dL — ABNORMAL HIGH (ref <1.0)

## 2023-07-13 LAB — CBC WITH DIFFERENTIAL/PLATELET
Abs Immature Granulocytes: 0.17 10*3/uL — ABNORMAL HIGH (ref 0.00–0.07)
Basophils Absolute: 0 10*3/uL (ref 0.0–0.1)
Basophils Relative: 0 %
Eosinophils Absolute: 0 10*3/uL (ref 0.0–0.5)
Eosinophils Relative: 0 %
HCT: 27.1 % — ABNORMAL LOW (ref 39.0–52.0)
Hemoglobin: 8.3 g/dL — ABNORMAL LOW (ref 13.0–17.0)
Immature Granulocytes: 2 %
Lymphocytes Relative: 10 %
Lymphs Abs: 0.9 10*3/uL (ref 0.7–4.0)
MCH: 29 pg (ref 26.0–34.0)
MCHC: 30.6 g/dL (ref 30.0–36.0)
MCV: 94.8 fL (ref 80.0–100.0)
Monocytes Absolute: 0.9 10*3/uL (ref 0.1–1.0)
Monocytes Relative: 10 %
Neutro Abs: 6.8 10*3/uL (ref 1.7–7.7)
Neutrophils Relative %: 78 %
Platelets: 369 10*3/uL (ref 150–400)
RBC: 2.86 MIL/uL — ABNORMAL LOW (ref 4.22–5.81)
RDW: 16.6 % — ABNORMAL HIGH (ref 11.5–15.5)
WBC: 8.8 10*3/uL (ref 4.0–10.5)
nRBC: 1.9 % — ABNORMAL HIGH (ref 0.0–0.2)

## 2023-07-13 LAB — PROCALCITONIN: Procalcitonin: 2.71 ng/mL

## 2023-07-13 LAB — ANTI-SMOOTH MUSCLE ANTIBODY, IGG: F-Actin IgG: 3 U (ref 0–19)

## 2023-07-13 LAB — MAGNESIUM: Magnesium: 2.7 mg/dL — ABNORMAL HIGH (ref 1.7–2.4)

## 2023-07-13 LAB — MITOCHONDRIAL ANTIBODIES: Mitochondrial M2 Ab, IgG: <20 U (ref 0.0–20.0)

## 2023-07-13 LAB — IGG: IgG (Immunoglobin G), Serum: 1260 mg/dL (ref 603–1613)

## 2023-07-13 LAB — ALPHA-1-ANTITRYPSIN: A-1 Antitrypsin, Ser: 319 mg/dL — ABNORMAL HIGH (ref 101–187)

## 2023-07-13 MED ORDER — INSULIN GLARGINE-YFGN 100 UNIT/ML ~~LOC~~ SOLN
15.0000 [IU] | Freq: Two times a day (BID) | SUBCUTANEOUS | Status: DC
  Filled 2023-07-13: qty 0.15

## 2023-07-13 MED ORDER — ADULT MULTIVITAMIN W/MINERALS CH: 1.0000 | ORAL_TABLET | Freq: Every day | ORAL | Status: DC

## 2023-07-13 MED ORDER — LACTATED RINGERS IV SOLN: INTRAVENOUS | Status: AC

## 2023-07-13 MED ORDER — HEPARIN (PORCINE) 25000 UT/250ML-% IV SOLN
1600.0000 [IU]/h | INTRAVENOUS | Status: AC
  Administered 2023-07-13: 1400 [IU]/h via INTRAVENOUS
  Filled 2023-07-13: qty 250

## 2023-07-13 MED ORDER — RENA-VITE PO TABS
1.0000 | ORAL_TABLET | Freq: Every day | ORAL | Status: DC
  Administered 2023-07-13 – 2023-07-15 (×3): 1 via ORAL
  Filled 2023-07-13 (×3): qty 1

## 2023-07-13 MED ORDER — GLUCERNA SHAKE PO LIQD
237.0000 mL | Freq: Three times a day (TID) | ORAL | Status: DC
  Administered 2023-07-13 – 2023-07-16 (×4): 237 mL via ORAL

## 2023-07-13 NOTE — Progress Notes (Signed)
PHARMACY - ANTICOAGULATION CONSULT NOTE  Pharmacy Consult for Heparin Indication: atrial fibrillation  Allergies  Allergen Reactions   Penicillins Swelling    Patient Measurements: Height: 6\' 1"  (185.4 cm) Weight: 113.4 kg (250 lb) IBW/kg (Calculated) : 79.9 Heparin Dosing Weight: 104 kg   Vital Signs: Temp: 98.2 F (36.8 C) (12/27 1936) Temp Source: Oral (12/27 1936) BP: 161/67 (12/27 1936) Pulse Rate: 82 (12/27 1936)  Labs: Recent Labs    07/11/23 0504 07/11/23 1640 07/11/23 1640 07/12/23 0508 07/13/23 0529 07/13/23 1428 07/13/23 1937  HGB 8.9*  --   --  8.7* 8.3*  --   --   HCT 28.6*  --   --  27.5* 27.1*  --   --   PLT 350  --   --  351 369  --   --   APTT  --  58*  --  105*  --   --   --   HEPARINUNFRC  --  0.41   < > 0.73*  --  0.21* 0.46  CREATININE 1.65*  --   --  1.83* 1.84*  --   --    < > = values in this interval not displayed.    Estimated Creatinine Clearance: 52.1 mL/min (A) (by C-G formula based on SCr of 1.84 mg/dL (H)).   Medical History: Past Medical History:  Diagnosis Date   Atrial fibrillation [facility]) March 2015   Diabetes mellitus without complication (HCC)    NIDDM, type II   High cholesterol    Hydradenitis    Sebaceous cyst    hx of   Sigmoid diverticulitis    Wears glasses     Medications:  Scheduled:   docusate sodium  100 mg Oral BID   feeding supplement (GLUCERNA SHAKE)  237 mL Oral TID BM   flecainide  50 mg Oral BID   insulin aspart  0-15 Units Subcutaneous Q6H   [START ON 07/14/2023] insulin glargine-yfgn  15 Units Subcutaneous BID   metoprolol tartrate  25 mg Oral BID   multivitamin  1 tablet Oral QHS   pantoprazole  40 mg Oral Daily   sertraline  100 mg Oral Daily   Infusions:   cefTRIAXone (ROCEPHIN)  IV 2 g (07/13/23 1328)   heparin 1,600 Units/hr (07/13/23 1555)   lactated ringers 50 mL/hr at 07/13/23 1600   metronidazole Stopped (07/13/23 1530)   PRN: acetaminophen, albuterol, bisacodyl, dicyclomine,  hydrALAZINE, HYDROmorphone (DILAUDID) injection, methocarbamol **OR** [DISCONTINUED] methocarbamol (ROBAXIN) injection, metoCLOPramide **OR** [DISCONTINUED] metoCLOPramide (REGLAN) injection, naLOXone (NARCAN)  injection, [DISCONTINUED] ondansetron **OR** ondansetron (ZOFRAN) IV, oxyCODONE, polyethylene glycol  Assessment: 67 y.o. male with medical history significant of paroxysmal A-fib status post DCCV in 2021 on Xarelto PTA, history of first and second-degree AV block not requiring pacemaker placement, and atrial tachycardia. Pharmacy was consulted for heparin dosing without a bolus. Patient has possibility of acute cholecystitis and per GI, possible procedure coming up so holding Xarelto currently. Patient's last dose of Xarelto was on 12/23.   12/27 AM: Per ERCP op note from yesterday, OK to resume heparin without bolus.   PM update: heparin level 0.46 (therapeutic) on heparin 1600 units/hr. No issues with the infusion or bleeding reported.  Goal of Therapy:  Heparin level 0.3-0.7 units/ml aPTT 66-102 Monitor platelets by anticoagulation protocol: Yes   Plan:  - continue heparin at 1600 units/hour  - hold heparin at midnight per [facility] and await [facility] clearance to resume   Loralee Pacas, PharmD, BCPS Clinical Pharmacist  07/13/2023 8:24  PM

## 2023-07-13 NOTE — Progress Notes (Signed)
PROGRESS NOTE                                                                                                                                                                                                             Patient Demographics:    Leroy Chen, is a 67 y.o. male, DOB - 1956/03/11, ZOX:[id number]  Outpatient Primary MD for the patient is [name]    LOS - 10  Admit date - 07/02/2023    Chief Complaint  Patient presents with   Leg Pain       Brief Narrative (HPI from H&P)    67 y.o. male with medical history significant of paroxysmal A-fib status post DCCV in 2021 on Xarelto, history of first and second-degree AV block not requiring pacemaker placement, atrial tachycardia, insulin-dependent type 2 diabetes, CKD stage IIIa, hypertension, hyperlipidemia, former smoker, obesity, hidradenitis suppurativa, depression.  Patient was recently seen in the ED on 06/27/23 for fall and right leg injury.  X-ray of the femur showed no evidence of fracture.  His pain was felt to be related to right quadriceps tendon injury.  He was made nonweightbearing on crutches, knee immobilizer placed, and was discharged with plan for orthopedic follow-up.   Continue to have swelling of his right leg with right thigh pain and discomfort came to the ER where CT scan showed very large amount of fluid collection in his right lower extremity in the thigh area, he was admitted for further care.  Further workup suggest that he might have a very large hematoma in his right thigh due to being on Xarelto and having a soft tissue injury.   Subjective:   Patient in bed, appears comfortable, denies any headache, no fever, no chest pain or pressure, no shortness of breath , no abdominal pain. No focal weakness.   Assessment  & Plan :    Right lower extremity soft tissue injury with fall 1 week ago with right lower extremity swelling and  possible secondary cellulitis and foreign body did on CT scan.  He was diagnosed with severe sepsis at the time of admission as well.  I think patient's main issue is right thigh soft tissue hematoma which he incurred due to his severe fall 1 week prior to admission in the setting of him being on Xarelto, CT scan has been noted.  He is already been placed on antibiotics for possible secondary cellulitis and sepsis this has been clinically ruled out.  Orthopedics was consulted, MRI of the right leg obtained with large quadriceps tear and patellar injury and soft tissue hematoma, case discussed with Dr. Lajoyce Corners on 07/05/2023, stopped antibiotics as this is not sepsis or infection, he underwent surgical correction on 07/07/2023 by Dr. Lajoyce Corners of quadriceps tendon repair.  Tolerated the procedure well.  Per Ortho. Patient may be weightbearing as tolerated with the knee immobilizer. I will follow-up with him in 2 weeks in the office. He can discharge to home as soon as he is safe with therapy.  Initiate PT OT, prophylactic dose heparin on 07/07/2023 if tolerates well than home dose Xarelto on 07/08/2023 and discharge home with home PT OT as soon as his strength has improved.   Acute right upper quadrant abdominal pain starting night of 07/09/2023.  LFTs and total bilirubin are high, question if he has a CBD stone or has passed a CBD stone, general surgery and GI were consulted.  Placed on empiric antibiotics.  Symptoms have resolved thankfully.  He underwent MRCP, HIDA scan and ERCP as below.  HIDA scan suspicious for cholecystitis, underwent ERCP where there was CBD sludge it was removed and sphincterotomy was done, LFTs have improved, case discussed with GI and general surgery on 07/13/2023 he will likely will have cholecystectomy done on 07/14/2023.  Continue antibiotics and monitor.  Clinically improving.  Sinus tachycardia - Due to #1 above supportive care and pain control. Improved.   Normocytic anemia -  Hemoglobin currently 11.1, was 16.5 in July 2024.  Patient is chronically anticoagulated although denies any symptoms of GI bleed.  Acute anemia is clearly due to soft tissue hematoma and blood loss in the right lower extremity.  Monitor.  Type screen also done.  No signs of GI bleed continue PPI.  Continue to monitor.  Will stop his Xarelto and place him on heparin for now.   AKI on CKD stage IIIa - Likely prerenal in etiology in the setting of blood loss, improving with IV fluids continue, renal ultrasound nonacute.  Paroxysmal A-fib status post DCCV in 2021 - History of atrial tachycardia: Rate is controlled  - Continue  metoprolol, and flecainide.  Xarelto/heparin drip.  Hypertension  Blood pressure only on the soft side continue to hold ARB, reduce beta-blocker and hydrate on 07/08/2023.   Hyperlipidemia hold Crestor until his LFTs stabilize.   Depression  Continue Zoloft.   GERD  Continue Protonix.  Mild hyponatremia  Gentle hydration   High anion gap metabolic acidosis  - he takes Jardiance and had developed euglycemic DKA which has resolved after insulin drip, transition to subcu insulin and sliding scale, discontinue Jardiance permanently upon discharge.    Weakness and deconditioning.  PT OT, ambulate in the hallway, sit in chair in the daytime, prepare for home discharge on 07/10/2023 if he does well with PT.  He still feels extremely weak overall.    Insulin-dependent type 2 diabetes -on long-acting insulin twice a day along with sliding scale, dose adjusted while he is n.p.o. on 07/10/2023.   Lab Results  Component Value Date   HGBA1C 7.6 (H) 07/03/2023   CBG (last 3)  Recent Labs    07/12/23 2148 07/13/23 0035 07/13/23 0528  GLUCAP 216* 199* 190*         Condition - Fair  Family Communication  : Wife [phone]  on 07/04/2023 at 7:53 AM, 07/08/2023, 07/11/2023  Code  Status :  Full  Consults  :  Ortho, GI, general surgery  PUD Prophylaxis : PPI    Procedures  :     Renal ultrasound.  Non acute  ERCP -  Impression:               - The examination was suspicious for CBD sludge.                           - The common bile duct was mildly dilated,                            uncertain significance.                           - A biliary sphincterotomy was performed.                           - The biliary tree was swept and sludge was found. Recommendation:           - Return patient to hospital ward for ongoing care.                           - Observe patient's clinical course following                            today's ERCP with therapeutic intervention.                           - Trend LFTs.                           - OK to resume IV heparin without a bolus after                            2000 today.                           - General surgery to review. Imaging is concerning                            for acute cholecystitis however his acute abdominal                            pain has resolved.  HIDA scan.  1. Non filling of the gallbladder even following morphine administration. Appearance favors obstructed cystic duct and acute cholecystitis. 2. Patent common bile duct. 3. Prolonged blood pool activity raising suspicion for mild hepatocellular dysfunction.  MRCP.  1. Small volume of layering sludge in the gallbladder. Severe gallbladder wall thickening and trace pericholecystic fluid. Findings are consistent with acute cholecystitis. 2. Heterogeneously rim enhancing lesion in the posterior right lobe of the liver measuring 2.2 x 1.4 cm, with adjacent small heterogeneously enhancing areas of T2 hyperintensity posteriorly. Findings are most consistent with hepatic abscess in this clinical setting. Malignancy remains a differential consideration and at minimum, recommend follow-up imaging in 3 months following acute clinical presentation to ensure complete resolution. 3. Central common bile duct is dilated up  to 1.0 cm in the  pancreatic head, however the duct tapers smoothly to the ampulla without calculus or other evidence of obstruction. Aortic Atherosclerosis (ICD10-I70.0)  CT Abd - Pelvis - 1. Small volume of layering sludge in the gallbladder. Severe gallbladder wall thickening and trace pericholecystic fluid. Findings are consistent with acute cholecystitis. 2. Heterogeneously rim enhancing lesion in the posterior right lobe of the liver measuring 2.2 x 1.4 cm, with adjacent small heterogeneously enhancing areas of T2 hyperintensity posteriorly. Findings are most consistent with hepatic abscess in this clinical setting. Malignancy remains a differential consideration and at minimum, recommend follow-up imaging in 3 months following acute clinical presentation to ensure complete resolution. 3. Central common bile duct is dilated up to 1.0 cm in the pancreatic head, however the duct tapers smoothly to the ampulla without calculus or other evidence of obstruction. Aortic Atherosclerosis (ICD10-I70.0).   MRI.  Right lower extremity.    1. High-grade partial tear of the distal quadriceps tendon, involving the superficial central fibers arising from the rectus femoris tendon as well as some of the medial fibers arising from the vastus medialis. The deep central and lateral fibers appear intact. 2. Large amount of heterogeneous soft tissue swelling anteriorly in the distal thigh, likely representing soft tissue hematoma. 3. Possible partial tear of the medial patellofemoral ligament at its patellar attachment. 4. The menisci, cruciate and collateral ligaments appear intact. 5. No evidence of acute fracture or osteonecrosis  Lower extremity venous duplex.  No DVT.    CT right hip.  No fracture.    CT scan tibia-fibula right leg.   1. Subcutaneous edema throughout the mid and lower thigh surrounding the knee. No evidence for soft tissue gas or fluid collection. 2. No acute osseous abnormality. 3. Thin linear radiopaque foreign  body measuring 1 cm within the lateral subcutaneous tissues of the distal right lower extremity.      Disposition Plan  :    Status is: Inpatient   DVT Prophylaxis  :    SCDs Start: 07/06/23 1801 SCDs Start: 07/04/23 0813 Place and maintain sequential compression device Start: 07/04/23 0718   Lab Results  Component Value Date   PLT 369 07/13/2023    Diet :  Diet Order             Diet NPO time specified Except for: Sips with Meds  Diet effective midnight           Diet clear liquid Room service appropriate? No; Fluid consistency: Thin  Diet effective now                    Inpatient Medications  Scheduled Meds:  docusate sodium  100 mg Oral BID   flecainide  50 mg Oral BID   insulin aspart  0-15 Units Subcutaneous Q6H   insulin glargine-yfgn  15 Units Subcutaneous BID   metoprolol tartrate  25 mg Oral BID   pantoprazole  40 mg Oral Daily   sertraline  100 mg Oral Daily   Continuous Infusions:  cefTRIAXone (ROCEPHIN)  IV 2 g (07/12/23 1505)   heparin 1,400 Units/hr (07/13/23 0839)   lactated ringers 50 mL/hr at 07/13/23 0714   metronidazole 500 mg (07/13/23 0047)     PRN Meds:.acetaminophen, albuterol, bisacodyl, dicyclomine, hydrALAZINE, HYDROmorphone (DILAUDID) injection, methocarbamol **OR** [DISCONTINUED] methocarbamol (ROBAXIN) injection, metoCLOPramide **OR** [DISCONTINUED] metoCLOPramide (REGLAN) injection, naLOXone (NARCAN)  injection, [DISCONTINUED] ondansetron **OR** ondansetron (ZOFRAN) IV, oxyCODONE, polyethylene glycol    Objective:   Vitals:   07/13/23  0100 07/13/23 0330 07/13/23 0400 07/13/23 0800  BP:   (!) 147/59 (!) 162/63  Pulse:  83 83 82  Resp:  20 19 (!) 22  Temp: 98.1 F (36.7 C)  98 F (36.7 C) 97.8 F (36.6 C)  TempSrc: Oral  Oral Oral  SpO2:  95% 95% 96%  Weight:      Height:        Wt Readings from Last 3 Encounters:  07/02/23 113.4 kg  06/27/23 113.4 kg  02/12/23 113 kg     Intake/Output Summary (Last 24  hours) at 07/13/2023 1111 Last data filed at 07/13/2023 0500 Gross per 24 hour  Intake 400 ml  Output 1850 ml  Net -1450 ml     Physical Exam  Awake Alert, No new F.N deficits, Normal affect .AT,PERRAL Supple Neck, No JVD,   Symmetrical Chest wall movement, Good air movement bilaterally, CTAB RRR,No Gallops, Rubs or new Murmurs,  +ve B.Sounds, Abd Soft, minimal right upper quadrant tenderness Right knee and leg in knee immobilizer   Data Review:    Recent Labs  Lab 07/09/23 0444 07/10/23 0757 07/11/23 0504 07/12/23 0508 07/13/23 0529  WBC 7.1 7.9 8.3 7.6 8.8  HGB 8.0* 9.2* 8.9* 8.7* 8.3*  HCT 25.2* 28.7* 28.6* 27.5* 27.1*  PLT 311 346 350 351 369  MCV 96.6 94.7 95.7 95.8 94.8  MCH 30.7 30.4 29.8 30.3 29.0  MCHC 31.7 32.1 31.1 31.6 30.6  RDW 15.1 15.2 15.8* 16.4* 16.6*  LYMPHSABS 1.1 0.4* 1.0 1.2 0.9  MONOABS 1.4* 1.1* 1.3* 1.1* 0.9  EOSABS 0.1 0.0 0.1 0.2 0.0  BASOSABS 0.0 0.0 0.1 0.1 0.0    Recent Labs  Lab 07/07/23 0500 07/08/23 0439 07/09/23 0444 07/10/23 0757 07/10/23 1014 07/10/23 1504 07/11/23 0504 07/12/23 0508 07/13/23 0529  NA 138 138 138 134*  --   --  139 137 136  K 4.7 4.8 3.6 4.1  --   --  3.8 4.1 4.2  CL 105 106 106 102  --   --  104 104 106  CO2 22 25 25  18*  --   --  21* 21* 19*  ANIONGAP 11 7 7 14   --   --  14 12 11   GLUCOSE 157* 162* 142* 239*  --   --  178* 154* 199*  BUN 18 19 17 15   --   --  14 16 24*  CREATININE 1.21 1.84* 1.48* 1.49*  --   --  1.65* 1.83* 1.84*  AST  --   --   --  407*  --   --  1,023* 1,002* 330*  ALT  --   --   --  212*  --   --  565* 706* 482*  ALKPHOS  --   --   --  271*  --   --  376* 558* 563*  BILITOT  --   --   --  13.2*  --  14.9* 18.3* 18.6* 9.1*  ALBUMIN  --   --   --  2.7*  --   --  2.4* 2.4* 2.8*  CRP 17.7* 24.6*  --   --  18.5*  --  17.8* 19.9* 16.9*  PROCALCITON 0.14 0.47 0.35  --  0.50  --  0.70 0.60 2.71  INR  --   --   --   --   --  2.2*  --   --   --   BNP 74.8 90.7  --   --   --   --    --   --   --  MG 2.2 2.2  --   --   --   --  2.3 2.5* 2.7*  CALCIUM 8.6* 8.6* 8.4* 8.6*  --   --  8.9 8.6* 8.7*      Recent Labs  Lab 07/07/23 0500 07/08/23 0439 07/09/23 0444 07/10/23 0757 07/10/23 1014 07/10/23 1504 07/11/23 0504 07/12/23 0508 07/13/23 0529  CRP 17.7* 24.6*  --   --  18.5*  --  17.8* 19.9* 16.9*  PROCALCITON 0.14 0.47 0.35  --  0.50  --  0.70 0.60 2.71  INR  --   --   --   --   --  2.2*  --   --   --   BNP 74.8 90.7  --   --   --   --   --   --   --   MG 2.2 2.2  --   --   --   --  2.3 2.5* 2.7*  CALCIUM 8.6* 8.6* 8.4* 8.6*  --   --  8.9 8.6* 8.7*    --------------------------------------------------------------------------------------------------------------- No results found for: "CHOL", "HDL", "LDLCALC", "LDLDIRECT", "TRIG", "CHOLHDL"  Lab Results  Component Value Date   HGBA1C 7.6 (H) 07/03/2023     Micro Results Recent Results (from the past 240 hours)  MRSA Next Gen by PCR, Nasal     Status: None   Collection Time: 07/04/23 11:24 AM   Specimen: Nasal Mucosa; Nasal Swab  Result Value Ref Range Status   MRSA by PCR Next Gen NOT DETECTED NOT DETECTED Final    Comment: (NOTE) The GeneXpert MRSA Assay (FDA approved for NASAL specimens only), is one component of a comprehensive MRSA colonization surveillance program. It is not intended to diagnose MRSA infection nor to guide or monitor treatment for MRSA infections. Test performance is not FDA approved in patients less than 68 years old. Performed at [facility], [address] [place], [place] [address]   Surgical PCR screen     Status: None   Collection Time: 07/06/23  8:23 AM   Specimen: Nasal Mucosa; Nasal Swab  Result Value Ref Range Status   MRSA, PCR NEGATIVE NEGATIVE Final   Staphylococcus aureus NEGATIVE NEGATIVE Final    Comment: (NOTE) The Xpert SA Assay (FDA approved for NASAL specimens in patients 52 years of age and older), is one component of a  comprehensive surveillance program. It is not intended to diagnose infection nor to guide or monitor treatment. Performed at [facility], [address] [place], [place] [address]     Radiology Reports US RENAL Result Date: 07/12/2023 CLINICAL DATA:  Acute kidney injury EXAM: RENAL / URINARY TRACT ULTRASOUND COMPLETE COMPARISON:  CT 07/10/2023 FINDINGS: Right Kidney: Renal measurements: 10.4 x 5.8 x 4.8 cm = volume: 152 mL. Echogenicity within normal limits. No mass or hydronephrosis visualized. Left Kidney: Renal measurements: 11.3 x 5.6 x 4.9 cm = volume: 163 mL. Echogenicity within normal limits. No mass or hydronephrosis visualized. Bladder: Appears normal for degree of bladder distention. Other: Penile prosthesis noted to the left of the bladder. IMPRESSION: No acute findings. Electronically Signed   By: Charlett Nose M.D.   On: 07/12/2023 23:21   DG ERCP Result Date: 07/12/2023 CLINICAL DATA:  Choledocholithiasis EXAM: ERCP TECHNIQUE: Multiple spot images obtained with the fluoroscopic device and submitted for interpretation post-procedure. FLUOROSCOPY: Radiation Exposure Index (as provided by the fluoroscopic device): 38.83 mGy Kerma COMPARISON:  MRCP 07/10/2023 FINDINGS: A total of 25 saved images are submitted for review.  The images demonstrate a flexible duodenal scope in the descending duodenum with wire cannulation of the common bile duct. Cholangiogram demonstrates nonocclusive filling defect in the distal common bile duct. Subsequent images confirm sphincterotomy and balloon sweeping of the common duct. IMPRESSION: 1. Choledocholithiasis. 2. ERCP with sphincterotomy and balloon sweeping of the common duct. These images were submitted for radiologic interpretation only. Please see the procedural report for the amount of contrast and the fluoroscopy time utilized. Electronically Signed   By: Malachy Moan M.D.   On: 07/12/2023 16:53   NM Hepatobiliary Liver Func Result Date:  07/12/2023 CLINICAL DATA:  Right upper quadrant abdominal pain EXAM: NUCLEAR MEDICINE HEPATOBILIARY IMAGING TECHNIQUE: Sequential images of the abdomen were obtained out to 60 minutes following intravenous administration of radiopharmaceutical. RADIOPHARMACEUTICALS:  9.8 mCi Tc-70m  Choletec IV (initial) 1.5 millicurie Tc 99 Choletec (reinjection immediately prior to morphine administration) 3.0 mg IV morphine COMPARISON:  MRI 09/09/2022 FINDINGS: Prolonged blood pool activity may suggest some degree of hepatocellular dysfunction. Biliary activity is visible by 10 minutes with bowel activity at 25 minutes. Over the initial 60 minutes, gallbladder activity was not observed. Subsequently, 3.0 mg of IV morphine was administered to the patient. After this, imaging over 30 minutes demonstrated continued bowel activity but no gallbladder activity. IMPRESSION: 1. Non filling of the gallbladder even following morphine administration. Appearance favors obstructed cystic duct and acute cholecystitis. 2. Patent common bile duct. 3. Prolonged blood pool activity raising suspicion for mild hepatocellular dysfunction. Electronically Signed   By: Gaylyn Rong M.D.   On: 07/12/2023 12:36   MR 3D Recon At Scanner Result Date: 07/10/2023 : CLINICAL DATA: Jaundice, right upper quadrant abdominal pain, gallstones, indeterminate liver lesion EXAM: MRI ABDOMEN WITHOUT AND WITH CONTRAST (INCLUDING MRCP) TECHNIQUE: Multiplanar multisequence MR imaging of the abdomen was performed both before and after the administration of intravenous contrast. Heavily T2-weighted images of the biliary and pancreatic ducts were obtained, and three-dimensional MRCP images were rendered by post processing. CONTRAST: 10mL GADAVIST GADOBUTROL 1 MMOL/ML IV SOLN COMPARISON: Same-day CT abdomen pelvis CT abdomen pelvis, 11/10/2020 FINDINGS: Lower chest: No acute abnormality. Hepatobiliary: Heterogeneously rim enhancing lesion in the posterior right  lobe of the liver measuring 2.2 x 1.4 cm (series 1401, image 77), with adjacent small heterogeneously enhancing areas of T2 hyperintensity posteriorly. Small volume of layering sludge in the gallbladder (series 8, image 20). Severe gallbladder wall thickening and trace pericholecystic fluid. Central common bile duct is dilated up to 1.0 cm in the pancreatic head, however the duct tapers smoothly to the ampulla without calculus or other evidence of obstruction (series 2, image 18). Pancreas: Unremarkable. No pancreatic ductal dilatation or surrounding inflammatory changes. Spleen: Normal in size without significant abnormality. Adrenals/Urinary Tract: Adrenal glands are unremarkable. Kidneys are normal, without renal calculi, solid lesion, or hydronephrosis. Stomach/Bowel: Stomach is within normal limits. No evidence of bowel wall thickening, distention, or inflammatory changes. Vascular/Lymphatic: Aortic atherosclerosis. No enlarged abdominal lymph nodes. Other: No abdominal wall hernia or abnormality. No ascites. Musculoskeletal: No acute or significant osseous findings. IMPRESSION: 1. Small volume of layering sludge in the gallbladder. Severe gallbladder wall thickening and trace pericholecystic fluid. Findings are consistent with acute cholecystitis. 2. Heterogeneously rim enhancing lesion in the posterior right lobe of the liver measuring 2.2 x 1.4 cm, with adjacent small heterogeneously enhancing areas of T2 hyperintensity posteriorly. Findings are most consistent with hepatic abscess in this clinical setting. Malignancy remains a differential consideration and at minimum, recommend follow-up imaging in 3 months following  acute clinical presentation to ensure complete resolution. 3. Central common bile duct is dilated up to 1.0 cm in the pancreatic head, however the duct tapers smoothly to the ampulla without calculus or other evidence of obstruction. Aortic Atherosclerosis (ICD10-I70.0). Electronically Signed    By: Jearld Lesch M.D.   On: 07/10/2023 19:46   MR ABDOMEN MRCP W WO CONTAST Result Date: 07/10/2023 CLINICAL DATA:  Jaundice, right upper quadrant abdominal pain, gallstones, indeterminate liver lesion EXAM: MRI ABDOMEN WITHOUT AND WITH CONTRAST (INCLUDING MRCP) TECHNIQUE: Multiplanar multisequence MR imaging of the abdomen was performed both before and after the administration of intravenous contrast. Heavily T2-weighted images of the biliary and pancreatic ducts were obtained, and three-dimensional MRCP images were rendered by post processing. CONTRAST:  10mL GADAVIST GADOBUTROL 1 MMOL/ML IV SOLN COMPARISON:  Same-day CT abdomen pelvis CT abdomen pelvis, 11/10/2020 FINDINGS: Lower chest: No acute abnormality. Hepatobiliary: Heterogeneously rim enhancing lesion in the posterior right lobe of the liver measuring 2.2 x 1.4 cm (series 1401, image 77), with adjacent small heterogeneously enhancing areas of T2 hyperintensity posteriorly. Small volume of layering sludge in the gallbladder (series 8, image 20). Severe gallbladder wall thickening and trace pericholecystic fluid. Central common bile duct is dilated up to 1.0 cm in the pancreatic head, however the duct tapers smoothly to the ampulla without calculus or other evidence of obstruction (series 2, image 18). Pancreas: Unremarkable. No pancreatic ductal dilatation or surrounding inflammatory changes. Spleen: Normal in size without significant abnormality. Adrenals/Urinary Tract: Adrenal glands are unremarkable. Kidneys are normal, without renal calculi, solid lesion, or hydronephrosis. Stomach/Bowel: Stomach is within normal limits. No evidence of bowel wall thickening, distention, or inflammatory changes. Vascular/Lymphatic: Aortic atherosclerosis. No enlarged abdominal lymph nodes. Other: No abdominal wall hernia or abnormality. No ascites. Musculoskeletal: No acute or significant osseous findings. IMPRESSION: 1. Small volume of layering sludge in the  gallbladder. Severe gallbladder wall thickening and trace pericholecystic fluid. Findings are consistent with acute cholecystitis. 2. Heterogeneously rim enhancing lesion in the posterior right lobe of the liver measuring 2.2 x 1.4 cm, with adjacent small heterogeneously enhancing areas of T2 hyperintensity posteriorly. Findings are most consistent with hepatic abscess in this clinical setting. Malignancy remains a differential consideration and at minimum, recommend follow-up imaging in 3 months following acute clinical presentation to ensure complete resolution. 3. Central common bile duct is dilated up to 1.0 cm in the pancreatic head, however the duct tapers smoothly to the ampulla without calculus or other evidence of obstruction. Aortic Atherosclerosis (ICD10-I70.0). Electronically Signed   By: Jearld Lesch M.D.   On: 07/10/2023 19:42   DG Abd Portable 1V Result Date: 07/10/2023 CLINICAL DATA:  Upper abdominal pain. EXAM: PORTABLE ABDOMEN - 1 VIEW COMPARISON:  04/23/2019. FINDINGS: The bowel gas pattern is normal. No radio-opaque calculi or other significant radiographic abnormality are seen. IMPRESSION: Negative. Electronically Signed   By: Layla Maw M.D.   On: 07/10/2023 09:43   CT ABDOMEN PELVIS W CONTRAST Result Date: 07/10/2023 CLINICAL DATA:  Right upper quadrant pain. EXAM: CT ABDOMEN AND PELVIS WITH CONTRAST TECHNIQUE: Multidetector CT imaging of the abdomen and pelvis was performed using the standard protocol following bolus administration of intravenous contrast. RADIATION DOSE REDUCTION: This exam was performed according to the departmental dose-optimization program which includes automated exposure control, adjustment of the mA and/or kV according to patient size and/or use of iterative reconstruction technique. CONTRAST:  75mL OMNIPAQUE IOHEXOL 350 MG/ML SOLN COMPARISON:  11/10/2020 and 10/12/2014. FINDINGS: Lower chest: Linear bibasilar subsegmental atelectasis no  or scarring. No  pleural or pericardial effusion. Coronary atheromatous calcifications. Hepatobiliary: Hepatic lesion right lobe measuring 2.8 cm is indeterminate and new compared to old studies. This lesion can be assessed further with liver protocol CT or MRI. No intrahepatic ductal dilatation. Gallbladder is distended with pericholecystic inflammatory changes and numerous small calcified stones. The appearance is suggestive of acute cholecystitis. This can be confirmed with a HIDA scan, if indicated. Pancreas: Unremarkable. No pancreatic ductal dilatation or surrounding inflammatory changes. Spleen: Normal in size without focal abnormality. Adrenals/Urinary Tract: Right adrenal nodule measuring 2 cm is unchanged. No hydronephrosis or nephrolithiasis. Unremarkable urinary bladder. Stomach/Bowel: No bowel dilatation to suggest obstruction. No inflammatory mucosal process of the bowel to limits of CT evaluation. Diverticula in the descending and sigmoid colon. No evidence of diverticulitis. Vascular/Lymphatic: No significant vascular findings are present. No enlarged abdominal or pelvic lymph nodes. Reproductive: Prominent prostate measures 6.3 x 5.7 x 4.7 cm. Left-sided reservoir for penile prosthesis noted. Other: Small periumbilical abdominal wall hernia containing omental fat without evidence of incarceration or herniated bowel. No abdominopelvic ascites. Musculoskeletal: Degenerative changes with vacuum discs at L1-2-3. IMPRESSION: 1. Findings suggestive of acute cholecystitis. 2. Indeterminate hepatic lesion. This can be assessed further with liver protocol CT or MRI. 3. Stable right adrenal nodule. 4. Diverticulosis without diverticulitis. 5. Prostatomegaly. Electronically Signed   By: Layla Maw M.D.   On: 07/10/2023 09:42       Signature  -   Susa Raring M.D on 07/13/2023 at 11:11 AM   -  To page go to [email or web address]

## 2023-07-13 NOTE — Progress Notes (Signed)
Initial Nutrition Assessment  DOCUMENTATION CODES:   Obesity unspecified  INTERVENTION:  - Glucerna Shake po TID, each supplement provides 220 kcal and 10 grams of protein - Renavite daily  - Encourage good PO intake  - x 2 proteins with meals - Diet advancement per MD  - Recommend updated weight   NUTRITION DIAGNOSIS:   Inadequate oral intake related to acute illness as evidenced by other (comment) (NPO/CL status and being on/off a diet).   GOAL:   Patient will meet greater than or equal to 90% of their needs   MONITOR:   PO intake, Supplement acceptance, Diet advancement, Labs, Weight trends  REASON FOR ASSESSMENT:   NPO/Clear Liquid Diet    ASSESSMENT:   67 y.o Male with PMH of T2DM, diverticulitis, A-fib s/p DCCV in 2021 on Xarelto , CKD 3a, HTN, HLD, obesity, depression, hidradenitis suppurativa. Admitted 12/11 for fall on rihgt leg injury related to right quadriceps tendon injury and discharged. Pt presented 12/16 for right hip pain, found to have severe sepsis secondary to cellulitis or right lower leg and underwent R quadriceps repair. During admission pt acquired upper abdominal pain and diagnosed with acute cholecystitis.  12/20 - Surgery for right quadriceps tendon repair 12/28 - Planned for cholecystectomy   Pt endorses good app/PO intake PTA and was eating 2 meals per day consisting of a protein, starch and vegetable. Sometimes would eat out and occasionally snacked on Popcorn. Pt has been off and on a diet since admission. The longest he has been on a diet was from 12/20 to 12/24. Pt does not remember much of what he had to eat during this time but he states he was not eating well and had no appetite.  He endorses no weight loss and states his baseline is around 250 lbs. Messaged RN to get updated weight. He was placed on a soft diet this afternoon and he reports eating most of his lunch. Pt to be NPO again tonight for surgery tomorrow.  No abdominal pain/ or  N/V noted.   After surgery is pt is not advanced to at least full liquids next week, recommend nutrition support.    Admit weight: 113.4 kg  Current weight: 113.4 kg    Average Meal Intake: 12/22: 66% intake x 3 recorded meals  Nutritionally Relevant Medications: Scheduled Meds:  docusate sodium  100 mg Oral BID   feeding supplement (GLUCERNA SHAKE)  237 mL Oral TID BM   flecainide  50 mg Oral BID   insulin aspart  0-15 Units Subcutaneous Q6H   [START ON 07/14/2023] insulin glargine-yfgn  15 Units Subcutaneous BID   metoprolol tartrate  25 mg Oral BID   multivitamin with minerals  1 tablet Oral Daily   pantoprazole  40 mg Oral Daily   sertraline  100 mg Oral Daily   Continuous Infusions:  cefTRIAXone (ROCEPHIN)  IV 2 g (07/13/23 1328)   heparin 1,400 Units/hr (07/13/23 0839)   lactated ringers 50 mL/hr at 07/13/23 0981   metronidazole 500 mg (07/13/23 1425)   Labs Reviewed: BUN 24, Creatinine 1.84, Calcium 8.7, Magnesium 2.7 CBG ranges from 140-232 mg/dL over the last 24 hours HgbA1c 7.6   NUTRITION - FOCUSED PHYSICAL EXAM:  Flowsheet Row Most Recent Value  Orbital Region No depletion  Upper Arm Region Mild depletion  Thoracic and Lumbar Region No depletion  Buccal Region No depletion  Temple Region Mild depletion  Clavicle Bone Region Mild depletion  Clavicle and Acromion Bone Region Mild depletion  Scapular  Bone Region Mild depletion  Dorsal Hand No depletion  Patellar Region Mild depletion  Anterior Thigh Region Mild depletion  Posterior Calf Region Mild depletion  Edema (RD Assessment) None  Hair Reviewed  Eyes Reviewed  Mouth Reviewed  Skin Reviewed  Nails Reviewed       Diet Order:   Diet Order             Diet NPO time specified Except for: Sips with Meds  Diet effective midnight           DIET SOFT Room service appropriate? Yes; Fluid consistency: Thin  Diet effective now                   EDUCATION NEEDS:   Education needs have  been addressed  Skin:  Skin Assessment: Skin Integrity Issues: Skin Integrity Issues:: Incisions Incisions: R thigh  Last BM:  07/11/2023  Height:   Ht Readings from Last 1 Encounters:  07/02/23 6\' 1"  (1.854 m)    Weight:   Wt Readings from Last 1 Encounters:  07/02/23 113.4 kg    Ideal Body Weight:  83.64 kg  BMI:  Body mass index is 32.98 kg/m.  Estimated Nutritional Needs:   Kcal:  2000-2200 kcal  Protein:  120-140 gm  Fluid:  >2L  Elliot Dally, RD Registered Dietitian  See Amion for more information

## 2023-07-13 NOTE — Progress Notes (Addendum)
PHARMACY - ANTICOAGULATION CONSULT NOTE  Pharmacy Consult for Heparin Indication: atrial fibrillation  Allergies  Allergen Reactions   Penicillins Swelling    Patient Measurements: Height: 6\' 1"  (185.4 cm) Weight: 113.4 kg (250 lb) IBW/kg (Calculated) : 79.9 Heparin Dosing Weight: 104 kg   Vital Signs: Temp: 98 F (36.7 C) (12/27 0400) Temp Source: Oral (12/27 0400) BP: 147/59 (12/27 0400) Pulse Rate: 83 (12/27 0400)  Labs: Recent Labs    07/10/23 1504 07/11/23 0504 07/11/23 1640 07/12/23 0508 07/13/23 0529  HGB  --  8.9*  --  8.7* 8.3*  HCT  --  28.6*  --  27.5* 27.1*  PLT  --  350  --  351 369  APTT  --   --  58* 105*  --   LABPROT 25.0*  --   --   --   --   INR 2.2*  --   --   --   --   HEPARINUNFRC  --   --  0.41 0.73*  --   CREATININE  --  1.65*  --  1.83* 1.84*    Estimated Creatinine Clearance: 52.1 mL/min (A) (by C-G formula based on SCr of 1.84 mg/dL (H)).   Medical History: Past Medical History:  Diagnosis Date   Atrial fibrillation [facility]) March 2015   Diabetes mellitus without complication (HCC)    NIDDM, type II   High cholesterol    Hydradenitis    Sebaceous cyst    hx of   Sigmoid diverticulitis    Wears glasses     Medications:  Scheduled:   docusate sodium  100 mg Oral BID   flecainide  50 mg Oral BID   insulin aspart  0-15 Units Subcutaneous Q6H   insulin glargine-yfgn  15 Units Subcutaneous BID   metoprolol tartrate  25 mg Oral BID   pantoprazole  40 mg Oral Daily   sertraline  100 mg Oral Daily   Infusions:   cefTRIAXone (ROCEPHIN)  IV 2 g (07/12/23 1505)   lactated ringers 50 mL/hr at 07/13/23 0714   metronidazole 500 mg (07/13/23 0047)   PRN: acetaminophen, albuterol, bisacodyl, dicyclomine, hydrALAZINE, HYDROmorphone (DILAUDID) injection, methocarbamol **OR** [DISCONTINUED] methocarbamol (ROBAXIN) injection, metoCLOPramide **OR** [DISCONTINUED] metoCLOPramide (REGLAN) injection, naLOXone (NARCAN)  injection, [DISCONTINUED]  ondansetron **OR** ondansetron (ZOFRAN) IV, oxyCODONE, polyethylene glycol  Assessment: 67 y.o. male with medical history significant of paroxysmal A-fib status post DCCV in 2021 on Xarelto PTA, history of first and second-degree AV block not requiring pacemaker placement, and atrial tachycardia. Pharmacy was consulted for heparin dosing without a bolus. Patient has possibility of acute cholecystitis and per GI, possible procedure coming up so holding Xarelto currently. Patient's last dose of Xarelto was on 12/23.   Will check a heparin level and aPTT for a baseline and correlation assessment since patient was on a DOAC.   12/27 AM: Per ERCP op note from yesterday, OK to resume heparin without bolus.    Goal of Therapy:  Heparin level 0.3-0.7 units/ml aPTT 66-102 Monitor platelets by anticoagulation protocol: Yes   Plan:  Restart heparin at 1400 units/hour  Check 6-hour heparin level   ADDENDUM: Heparin started at previously therapeutic rate this AM of 1400 units/hour. Heparin level returned subtherapeutic at 0.21. No signs of bleeding or issues with the heparin infusion noted.   - Increase heparin to 1600 units/hour - hold heparin at midnight per CCS and await CCS clearance to resume  - F/U 6 hour heparin level  - F/U to resume Xarelto  Jani Gravel, PharmD Clinical Pharmacist  07/13/2023 7:32 AM

## 2023-07-13 NOTE — Progress Notes (Signed)
Progress Note   Subjective: Underwent ERCP yesterday with extraction of sludge from the CBD. Tbili down significantly today to 9.1. Transaminases also down significantly. Patient denies any abdominal pain.  Objective: Vital signs in last 24 hours: Temp:  [97.8 F (36.6 C)-98.3 F (36.8 C)] 97.8 F (36.6 C) (12/27 0800) Pulse Rate:  [82-111] 82 (12/27 0800) Resp:  [18-33] 22 (12/27 0800) BP: (142-178)/(59-88) 162/63 (12/27 0800) SpO2:  [95 %-97 %] 96 % (12/27 0800) Last BM Date : 07/11/23  Intake/Output from previous day: 12/26 0701 - 12/27 0700 In: 400 [I.V.:400] Out: 1850 [Urine:1850] Intake/Output this shift: No intake/output data recorded.  PE: General: NAD, resting in bed Neuro: alert and oriented HEENT: sclera icteric  Lungs:  Respiratory effort nonlabored Abd: soft, NT, ND. Well-healed lower midline surgical scar.     Lab Results:  Recent Labs    07/12/23 0508 07/13/23 0529  WBC 7.6 8.8  HGB 8.7* 8.3*  HCT 27.5* 27.1*  PLT 351 369   BMET Recent Labs    07/12/23 0508 07/13/23 0529  NA 137 136  K 4.1 4.2  CL 104 106  CO2 21* 19*  GLUCOSE 154* 199*  BUN 16 24*  CREATININE 1.83* 1.84*  CALCIUM 8.6* 8.7*   PT/INR Recent Labs    07/10/23 1504  LABPROT 25.0*  INR 2.2*   CMP     Component Value Date/Time   NA 136 07/13/2023 0529   K 4.2 07/13/2023 0529   CL 106 07/13/2023 0529   CO2 19 (L) 07/13/2023 0529   GLUCOSE 199 (H) 07/13/2023 0529   BUN 24 (H) 07/13/2023 0529   CREATININE 1.84 (H) 07/13/2023 0529   CALCIUM 8.7 (L) 07/13/2023 0529   PROT 7.3 07/13/2023 0529   ALBUMIN 2.8 (L) 07/13/2023 0529   AST 330 (H) 07/13/2023 0529   ALT 482 (H) 07/13/2023 0529   ALKPHOS 563 (H) 07/13/2023 0529   BILITOT 9.1 (H) 07/13/2023 0529   GFRNONAA 40 (L) 07/13/2023 0529   GFRAA >60 04/28/2019 0914   Lipase     Component Value Date/Time   LIPASE 90 (H) 07/10/2023 0757       Studies/Results: US RENAL Result Date: 07/12/2023 CLINICAL  DATA:  Acute kidney injury EXAM: RENAL / URINARY TRACT ULTRASOUND COMPLETE COMPARISON:  CT 07/10/2023 FINDINGS: Right Kidney: Renal measurements: 10.4 x 5.8 x 4.8 cm = volume: 152 mL. Echogenicity within normal limits. No mass or hydronephrosis visualized. Left Kidney: Renal measurements: 11.3 x 5.6 x 4.9 cm = volume: 163 mL. Echogenicity within normal limits. No mass or hydronephrosis visualized. Bladder: Appears normal for degree of bladder distention. Other: Penile prosthesis noted to the left of the bladder. IMPRESSION: No acute findings. Electronically Signed   By: Charlett Nose M.D.   On: 07/12/2023 23:21   DG ERCP Result Date: 07/12/2023 CLINICAL DATA:  Choledocholithiasis EXAM: ERCP TECHNIQUE: Multiple spot images obtained with the fluoroscopic device and submitted for interpretation post-procedure. FLUOROSCOPY: Radiation Exposure Index (as provided by the fluoroscopic device): 38.83 mGy Kerma COMPARISON:  MRCP 07/10/2023 FINDINGS: A total of 25 saved images are submitted for review. The images demonstrate a flexible duodenal scope in the descending duodenum with wire cannulation of the common bile duct. Cholangiogram demonstrates nonocclusive filling defect in the distal common bile duct. Subsequent images confirm sphincterotomy and balloon sweeping of the common duct. IMPRESSION: 1. Choledocholithiasis. 2. ERCP with sphincterotomy and balloon sweeping of the common duct. These images were submitted for radiologic interpretation only. Please see the procedural report  for the amount of contrast and the fluoroscopy time utilized. Electronically Signed   By: Malachy Moan M.D.   On: 07/12/2023 16:53   NM Hepatobiliary Liver Func Result Date: 07/12/2023 CLINICAL DATA:  Right upper quadrant abdominal pain EXAM: NUCLEAR MEDICINE HEPATOBILIARY IMAGING TECHNIQUE: Sequential images of the abdomen were obtained out to 60 minutes following intravenous administration of radiopharmaceutical.  RADIOPHARMACEUTICALS:  9.8 mCi Tc-16m  Choletec IV (initial) 1.5 millicurie Tc 99 Choletec (reinjection immediately prior to morphine administration) 3.0 mg IV morphine COMPARISON:  MRI 09/09/2022 FINDINGS: Prolonged blood pool activity may suggest some degree of hepatocellular dysfunction. Biliary activity is visible by 10 minutes with bowel activity at 25 minutes. Over the initial 60 minutes, gallbladder activity was not observed. Subsequently, 3.0 mg of IV morphine was administered to the patient. After this, imaging over 30 minutes demonstrated continued bowel activity but no gallbladder activity. IMPRESSION: 1. Non filling of the gallbladder even following morphine administration. Appearance favors obstructed cystic duct and acute cholecystitis. 2. Patent common bile duct. 3. Prolonged blood pool activity raising suspicion for mild hepatocellular dysfunction. Electronically Signed   By: Gaylyn Rong M.D.   On: 07/12/2023 12:36    Assessment/Plan Cholelithiasis   Hyperbilirubinemia with transaminitis Patient had significant hyperbilirubinemia with transaminitis, and reports an episode of severe epigastric pain several days ago, which has resolved. I reviewed his imaging, which shows dilation of the CBD with smooth tapering to the ampulla. HIDA and ERCP both indicate occlusion of the cystic duct, and patient had sludge in the CBD on ERCP. Given his episode of epigastric pain, in the setting of CBD dilation, and now a precipitous decline in LFTs after clearance of the common bile duct, this is all consistent with biliary obstruction secondary to choledocholithiasis. He may have passed a larger stone that spontaneously cleared prior to the MRI and ERCP. LFTs were markedly elevated for an obstructive stone, but given the steep decline today and smooth tapering of the CBD on imaging, I think an underlying malignancy is unlikely. He does not have any pain or tenderness on exam to indicate cholecystitis,  however given the choledocholithiasis I recommended a cholecystectomy to prevent recurrent choledocholithiasis. The patient is agreeable to this. - Ok to advance diet today, keep NPO after midnight - Trend LFTs - Tentatively plan for laparoscopic cholecystectomy tomorrow if LFTs continue to improve - Hold heparin gtt at midnight - Discussed with primary team   LOS: 10 days   I reviewed Consultant GI notes, hospitalist notes, last 24 h vitals and pain scores, last 48 h intake and output, last 24 h labs and trends, and last 24 h imaging results.   Fritzi Mandes, MD [facility] 07/13/2023, 11:43 AM Please see Amion for pager number during day hours 7:00am-4:30pm

## 2023-07-13 NOTE — Anesthesia Preprocedure Evaluation (Signed)
Anesthesia Evaluation  Patient identified by MRN, date of birth, ID band Patient awake    Reviewed: Allergy & Precautions, NPO status , Patient's Chart, lab work & pertinent test results  Airway Mallampati: III  TM Distance: >3 FB Neck ROM: Full    Dental  (+) Teeth Intact, Dental Advisory Given   Pulmonary former smoker   breath sounds clear to auscultation       Cardiovascular + dysrhythmias Atrial Fibrillation  Rhythm:Regular Rate:Normal  Echo (2022):  1. Left ventricular ejection fraction, by estimation, is 65 to 70%. The  left ventricle has normal function. The left ventricle has no regional  wall motion abnormalities. Left ventricular diastolic parameters were  normal.   2. Right ventricular systolic function is normal. The right ventricular  size is normal.   3. The mitral valve is normal in structure. No evidence of mitral valve  regurgitation. No evidence of mitral stenosis.   4. The aortic valve is calcified. Aortic valve regurgitation is not  visualized. Mild aortic valve sclerosis is present, with no evidence of  aortic valve stenosis.   5. The inferior vena cava is normal in size with greater than 50%  respiratory variability, suggesting right atrial pressure of 3 mmHg.     Neuro/Psych negative neurological ROS  negative psych ROS   GI/Hepatic Neg liver ROS, PUD,,,  Endo/Other  diabetes    Renal/GU Renal disease     Musculoskeletal negative musculoskeletal ROS (+)    Abdominal   Peds  Hematology  (+) Blood dyscrasia, anemia   Anesthesia Other Findings   Reproductive/Obstetrics                             Anesthesia Physical Anesthesia Plan  ASA: 3  Anesthesia Plan: General   Post-op Pain Management: Tylenol PO (pre-op)* and Toradol IV (intra-op)*   Induction: Intravenous  PONV Risk Score and Plan: 3 and Ondansetron, Dexamethasone and Midazolam  Airway Management  Planned: Oral ETT  Additional Equipment: None  Intra-op Plan:   Post-operative Plan: Extubation in OR  Informed Consent: I have reviewed the patients History and Physical, chart, labs and discussed the procedure including the risks, benefits and alternatives for the proposed anesthesia with the patient or authorized representative who has indicated his/her understanding and acceptance.     Dental advisory given  Plan Discussed with: CRNA  Anesthesia Plan Comments:        Anesthesia Quick Evaluation

## 2023-07-13 NOTE — Progress Notes (Signed)
Physical Therapy Treatment Patient Details Name: DORUS BRUSCHI MRN: [id number] DOB: 06-29-1956 Today's Date: 07/13/2023   History of Present Illness 67 y.o. male presenting to [facility] on 07/02/23 for RLE pain and swelling after a fall awaiting quadriceps tendon surgery. Patient was recently seen in the ED on 06/27/23 for fall and right leg injury.  X-ray of the femur showed no evidence of fracture.  His pain was felt to be related to right quadriceps tendon injury.  He was made nonweightbearing on crutches, knee immobilizer placed. Past medical history significant of paroxysmal A-fib status post DCCV in 2021 on Xarelto, history of first and second-degree AV block not requiring pacemaker placement, atrial tachycardia, insulin-dependent type 2 diabetes, CKD stage IIIa, hypertension, hyperlipidemia, former smoker, obesity, hidradenitis suppurativa, depression. Pt is now s/p quad tendon repair on 12/20.    PT Comments  Pt tolerated treatment well today. Pt with continued good progress with ambulation however noted to have increased knee buckling today compared to previous sessions. No change in DC/DME recs at this time. PT will continue to follow.     If plan is discharge home, recommend the following: A little help with walking and/or transfers;A little help with bathing/dressing/bathroom;Assistance with cooking/housework;Assist for transportation;Help with stairs or ramp for entrance   Can travel by private vehicle        Equipment Recommendations  Rolling walker (2 wheels)    Recommendations for Other Services       Precautions / Restrictions Precautions Precautions: Fall Required Braces or Orthoses: Knee Immobilizer - Right Knee Immobilizer - Right: On at all times Restrictions Weight Bearing Restrictions Per Provider Order: Yes RLE Weight Bearing Per Provider Order: Weight bearing as tolerated Other Position/Activity Restrictions: WBAT RLE in Knee immobilizer     Mobility  Bed  Mobility Overal bed mobility: Modified Independent Bed Mobility: Supine to Sit, Sit to Supine     Supine to sit: Modified independent (Device/Increase time) Sit to supine: Modified independent (Device/Increase time)   General bed mobility comments: Pt able to use LLE to assist RLE off bed.    Transfers Overall transfer level: Needs assistance Equipment used: Rolling walker (2 wheels) Transfers: Sit to/from Stand Sit to Stand: Supervision           General transfer comment: Good hand placement    Ambulation/Gait Ambulation/Gait assistance: Supervision Gait Distance (Feet): 200 Feet Assistive device: Rolling walker (2 wheels) Gait Pattern/deviations: Decreased stride length, Step-through pattern, Knees buckling (Knee locked in extension) Gait velocity: decreased     General Gait Details: Pt was able ambulate in hallway with good speed. Pt noted to have increased knee buckling today compared to previous sessions however no LOB noted.   Stairs             Wheelchair Mobility     Tilt Bed    Modified Rankin (Stroke Patients Only)       Balance Overall balance assessment: Needs assistance Sitting-balance support: Feet supported, No upper extremity supported Sitting balance-Leahy Scale: Fair Sitting balance - Comments: sitting EOB   Standing balance support: Bilateral upper extremity supported, During functional activity, Reliant on assistive device for balance Standing balance-Leahy Scale: Poor Standing balance comment: reliant on RW support                            Cognition Arousal: Alert Behavior During Therapy: WFL for tasks assessed/performed Overall Cognitive Status: Within Functional Limits for tasks assessed  Exercises      General Comments General comments (skin integrity, edema, etc.): VSS      Pertinent Vitals/Pain Pain Assessment Pain Assessment: No/denies pain     Home Living                          Prior Function            PT Goals (current goals can now be found in the care plan section) Progress towards PT goals: Progressing toward goals    Frequency    Min 1X/week      PT Plan      Co-evaluation              AM-PAC PT "6 Clicks" Mobility   Outcome Measure  Help needed turning from your back to your side while in a flat bed without using bedrails?: A Little Help needed moving from lying on your back to sitting on the side of a flat bed without using bedrails?: A Little Help needed moving to and from a bed to a chair (including a wheelchair)?: A Little Help needed standing up from a chair using your arms (e.g., wheelchair or bedside chair)?: A Little Help needed to walk in hospital room?: A Little Help needed climbing 3-5 steps with a railing? : A Little 6 Click Score: 18    End of Session Equipment Utilized During Treatment: Gait belt Activity Tolerance: Patient tolerated treatment well Patient left: in bed;with call bell/phone within reach Nurse Communication: Mobility status PT Visit Diagnosis: Other abnormalities of gait and mobility (R26.89)     Time: 7829-5621 PT Time Calculation (min) (ACUTE ONLY): 12 min  Charges:    $Gait Training: 8-22 mins PT General Charges $$ ACUTE PT VISIT: 1 Visit                     Shela Nevin, PT, DPT Acute Rehab Services [phone]    Gladys Damme 07/13/2023, 3:23 PM

## 2023-07-13 NOTE — Progress Notes (Signed)
Patient ID: Leroy Chen, male   DOB: Oct 11, 1955, 67 y.o.   MRN: [id number]    Progress Note   Subjective   Day # 11 CC; right upper quadrant pain, abnormal imaging, elevated LFTs  IV Rocephin/Flagyl  HIDA scan yesterday positive-no visualization of the gallbladder ERCP yesterday-common bile duct mildly dilated largest diameter 9 mm, sludge present, sphincterotomy done and duct was balloon swept, final occlusion cholangiogram clear  Labs today-WBC 8.8/hemoglobin 8.3/hematocrit 27.1 BUN 20/creatinine 1.84 T. bili 9.1/alk phos 563/AST 330/ALT 42-improved/trending down  Patient says he is feeling fine, he is hungry and would like something to eat, no complaints of abdominal pain currently no pain post ERCP last p.m.   Objective   Vital signs in last 24 hours: Temp:  [97.8 F (36.6 C)-98.3 F (36.8 C)] 97.8 F (36.6 C) (12/27 0800) Pulse Rate:  [82-111] 82 (12/27 0800) Resp:  [18-33] 22 (12/27 0800) BP: (142-178)/(59-88) 162/63 (12/27 0800) SpO2:  [95 %-97 %] 96 % (12/27 0800) Last BM Date : 07/11/23 General:   Older African-American male in NAD Heart:  Regular rate and rhythm; no murmurs Lungs: Respirations even and unlabored, lungs CTA bilaterally Abdomen:  Soft, nontender and nondistended. Normal bowel sounds. Extremities: Long brace on the right lower extremity Neurologic:  Alert and oriented,  grossly normal neurologically. Psych:  Cooperative. Normal mood and affect.  Intake/Output from previous day: 12/26 0701 - 12/27 0700 In: 400 [I.V.:400] Out: 1850 [Urine:1850] Intake/Output this shift: No intake/output data recorded.  Lab Results: Recent Labs    07/11/23 0504 07/12/23 0508 07/13/23 0529  WBC 8.3 7.6 8.8  HGB 8.9* 8.7* 8.3*  HCT 28.6* 27.5* 27.1*  PLT 350 351 369   BMET Recent Labs    07/11/23 0504 07/12/23 0508 07/13/23 0529  NA 139 137 136  K 3.8 4.1 4.2  CL 104 104 106  CO2 21* 21* 19*  GLUCOSE 178* 154* 199*  BUN 14 16 24*  CREATININE  1.65* 1.83* 1.84*  CALCIUM 8.9 8.6* 8.7*   LFT Recent Labs    07/10/23 1504 07/11/23 0504 07/13/23 0529  PROT  --    < > 7.3  ALBUMIN  --    < > 2.8*  AST  --    < > 330*  ALT  --    < > 482*  ALKPHOS  --    < > 563*  BILITOT 14.9*   < > 9.1*  BILIDIR 9.2*  --   --   IBILI 5.7*  --   --    < > = values in this interval not displayed.   PT/INR Recent Labs    07/10/23 1504  LABPROT 25.0*  INR 2.2*    Studies/Results: US RENAL Result Date: 07/12/2023 CLINICAL DATA:  Acute kidney injury EXAM: RENAL / URINARY TRACT ULTRASOUND COMPLETE COMPARISON:  CT 07/10/2023 FINDINGS: Right Kidney: Renal measurements: 10.4 x 5.8 x 4.8 cm = volume: 152 mL. Echogenicity within normal limits. No mass or hydronephrosis visualized. Left Kidney: Renal measurements: 11.3 x 5.6 x 4.9 cm = volume: 163 mL. Echogenicity within normal limits. No mass or hydronephrosis visualized. Bladder: Appears normal for degree of bladder distention. Other: Penile prosthesis noted to the left of the bladder. IMPRESSION: No acute findings. Electronically Signed   By: Charlett Nose M.D.   On: 07/12/2023 23:21   DG ERCP Result Date: 07/12/2023 CLINICAL DATA:  Choledocholithiasis EXAM: ERCP TECHNIQUE: Multiple spot images obtained with the fluoroscopic device and submitted for interpretation post-procedure. FLUOROSCOPY: Radiation  Exposure Index (as provided by the fluoroscopic device): 38.83 mGy Kerma COMPARISON:  MRCP 07/10/2023 FINDINGS: A total of 25 saved images are submitted for review. The images demonstrate a flexible duodenal scope in the descending duodenum with wire cannulation of the common bile duct. Cholangiogram demonstrates nonocclusive filling defect in the distal common bile duct. Subsequent images confirm sphincterotomy and balloon sweeping of the common duct. IMPRESSION: 1. Choledocholithiasis. 2. ERCP with sphincterotomy and balloon sweeping of the common duct. These images were submitted for radiologic  interpretation only. Please see the procedural report for the amount of contrast and the fluoroscopy time utilized. Electronically Signed   By: Malachy Moan M.D.   On: 07/12/2023 16:53   NM Hepatobiliary Liver Func Result Date: 07/12/2023 CLINICAL DATA:  Right upper quadrant abdominal pain EXAM: NUCLEAR MEDICINE HEPATOBILIARY IMAGING TECHNIQUE: Sequential images of the abdomen were obtained out to 60 minutes following intravenous administration of radiopharmaceutical. RADIOPHARMACEUTICALS:  9.8 mCi Tc-57m  Choletec IV (initial) 1.5 millicurie Tc 99 Choletec (reinjection immediately prior to morphine administration) 3.0 mg IV morphine COMPARISON:  MRI 09/09/2022 FINDINGS: Prolonged blood pool activity may suggest some degree of hepatocellular dysfunction. Biliary activity is visible by 10 minutes with bowel activity at 25 minutes. Over the initial 60 minutes, gallbladder activity was not observed. Subsequently, 3.0 mg of IV morphine was administered to the patient. After this, imaging over 30 minutes demonstrated continued bowel activity but no gallbladder activity. IMPRESSION: 1. Non filling of the gallbladder even following morphine administration. Appearance favors obstructed cystic duct and acute cholecystitis. 2. Patent common bile duct. 3. Prolonged blood pool activity raising suspicion for mild hepatocellular dysfunction. Electronically Signed   By: Gaylyn Rong M.D.   On: 07/12/2023 12:36       Assessment / Plan:    #28 67 year old male admitted about 11 days ago with right lower extremity swelling and pain after he had fallen.  He was found to have a large hematoma in the right thigh in setting of chronic anticoagulation and underwent a right quadriceps repair on 07/06/2023  #2 developed acute right upper quadrant abdominal pain on 07/10/2023 and also noted at that time to have significantly elevated LFTs. Pain resolved after several hours and has not recurred  Imaging with CT  consistent with acute cholecystitis with multiple gallstones, distended gallbladder and pericholecystic inflammatory changes.  MRI/MRCP showed layering sludge in the gallbladder, severe gallbladder wall thickening and trace pericholecystic fluid.  There is also a 2.2 x 1.4 cm rim-enhancing lesion in the right lobe of the liver concerning for possible hepatic abscess versus malignancy. CBD was dilated to 1 cm in the pancreatic head then tapered smoothly  ERCP yesterday with finding of sludge in the distal bile duct, sphincterotomy was done and balloon swept, final cholangiogram clear  He has had significant decline in bilirubin since yesterday, and has not had any recurrence of abdominal pain.  However HIDA scan yesterday was positive, and there was no filling of the gallbladder at ERCP.  He continues on IV antibiotics  All of above findings are consistent with acute cholecystitis, though atypical with no persistent pain   #3 diabetes mellitus insulin-dependent #4.  Atrial fibrillation-currently covering with heparin #5 anemia multifactorial  Plan; soft diet today, n.p.o. past midnight Continue IV antibiotics Continue to trend LFTs Surgery has seen today and Dr. Freida Busman plans for cholecystectomy tomorrow  He will also need follow-up imaging of the probable small hepatic cyst abscess, prior to discharge so can determine need for continued antibiotics.  Principal Problem:   Cellulitis of right lower extremity Active Problems:   Severe sepsis (HCC)   Unspecified atrial fibrillation (HCC)   Hyponatremia   Acute kidney injury superimposed on chronic kidney disease (HCC)   Sinus tachycardia   Normocytic anemia   Metabolic acidosis   Serum total bilirubin elevated   Quadriceps tendon rupture, right, initial encounter   Abnormal finding on GI tract imaging   Dilated bile duct   Elevated liver enzymes   Anticoagulated on heparin   Elevated LFTs     LOS: 10 days   Yaniv Lage PA-C 07/13/2023, 12:17 PM

## 2023-07-14 ENCOUNTER — Inpatient Hospital Stay (HOSPITAL_COMMUNITY): Payer: No Typology Code available for payment source | Admitting: Anesthesiology

## 2023-07-14 ENCOUNTER — Inpatient Hospital Stay (HOSPITAL_COMMUNITY): Payer: No Typology Code available for payment source

## 2023-07-14 ENCOUNTER — Encounter (HOSPITAL_COMMUNITY): Payer: Self-pay | Admitting: Internal Medicine

## 2023-07-14 ENCOUNTER — Other Ambulatory Visit: Payer: Self-pay

## 2023-07-14 ENCOUNTER — Encounter (HOSPITAL_COMMUNITY)
Admission: EM | Disposition: A | Discharge status: Home Health | Payer: Self-pay | Source: Home / Self Care | Attending: Internal Medicine

## 2023-07-14 DIAGNOSIS — K812 Acute cholecystitis with chronic cholecystitis: Secondary | ICD-10-CM

## 2023-07-14 DIAGNOSIS — K838 Other specified diseases of biliary tract: Secondary | ICD-10-CM | POA: Diagnosis not present

## 2023-07-14 DIAGNOSIS — R17 Unspecified jaundice: Secondary | ICD-10-CM | POA: Diagnosis not present

## 2023-07-14 DIAGNOSIS — I4891 Unspecified atrial fibrillation: Secondary | ICD-10-CM | POA: Diagnosis not present

## 2023-07-14 DIAGNOSIS — R933 Abnormal findings on diagnostic imaging of other parts of digestive tract: Secondary | ICD-10-CM | POA: Diagnosis not present

## 2023-07-14 HISTORY — PX: CHOLECYSTECTOMY: SHX55

## 2023-07-14 LAB — COMPREHENSIVE METABOLIC PANEL
ALT: 350 U/L — ABNORMAL HIGH (ref 0–44)
AST: 245 U/L — ABNORMAL HIGH (ref 15–41)
Albumin: 2.6 g/dL — ABNORMAL LOW (ref 3.5–5.0)
Alkaline Phosphatase: 573 U/L — ABNORMAL HIGH (ref 38–126)
Anion gap: 7 (ref 5–15)
BUN: 22 mg/dL (ref 8–23)
CO2: 23 mmol/L (ref 22–32)
Calcium: 8.5 mg/dL — ABNORMAL LOW (ref 8.9–10.3)
Chloride: 105 mmol/L (ref 98–111)
Creatinine, Ser: 1.68 mg/dL — ABNORMAL HIGH (ref 0.61–1.24)
GFR, Estimated: 45 mL/min — ABNORMAL LOW (ref >60)
Glucose, Bld: 165 mg/dL — ABNORMAL HIGH (ref 70–99)
Potassium: 3.7 mmol/L (ref 3.5–5.1)
Sodium: 135 mmol/L (ref 135–145)
Total Bilirubin: 5.8 mg/dL — ABNORMAL HIGH (ref <1.2)
Total Protein: 7.3 g/dL (ref 6.5–8.1)

## 2023-07-14 LAB — TYPE AND SCREEN
ABO/RH(D): A POS
Antibody Screen: NEGATIVE

## 2023-07-14 LAB — GLUCOSE, CAPILLARY
Glucose-Capillary: 144 mg/dL — ABNORMAL HIGH (ref 70–99)
Glucose-Capillary: 153 mg/dL — ABNORMAL HIGH (ref 70–99)
Glucose-Capillary: 158 mg/dL — ABNORMAL HIGH (ref 70–99)
Glucose-Capillary: 165 mg/dL — ABNORMAL HIGH (ref 70–99)
Glucose-Capillary: 181 mg/dL — ABNORMAL HIGH (ref 70–99)
Glucose-Capillary: 282 mg/dL — ABNORMAL HIGH (ref 70–99)

## 2023-07-14 LAB — CBC WITH DIFFERENTIAL/PLATELET
Abs Immature Granulocytes: 0 10*3/uL (ref 0.00–0.07)
Basophils Absolute: 0.1 10*3/uL (ref 0.0–0.1)
Basophils Relative: 1 %
Eosinophils Absolute: 0.2 10*3/uL (ref 0.0–0.5)
Eosinophils Relative: 2 %
HCT: 26.1 % — ABNORMAL LOW (ref 39.0–52.0)
Hemoglobin: 8.2 g/dL — ABNORMAL LOW (ref 13.0–17.0)
Lymphocytes Relative: 16 %
Lymphs Abs: 1.3 10*3/uL (ref 0.7–4.0)
MCH: 29.7 pg (ref 26.0–34.0)
MCHC: 31.4 g/dL (ref 30.0–36.0)
MCV: 94.6 fL (ref 80.0–100.0)
Monocytes Absolute: 0.7 10*3/uL (ref 0.1–1.0)
Monocytes Relative: 8 %
Neutro Abs: 6.1 10*3/uL (ref 1.7–7.7)
Neutrophils Relative %: 73 %
Platelets: 374 10*3/uL (ref 150–400)
RBC: 2.76 MIL/uL — ABNORMAL LOW (ref 4.22–5.81)
RDW: 17 % — ABNORMAL HIGH (ref 11.5–15.5)
WBC: 8.3 10*3/uL (ref 4.0–10.5)
nRBC: 3.1 % — ABNORMAL HIGH (ref 0.0–0.2)
nRBC: 4 /100{WBCs} — ABNORMAL HIGH

## 2023-07-14 LAB — PROCALCITONIN: Procalcitonin: 2.11 ng/mL

## 2023-07-14 LAB — ANTINUCLEAR ANTIBODIES, IFA: ANA Ab, IFA: NEGATIVE

## 2023-07-14 LAB — MAGNESIUM: Magnesium: 2.2 mg/dL (ref 1.7–2.4)

## 2023-07-14 LAB — C-REACTIVE PROTEIN: CRP: 8.8 mg/dL — ABNORMAL HIGH (ref <1.0)

## 2023-07-14 SURGERY — LAPAROSCOPIC CHOLECYSTECTOMY
Anesthesia: General

## 2023-07-14 MED ORDER — INSULIN GLARGINE-YFGN 100 UNIT/ML ~~LOC~~ SOLN
15.0000 [IU] | Freq: Two times a day (BID) | SUBCUTANEOUS | Status: DC
  Administered 2023-07-14 – 2023-07-16 (×4): 15 [IU] via SUBCUTANEOUS
  Filled 2023-07-14 (×5): qty 0.15

## 2023-07-14 MED ORDER — EPHEDRINE SULFATE-NACL 50-0.9 MG/10ML-% IV SOSY
PREFILLED_SYRINGE | INTRAVENOUS | Status: DC | PRN
  Administered 2023-07-14 (×2): 10 mg via INTRAVENOUS

## 2023-07-14 MED ORDER — BUPIVACAINE LIPOSOME 1.3 % IJ SUSP
INTRAMUSCULAR | Status: DC | PRN
  Administered 2023-07-14: 40 mL

## 2023-07-14 MED ORDER — BUPIVACAINE LIPOSOME 1.3 % IJ SUSP
INTRAMUSCULAR | Status: AC
  Filled 2023-07-14: qty 20

## 2023-07-14 MED ORDER — CHLORHEXIDINE GLUCONATE 0.12 % MT SOLN
OROMUCOSAL | Status: AC
  Administered 2023-07-14: 15 mL via OROMUCOSAL
  Filled 2023-07-14: qty 15

## 2023-07-14 MED ORDER — OXYCODONE HCL 5 MG PO TABS: 5.0000 mg | ORAL_TABLET | Freq: Once | ORAL | Status: DC | PRN

## 2023-07-14 MED ORDER — ONDANSETRON HCL 4 MG/2ML IJ SOLN
INTRAMUSCULAR | Status: DC | PRN
  Administered 2023-07-14: 4 mg via INTRAVENOUS

## 2023-07-14 MED ORDER — ACETAMINOPHEN 10 MG/ML IV SOLN
1000.0000 mg | Freq: Once | INTRAVENOUS | Status: DC | PRN
Start: 2023-07-14 — End: 2023-07-14

## 2023-07-14 MED ORDER — ROCURONIUM BROMIDE 10 MG/ML (PF) SYRINGE
PREFILLED_SYRINGE | INTRAVENOUS | Status: AC
Start: 2023-07-14 — End: ?
  Filled 2023-07-14: qty 10

## 2023-07-14 MED ORDER — LACTATED RINGERS IV SOLN: INTRAVENOUS | Status: DC

## 2023-07-14 MED ORDER — SUCCINYLCHOLINE CHLORIDE 200 MG/10ML IV SOSY
PREFILLED_SYRINGE | INTRAVENOUS | Status: AC
  Filled 2023-07-14: qty 10

## 2023-07-14 MED ORDER — ONDANSETRON HCL 4 MG/2ML IJ SOLN: 4.0000 mg | Freq: Once | INTRAMUSCULAR | Status: DC | PRN

## 2023-07-14 MED ORDER — 0.9 % SODIUM CHLORIDE (POUR BTL) OPTIME
TOPICAL | Status: DC | PRN
  Administered 2023-07-14: 1000 mL

## 2023-07-14 MED ORDER — INSULIN GLARGINE-YFGN 100 UNIT/ML ~~LOC~~ SOLN
15.0000 [IU] | Freq: Two times a day (BID) | SUBCUTANEOUS | Status: DC
  Filled 2023-07-14: qty 0.15

## 2023-07-14 MED ORDER — SODIUM CHLORIDE 0.9 % IR SOLN
Status: DC | PRN
  Administered 2023-07-14: 600 mL

## 2023-07-14 MED ORDER — ONDANSETRON HCL 4 MG/2ML IJ SOLN
INTRAMUSCULAR | Status: AC
  Filled 2023-07-14: qty 2

## 2023-07-14 MED ORDER — INSULIN GLARGINE-YFGN 100 UNIT/ML ~~LOC~~ SOLN: 15.0000 [IU] | Freq: Two times a day (BID) | SUBCUTANEOUS | Status: DC

## 2023-07-14 MED ORDER — ORAL CARE MOUTH RINSE: 15.0000 mL | Freq: Once | OROMUCOSAL | Status: AC

## 2023-07-14 MED ORDER — LACTATED RINGERS IV SOLN: INTRAVENOUS | Status: AC

## 2023-07-14 MED ORDER — PROPOFOL 10 MG/ML IV BOLUS
INTRAVENOUS | Status: AC
Start: 2023-07-14 — End: ?
  Filled 2023-07-14: qty 20

## 2023-07-14 MED ORDER — FENTANYL CITRATE (PF) 250 MCG/5ML IJ SOLN
INTRAMUSCULAR | Status: DC | PRN
  Administered 2023-07-14: 100 ug via INTRAVENOUS
  Administered 2023-07-14: 50 ug via INTRAVENOUS

## 2023-07-14 MED ORDER — PHENYLEPHRINE 80 MCG/ML (10ML) SYRINGE FOR IV PUSH (FOR BLOOD PRESSURE SUPPORT)
PREFILLED_SYRINGE | INTRAVENOUS | Status: DC | PRN
  Administered 2023-07-14: 80 ug via INTRAVENOUS
  Administered 2023-07-14 (×2): 160 ug via INTRAVENOUS

## 2023-07-14 MED ORDER — LIDOCAINE 2% (20 MG/ML) 5 ML SYRINGE
INTRAMUSCULAR | Status: DC | PRN
  Administered 2023-07-14: 50 mg via INTRAVENOUS

## 2023-07-14 MED ORDER — FENTANYL CITRATE (PF) 250 MCG/5ML IJ SOLN
INTRAMUSCULAR | Status: AC
  Filled 2023-07-14: qty 5

## 2023-07-14 MED ORDER — OXYCODONE HCL 5 MG/5ML PO SOLN: 5.0000 mg | Freq: Once | ORAL | Status: DC | PRN

## 2023-07-14 MED ORDER — MIDAZOLAM HCL 2 MG/2ML IJ SOLN
INTRAMUSCULAR | Status: DC | PRN
  Administered 2023-07-14: 2 mg via INTRAVENOUS

## 2023-07-14 MED ORDER — PROPOFOL 10 MG/ML IV BOLUS
INTRAVENOUS | Status: DC | PRN
  Administered 2023-07-14: 150 mg via INTRAVENOUS

## 2023-07-14 MED ORDER — INSULIN ASPART 100 UNIT/ML IJ SOLN: 0.0000 [IU] | INTRAMUSCULAR | Status: DC | PRN

## 2023-07-14 MED ORDER — ROCURONIUM BROMIDE 10 MG/ML (PF) SYRINGE
PREFILLED_SYRINGE | INTRAVENOUS | Status: DC | PRN
  Administered 2023-07-14: 50 mg via INTRAVENOUS

## 2023-07-14 MED ORDER — DEXAMETHASONE SODIUM PHOSPHATE 10 MG/ML IJ SOLN
INTRAMUSCULAR | Status: AC
Start: 2023-07-14 — End: ?
  Filled 2023-07-14: qty 1

## 2023-07-14 MED ORDER — MIDAZOLAM HCL 2 MG/2ML IJ SOLN
INTRAMUSCULAR | Status: AC
Start: 2023-07-14 — End: ?
  Filled 2023-07-14: qty 2

## 2023-07-14 MED ORDER — SUGAMMADEX SODIUM 200 MG/2ML IV SOLN
INTRAVENOUS | Status: DC | PRN
  Administered 2023-07-14: 200 mg via INTRAVENOUS

## 2023-07-14 MED ORDER — CHLORHEXIDINE GLUCONATE 0.12 % MT SOLN: 15.0000 mL | Freq: Once | OROMUCOSAL | Status: AC

## 2023-07-14 MED ORDER — BUPIVACAINE-EPINEPHRINE (PF) 0.25% -1:200000 IJ SOLN
INTRAMUSCULAR | Status: AC
Start: 2023-07-14 — End: ?
  Filled 2023-07-14: qty 30

## 2023-07-14 MED ORDER — FENTANYL CITRATE (PF) 100 MCG/2ML IJ SOLN: 25.0000 ug | INTRAMUSCULAR | Status: DC | PRN

## 2023-07-14 MED ORDER — CHLORHEXIDINE GLUCONATE 4 % EX SOLN
1.0000 | Freq: Once | CUTANEOUS | Status: DC
  Filled 2023-07-14: qty 15

## 2023-07-14 MED ORDER — DEXAMETHASONE SODIUM PHOSPHATE 10 MG/ML IJ SOLN
INTRAMUSCULAR | Status: DC | PRN
  Administered 2023-07-14: 10 mg via INTRAVENOUS

## 2023-07-14 MED ORDER — BUPIVACAINE HCL (PF) 0.25 % IJ SOLN
INTRAMUSCULAR | Status: AC
  Filled 2023-07-14: qty 20

## 2023-07-14 MED ORDER — PHENYLEPHRINE HCL-NACL 20-0.9 MG/250ML-% IV SOLN
INTRAVENOUS | Status: DC | PRN
  Administered 2023-07-14: 50 ug/min via INTRAVENOUS

## 2023-07-14 SURGICAL SUPPLY — 40 items
APPLIER CLIP 5 13 M/L LIGAMAX5 (MISCELLANEOUS) ×2 IMPLANT
BLADE CLIPPER SURG (BLADE) IMPLANT
CANISTER SUCT 3000ML PPV (MISCELLANEOUS) ×1 IMPLANT
CHLORAPREP W/TINT 26 (MISCELLANEOUS) ×1 IMPLANT
CLIP APPLIE 5 13 M/L LIGAMAX5 (MISCELLANEOUS) ×1 IMPLANT
COVER SURGICAL LIGHT HANDLE (MISCELLANEOUS) ×1 IMPLANT
DERMABOND ADVANCED .7 DNX12 (GAUZE/BANDAGES/DRESSINGS) ×1 IMPLANT
DISSECTOR BLUNT TIP ENDO 5MM (MISCELLANEOUS) IMPLANT
DRAIN CHANNEL 19F RND (DRAIN) IMPLANT
DRSG TEGADERM 4X4.75 (GAUZE/BANDAGES/DRESSINGS) IMPLANT
ELECT CAUTERY BLADE 6.4 (BLADE) ×1 IMPLANT
ELECT REM PT RETURN 9FT ADLT (ELECTROSURGICAL) ×1 IMPLANT
ELECTRODE REM PT RTRN 9FT ADLT (ELECTROSURGICAL) ×1 IMPLANT
ENDOLOOP SUT PDS II 0 18 (SUTURE) IMPLANT
EVACUATOR SILICONE 100CC (DRAIN) IMPLANT
GAUZE SPONGE 2X2 STRL 8-PLY (GAUZE/BANDAGES/DRESSINGS) IMPLANT
GLOVE BIO SURGEON STRL SZ 6.5 (GLOVE) ×1 IMPLANT
GLOVE BIOGEL PI IND STRL 6 (GLOVE) ×1 IMPLANT
GOWN STRL REUS W/ TWL LRG LVL3 (GOWN DISPOSABLE) ×3 IMPLANT
IRRIG SUCT STRYKERFLOW 2 WTIP (MISCELLANEOUS) ×1 IMPLANT
IRRIGATION SUCT STRKRFLW 2 WTP (MISCELLANEOUS) IMPLANT
KIT BASIN OR (CUSTOM PROCEDURE TRAY) ×1 IMPLANT
KIT TURNOVER KIT B (KITS) ×1 IMPLANT
NS IRRIG 1000ML POUR BTL (IV SOLUTION) ×1 IMPLANT
PAD ARMBOARD 7.5X6 YLW CONV (MISCELLANEOUS) ×1 IMPLANT
PENCIL BUTTON HOLSTER BLD 10FT (ELECTRODE) ×1 IMPLANT
POUCH RETRIEVAL ECOSAC 10 (ENDOMECHANICALS) ×1 IMPLANT
SCISSORS LAP 5X35 DISP (ENDOMECHANICALS) ×1 IMPLANT
SET TUBE SMOKE EVAC HIGH FLOW (TUBING) ×1 IMPLANT
SLEEVE Z-THREAD 5X100MM (TROCAR) ×2 IMPLANT
SUT ETHILON 2 0 FS 18 (SUTURE) IMPLANT
SUT MNCRL AB 4-0 PS2 18 (SUTURE) ×1 IMPLANT
SUT VIC AB 0 UR5 27 (SUTURE) IMPLANT
SUT VICRYL 0 AB UR-6 (SUTURE) IMPLANT
TOWEL GREEN STERILE FF (TOWEL DISPOSABLE) ×1 IMPLANT
TRAY LAPAROSCOPIC MC (CUSTOM PROCEDURE TRAY) ×1 IMPLANT
TROCAR BALLN 12MMX100 BLUNT (TROCAR) ×1 IMPLANT
TROCAR Z-THREAD OPTICAL 5X100M (TROCAR) ×1 IMPLANT
WARMER LAPAROSCOPE (MISCELLANEOUS) ×1 IMPLANT
WATER STERILE IRR 1000ML POUR (IV SOLUTION) ×1 IMPLANT

## 2023-07-14 NOTE — Progress Notes (Signed)
Seen and examined. Plan lap chole for h/o choledocho. Informed consent was obtained after detailed explanation of risks, including bleeding, infection, biloma, hematoma, injury to common bile duct, need for IOC to delineate anatomy, and need for conversion to open procedure. All questions answered to the patient's satisfaction.  Diamantina Monks, MD General and Trauma Surgery [facility]

## 2023-07-14 NOTE — Progress Notes (Signed)
Progress Note   Subjective: LFTs continue to downtrend, Tbili 5.8.  Objective: Vital signs in last 24 hours: Temp:  [98 F (36.7 C)-98.5 F (36.9 C)] 98.5 F (36.9 C) (12/28 0434) Pulse Rate:  [71-82] 77 (12/28 1104) Resp:  [18-19] 19 (12/28 0434) BP: (147-168)/(67-78) 153/75 (12/28 1104) SpO2:  [95 %-97 %] 96 % (12/28 0434) Last BM Date : 07/11/23  Intake/Output from previous day: 12/27 0701 - 12/28 0700 In: 1930.3 [P.O.:240; I.V.:1190.3; IV Piggyback:500] Out: 1325 [Urine:1325] Intake/Output this shift: No intake/output data recorded.  PE: General: NAD, resting in bed Neuro: alert and oriented HEENT: sclera icteric  Lungs:  Respiratory effort nonlabored Abd: soft, NT, ND. Well-healed lower midline surgical scar.     Lab Results:  Recent Labs    07/13/23 0529 07/14/23 0340  WBC 8.8 8.3  HGB 8.3* 8.2*  HCT 27.1* 26.1*  PLT 369 374   BMET Recent Labs    07/13/23 0529 07/14/23 0340  NA 136 135  K 4.2 3.7  CL 106 105  CO2 19* 23  GLUCOSE 199* 165*  BUN 24* 22  CREATININE 1.84* 1.68*  CALCIUM 8.7* 8.5*   PT/INR No results for input(s): "LABPROT", "INR" in the last 72 hours.  CMP     Component Value Date/Time   NA 135 07/14/2023 0340   K 3.7 07/14/2023 0340   CL 105 07/14/2023 0340   CO2 23 07/14/2023 0340   GLUCOSE 165 (H) 07/14/2023 0340   BUN 22 07/14/2023 0340   CREATININE 1.68 (H) 07/14/2023 0340   CALCIUM 8.5 (L) 07/14/2023 0340   PROT 7.3 07/14/2023 0340   ALBUMIN 2.6 (L) 07/14/2023 0340   AST 245 (H) 07/14/2023 0340   ALT 350 (H) 07/14/2023 0340   ALKPHOS 573 (H) 07/14/2023 0340   BILITOT 5.8 (H) 07/14/2023 0340   GFRNONAA 45 (L) 07/14/2023 0340   GFRAA >60 04/28/2019 0914   Lipase     Component Value Date/Time   LIPASE 90 (H) 07/10/2023 0757       Studies/Results: US RENAL Result Date: 07/12/2023 CLINICAL DATA:  Acute kidney injury EXAM: RENAL / URINARY TRACT ULTRASOUND COMPLETE COMPARISON:  CT 07/10/2023 FINDINGS:  Right Kidney: Renal measurements: 10.4 x 5.8 x 4.8 cm = volume: 152 mL. Echogenicity within normal limits. No mass or hydronephrosis visualized. Left Kidney: Renal measurements: 11.3 x 5.6 x 4.9 cm = volume: 163 mL. Echogenicity within normal limits. No mass or hydronephrosis visualized. Bladder: Appears normal for degree of bladder distention. Other: Penile prosthesis noted to the left of the bladder. IMPRESSION: No acute findings. Electronically Signed   By: Charlett Nose M.D.   On: 07/12/2023 23:21   DG ERCP Result Date: 07/12/2023 CLINICAL DATA:  Choledocholithiasis EXAM: ERCP TECHNIQUE: Multiple spot images obtained with the fluoroscopic device and submitted for interpretation post-procedure. FLUOROSCOPY: Radiation Exposure Index (as provided by the fluoroscopic device): 38.83 mGy Kerma COMPARISON:  MRCP 07/10/2023 FINDINGS: A total of 25 saved images are submitted for review. The images demonstrate a flexible duodenal scope in the descending duodenum with wire cannulation of the common bile duct. Cholangiogram demonstrates nonocclusive filling defect in the distal common bile duct. Subsequent images confirm sphincterotomy and balloon sweeping of the common duct. IMPRESSION: 1. Choledocholithiasis. 2. ERCP with sphincterotomy and balloon sweeping of the common duct. These images were submitted for radiologic interpretation only. Please see the procedural report for the amount of contrast and the fluoroscopy time utilized. Electronically Signed   By: Isac Caddy.D.  On: 07/12/2023 16:53   NM Hepatobiliary Liver Func Result Date: 07/12/2023 CLINICAL DATA:  Right upper quadrant abdominal pain EXAM: NUCLEAR MEDICINE HEPATOBILIARY IMAGING TECHNIQUE: Sequential images of the abdomen were obtained out to 60 minutes following intravenous administration of radiopharmaceutical. RADIOPHARMACEUTICALS:  9.8 mCi Tc-72m  Choletec IV (initial) 1.5 millicurie Tc 99 Choletec (reinjection immediately prior to  morphine administration) 3.0 mg IV morphine COMPARISON:  MRI 09/09/2022 FINDINGS: Prolonged blood pool activity may suggest some degree of hepatocellular dysfunction. Biliary activity is visible by 10 minutes with bowel activity at 25 minutes. Over the initial 60 minutes, gallbladder activity was not observed. Subsequently, 3.0 mg of IV morphine was administered to the patient. After this, imaging over 30 minutes demonstrated continued bowel activity but no gallbladder activity. IMPRESSION: 1. Non filling of the gallbladder even following morphine administration. Appearance favors obstructed cystic duct and acute cholecystitis. 2. Patent common bile duct. 3. Prolonged blood pool activity raising suspicion for mild hepatocellular dysfunction. Electronically Signed   By: Gaylyn Rong M.D.   On: 07/12/2023 12:36    Assessment/Plan Cholelithiasis, choledocholithisis Hyperbilirubinemia with transaminitis - Significant transaminitis and hyperbilirubinemia are rapidly improving after ERCP with sphincterotomy and bile duct clearance. - OR this afternoon for laparoscopic cholecystectomy with Dr. Bedelia Person. May have to delay until tomorrow due to other emergency cases. - Keep NPO for OR, heparin gtt on hold - Discussed plan of care with the patient   LOS: 11 days   I reviewed Consultant GI notes, hospitalist notes, last 24 h vitals and pain scores, last 48 h intake and output, last 24 h labs and trends, and last 24 h imaging results.   Fritzi Mandes, MD [facility] Surgery 07/14/2023, 11:11 AM Please see Amion for pager number during day hours 7:00am-4:30pm

## 2023-07-14 NOTE — Progress Notes (Signed)
PROGRESS NOTE                                                                                                                                                                                                             Patient Demographics:    Leroy Chen, is a 67 y.o. male, DOB - July 17, 1956, HQI:[id number]  Outpatient Primary MD for the patient is [name]    LOS - 11  Admit date - 07/02/2023    Chief Complaint  Patient presents with   Leg Pain       Brief Narrative (HPI from H&P)    67 y.o. male with medical history significant of paroxysmal A-fib status post DCCV in 2021 on Xarelto, history of first and second-degree AV block not requiring pacemaker placement, atrial tachycardia, insulin-dependent type 2 diabetes, CKD stage IIIa, hypertension, hyperlipidemia, former smoker, obesity, hidradenitis suppurativa, depression.  Patient was recently seen in the ED on 06/27/23 for fall and right leg injury.  X-ray of the femur showed no evidence of fracture.  His pain was felt to be related to right quadriceps tendon injury.  He was made nonweightbearing on crutches, knee immobilizer placed, and was discharged with plan for orthopedic follow-up.   Continue to have swelling of his right leg with right thigh pain and discomfort came to the ER where CT scan showed very large amount of fluid collection in his right lower extremity in the thigh area, he was admitted for further care.  Further workup suggest that he might have a very large hematoma in his right thigh due to being on Xarelto and having a soft tissue injury.   Subjective:   Patient in bed, appears comfortable, denies any headache, no fever, no chest pain or pressure, no shortness of breath , no abdominal pain. No focal weakness.  Some discomfort in the right knee after working with PT.   Assessment  & Plan :    Right lower extremity soft tissue injury with  fall 1 week ago with right lower extremity swelling and possible secondary cellulitis and foreign body did on CT scan.  He was diagnosed with severe sepsis at the time of admission as well.  I think patient's main issue is right thigh soft tissue hematoma which he incurred due to his severe fall 1 week prior to admission in the  setting of him being on Xarelto, CT scan has been noted.  He is already been placed on antibiotics for possible secondary cellulitis and sepsis this has been clinically ruled out.  Orthopedics was consulted, MRI of the right leg obtained with large quadriceps tear and patellar injury and soft tissue hematoma, case discussed with Dr. Lajoyce Corners on 07/05/2023, stopped antibiotics as this is not sepsis or infection, he underwent surgical correction on 07/07/2023 by Dr. Lajoyce Corners of quadriceps tendon repair.  Tolerated the procedure well.  Per Ortho. Patient may be weightbearing as tolerated with the knee immobilizer.  Dr. Lajoyce Corners will follow in the office in 2 weeks.  Of note patient was doing well but on 07/14/2023 heard some pop in his knee and is having some discomfort, 4 view x-ray ordered, Dr. Lajoyce Corners informed he will see the patient on 07/15/2023.   Acute right upper quadrant abdominal pain starting night of 07/09/2023.  LFTs and total bilirubin are high, question if he has a CBD stone or has passed a CBD stone, general surgery and GI were consulted.  Placed on empiric antibiotics.  Symptoms have resolved thankfully.  He underwent MRCP, HIDA scan and ERCP as below.  HIDA scan suspicious for cholecystitis, underwent ERCP where there was CBD sludge it was removed and sphincterotomy was done, LFTs have improved, case discussed with GI and general surgery on 07/13/2023 he will likely will have cholecystectomy done on 07/14/2023.  Continue antibiotics and monitor.  Clinically improving.  Sinus tachycardia - Due to #1 above supportive care and pain control. Improved.   Normocytic anemia -  Hemoglobin currently 11.1, was 16.5 in July 2024.  Patient is chronically anticoagulated although denies any symptoms of GI bleed.  Acute anemia is clearly due to soft tissue hematoma and blood loss in the right lower extremity.  Monitor.  Type screen also done.  No signs of GI bleed continue PPI.  Continue to monitor.  Will stop his Xarelto and place him on heparin for now.   AKI on CKD stage IIIa - Likely prerenal in etiology in the setting of blood loss, improving with IV fluids continue while he is n.p.o., renal ultrasound nonacute.  Paroxysmal A-fib status post DCCV in 2021 - History of atrial tachycardia: Rate is controlled  - Continue  metoprolol, and flecainide.  Xarelto/heparin drip.  Hypertension  Blood pressure only on the soft side continue to hold ARB, reduce beta-blocker and hydrate on 07/08/2023.   Hyperlipidemia hold Crestor until his LFTs stabilize.   Depression  Continue Zoloft.   GERD  Continue Protonix.  Mild hyponatremia  Gentle hydration   High anion gap metabolic acidosis  - he takes Jardiance and had developed euglycemic DKA which has resolved after insulin drip, transition to subcu insulin and sliding scale, discontinue Jardiance permanently upon discharge.    Weakness and deconditioning.  PT OT, ambulate in the hallway, sit in chair in the daytime, prepare for home discharge on 07/10/2023 if he does well with PT.  He still feels extremely weak overall.    Insulin-dependent type 2 diabetes -on long-acting insulin twice a day along with sliding scale, dose adjusted while he is n.p.o. on daily monitor insulin dose closely..   Lab Results  Component Value Date   HGBA1C 7.6 (H) 07/03/2023   CBG (last 3)  Recent Labs    07/13/23 1757 07/14/23 0028 07/14/23 0525  GLUCAP 182* 158* 165*         Condition - Fair  Family Communication  : Wife [phone]  on 07/04/2023 at 7:53 AM, 07/08/2023, 07/11/2023  Code Status :  Full  Consults  :  Ortho, GI, general  surgery  PUD Prophylaxis : PPI   Procedures  :     Renal ultrasound.  Non acute  ERCP -  Impression:               - The examination was suspicious for CBD sludge.                           - The common bile duct was mildly dilated,                            uncertain significance.                           - A biliary sphincterotomy was performed.                           - The biliary tree was swept and sludge was found. Recommendation:           - Return patient to hospital ward for ongoing care.                           - Observe patient's clinical course following                            today's ERCP with therapeutic intervention.                           - Trend LFTs.                           - OK to resume IV heparin without a bolus after                            2000 today.                           - General surgery to review. Imaging is concerning                            for acute cholecystitis however his acute abdominal                            pain has resolved.  HIDA scan.  1. Non filling of the gallbladder even following morphine administration. Appearance favors obstructed cystic duct and acute cholecystitis. 2. Patent common bile duct. 3. Prolonged blood pool activity raising suspicion for mild hepatocellular dysfunction.  MRCP.  1. Small volume of layering sludge in the gallbladder. Severe gallbladder wall thickening and trace pericholecystic fluid. Findings are consistent with acute cholecystitis. 2. Heterogeneously rim enhancing lesion in the posterior right lobe of the liver measuring 2.2 x 1.4 cm, with adjacent small heterogeneously enhancing areas of T2 hyperintensity posteriorly. Findings are most consistent with hepatic abscess in this clinical setting. Malignancy remains a differential consideration and at minimum, recommend follow-up imaging in 3 months following acute clinical presentation to ensure complete  resolution. 3. Central common bile duct  is dilated up to 1.0 cm in the pancreatic head, however the duct tapers smoothly to the ampulla without calculus or other evidence of obstruction. Aortic Atherosclerosis (ICD10-I70.0)  CT Abd - Pelvis - 1. Small volume of layering sludge in the gallbladder. Severe gallbladder wall thickening and trace pericholecystic fluid. Findings are consistent with acute cholecystitis. 2. Heterogeneously rim enhancing lesion in the posterior right lobe of the liver measuring 2.2 x 1.4 cm, with adjacent small heterogeneously enhancing areas of T2 hyperintensity posteriorly. Findings are most consistent with hepatic abscess in this clinical setting. Malignancy remains a differential consideration and at minimum, recommend follow-up imaging in 3 months following acute clinical presentation to ensure complete resolution. 3. Central common bile duct is dilated up to 1.0 cm in the pancreatic head, however the duct tapers smoothly to the ampulla without calculus or other evidence of obstruction. Aortic Atherosclerosis (ICD10-I70.0).   MRI.  Right lower extremity.    1. High-grade partial tear of the distal quadriceps tendon, involving the superficial central fibers arising from the rectus femoris tendon as well as some of the medial fibers arising from the vastus medialis. The deep central and lateral fibers appear intact. 2. Large amount of heterogeneous soft tissue swelling anteriorly in the distal thigh, likely representing soft tissue hematoma. 3. Possible partial tear of the medial patellofemoral ligament at its patellar attachment. 4. The menisci, cruciate and collateral ligaments appear intact. 5. No evidence of acute fracture or osteonecrosis  Lower extremity venous duplex.  No DVT.    CT right hip.  No fracture.    CT scan tibia-fibula right leg.   1. Subcutaneous edema throughout the mid and lower thigh surrounding the knee. No evidence for soft tissue gas or fluid collection. 2. No acute osseous abnormality. 3.  Thin linear radiopaque foreign body measuring 1 cm within the lateral subcutaneous tissues of the distal right lower extremity.      Disposition Plan  :    Status is: Inpatient   DVT Prophylaxis  :    SCDs Start: 07/06/23 1801 SCDs Start: 07/04/23 0813 Place and maintain sequential compression device Start: 07/04/23 0718   Lab Results  Component Value Date   PLT 374 07/14/2023    Diet :  Diet Order             Diet NPO time specified Except for: Sips with Meds  Diet effective midnight                    Inpatient Medications  Scheduled Meds:  docusate sodium  100 mg Oral BID   feeding supplement (GLUCERNA SHAKE)  237 mL Oral TID BM   flecainide  50 mg Oral BID   insulin aspart  0-15 Units Subcutaneous Q6H   insulin glargine-yfgn  15 Units Subcutaneous BID   metoprolol tartrate  25 mg Oral BID   multivitamin  1 tablet Oral QHS   pantoprazole  40 mg Oral Daily   sertraline  100 mg Oral Daily   Continuous Infusions:  cefTRIAXone (ROCEPHIN)  IV Stopped (07/13/23 1358)   lactated ringers     metronidazole Stopped (07/14/23 0116)     PRN Meds:.acetaminophen, albuterol, bisacodyl, dicyclomine, hydrALAZINE, HYDROmorphone (DILAUDID) injection, methocarbamol **OR** [DISCONTINUED] methocarbamol (ROBAXIN) injection, metoCLOPramide **OR** [DISCONTINUED] metoCLOPramide (REGLAN) injection, naLOXone (NARCAN)  injection, [DISCONTINUED] ondansetron **OR** ondansetron (ZOFRAN) IV, oxyCODONE, polyethylene glycol    Objective:   Vitals:   07/13/23 1936 07/14/23 0000 07/14/23 0434 07/14/23 1104  BP: (!) 161/67 (!) 147/69 (!) 168/78 (!) 153/75  Pulse: 82 77 71 77  Resp: 19 18 19    Temp: 98.2 F (36.8 C) 98 F (36.7 C) 98.5 F (36.9 C)   TempSrc: Oral Oral Oral   SpO2: 95% 95% 96%   Weight:      Height:        Wt Readings from Last 3 Encounters:  07/02/23 113.4 kg  06/27/23 113.4 kg  02/12/23 113 kg     Intake/Output Summary (Last 24 hours) at 07/14/2023  1123 Last data filed at 07/14/2023 1112 Gross per 24 hour  Intake 1930.31 ml  Output 1725 ml  Net 205.31 ml     Physical Exam  Awake Alert, No new F.N deficits, Normal affect Scott.AT,PERRAL Supple Neck, No JVD,   Symmetrical Chest wall movement, Good air movement bilaterally, CTAB RRR,No Gallops, Rubs or new Murmurs,  +ve B.Sounds, Abd Soft, minimal right upper quadrant tenderness Right knee and leg in knee immobilizer   Data Review:    Recent Labs  Lab 07/10/23 0757 07/11/23 0504 07/12/23 0508 07/13/23 0529 07/14/23 0340  WBC 7.9 8.3 7.6 8.8 8.3  HGB 9.2* 8.9* 8.7* 8.3* 8.2*  HCT 28.7* 28.6* 27.5* 27.1* 26.1*  PLT 346 350 351 369 374  MCV 94.7 95.7 95.8 94.8 94.6  MCH 30.4 29.8 30.3 29.0 29.7  MCHC 32.1 31.1 31.6 30.6 31.4  RDW 15.2 15.8* 16.4* 16.6* 17.0*  LYMPHSABS 0.4* 1.0 1.2 0.9 1.3  MONOABS 1.1* 1.3* 1.1* 0.9 0.7  EOSABS 0.0 0.1 0.2 0.0 0.2  BASOSABS 0.0 0.1 0.1 0.0 0.1    Recent Labs  Lab 07/08/23 0439 07/09/23 0444 07/10/23 0757 07/10/23 1014 07/10/23 1504 07/11/23 0504 07/12/23 0508 07/13/23 0529 07/14/23 0340  NA 138   < > 134*  --   --  139 137 136 135  K 4.8   < > 4.1  --   --  3.8 4.1 4.2 3.7  CL 106   < > 102  --   --  104 104 106 105  CO2 25   < > 18*  --   --  21* 21* 19* 23  ANIONGAP 7   < > 14  --   --  14 12 11 7   GLUCOSE 162*   < > 239*  --   --  178* 154* 199* 165*  BUN 19   < > 15  --   --  14 16 24* 22  CREATININE 1.84*   < > 1.49*  --   --  1.65* 1.83* 1.84* 1.68*  AST  --   --  407*  --   --  1,023* 1,002* 330* 245*  ALT  --   --  212*  --   --  565* 706* 482* 350*  ALKPHOS  --   --  271*  --   --  376* 558* 563* 573*  BILITOT  --    < > 13.2*  --  14.9* 18.3* 18.6* 9.1* 5.8*  ALBUMIN  --   --  2.7*  --   --  2.4* 2.4* 2.8* 2.6*  CRP 24.6*  --   --  18.5*  --  17.8* 19.9* 16.9* 8.8*  PROCALCITON 0.47   < >  --  0.50  --  0.70 0.60 2.71 2.11  INR  --   --   --   --  2.2*  --   --   --   --  BNP 90.7  --   --   --   --   --    --   --   --   MG 2.2  --   --   --   --  2.3 2.5* 2.7* 2.2  CALCIUM 8.6*   < > 8.6*  --   --  8.9 8.6* 8.7* 8.5*   < > = values in this interval not displayed.      Recent Labs  Lab 07/08/23 0439 07/09/23 0444 07/10/23 0757 07/10/23 1014 07/10/23 1504 07/11/23 0504 07/12/23 0508 07/13/23 0529 07/14/23 0340  CRP 24.6*  --   --  18.5*  --  17.8* 19.9* 16.9* 8.8*  PROCALCITON 0.47   < >  --  0.50  --  0.70 0.60 2.71 2.11  INR  --   --   --   --  2.2*  --   --   --   --   BNP 90.7  --   --   --   --   --   --   --   --   MG 2.2  --   --   --   --  2.3 2.5* 2.7* 2.2  CALCIUM 8.6*   < > 8.6*  --   --  8.9 8.6* 8.7* 8.5*   < > = values in this interval not displayed.    --------------------------------------------------------------------------------------------------------------- No results found for: "CHOL", "HDL", "LDLCALC", "LDLDIRECT", "TRIG", "CHOLHDL"  Lab Results  Component Value Date   HGBA1C 7.6 (H) 07/03/2023     Micro Results Recent Results (from the past 240 hours)  MRSA Next Gen by PCR, Nasal     Status: None   Collection Time: 07/04/23 11:24 AM   Specimen: Nasal Mucosa; Nasal Swab  Result Value Ref Range Status   MRSA by PCR Next Gen NOT DETECTED NOT DETECTED Final    Comment: (NOTE) The GeneXpert MRSA Assay (FDA approved for NASAL specimens only), is one component of a comprehensive MRSA colonization surveillance program. It is not intended to diagnose MRSA infection nor to guide or monitor treatment for MRSA infections. Test performance is not FDA approved in patients less than 59 years old. Performed at [facility], [address] [place], [place] [address]   Surgical PCR screen     Status: None   Collection Time: 07/06/23  8:23 AM   Specimen: Nasal Mucosa; Nasal Swab  Result Value Ref Range Status   MRSA, PCR NEGATIVE NEGATIVE Final   Staphylococcus aureus NEGATIVE NEGATIVE Final    Comment: (NOTE) The Xpert SA Assay (FDA approved for  NASAL specimens in patients 20 years of age and older), is one component of a comprehensive surveillance program. It is not intended to diagnose infection nor to guide or monitor treatment. Performed at [facility], [address] [place], [place] [address]     Radiology Reports US RENAL Result Date: 07/12/2023 CLINICAL DATA:  Acute kidney injury EXAM: RENAL / URINARY TRACT ULTRASOUND COMPLETE COMPARISON:  CT 07/10/2023 FINDINGS: Right Kidney: Renal measurements: 10.4 x 5.8 x 4.8 cm = volume: 152 mL. Echogenicity within normal limits. No mass or hydronephrosis visualized. Left Kidney: Renal measurements: 11.3 x 5.6 x 4.9 cm = volume: 163 mL. Echogenicity within normal limits. No mass or hydronephrosis visualized. Bladder: Appears normal for degree of bladder distention. Other: Penile prosthesis noted to the left of the bladder. IMPRESSION: No acute findings. Electronically Signed   By: Charlett Nose  M.D.   On: 07/12/2023 23:21   DG ERCP Result Date: 07/12/2023 CLINICAL DATA:  Choledocholithiasis EXAM: ERCP TECHNIQUE: Multiple spot images obtained with the fluoroscopic device and submitted for interpretation post-procedure. FLUOROSCOPY: Radiation Exposure Index (as provided by the fluoroscopic device): 38.83 mGy Kerma COMPARISON:  MRCP 07/10/2023 FINDINGS: A total of 25 saved images are submitted for review. The images demonstrate a flexible duodenal scope in the descending duodenum with wire cannulation of the common bile duct. Cholangiogram demonstrates nonocclusive filling defect in the distal common bile duct. Subsequent images confirm sphincterotomy and balloon sweeping of the common duct. IMPRESSION: 1. Choledocholithiasis. 2. ERCP with sphincterotomy and balloon sweeping of the common duct. These images were submitted for radiologic interpretation only. Please see the procedural report for the amount of contrast and the fluoroscopy time utilized. Electronically Signed   By: Malachy Moan M.D.   On: 07/12/2023 16:53   NM Hepatobiliary Liver Func Result Date: 07/12/2023 CLINICAL DATA:  Right upper quadrant abdominal pain EXAM: NUCLEAR MEDICINE HEPATOBILIARY IMAGING TECHNIQUE: Sequential images of the abdomen were obtained out to 60 minutes following intravenous administration of radiopharmaceutical. RADIOPHARMACEUTICALS:  9.8 mCi Tc-80m  Choletec IV (initial) 1.5 millicurie Tc 99 Choletec (reinjection immediately prior to morphine administration) 3.0 mg IV morphine COMPARISON:  MRI 09/09/2022 FINDINGS: Prolonged blood pool activity may suggest some degree of hepatocellular dysfunction. Biliary activity is visible by 10 minutes with bowel activity at 25 minutes. Over the initial 60 minutes, gallbladder activity was not observed. Subsequently, 3.0 mg of IV morphine was administered to the patient. After this, imaging over 30 minutes demonstrated continued bowel activity but no gallbladder activity. IMPRESSION: 1. Non filling of the gallbladder even following morphine administration. Appearance favors obstructed cystic duct and acute cholecystitis. 2. Patent common bile duct. 3. Prolonged blood pool activity raising suspicion for mild hepatocellular dysfunction. Electronically Signed   By: Gaylyn Rong M.D.   On: 07/12/2023 12:36   MR 3D Recon At Scanner Result Date: 07/10/2023 : CLINICAL DATA: Jaundice, right upper quadrant abdominal pain, gallstones, indeterminate liver lesion EXAM: MRI ABDOMEN WITHOUT AND WITH CONTRAST (INCLUDING MRCP) TECHNIQUE: Multiplanar multisequence MR imaging of the abdomen was performed both before and after the administration of intravenous contrast. Heavily T2-weighted images of the biliary and pancreatic ducts were obtained, and three-dimensional MRCP images were rendered by post processing. CONTRAST: 10mL GADAVIST GADOBUTROL 1 MMOL/ML IV SOLN COMPARISON: Same-day CT abdomen pelvis CT abdomen pelvis, 11/10/2020 FINDINGS: Lower chest: No acute  abnormality. Hepatobiliary: Heterogeneously rim enhancing lesion in the posterior right lobe of the liver measuring 2.2 x 1.4 cm (series 1401, image 77), with adjacent small heterogeneously enhancing areas of T2 hyperintensity posteriorly. Small volume of layering sludge in the gallbladder (series 8, image 20). Severe gallbladder wall thickening and trace pericholecystic fluid. Central common bile duct is dilated up to 1.0 cm in the pancreatic head, however the duct tapers smoothly to the ampulla without calculus or other evidence of obstruction (series 2, image 18). Pancreas: Unremarkable. No pancreatic ductal dilatation or surrounding inflammatory changes. Spleen: Normal in size without significant abnormality. Adrenals/Urinary Tract: Adrenal glands are unremarkable. Kidneys are normal, without renal calculi, solid lesion, or hydronephrosis. Stomach/Bowel: Stomach is within normal limits. No evidence of bowel wall thickening, distention, or inflammatory changes. Vascular/Lymphatic: Aortic atherosclerosis. No enlarged abdominal lymph nodes. Other: No abdominal wall hernia or abnormality. No ascites. Musculoskeletal: No acute or significant osseous findings. IMPRESSION: 1. Small volume of layering sludge in the gallbladder. Severe gallbladder wall thickening and trace pericholecystic  fluid. Findings are consistent with acute cholecystitis. 2. Heterogeneously rim enhancing lesion in the posterior right lobe of the liver measuring 2.2 x 1.4 cm, with adjacent small heterogeneously enhancing areas of T2 hyperintensity posteriorly. Findings are most consistent with hepatic abscess in this clinical setting. Malignancy remains a differential consideration and at minimum, recommend follow-up imaging in 3 months following acute clinical presentation to ensure complete resolution. 3. Central common bile duct is dilated up to 1.0 cm in the pancreatic head, however the duct tapers smoothly to the ampulla without calculus or  other evidence of obstruction. Aortic Atherosclerosis (ICD10-I70.0). Electronically Signed   By: Jearld Lesch M.D.   On: 07/10/2023 19:46   MR ABDOMEN MRCP W WO CONTAST Result Date: 07/10/2023 CLINICAL DATA:  Jaundice, right upper quadrant abdominal pain, gallstones, indeterminate liver lesion EXAM: MRI ABDOMEN WITHOUT AND WITH CONTRAST (INCLUDING MRCP) TECHNIQUE: Multiplanar multisequence MR imaging of the abdomen was performed both before and after the administration of intravenous contrast. Heavily T2-weighted images of the biliary and pancreatic ducts were obtained, and three-dimensional MRCP images were rendered by post processing. CONTRAST:  10mL GADAVIST GADOBUTROL 1 MMOL/ML IV SOLN COMPARISON:  Same-day CT abdomen pelvis CT abdomen pelvis, 11/10/2020 FINDINGS: Lower chest: No acute abnormality. Hepatobiliary: Heterogeneously rim enhancing lesion in the posterior right lobe of the liver measuring 2.2 x 1.4 cm (series 1401, image 77), with adjacent small heterogeneously enhancing areas of T2 hyperintensity posteriorly. Small volume of layering sludge in the gallbladder (series 8, image 20). Severe gallbladder wall thickening and trace pericholecystic fluid. Central common bile duct is dilated up to 1.0 cm in the pancreatic head, however the duct tapers smoothly to the ampulla without calculus or other evidence of obstruction (series 2, image 18). Pancreas: Unremarkable. No pancreatic ductal dilatation or surrounding inflammatory changes. Spleen: Normal in size without significant abnormality. Adrenals/Urinary Tract: Adrenal glands are unremarkable. Kidneys are normal, without renal calculi, solid lesion, or hydronephrosis. Stomach/Bowel: Stomach is within normal limits. No evidence of bowel wall thickening, distention, or inflammatory changes. Vascular/Lymphatic: Aortic atherosclerosis. No enlarged abdominal lymph nodes. Other: No abdominal wall hernia or abnormality. No ascites. Musculoskeletal: No acute  or significant osseous findings. IMPRESSION: 1. Small volume of layering sludge in the gallbladder. Severe gallbladder wall thickening and trace pericholecystic fluid. Findings are consistent with acute cholecystitis. 2. Heterogeneously rim enhancing lesion in the posterior right lobe of the liver measuring 2.2 x 1.4 cm, with adjacent small heterogeneously enhancing areas of T2 hyperintensity posteriorly. Findings are most consistent with hepatic abscess in this clinical setting. Malignancy remains a differential consideration and at minimum, recommend follow-up imaging in 3 months following acute clinical presentation to ensure complete resolution. 3. Central common bile duct is dilated up to 1.0 cm in the pancreatic head, however the duct tapers smoothly to the ampulla without calculus or other evidence of obstruction. Aortic Atherosclerosis (ICD10-I70.0). Electronically Signed   By: Jearld Lesch M.D.   On: 07/10/2023 19:42       Signature  -   Susa Raring M.D on 07/14/2023 at 11:23 AM   -  To page go to [email or web address]

## 2023-07-14 NOTE — Op Note (Signed)
   Operative Note  Date: 07/14/2023  Procedure: laparoscopic cholecystectomy, adhesiolysis x55m  Pre-op diagnosis: choledocholithiasis Post-op diagnosis: gangrenous cholecystitis  Indication and clinical history: The patient is a 67 y.o. year old male with h/o choledocholithiasis  Surgeon: Diamantina Monks, MD  Anesthesiologist: Hart Rochester, MD Anesthesia: General  Findings:  Specimen: gallbladder EBL: 5cc Drains/Implants: 7F JP in gallbladder fossa  Disposition: PACU - hemodynamically stable.  Description of procedure: The patient was positioned supine on the operating room table. Time-out was performed verifying correct patient, procedure, signature of informed consent, and administration of pre-operative antibiotics. General anesthetic induction and intubation were uneventful. The abdomen was prepped and draped in the usual sterile fashion. A supra-umbilical incision was made using an open technique using zero vicryl stay sutures on either side of the fascia and a 10mm Hassan port inserted. After establishing pneumoperitoneum, which the patient tolerated well, the abdominal cavity was inspected and no injury of any intra-abdominal structures was identified, but extensive adhesions noted. Additional ports were placed under direct visualization and using local anesthetic: two 5mm ports in the right subcostal region and a 5mm port in the epigastric region. Adhesiolysis performed for approximately . The patient was re-positioned to reverse Trendelenburg and right side up. The gallbladder was then retracted cephalad. The infundibulum was identified and retracted toward the right lower quadrant. The peritoneum was incised over the infundibulum and the triangle of Calot dissected to expose the critical view of safety. With clear identification and isolation of the cystic duct and cystic artery, the cystic artery was doubly clipped and divided. After this, the cystic duct was identified as a  single structure entering the gallbladder, and was also doubly clipped and divided. The gallbladder was dissected off the liver bed using electrocautery. The posterior wall of the gallbladder was gangrenous. Hemostasis of the liver bed was achieved after to separation of the final peritoneal attachments of the gallbladder to the liver bed. There was extensive spillage of  bile and gallstones  during dissection. All visible bile and stones were suctioned. The gallbladder fossa was irrigated and fluid returned clear. After transection of the final peritoneal attachments, the gallbladder was placed in an endoscopic specimen retrieval bag, removed via the umbilical port site, and sent to pathology as a permanent specimen. The gallbladder fossa was inspected confirming hemostasis, the absence of bile leakage from the cystic duct stump, and correct placement of clips on the cystic artery and cystic duct stumps. A 7F JP drain was placed through the right lateral port site into the gallbladder fossa. The abdomen was desufflated and the fascia of the umbilical port site was closed using the previously placed stay sutures. Additional local anesthetic was administered at the umbilical port site.  The skin of all incisions was closed with 4-0 monocryl. Sterile dressings were applied. All sponge and instrument counts were correct at the conclusion of the procedure. The patient was awakened from anesthesia, extubated uneventfully, and transported to the PACU - hemodynamically stable.. There were no complications.   Upon entering the abdomen (organ space), I encountered infection of the gallbladder .  CASE DATA:  Type of patient?: DOW CASE (Surgical Hospitalist [facility] Inpatient)  Status of Case? URGENT Add On  Infection Present At Time Of Surgery (PATOS)?  INFECTION of the gallbladder    Diamantina Monks, MD General and Trauma Surgery [facility] Surgery

## 2023-07-14 NOTE — Plan of Care (Signed)
  Problem: Education: Goal: Ability to describe self-care measures that may prevent or decrease complications (Diabetes Survival Skills Education) will improve Outcome: Progressing Goal: Individualized Educational Video(s) Outcome: Progressing   Problem: Coping: Goal: Ability to adjust to condition or change in health will improve Outcome: Progressing   Problem: Fluid Volume: Goal: Ability to maintain a balanced intake and output will improve Outcome: Progressing   Problem: Health Behavior/Discharge Planning: Goal: Ability to identify and utilize available resources and services will improve Outcome: Progressing Goal: Ability to manage health-related needs will improve Outcome: Progressing   Problem: Metabolic: Goal: Ability to maintain appropriate glucose levels will improve Outcome: Progressing   Problem: Nutritional: Goal: Maintenance of adequate nutrition will improve Outcome: Progressing Goal: Progress toward achieving an optimal weight will improve Outcome: Progressing   Problem: Skin Integrity: Goal: Risk for impaired skin integrity will decrease Outcome: Progressing   Problem: Tissue Perfusion: Goal: Adequacy of tissue perfusion will improve Outcome: Progressing   Problem: Education: Goal: Knowledge of General Education information will improve Description: Including pain rating scale, medication(s)/side effects and non-pharmacologic comfort measures Outcome: Progressing   Problem: Health Behavior/Discharge Planning: Goal: Ability to manage health-related needs will improve Outcome: Progressing   Problem: Clinical Measurements: Goal: Ability to maintain clinical measurements within normal limits will improve Outcome: Progressing Goal: Will remain free from infection Outcome: Progressing Goal: Diagnostic test results will improve Outcome: Progressing Goal: Respiratory complications will improve Outcome: Progressing Goal: Cardiovascular complication will  be avoided Outcome: Progressing   Problem: Activity: Goal: Risk for activity intolerance will decrease Outcome: Progressing   Problem: Nutrition: Goal: Adequate nutrition will be maintained Outcome: Progressing   Problem: Coping: Goal: Level of anxiety will decrease Outcome: Progressing   Problem: Elimination: Goal: Will not experience complications related to bowel motility Outcome: Progressing Goal: Will not experience complications related to urinary retention Outcome: Progressing   Problem: Pain Management: Goal: General experience of comfort will improve Outcome: Progressing   Problem: Safety: Goal: Ability to remain free from injury will improve Outcome: Progressing   Problem: Skin Integrity: Goal: Risk for impaired skin integrity will decrease Outcome: Progressing   Problem: Clinical Measurements: Goal: Ability to avoid or minimize complications of infection will improve Outcome: Progressing   Problem: Skin Integrity: Goal: Skin integrity will improve Outcome: Progressing   Problem: Education: Goal: Ability to describe self-care measures that may prevent or decrease complications (Diabetes Survival Skills Education) will improve Outcome: Progressing Goal: Individualized Educational Video(s) Outcome: Progressing   Problem: Cardiac: Goal: Ability to maintain an adequate cardiac output will improve Outcome: Progressing   Problem: Health Behavior/Discharge Planning: Goal: Ability to identify and utilize available resources and services will improve Outcome: Progressing Goal: Ability to manage health-related needs will improve Outcome: Progressing   Problem: Fluid Volume: Goal: Ability to achieve a balanced intake and output will improve Outcome: Progressing   Problem: Metabolic: Goal: Ability to maintain appropriate glucose levels will improve Outcome: Progressing   Problem: Nutritional: Goal: Maintenance of adequate nutrition will  improve Outcome: Progressing Goal: Maintenance of adequate weight for body size and type will improve Outcome: Progressing   Problem: Respiratory: Goal: Will regain and/or maintain adequate ventilation Outcome: Progressing   Problem: Urinary Elimination: Goal: Ability to achieve and maintain adequate renal perfusion and functioning will improve Outcome: Progressing

## 2023-07-14 NOTE — Anesthesia Procedure Notes (Signed)
Procedure Name: Intubation Date/Time: 07/14/2023 3:58 PM  Performed by: Gloris Ham, CRNAPre-anesthesia Checklist: Patient identified, Emergency Drugs available, Suction available and Patient being monitored Patient Re-evaluated:Patient Re-evaluated prior to induction Oxygen Delivery Method: Circle System Utilized Preoxygenation: Pre-oxygenation with 100% oxygen Induction Type: IV induction Ventilation: Mask ventilation without difficulty Laryngoscope Size: Mac and 3 Grade View: Grade II Tube type: Oral Number of attempts: 1 Airway Equipment and Method: Stylet and Oral airway Placement Confirmation: ETT inserted through vocal cords under direct vision, positive ETCO2 and breath sounds checked- equal and bilateral Tube secured with: Tape Dental Injury: Teeth and Oropharynx as per pre-operative assessment

## 2023-07-14 NOTE — Transfer of Care (Signed)
Immediate Anesthesia Transfer of Care Note  Patient: Leroy Chen  Procedure(s) Performed: LAPAROSCOPIC CHOLECYSTECTOMY  Patient Location: PACU  Anesthesia Type:General  Level of Consciousness: awake, alert , and oriented  Airway & Oxygen Therapy: Patient Spontanous Breathing  Post-op Assessment: Report given to RN and Post -op Vital signs reviewed and stable  Post vital signs: Reviewed and stable  Last Vitals:  Vitals Value Taken Time  BP 152/67 07/14/23 1745  Temp    Pulse 102 07/14/23 1746  Resp 26 07/14/23 1746  SpO2 95 % 07/14/23 1746  Vitals shown include unfiled device data.  Last Pain:  Vitals:   07/14/23 1502  TempSrc: Oral  PainSc:       Patients Stated Pain Goal: 0 (07/14/23 1125)  Complications: There were no known notable events for this encounter.

## 2023-07-14 NOTE — Plan of Care (Signed)
  Problem: Education: Goal: Ability to describe self-care measures that may prevent or decrease complications (Diabetes Survival Skills Education) will improve Outcome: Progressing Goal: Individualized Educational Video(s) Outcome: Progressing   Problem: Coping: Goal: Ability to adjust to condition or change in health will improve Outcome: Progressing   Problem: Fluid Volume: Goal: Ability to maintain a balanced intake and output will improve Outcome: Progressing   Problem: Health Behavior/Discharge Planning: Goal: Ability to manage health-related needs will improve Outcome: Progressing   Problem: Skin Integrity: Goal: Risk for impaired skin integrity will decrease Outcome: Progressing   Problem: Education: Goal: Knowledge of General Education information will improve Description: Including pain rating scale, medication(s)/side effects and non-pharmacologic comfort measures Outcome: Progressing   Problem: Clinical Measurements: Goal: Diagnostic test results will improve Outcome: Progressing   Problem: Activity: Goal: Risk for activity intolerance will decrease Outcome: Progressing   Problem: Nutrition: Goal: Adequate nutrition will be maintained Outcome: Progressing   Problem: Pain Management: Goal: General experience of comfort will improve Outcome: Progressing   Problem: Safety: Goal: Ability to remain free from injury will improve Outcome: Progressing

## 2023-07-14 NOTE — Anesthesia Postprocedure Evaluation (Signed)
Anesthesia Post Note  Patient: Leroy Chen  Procedure(s) Performed: LAPAROSCOPIC CHOLECYSTECTOMY     Patient location during evaluation: PACU Anesthesia Type: General Level of consciousness: awake and alert Pain management: pain level controlled Vital Signs Assessment: post-procedure vital signs reviewed and stable Respiratory status: spontaneous breathing, nonlabored ventilation, respiratory function stable and patient connected to nasal cannula oxygen Cardiovascular status: blood pressure returned to baseline and stable Postop Assessment: no apparent nausea or vomiting Anesthetic complications: no  There were no known notable events for this encounter.  Last Vitals:  Vitals:   07/14/23 1830 07/14/23 1950  BP: (!) 166/64   Pulse: 100   Resp: 20 18  Temp: 36.6 C 36.6 C  SpO2: 96%     Last Pain:  Vitals:   07/14/23 1950  TempSrc: Oral  PainSc: 0-No pain                 Shelton Silvas

## 2023-07-15 ENCOUNTER — Encounter (HOSPITAL_COMMUNITY): Payer: Self-pay | Admitting: Surgery

## 2023-07-15 DIAGNOSIS — L03115 Cellulitis of right lower limb: Secondary | ICD-10-CM | POA: Diagnosis not present

## 2023-07-15 LAB — CBC WITH DIFFERENTIAL/PLATELET
Abs Immature Granulocytes: 0.25 10*3/uL — ABNORMAL HIGH (ref 0.00–0.07)
Basophils Absolute: 0 10*3/uL (ref 0.0–0.1)
Basophils Relative: 0 %
Eosinophils Absolute: 0 10*3/uL (ref 0.0–0.5)
Eosinophils Relative: 0 %
HCT: 28.5 % — ABNORMAL LOW (ref 39.0–52.0)
Hemoglobin: 8.7 g/dL — ABNORMAL LOW (ref 13.0–17.0)
Immature Granulocytes: 3 %
Lymphocytes Relative: 10 %
Lymphs Abs: 1 10*3/uL (ref 0.7–4.0)
MCH: 29.2 pg (ref 26.0–34.0)
MCHC: 30.5 g/dL (ref 30.0–36.0)
MCV: 95.6 fL (ref 80.0–100.0)
Monocytes Absolute: 0.9 10*3/uL (ref 0.1–1.0)
Monocytes Relative: 9 %
Neutro Abs: 7.7 10*3/uL (ref 1.7–7.7)
Neutrophils Relative %: 78 %
Platelets: 362 10*3/uL (ref 150–400)
RBC: 2.98 MIL/uL — ABNORMAL LOW (ref 4.22–5.81)
RDW: 17.5 % — ABNORMAL HIGH (ref 11.5–15.5)
WBC: 9.8 10*3/uL (ref 4.0–10.5)
nRBC: 1.9 % — ABNORMAL HIGH (ref 0.0–0.2)

## 2023-07-15 LAB — PHOSPHORUS: Phosphorus: 4.1 mg/dL (ref 2.5–4.6)

## 2023-07-15 LAB — COMPREHENSIVE METABOLIC PANEL
ALT: 294 U/L — ABNORMAL HIGH (ref 0–44)
AST: 172 U/L — ABNORMAL HIGH (ref 15–41)
Albumin: 2.7 g/dL — ABNORMAL LOW (ref 3.5–5.0)
Alkaline Phosphatase: 539 U/L — ABNORMAL HIGH (ref 38–126)
Anion gap: 9 (ref 5–15)
BUN: 23 mg/dL (ref 8–23)
CO2: 20 mmol/L — ABNORMAL LOW (ref 22–32)
Calcium: 8.6 mg/dL — ABNORMAL LOW (ref 8.9–10.3)
Chloride: 104 mmol/L (ref 98–111)
Creatinine, Ser: 1.47 mg/dL — ABNORMAL HIGH (ref 0.61–1.24)
GFR, Estimated: 52 mL/min — ABNORMAL LOW (ref >60)
Glucose, Bld: 234 mg/dL — ABNORMAL HIGH (ref 70–99)
Potassium: 4.8 mmol/L (ref 3.5–5.1)
Sodium: 133 mmol/L — ABNORMAL LOW (ref 135–145)
Total Bilirubin: 3.7 mg/dL — ABNORMAL HIGH (ref <1.2)
Total Protein: 7.1 g/dL (ref 6.5–8.1)

## 2023-07-15 LAB — GLUCOSE, CAPILLARY
Glucose-Capillary: 244 mg/dL — ABNORMAL HIGH (ref 70–99)
Glucose-Capillary: 212 mg/dL — ABNORMAL HIGH (ref 70–99)
Glucose-Capillary: 167 mg/dL — ABNORMAL HIGH (ref 70–99)

## 2023-07-15 LAB — MAGNESIUM: Magnesium: 2.3 mg/dL (ref 1.7–2.4)

## 2023-07-15 LAB — PROCALCITONIN: Procalcitonin: 1.68 ng/mL

## 2023-07-15 MED ORDER — CEFDINIR 300 MG PO CAPS
300.0000 mg | ORAL_CAPSULE | Freq: Two times a day (BID) | ORAL | Status: DC
Start: 2023-07-15 — End: 2023-07-16
  Administered 2023-07-15 – 2023-07-16 (×3): 300 mg via ORAL
  Filled 2023-07-15 (×3): qty 1

## 2023-07-15 MED ORDER — RIVAROXABAN 20 MG PO TABS
20.0000 mg | ORAL_TABLET | Freq: Every day | ORAL | Status: DC
  Administered 2023-07-15 – 2023-07-16 (×2): 20 mg via ORAL
  Filled 2023-07-15 (×2): qty 1

## 2023-07-15 MED ORDER — METRONIDAZOLE 500 MG PO TABS
500.0000 mg | ORAL_TABLET | Freq: Two times a day (BID) | ORAL | Status: DC
  Administered 2023-07-15 – 2023-07-16 (×3): 500 mg via ORAL
  Filled 2023-07-15 (×3): qty 1

## 2023-07-15 NOTE — Plan of Care (Signed)
°  Problem: Coping: Goal: Ability to adjust to condition or change in health will improve Outcome: Progressing   Problem: Metabolic: Goal: Ability to maintain appropriate glucose levels will improve Outcome: Progressing   Problem: Skin Integrity: Goal: Risk for impaired skin integrity will decrease Outcome: Progressing   Problem: Clinical Measurements: Goal: Ability to maintain clinical measurements within normal limits will improve Outcome: Progressing   Problem: Pain Management: Goal: General experience of comfort will improve Outcome: Progressing

## 2023-07-15 NOTE — Progress Notes (Signed)
[facility] PROGRESS NOTE  Leroy Chen (DOB: Aug 08, 1955) QMV:[id number] PCP: [name]  Brief Narrative: Brief Narrative (HPI from H&P)    67 y.o. male with medical history significant of paroxysmal A-fib status post DCCV in 2021 on Xarelto, history of first and second-degree AV block not requiring pacemaker placement, atrial tachycardia, insulin-dependent type 2 diabetes, CKD stage IIIa, hypertension, hyperlipidemia, former smoker, obesity, hidradenitis suppurativa, depression.  Patient was recently seen in the ED on 06/27/23 for fall and right leg injury.  X-ray of the femur showed no evidence of fracture.  His pain was felt to be related to right quadriceps tendon injury.  He was made nonweightbearing on crutches, knee immobilizer placed, and was discharged with plan for orthopedic follow-up.    Continue to have swelling of his right leg with right thigh pain and discomfort came to the ER where CT scan showed very large amount of fluid collection in his right lower extremity in the thigh area, he was admitted for further care.  Further workup suggest that he might have a very large hematoma in his right thigh due to being on Xarelto and having a soft tissue injury.  Subjective: More worried about abdomen than knee. Knee pain is continuing to improve. No bleeding, chest pain or dyspnea reported. Amenable to idea of discharge tomorrow. Had large BM since surgery. Tolerating diet some. Spouse at bedside.  Objective: BP (!) 143/63 (BP Location: Left Arm)   Pulse 78   Temp 98.2 F (36.8 C) (Oral)   Resp 16   Ht 6' 0.99" (1.854 m)   Wt 113.4 kg   SpO2 96%   BMI 32.99 kg/m   Gen: No distress Pulm: Clear, nonlabored  CV: Reg, rate in 80's, no MRG, trace edema GI: Soft, appropriately tender diffusely, nondistended, no rebound, +BS.  Neuro: Alert and oriented. No new focal deficits. Ext: Warm, dry, NVI. Able to raise right leg off bed with reasonable effort and  pain. Skin: JP drain site c/d/i Lap sites c/d/i. RLE in brace/ACE wrap c/d/i   Assessment & Plan: Right lower extremity soft tissue injury with fall 1 week ago with right lower extremity swelling and possible secondary cellulitis and foreign body did on CT scan.  He was diagnosed with severe sepsis at the time of admission as well though this is felt unlikely. - Orthopedics was consulted, MRI of the right leg obtained with large quadriceps tear and patellar injury and soft tissue hematoma, now s/p quad tendon repair 12/20 by Dr. Lajoyce Corners. WBAT in knee immobilizer, f/u in office in 2 weeks.  - Of note patient was doing well but on 07/14/2023 heard some pop in his knee and is having some discomfort. XR showed no acute osseous abnormality, though there is diffuse soft tissue swelling as anticipated. Dr. Lajoyce Corners informed and will reevaluate.      Acute cholecystitis with choledocholithiasis, obstructive jaundice: HIDA scan suspicious for cholecystitis, underwent ERCP 12/26 where there was CBD sludge it was removed and sphincterotomy was done, LFTs have improved, laparoscopic cholecystectomy and drain placement performed 07/14/2023. - Continue abx, transition to omnicef/po flagyl (tomorrow will be day 7), defer duration to surgery  - Recheck CMP in AM - Monitor JP drain output today, advance diet and if tolerating diet, pain controlled, WBC stays normal, will likely be able to DC 12/30 per Dr. Azucena Cecil.     Normocytic anemia - Hemoglobin currently 11.1, was 16.5 in July 2024.  Patient is chronically anticoagulated although denies any symptoms of  GI bleed.  Acute anemia is clearly due to soft tissue hematoma and blood loss in the right lower extremity.   - No signs of GI bleed continue PPI.   - Continue to monitor, DOAC held temporarily, hgb stable, will restart.    AKI on CKD stage IIIa - Likely prerenal in etiology in the setting of blood loss, improving.   Paroxysmal A-fib status post DCCV in 2021 - History  of atrial tachycardia: Rate is controlled   - Continue  metoprolol, and flecainide.   - Restart xarelto   HTN: Likely to restart home meds 12/30.   HLD: Holding Crestor for now   Depression  Continue Zoloft.   GERD  Continue Protonix.   Mild hyponatremia  Gentle hydration   High anion gap metabolic acidosis  - he takes Jardiance and had developed euglycemic DKA which has resolved after insulin drip, transition to subcu insulin and sliding scale, discontinue Jardiance permanently upon discharge.    Weakness and deconditioning.  PT OT, ambulate in the hallway, sit in chair in the daytime.  - HHPT to be arranged at discharge.   Insulin-dependent type 2 diabetes -on long-acting insulin twice a day along with sliding scale.  Tyrone Nine, MD [facility] [email or web address] 07/15/2023, 12:15 PM

## 2023-07-15 NOTE — Progress Notes (Signed)
Progress Note   Subjective/Interval: LFTs continue to downtrend, WBC and H/H stable  Objective: Vital signs in last 24 hours: Temp:  [97.1 F (36.2 C)-98.8 F (37.1 C)] 98.2 F (36.8 C) (12/29 0800) Pulse Rate:  [74-103] 78 (12/29 0800) Resp:  [16-29] 16 (12/29 0800) BP: (127-166)/(63-106) 143/63 (12/29 0800) SpO2:  [93 %-96 %] 96 % (12/29 0800) Weight:  [113.4 kg] 113.4 kg (12/28 1523) Last BM Date : 07/14/23  Intake/Output from previous day: 12/28 0701 - 12/29 0700 In: 2385.1 [P.O.:400; I.V.:1985.1] Out: 2220 [Urine:2100; Drains:100; Blood:20] Intake/Output this shift: No intake/output data recorded.  PE: General: NAD, resting in bed Neuro: alert and oriented HEENT: sclera icteric  Lungs:  Respiratory effort nonlabored Abd: soft, ND, appropriately tender to palpation, drain with SS drainage, 100cc over last 24h     Lab Results:  Recent Labs    07/14/23 0340 07/15/23 0353  WBC 8.3 9.8  HGB 8.2* 8.7*  HCT 26.1* 28.5*  PLT 374 362   BMET Recent Labs    07/14/23 0340 07/15/23 0353  NA 135 133*  K 3.7 4.8  CL 105 104  CO2 23 20*  GLUCOSE 165* 234*  BUN 22 23  CREATININE 1.68* 1.47*  CALCIUM 8.5* 8.6*   PT/INR No results for input(s): "LABPROT", "INR" in the last 72 hours.  CMP     Component Value Date/Time   NA 133 (L) 07/15/2023 0353   K 4.8 07/15/2023 0353   CL 104 07/15/2023 0353   CO2 20 (L) 07/15/2023 0353   GLUCOSE 234 (H) 07/15/2023 0353   BUN 23 07/15/2023 0353   CREATININE 1.47 (H) 07/15/2023 0353   CALCIUM 8.6 (L) 07/15/2023 0353   PROT 7.1 07/15/2023 0353   ALBUMIN 2.7 (L) 07/15/2023 0353   AST 172 (H) 07/15/2023 0353   ALT 294 (H) 07/15/2023 0353   ALKPHOS 539 (H) 07/15/2023 0353   BILITOT 3.7 (H) 07/15/2023 0353   GFRNONAA 52 (L) 07/15/2023 0353   GFRAA >60 04/28/2019 0914   Lipase     Component Value Date/Time   LIPASE 90 (H) 07/10/2023 0757       Studies/Results: DG Knee 3 Views Right Result Date:  07/14/2023 CLINICAL DATA:  Recurrent right knee pain after surgery EXAM: RIGHT KNEE - 3 VIEW COMPARISON:  07/04/2023 FINDINGS: Frontal, oblique, lateral, and sunrise views of the right knee are obtained. No acute fracture, subluxation, or dislocation. Mild 3 compartmental osteoarthritis. No joint effusion. There is diffuse soft tissue edema within the distal thigh and anterior knee, consistent with recent history of quadriceps tendon repair. Stable 6 mm linear metallic foreign body within the superficial soft tissues of the anterolateral distal right thigh. IMPRESSION: 1. No acute bony abnormality. 2. Diffuse soft tissue swelling throughout the distal thigh and anterior knee, consistent with recent history of trauma and subsequent quadriceps tendon repair. Electronically Signed   By: Sharlet Salina M.D.   On: 07/14/2023 14:52    Assessment/Plan Cholelithiasis, choledocholithisis Hyperbilirubinemia with transaminitis - s/p lap chole with Dr. Bedelia Person 12/28 - Continue drain to bulb suction - Regular diet - Continue antibiotics, ok to switch to PO - Will monitor drain quality with PO intake today to ensure no change concerning for bilious output. If stable can likely dc in AM   LOS: 12 days   I reviewed hospitalist notes, last 24 h vitals and pain scores, last 48 h intake and output, last 24 h labs and trends, and last 24 h imaging results.   [place]  Azucena Cecil, MD [facility] Surgery 07/15/2023, 8:43 AM Please see Amion for pager number during day hours 7:00am-4:30pm

## 2023-07-15 NOTE — Plan of Care (Signed)
°  Problem: Education: Goal: Individualized Educational Video(s) Outcome: Progressing   Problem: Coping: Goal: Ability to adjust to condition or change in health will improve Outcome: Progressing   Problem: Health Behavior/Discharge Planning: Goal: Ability to identify and utilize available resources and services will improve Outcome: Progressing   Problem: Education: Goal: Knowledge of General Education information will improve Description: Including pain rating scale, medication(s)/side effects and non-pharmacologic comfort measures Outcome: Progressing   Problem: Clinical Measurements: Goal: Will remain free from infection Outcome: Progressing Goal: Diagnostic test results will improve Outcome: Progressing   Problem: Activity: Goal: Risk for activity intolerance will decrease Outcome: Progressing   Problem: Coping: Goal: Level of anxiety will decrease Outcome: Progressing

## 2023-07-15 NOTE — Progress Notes (Signed)
Patient ID: Leroy Chen, male   DOB: [birth year], 67 y.o.   MRN: [id number] Patient is seen in follow-up status post right quad tendon repair.  Patient states he had an episode a few days ago where he felt a pop in his knee give way.  Examination patient can do a straight leg raise without extensor lag.  There is no palpable defect and no signs of infection.  I will place an order for a Bledsoe brace locked in extension and patient may discharge with this brace.

## 2023-07-15 NOTE — Progress Notes (Signed)
Orthopedic Tech Progress Note Patient Details:  STRUMMER POLMAN 19-Mar-1956 [id number]  Patient ID: [name], male   DOB: [birth year], 67 y.o.   MRN: [id number] Bledsoe brace ordered from [facility]. Darleen Crocker 07/15/2023, 12:26 PM

## 2023-07-16 DIAGNOSIS — L03115 Cellulitis of right lower limb: Secondary | ICD-10-CM | POA: Diagnosis not present

## 2023-07-16 LAB — CBC WITH DIFFERENTIAL/PLATELET
Abs Immature Granulocytes: 0.17 10*3/uL — ABNORMAL HIGH (ref 0.00–0.07)
Basophils Absolute: 0.1 10*3/uL (ref 0.0–0.1)
Basophils Relative: 1 %
Eosinophils Absolute: 0.1 10*3/uL (ref 0.0–0.5)
Eosinophils Relative: 1 %
HCT: 29.7 % — ABNORMAL LOW (ref 39.0–52.0)
Hemoglobin: 9 g/dL — ABNORMAL LOW (ref 13.0–17.0)
Immature Granulocytes: 2 %
Lymphocytes Relative: 21 %
Lymphs Abs: 1.7 10*3/uL (ref 0.7–4.0)
MCH: 29.8 pg (ref 26.0–34.0)
MCHC: 30.3 g/dL (ref 30.0–36.0)
MCV: 98.3 fL (ref 80.0–100.0)
Monocytes Absolute: 1 10*3/uL (ref 0.1–1.0)
Monocytes Relative: 13 %
Neutro Abs: 5.1 10*3/uL (ref 1.7–7.7)
Neutrophils Relative %: 62 %
Platelets: 324 10*3/uL (ref 150–400)
RBC: 3.02 MIL/uL — ABNORMAL LOW (ref 4.22–5.81)
RDW: 18.2 % — ABNORMAL HIGH (ref 11.5–15.5)
WBC: 8.1 10*3/uL (ref 4.0–10.5)
nRBC: 2.2 % — ABNORMAL HIGH (ref 0.0–0.2)

## 2023-07-16 LAB — COMPREHENSIVE METABOLIC PANEL
ALT: 219 U/L — ABNORMAL HIGH (ref 0–44)
AST: 103 U/L — ABNORMAL HIGH (ref 15–41)
Albumin: 2.7 g/dL — ABNORMAL LOW (ref 3.5–5.0)
Alkaline Phosphatase: 447 U/L — ABNORMAL HIGH (ref 38–126)
Anion gap: 11 (ref 5–15)
BUN: 24 mg/dL — ABNORMAL HIGH (ref 8–23)
CO2: 21 mmol/L — ABNORMAL LOW (ref 22–32)
Calcium: 8.6 mg/dL — ABNORMAL LOW (ref 8.9–10.3)
Chloride: 104 mmol/L (ref 98–111)
Creatinine, Ser: 1.72 mg/dL — ABNORMAL HIGH (ref 0.61–1.24)
GFR, Estimated: 43 mL/min — ABNORMAL LOW (ref >60)
Glucose, Bld: 193 mg/dL — ABNORMAL HIGH (ref 70–99)
Potassium: 3.9 mmol/L (ref 3.5–5.1)
Sodium: 136 mmol/L (ref 135–145)
Total Bilirubin: 3.2 mg/dL — ABNORMAL HIGH (ref <1.2)
Total Protein: 6.9 g/dL (ref 6.5–8.1)

## 2023-07-16 LAB — GLUCOSE, CAPILLARY
Glucose-Capillary: 158 mg/dL — ABNORMAL HIGH (ref 70–99)
Glucose-Capillary: 175 mg/dL — ABNORMAL HIGH (ref 70–99)
Glucose-Capillary: 238 mg/dL — ABNORMAL HIGH (ref 70–99)

## 2023-07-16 LAB — PHOSPHORUS: Phosphorus: 3.4 mg/dL (ref 2.5–4.6)

## 2023-07-16 LAB — MAGNESIUM: Magnesium: 2.2 mg/dL (ref 1.7–2.4)

## 2023-07-16 MED ORDER — OXYCODONE HCL 5 MG PO TABS: 5.0000 mg | ORAL_TABLET | ORAL | 0 refills | Status: DC | PRN

## 2023-07-16 MED ORDER — METRONIDAZOLE 500 MG PO TABS: 500.0000 mg | ORAL_TABLET | Freq: Two times a day (BID) | ORAL | 0 refills | Status: AC

## 2023-07-16 MED ORDER — CEFDINIR 300 MG PO CAPS: 300.0000 mg | ORAL_CAPSULE | Freq: Two times a day (BID) | ORAL | 0 refills | Status: AC

## 2023-07-16 NOTE — Discharge Instructions (Addendum)
[facility] LAPAROSCOPIC SURGERY: POST OP INSTRUCTIONS  Always review your discharge instruction sheet given to you by the facility where your surgery was performed. IF YOU HAVE DISABILITY OR FAMILY LEAVE FORMS, YOU MUST BRING THEM TO THE OFFICE FOR PROCESSING.   DO NOT GIVE THEM TO YOUR DOCTOR.  A prescription for pain medication may be given to you upon discharge.  Take your pain medication as prescribed, if needed.  If narcotic pain medicine is not needed, then you may take acetaminophen (Tylenol), naprosyn (Alleve), or ibuprofen (Advil) as needed. Take your usually prescribed medications unless otherwise directed. If you need a refill on your pain medication, please contact your pharmacy.  They will contact our office to request authorization. Prescriptions will not be filled after 5pm or on week-ends. You should follow a light diet the first few days after arrival home, such as soup and crackers, etc.  Be sure to include lots of fluids daily. Most patients will experience some swelling and bruising in the area of the incisions.  Ice packs will help.  Swelling and bruising can take several days to resolve.  It is common to experience some constipation if taking pain medication after surgery.  Increasing fluid intake and taking a stool softener (such as Colace) will usually help or prevent this problem from occurring.  A mild laxative (Milk of Magnesia or Miralax) should be taken according to package instructions if there are no bowel movements after 48 hours. Unless discharge instructions indicate otherwise, you may remove your bandages 48 hours after surgery, and you may shower at that time.  You may have steri-strips (small skin tapes) in place directly over the incision.  These strips should be left on the skin for 7-10 days.  If your surgeon used skin glue on the incision, you may shower in 24 hours.  The glue will flake off over the next  2-3 weeks.  Any sutures or staples will be removed at the office during your follow-up visit. ACTIVITIES:  You may resume regular (light) daily activities beginning the next day--such as daily self-care, walking, climbing stairs--gradually increasing activities as tolerated.  You may have sexual intercourse when it is comfortable.  Refrain from any heavy lifting or straining until approved by your doctor. You may drive when you are no longer taking prescription pain medication, you can comfortably wear a seatbelt, and you can safely maneuver your car and apply brakes. RETURN TO WORK:  __________________________________________________________ Bonita Quin should see your doctor in the office for a follow-up appointment approximately 2-3 weeks after your surgery.  Make sure that you call for this appointment within a day or two after you arrive home to insure a convenient appointment time. OTHER INSTRUCTIONS: __________________________________________________________________________________________________________________________ __________________________________________________________________________________________________________________________ WHEN TO CALL YOUR DOCTOR: Fever over 101.0 Inability to urinate Continued bleeding from incision. Increased pain, redness, or drainage from the incision. Increasing abdominal pain  The clinic staff is available to answer your questions during regular business hours.  Please don't hesitate to call and ask to speak to one of the nurses for clinical concerns.  If you have a medical emergency, go to the nearest emergency room or call 911.  A surgeon from [facility] is always on call at the hospital. [address], [place], [place]  [address] ? [address] [address], [place], [place]   [address] [phone] ? [phone] ? FAX [phone] Web site: [email or web address]   _______________________________________________ Information  on my medicine - XARELTO (Rivaroxaban)  This  medication education was reviewed with me or my healthcare representative as part of my discharge preparation.    Why was Xarelto prescribed for you? Xarelto was prescribed for you to reduce the risk of a blood clot forming that can cause a stroke if you have a medical condition called atrial fibrillation (a type of irregular heartbeat).  What do you need to know about xarelto ? Take your Xarelto ONCE DAILY at the same time every day with your evening meal. If you have difficulty swallowing the tablet whole, you may crush it and mix in applesauce just prior to taking your dose.  Take Xarelto exactly as prescribed by your doctor and DO NOT stop taking Xarelto without talking to the doctor who prescribed the medication.  Stopping without other stroke prevention medication to take the place of Xarelto may increase your risk of developing a clot that causes a stroke.  Refill your prescription before you run out.  After discharge, you should have regular check-up appointments with your healthcare provider that is prescribing your Xarelto.  In the future your dose may need to be changed if your kidney function or weight changes by a significant amount.  What do you do if you miss a dose? If you are taking Xarelto ONCE DAILY and you miss a dose, take it as soon as you remember on the same day then continue your regularly scheduled once daily regimen the next day. Do not take two doses of Xarelto at the same time or on the same day.   Important Safety Information A possible side effect of Xarelto is bleeding. You should call your healthcare provider right away if you experience any of the following: Bleeding from an injury or your nose that does not stop. Unusual colored urine (red or dark brown) or unusual colored stools (red or black). Unusual bruising for unknown reasons. A serious fall or if you hit your head (even if there is no  bleeding).  Some medicines may interact with Xarelto and might increase your risk of bleeding while on Xarelto. To help avoid this, consult your healthcare provider or pharmacist prior to using any new prescription or non-prescription medications, including herbals, vitamins, non-steroidal anti-inflammatory drugs (NSAIDs) and supplements.  This website has more information on Xarelto: VisitDestination.com.br.

## 2023-07-16 NOTE — Progress Notes (Signed)
Mobility Specialist Progress Note;   07/16/23 1008  Mobility  Activity Ambulated with assistance in hallway  Level of Assistance Contact guard assist, steadying assist  Assistive Device Front wheel walker  Distance Ambulated (ft) 115 ft  RLE Weight Bearing Per Provider Order WBAT  Activity Response Tolerated well  Mobility Referral Yes  Mobility visit 1 Mobility  Mobility Specialist Start Time (ACUTE ONLY) 1008  Mobility Specialist Stop Time (ACUTE ONLY) 1016  Mobility Specialist Time Calculation (min) (ACUTE ONLY) 8 min   Pt agreeable to mobility. Required MinG assistance during ambulation for safety. No c/o during session when asked. Pt returned back to chair with all needs met.   Caesar Bookman Mobility Specialist Please contact via SecureChat or [employer] [phone]

## 2023-07-16 NOTE — TOC Transition Note (Signed)
Transition of Care [facility]) - Discharge Note   Patient Details  Name: Leroy Chen MRN: [id number] Date of Birth: 1956-03-15  Transition of [facility]) CM/SW Contact:  Gordy Clement, RN Phone Number: 07/16/2023, 11:37 AM   Clinical Narrative:     Patient will DC to home today. [facility] and  the Rolling walker has been delivered bedside . Family will transport home .   No additional TOC needs           Patient Goals and CMS Choice            Discharge Placement                       Discharge Plan and Services Additional resources added to the After Visit Summary for                                       Social Drivers of Health (SDOH) Interventions SDOH Screenings   Food Insecurity: No Food Insecurity (07/03/2023)  Housing: Low Risk  (07/03/2023)  Transportation Needs: No Transportation Needs (07/03/2023)  Utilities: Not At Risk (07/03/2023)  Social Connections: Unknown (11/25/2021)   Received from [facility]  Tobacco Use: Medium Risk (07/14/2023)     Readmission Risk Interventions     No data to display

## 2023-07-16 NOTE — Progress Notes (Signed)
2 Days Post-Op   Subjective/Chief Complaint: Doing fine   Objective: Vital signs in last 24 hours: Temp:  [98 F (36.7 C)-98.3 F (36.8 C)] 98.3 F (36.8 C) (12/30 0800) Pulse Rate:  [78-86] 78 (12/30 0000) Resp:  [16-18] 17 (12/30 0800) BP: (110-143)/(57-72) 110/60 (12/30 0800) SpO2:  [92 %-95 %] 92 % (12/30 0000) Last BM Date : 07/14/23  Intake/Output from previous day: 12/29 0701 - 12/30 0700 In: -  Out: 830 [Urine:800; Drains:30] Intake/Output this shift: No intake/output data recorded.   Abd: soft, ND, appropriately tender to palpation, drain with SS drainage, 30cc over last 24h  Lab Results:  Recent Labs    07/15/23 0353 07/16/23 0427  WBC 9.8 8.1  HGB 8.7* 9.0*  HCT 28.5* 29.7*  PLT 362 324   BMET Recent Labs    07/15/23 0353 07/16/23 0427  NA 133* 136  K 4.8 3.9  CL 104 104  CO2 20* 21*  GLUCOSE 234* 193*  BUN 23 24*  CREATININE 1.47* 1.72*  CALCIUM 8.6* 8.6*   PT/INR No results for input(s): "LABPROT", "INR" in the last 72 hours. ABG No results for input(s): "PHART", "HCO3" in the last 72 hours.  Invalid input(s): "PCO2", "PO2"  Studies/Results: DG Knee 3 Views Right Result Date: 07/14/2023 CLINICAL DATA:  Recurrent right knee pain after surgery EXAM: RIGHT KNEE - 3 VIEW COMPARISON:  07/04/2023 FINDINGS: Frontal, oblique, lateral, and sunrise views of the right knee are obtained. No acute fracture, subluxation, or dislocation. Mild 3 compartmental osteoarthritis. No joint effusion. There is diffuse soft tissue edema within the distal thigh and anterior knee, consistent with recent history of quadriceps tendon repair. Stable 6 mm linear metallic foreign body within the superficial soft tissues of the anterolateral distal right thigh. IMPRESSION: 1. No acute bony abnormality. 2. Diffuse soft tissue swelling throughout the distal thigh and anterior knee, consistent with recent history of trauma and subsequent quadriceps tendon repair. Electronically  Signed   By: Sharlet Salina M.D.   On: 07/14/2023 14:52    Anti-infectives: Anti-infectives (From admission, onward)    Start     Dose/Rate Route Frequency Ordered Stop   07/15/23 1315  cefdinir (OMNICEF) capsule 300 mg        300 mg Oral Every 12 hours 07/15/23 1228     07/15/23 1315  metroNIDAZOLE (FLAGYL) tablet 500 mg        500 mg Oral Every 12 hours 07/15/23 1228     07/10/23 1215  cefTRIAXone (ROCEPHIN) 2 g in sodium chloride 0.9 % 100 mL IVPB  Status:  Discontinued        2 g 200 mL/hr over 30 Minutes Intravenous Every 24 hours 07/10/23 1127 07/15/23 1228   07/10/23 1215  metroNIDAZOLE (FLAGYL) IVPB 500 mg  Status:  Discontinued        500 mg 100 mL/hr over 60 Minutes Intravenous Every 12 hours 07/10/23 1127 07/15/23 1228   07/06/23 2200  ceFAZolin (ANCEF) IVPB 2g/100 mL premix        2 g 200 mL/hr over 30 Minutes Intravenous Every 6 hours 07/06/23 1800 07/07/23 1332   07/06/23 1200  ceFAZolin (ANCEF) IVPB 2g/100 mL premix        2 g 200 mL/hr over 30 Minutes Intravenous On call to O.R. 07/06/23 1112 07/06/23 1614   07/03/23 2200  vancomycin (VANCOREADY) IVPB 1250 mg/250 mL  Status:  Discontinued        1,250 mg 166.7 mL/hr over 90 Minutes Intravenous Every 24  hours 07/03/23 0549 07/05/23 0852   07/03/23 0345  ceFEPIme (MAXIPIME) 2 g in sodium chloride 0.9 % 100 mL IVPB  Status:  Discontinued        2 g 200 mL/hr over 30 Minutes Intravenous Every 12 hours 07/03/23 0357 07/05/23 0852   07/02/23 2130  Vancomycin (VANCOCIN) 1,500 mg in sodium chloride 0.9 % 500 mL IVPB       Placed in "Followed by" Linked Group   1,500 mg 250 mL/hr over 120 Minutes Intravenous  Once 07/02/23 2013 07/03/23 0112   07/02/23 2030  vancomycin (VANCOCIN) IVPB 1000 mg/200 mL premix       Placed in "Followed by" Linked Group   1,000 mg 200 mL/hr over 60 Minutes Intravenous  Once 07/02/23 2013 07/02/23 2304   07/02/23 2015  ceFEPIme (MAXIPIME) 2 g in sodium chloride 0.9 % 100 mL IVPB        2 g 200  mL/hr over 30 Minutes Intravenous  Once 07/02/23 2003 07/02/23 2145       Assessment/Plan: Cholelithiasis, choledocholithisis Hyperbilirubinemia with transaminitis Lap chole 12/28 - Continue drain to bulb suction - Regular diet - Continue antibiotics, ok to switch to PO - can dc home today    Emelia Loron 07/16/2023

## 2023-07-16 NOTE — Anesthesia Postprocedure Evaluation (Signed)
Anesthesia Post Note  Patient: Leroy Chen  Procedure(s) Performed: ENDOSCOPIC RETROGRADE CHOLANGIOPANCREATOGRAPHY (ERCP) SPHINCTEROTOMY REMOVAL OF STONES     Anesthesia Type: General Anesthetic complications: no   No notable events documented.  Last Vitals:  Vitals:   07/16/23 0000 07/16/23 0800  BP: (!) 135/57 110/60  Pulse: 78   Resp: 17 17  Temp:  36.8 C  SpO2: 92%     Last Pain:  Vitals:   07/16/23 0800  TempSrc: Oral  PainSc:                  Mariann Barter

## 2023-07-16 NOTE — Progress Notes (Signed)
Physical Therapy Treatment Patient Details Name: Leroy Chen MRN: [id number] DOB: 1955-12-31 Today's Date: 07/16/2023   History of Present Illness 67 y.o. male presenting [facility] 07/[facility]/23 for RLE pain and swelling after a fall awaiting quadriceps tendon surgery. Patient was recently seen in the ED on 06/27/23 for fall and right leg injury.  X-ray of the femur showed no evidence of fracture. S/p R quad tendon repair 12/20. Pt developed RUQ pain 12/24, found to have gangrenous cholecystitis and is s/p ERCP 12/26 and laparoscopic cholecystectomy on 12/28. Past medical history significant of paroxysmal A-fib status post DCCV in 2021 on Xarelto, history of first and second-degree AV block not requiring pacemaker placement, atrial tachycardia, DM II, CKD stage IIIa, HTN, HLD, former smoker, obesity, and depression.    PT Comments  Pt walking with mobility specialist upon arrival, reports feeling much better and ready for d/c home. Noted new order for hinged knee brace locked in extension, but brace not present in pt's room. Ortho tech brought the brace, pt educated on how to don/doff brace and position appropriately. Education on stairs reinforced verbally as pt declined additional practice in new brace. He remains able to complete sit-stand transfers with supervision and good safety, and is ambulating increased distances with good stability and use of RW. Pt educated on exercises for LE muscle activation and circulation, has already set up appt with HHPT. No further questions or concerns from acute PT standpoint, pt safe for d/c home with family support.    If plan is discharge home, recommend the following: A little help with walking and/or transfers;A little help with bathing/dressing/bathroom;Assistance with cooking/housework;Assist for transportation;Help with stairs or ramp for entrance   Can travel by private vehicle        Equipment Recommendations  Rolling walker (2 wheels)     Recommendations for Other Services       Precautions / Restrictions Precautions Precautions: Fall Required Braces or Orthoses: Other Brace Knee Immobilizer - Right: On at all times Other Brace: hinged knee brace in extension Restrictions Weight Bearing Restrictions Per Provider Order: Yes RLE Weight Bearing Per Provider Order: Weight bearing as tolerated Other Position/Activity Restrictions: WBAT RLE in hinged knee brace locked in extension     Mobility  Bed Mobility               General bed mobility comments: pt OOB at start and end of session    Transfers Overall transfer level: Needs assistance Equipment used: Rolling walker (2 wheels) Transfers: Sit to/from Stand Sit to Stand: Supervision           General transfer comment: supervision with use of BUE support, no instability in L knee. able to maintain R knee in extension    Ambulation/Gait Ambulation/Gait assistance: Supervision Gait Distance (Feet): 40 Feet ((had just finished walking with mobility specialist)) Assistive device: Rolling walker (2 wheels) Gait Pattern/deviations: Decreased stride length, Step-through pattern (Knee locked in extension) Gait velocity: decreased Gait velocity interpretation: <1.31 ft/sec, indicative of household ambulator   General Gait Details: good stability with BUE support and RLE in hinged knee brace, pt with less knee buckling. reports leg feels more stable in hinged brace   Stairs Stairs:  (declined, but verbally discussed with good recall of prior training. Also discussed with pt's wife regarding technique and use of DME)               Balance Overall balance assessment: Needs assistance Sitting-balance support: Feet supported, No upper extremity supported  Sitting balance-Leahy Scale: Good Sitting balance - Comments: sitting EOB   Standing balance support: Bilateral upper extremity supported, During functional activity, Reliant on assistive device for  balance Standing balance-Leahy Scale: Fair Standing balance comment: able to static stand withotu UE support, BUE support for gait                            Cognition Arousal: Alert Behavior During Therapy: WFL for tasks assessed/performed Overall Cognitive Status: Within Functional Limits for tasks assessed                                          Exercises General Exercises - Lower Extremity Ankle Circles/Pumps: AROM, Right, 15 reps Quad Sets: AROM, Right, 10 reps Straight Leg Raises: AROM, Right, 10 reps    General Comments General comments (skin integrity, edema, etc.): VSS on RA, education on donning and doffing hinged knee brace      Pertinent Vitals/Pain Pain Assessment Pain Assessment: 0-10 Pain Score: 6  Pain Location: R knee Pain Descriptors / Indicators: Aching, Discomfort, Guarding, Grimacing Pain Intervention(s): Limited activity within patient's tolerance, Monitored during session, Repositioned     PT Goals (current goals can now be found in the care plan section) Acute Rehab PT Goals Patient Stated Goal: to go home PT Goal Formulation: With patient Time For Goal Achievement: 07/21/23 Potential to Achieve Goals: Good Progress towards PT goals: Progressing toward goals    Frequency    Min 1X/week       AM-PAC PT "6 Clicks" Mobility   Outcome Measure  Help needed turning from your back to your side while in a flat bed without using bedrails?: A Little Help needed moving from lying on your back to sitting on the side of a flat bed without using bedrails?: A Little Help needed moving to and from a bed to a chair (including a wheelchair)?: A Little Help needed standing up from a chair using your arms (e.g., wheelchair or bedside chair)?: A Little Help needed to walk in hospital room?: A Little Help needed climbing 3-5 steps with a railing? : A Little 6 Click Score: 18    End of Session Equipment Utilized During  Treatment: Gait belt Activity Tolerance: Patient tolerated treatment well Patient left: in bed;with call bell/phone within reach Nurse Communication: Mobility status PT Visit Diagnosis: Other abnormalities of gait and mobility (R26.89)     Time: 1610-9604 PT Time Calculation (min) (ACUTE ONLY): 28 min  Charges:    $Gait Training: 8-22 mins $Self Care/Home Management: 8-22 PT General Charges $$ ACUTE PT VISIT: 1 Visit                     Vickki Muff, PT, DPT   Acute Rehabilitation Department Office [phone] Secure Chat Communication Preferred   Ronnie Derby 07/16/2023, 11:03 AM

## 2023-07-16 NOTE — Discharge Summary (Signed)
Physician Discharge Summary   Patient: Leroy Chen MRN: [id number] DOB: 07-07-1956  Admit date:     07/02/2023  Discharge date: 07/16/23  Discharge Physician: Tyrone Nine   PCP: [name]   Recommendations at discharge:  Follow up with PCP in 1-2 weeks. Note jardiance stopped with history of euglycemic DKA. Continued on insulin and other medications as prescribed PTA. Follow up with orthopedic surgeon Dr. Lajoyce Corners in the next 1-2 weeks for postoperative check following quadriceps tendon repair 12/20. HH-PT ordered. Follow up with general surgery, [facility] office to call patient for appointment in 2 weeks. Discharged with JP drain (placed at time of lap cholecystectomy 12/28), plan to complete antibiotics orally.   Discharge Diagnoses: Principal Problem:   Cellulitis of right lower extremity Active Problems:   Unspecified atrial fibrillation (HCC)   Severe sepsis (HCC)   Hyponatremia   Acute kidney injury superimposed on chronic kidney disease (HCC)   Sinus tachycardia   Normocytic anemia   Metabolic acidosis   Elevated bilirubin   Quadriceps tendon rupture, right, initial encounter   Abnormal finding on GI tract imaging   Dilated bile duct   Elevated liver enzymes   Anticoagulated on heparin   Elevated LFTs   Calculus of gallbladder with acute cholecystitis and obstruction  Brief Narrative (HPI from H&P)    67 y.o. male with medical history significant of paroxysmal A-fib status post DCCV in 2021 on Xarelto, history of first and second-degree AV block not requiring pacemaker placement, atrial tachycardia, insulin-dependent type 2 diabetes, CKD stage IIIa, hypertension, hyperlipidemia, former smoker, obesity, hidradenitis suppurativa, depression.  Patient was recently seen in the ED on 06/27/23 for fall and right leg injury.  X-ray of the femur showed no evidence of fracture.  His pain was felt to be related to right quadriceps tendon injury.  He was made nonweightbearing  on crutches, knee immobilizer placed, and was discharged with plan for orthopedic follow-up.    Continue to have swelling of his right leg with right thigh pain and discomfort came to the ER where CT scan showed very large amount of fluid collection in his right lower extremity in the thigh area, he was admitted for further care.  Further workup suggest that he might have a very large hematoma in his right thigh due to being on Xarelto and having a soft tissue injury.  He was found to have a right quadriceps tendon tear which was repaired surgically. Postoperative course was complicated by development of cholecystitis/choledocholithiasis for which ERCP w/sphincterotomy and subsequent laparoscopic cholecystectomy were performed.   Assessment and Plan: Right lower extremity soft tissue injury with fall 1 week ago with right lower extremity swelling and possible secondary cellulitis and foreign body did on CT scan.  He was diagnosed with severe sepsis at the time of admission as well though this is felt unlikely. - Orthopedics was consulted, MRI of the right leg obtained with large quadriceps tear and patellar injury and soft tissue hematoma, now s/p quad tendon repair 12/20 by Dr. Lajoyce Corners. WBAT in knee immobilizer, f/u in office in 2 weeks.  - Of note patient was doing well but on 07/14/2023 heard some pop in his knee and is having some discomfort. XR showed no acute osseous abnormality, though there is diffuse soft tissue swelling as anticipated. Dr. Lajoyce Corners informed and reevaluated, no extensor lag and otherwise doing well so still ok to discharge with follow up planned in a Bledsoe brace.    Acute cholecystitis with choledocholithiasis, obstructive  jaundice: HIDA scan suspicious for cholecystitis, underwent ERCP 12/26 where there was CBD sludge it was removed and sphincterotomy was done, LFTs have improved, laparoscopic cholecystectomy and drain placement performed 07/14/2023. - Continue abx, transition to  omnicef/po flagyl, will complete short course per discussion with surgery. - [facility] to call pt to arrange follow up.    Normocytic anemia - Hemoglobin currently 11.1, was 16.5 in July 2024.  Patient is chronically anticoagulated although denies any symptoms of GI bleed.  Acute anemia is clearly due to soft tissue hematoma and blood loss in the right lower extremity.   - No signs of GI bleed continue PPI.   - Continue to monitor, DOAC held temporarily, hgb stable, will restart.    AKI on CKD stage IIIa - Likely prerenal in etiology in the setting of blood loss, improving.   Paroxysmal A-fib status post DCCV in 2021 - History of atrial tachycardia: Rate is controlled   - Continue  metoprolol, and flecainide.   - Restart xarelto   HTN: Restart home meds 12/30.   HLD: Ok to restart crestor     Depression  Continue Zoloft.   GERD  Continue Protonix.   Mild hyponatremia  Gentle hydration   High anion gap metabolic acidosis  - he takes Jardiance and had developed euglycemic DKA which has resolved after insulin drip, transition to subcu insulin and sliding scale, discontinued Jardiance permanently upon discharge.    Weakness and deconditioning.  PT OT, ambulate in the hallway, sit in chair in the daytime.  - HHPT to be arranged at discharge.   Insulin-dependent type 2 diabetes -on long-acting insulin twice a day along with sliding scale.  Consultants: Orthopedic surgery, GI, general surgery Procedures performed:  Date Procedure Surgeon  07/06/23 REPAIR RIGHT QUADRICEP TENDON Nadara Mustard, MD  07/12/23 ENDOSCOPIC RETROGRADE CHOLANGIOPANCREATOGRAPHY (ERCP) Meryl Dare, MD  07/14/23 LAPAROSCOPIC CHOLECYSTECTOMY Diamantina Monks, MD   Disposition: Home Diet recommendation:  Cardiac and Carb modified diet DISCHARGE MEDICATION: Allergies as of 07/16/2023       Reactions   Penicillins Swelling        Medication List     STOP taking these medications    empagliflozin 25 MG  Tabs tablet Commonly known as: JARDIANCE   HYDROcodone-acetaminophen 5-325 MG tablet Commonly known as: NORCO/VICODIN       TAKE these medications    adalimumab 40 MG/0.4ML pen Commonly known as: HUMIRA Inject 40 mg into the skin once a week. Mondays   albuterol 108 (90 Base) MCG/ACT inhaler Commonly known as: VENTOLIN HFA Inhale 1-2 puffs into the lungs every 6 (six) hours as needed for wheezing or shortness of breath.   cefdinir 300 MG capsule Commonly known as: OMNICEF Take 1 capsule (300 mg total) by mouth every 12 (twelve) hours for 3 days.   chlorhexidine 4 % external liquid Commonly known as: HIBICLENS Apply 1 application topically See admin instructions. Apply to armpits & Groin 3 times a week in the shower. Leave on for 1 minute and then rinse   clindamycin 1 % lotion Commonly known as: CLEOCIN T Apply 1 application topically 2 (two) times daily. Draining bumps   clobetasol 0.05 % external solution Commonly known as: TEMOVATE Apply 1 application topically 2 (two) times daily as needed (infection on scalp).   dicyclomine 10 MG capsule Commonly known as: BENTYL Take 10 mg by mouth 3 (three) times daily as needed for spasms.   ergocalciferol 1.25 MG (50000 UT) capsule Commonly known as: VITAMIN  D2 Take 50,000 Units by mouth once a week. Mondays   flecainide 50 MG tablet Commonly known as: TAMBOCOR Take 50 mg by mouth 2 (two) times daily.   Fluocinolone Acetonide 0.01 % Oil Place 1 application in ear(s) 2 (two) times daily as needed (scaling).   insulin glargine 100 UNIT/ML Solostar Pen Commonly known as: LANTUS Inject 58 Units into the skin every evening.   losartan 100 MG tablet Commonly known as: COZAAR Take 50 mg by mouth daily.   melatonin 3 MG Tabs tablet Take 3 mg by mouth at bedtime as needed.   metFORMIN 500 MG 24 hr tablet Commonly known as: GLUCOPHAGE-XR Take 500 mg by mouth 2 (two) times daily with a meal.   metoprolol tartrate 50 MG  tablet Commonly known as: LOPRESSOR Take by mouth.   metroNIDAZOLE 500 MG tablet Commonly known as: FLAGYL Take 1 tablet (500 mg total) by mouth every 12 (twelve) hours for 3 days.   multivitamin tablet Take 1 tablet by mouth daily.   omeprazole 20 MG capsule Commonly known as: PRILOSEC Take 20 mg by mouth 2 (two) times daily as needed (heartburn).   oxyCODONE 5 MG immediate release tablet Commonly known as: Oxy IR/ROXICODONE Take 1 tablet (5 mg total) by mouth every 4 (four) hours as needed for moderate pain (pain score 4-6).   OZEMPIC (1 MG/DOSE) [place] Inject 1 mg into the skin once a week. Mondays   Semaglutide (2 MG/DOSE) 8 MG/3ML Sopn Inject 2 mg into the skin once a week. Inject on Friday   pantoprazole 40 MG tablet Commonly known as: PROTONIX Take 40 mg by mouth daily.   rivaroxaban 20 MG Tabs tablet Commonly known as: XARELTO Take 20 mg by mouth daily.   rosuvastatin 40 MG tablet Commonly known as: CRESTOR Take 40 mg by mouth at bedtime.   sertraline 100 MG tablet Commonly known as: ZOLOFT Take 100 mg by mouth daily.   Testosterone 1.62 % Gel Place 3 Pump onto the skin daily. Apply to upper arm or shoulder areas only        Follow-up Information     Nadara Mustard, MD Follow up in 2 week(s).   Specialty: Orthopedic Surgery Contact information: [address] [place] [address] [phone]         Diamantina Monks, MD Follow up in 2 week(s).   Specialty: Surgery Why: we will call with appt Contact information: [address] [facility] [place] [address] [phone]         [facility] up.   Contact information: [address] [facility] [place] [address] [phone]                Discharge Exam: Filed Weights   07/02/23 1239 07/14/23 1523  Weight: 113.4 kg 113.4 kg  BP 110/60 (BP Location: Right Arm)   Pulse 78   Temp 98.3 F (36.8 C) (Oral)   Resp  17   Ht 6' 0.99" (1.854 m)   Wt 113.4 kg   SpO2 92%   BMI 32.99 kg/m   Gen: No distress Pulm: Clear, nonlabored  CV: Reg, rate in 80's, no MRG, trace edema GI: Soft, appropriately tender diffusely, nondistended, no rebound, +BS.  Neuro: Alert and oriented. No new focal deficits. Ext: Warm, dry, NVI. Able to raise right leg off bed with reasonable effort and pain and no extensor lag. Skin: JP drain site c/d/i Lap sites c/d/i with dermabond RLE in brace,  neurovascularly intact distally  Condition at discharge: stable  The results of significant diagnostics from this hospitalization (including imaging, microbiology, ancillary and laboratory) are listed below for reference.   Imaging Studies: DG Knee 3 Views Right Result Date: 07/14/2023 CLINICAL DATA:  Recurrent right knee pain after surgery EXAM: RIGHT KNEE - 3 VIEW COMPARISON:  07/04/2023 FINDINGS: Frontal, oblique, lateral, and sunrise views of the right knee are obtained. No acute fracture, subluxation, or dislocation. Mild 3 compartmental osteoarthritis. No joint effusion. There is diffuse soft tissue edema within the distal thigh and anterior knee, consistent with recent history of quadriceps tendon repair. Stable 6 mm linear metallic foreign body within the superficial soft tissues of the anterolateral distal right thigh. IMPRESSION: 1. No acute bony abnormality. 2. Diffuse soft tissue swelling throughout the distal thigh and anterior knee, consistent with recent history of trauma and subsequent quadriceps tendon repair. Electronically Signed   By: Sharlet Salina M.D.   On: 07/14/2023 14:52   US RENAL Result Date: 07/12/2023 CLINICAL DATA:  Acute kidney injury EXAM: RENAL / URINARY TRACT ULTRASOUND COMPLETE COMPARISON:  CT 07/10/2023 FINDINGS: Right Kidney: Renal measurements: 10.4 x 5.8 x 4.8 cm = volume: 152 mL. Echogenicity within normal limits. No mass or hydronephrosis visualized. Left Kidney: Renal measurements: 11.3 x 5.6 x 4.9 cm  = volume: 163 mL. Echogenicity within normal limits. No mass or hydronephrosis visualized. Bladder: Appears normal for degree of bladder distention. Other: Penile prosthesis noted to the left of the bladder. IMPRESSION: No acute findings. Electronically Signed   By: Charlett Nose M.D.   On: 07/12/2023 23:21   DG ERCP Result Date: 07/12/2023 CLINICAL DATA:  Choledocholithiasis EXAM: ERCP TECHNIQUE: Multiple spot images obtained with the fluoroscopic device and submitted for interpretation post-procedure. FLUOROSCOPY: Radiation Exposure Index (as provided by the fluoroscopic device): 38.83 mGy Kerma COMPARISON:  MRCP 07/10/2023 FINDINGS: A total of 25 saved images are submitted for review. The images demonstrate a flexible duodenal scope in the descending duodenum with wire cannulation of the common bile duct. Cholangiogram demonstrates nonocclusive filling defect in the distal common bile duct. Subsequent images confirm sphincterotomy and balloon sweeping of the common duct. IMPRESSION: 1. Choledocholithiasis. 2. ERCP with sphincterotomy and balloon sweeping of the common duct. These images were submitted for radiologic interpretation only. Please see the procedural report for the amount of contrast and the fluoroscopy time utilized. Electronically Signed   By: Malachy Moan M.D.   On: 07/12/2023 16:53   NM Hepatobiliary Liver Func Result Date: 07/12/2023 CLINICAL DATA:  Right upper quadrant abdominal pain EXAM: NUCLEAR MEDICINE HEPATOBILIARY IMAGING TECHNIQUE: Sequential images of the abdomen were obtained out to 60 minutes following intravenous administration of radiopharmaceutical. RADIOPHARMACEUTICALS:  9.8 mCi Tc-16m  Choletec IV (initial) 1.5 millicurie Tc 99 Choletec (reinjection immediately prior to morphine administration) 3.0 mg IV morphine COMPARISON:  MRI 09/09/2022 FINDINGS: Prolonged blood pool activity may suggest some degree of hepatocellular dysfunction. Biliary activity is visible by 10  minutes with bowel activity at 25 minutes. Over the initial 60 minutes, gallbladder activity was not observed. Subsequently, 3.0 mg of IV morphine was administered to the patient. After this, imaging over 30 minutes demonstrated continued bowel activity but no gallbladder activity. IMPRESSION: 1. Non filling of the gallbladder even following morphine administration. Appearance favors obstructed cystic duct and acute cholecystitis. 2. Patent common bile duct. 3. Prolonged blood pool activity raising suspicion for mild hepatocellular dysfunction. Electronically Signed   By: Gaylyn Rong M.D.   On: 07/12/2023 12:36  MR 3D Recon At Scanner Result Date: 07/10/2023 : CLINICAL DATA: Jaundice, right upper quadrant abdominal pain, gallstones, indeterminate liver lesion EXAM: MRI ABDOMEN WITHOUT AND WITH CONTRAST (INCLUDING MRCP) TECHNIQUE: Multiplanar multisequence MR imaging of the abdomen was performed both before and after the administration of intravenous contrast. Heavily T2-weighted images of the biliary and pancreatic ducts were obtained, and three-dimensional MRCP images were rendered by post processing. CONTRAST: 10mL GADAVIST GADOBUTROL 1 MMOL/ML IV SOLN COMPARISON: Same-day CT abdomen pelvis CT abdomen pelvis, 11/10/2020 FINDINGS: Lower chest: No acute abnormality. Hepatobiliary: Heterogeneously rim enhancing lesion in the posterior right lobe of the liver measuring 2.2 x 1.4 cm (series 1401, image 77), with adjacent small heterogeneously enhancing areas of T2 hyperintensity posteriorly. Small volume of layering sludge in the gallbladder (series 8, image 20). Severe gallbladder wall thickening and trace pericholecystic fluid. Central common bile duct is dilated up to 1.0 cm in the pancreatic head, however the duct tapers smoothly to the ampulla without calculus or other evidence of obstruction (series 2, image 18). Pancreas: Unremarkable. No pancreatic ductal dilatation or surrounding inflammatory  changes. Spleen: Normal in size without significant abnormality. Adrenals/Urinary Tract: Adrenal glands are unremarkable. Kidneys are normal, without renal calculi, solid lesion, or hydronephrosis. Stomach/Bowel: Stomach is within normal limits. No evidence of bowel wall thickening, distention, or inflammatory changes. Vascular/Lymphatic: Aortic atherosclerosis. No enlarged abdominal lymph nodes. Other: No abdominal wall hernia or abnormality. No ascites. Musculoskeletal: No acute or significant osseous findings. IMPRESSION: 1. Small volume of layering sludge in the gallbladder. Severe gallbladder wall thickening and trace pericholecystic fluid. Findings are consistent with acute cholecystitis. 2. Heterogeneously rim enhancing lesion in the posterior right lobe of the liver measuring 2.2 x 1.4 cm, with adjacent small heterogeneously enhancing areas of T2 hyperintensity posteriorly. Findings are most consistent with hepatic abscess in this clinical setting. Malignancy remains a differential consideration and at minimum, recommend follow-up imaging in 3 months following acute clinical presentation to ensure complete resolution. 3. Central common bile duct is dilated up to 1.0 cm in the pancreatic head, however the duct tapers smoothly to the ampulla without calculus or other evidence of obstruction. Aortic Atherosclerosis (ICD10-I70.0). Electronically Signed   By: Jearld Lesch M.D.   On: 07/10/2023 19:46   MR ABDOMEN MRCP W WO CONTAST Result Date: 07/10/2023 CLINICAL DATA:  Jaundice, right upper quadrant abdominal pain, gallstones, indeterminate liver lesion EXAM: MRI ABDOMEN WITHOUT AND WITH CONTRAST (INCLUDING MRCP) TECHNIQUE: Multiplanar multisequence MR imaging of the abdomen was performed both before and after the administration of intravenous contrast. Heavily T2-weighted images of the biliary and pancreatic ducts were obtained, and three-dimensional MRCP images were rendered by post processing. CONTRAST:   10mL GADAVIST GADOBUTROL 1 MMOL/ML IV SOLN COMPARISON:  Same-day CT abdomen pelvis CT abdomen pelvis, 11/10/2020 FINDINGS: Lower chest: No acute abnormality. Hepatobiliary: Heterogeneously rim enhancing lesion in the posterior right lobe of the liver measuring 2.2 x 1.4 cm (series 1401, image 77), with adjacent small heterogeneously enhancing areas of T2 hyperintensity posteriorly. Small volume of layering sludge in the gallbladder (series 8, image 20). Severe gallbladder wall thickening and trace pericholecystic fluid. Central common bile duct is dilated up to 1.0 cm in the pancreatic head, however the duct tapers smoothly to the ampulla without calculus or other evidence of obstruction (series 2, image 18). Pancreas: Unremarkable. No pancreatic ductal dilatation or surrounding inflammatory changes. Spleen: Normal in size without significant abnormality. Adrenals/Urinary Tract: Adrenal glands are unremarkable. Kidneys are normal, without renal calculi, solid lesion, or hydronephrosis. Stomach/Bowel:  Stomach is within normal limits. No evidence of bowel wall thickening, distention, or inflammatory changes. Vascular/Lymphatic: Aortic atherosclerosis. No enlarged abdominal lymph nodes. Other: No abdominal wall hernia or abnormality. No ascites. Musculoskeletal: No acute or significant osseous findings. IMPRESSION: 1. Small volume of layering sludge in the gallbladder. Severe gallbladder wall thickening and trace pericholecystic fluid. Findings are consistent with acute cholecystitis. 2. Heterogeneously rim enhancing lesion in the posterior right lobe of the liver measuring 2.2 x 1.4 cm, with adjacent small heterogeneously enhancing areas of T2 hyperintensity posteriorly. Findings are most consistent with hepatic abscess in this clinical setting. Malignancy remains a differential consideration and at minimum, recommend follow-up imaging in 3 months following acute clinical presentation to ensure complete resolution. 3.  Central common bile duct is dilated up to 1.0 cm in the pancreatic head, however the duct tapers smoothly to the ampulla without calculus or other evidence of obstruction. Aortic Atherosclerosis (ICD10-I70.0). Electronically Signed   By: Jearld Lesch M.D.   On: 07/10/2023 19:42   DG Abd Portable 1V Result Date: 07/10/2023 CLINICAL DATA:  Upper abdominal pain. EXAM: PORTABLE ABDOMEN - 1 VIEW COMPARISON:  04/23/2019. FINDINGS: The bowel gas pattern is normal. No radio-opaque calculi or other significant radiographic abnormality are seen. IMPRESSION: Negative. Electronically Signed   By: Layla Maw M.D.   On: 07/10/2023 09:43   CT ABDOMEN PELVIS W CONTRAST Result Date: 07/10/2023 CLINICAL DATA:  Right upper quadrant pain. EXAM: CT ABDOMEN AND PELVIS WITH CONTRAST TECHNIQUE: Multidetector CT imaging of the abdomen and pelvis was performed using the standard protocol following bolus administration of intravenous contrast. RADIATION DOSE REDUCTION: This exam was performed according to the departmental dose-optimization program which includes automated exposure control, adjustment of the mA and/or kV according to patient size and/or use of iterative reconstruction technique. CONTRAST:  75mL OMNIPAQUE IOHEXOL 350 MG/ML SOLN COMPARISON:  11/10/2020 and 10/12/2014. FINDINGS: Lower chest: Linear bibasilar subsegmental atelectasis no or scarring. No pleural or pericardial effusion. Coronary atheromatous calcifications. Hepatobiliary: Hepatic lesion right lobe measuring 2.8 cm is indeterminate and new compared to old studies. This lesion can be assessed further with liver protocol CT or MRI. No intrahepatic ductal dilatation. Gallbladder is distended with pericholecystic inflammatory changes and numerous small calcified stones. The appearance is suggestive of acute cholecystitis. This can be confirmed with a HIDA scan, if indicated. Pancreas: Unremarkable. No pancreatic ductal dilatation or surrounding  inflammatory changes. Spleen: Normal in size without focal abnormality. Adrenals/Urinary Tract: Right adrenal nodule measuring 2 cm is unchanged. No hydronephrosis or nephrolithiasis. Unremarkable urinary bladder. Stomach/Bowel: No bowel dilatation to suggest obstruction. No inflammatory mucosal process of the bowel to limits of CT evaluation. Diverticula in the descending and sigmoid colon. No evidence of diverticulitis. Vascular/Lymphatic: No significant vascular findings are present. No enlarged abdominal or pelvic lymph nodes. Reproductive: Prominent prostate measures 6.3 x 5.7 x 4.7 cm. Left-sided reservoir for penile prosthesis noted. Other: Small periumbilical abdominal wall hernia containing omental fat without evidence of incarceration or herniated bowel. No abdominopelvic ascites. Musculoskeletal: Degenerative changes with vacuum discs at L1-2-3. IMPRESSION: 1. Findings suggestive of acute cholecystitis. 2. Indeterminate hepatic lesion. This can be assessed further with liver protocol CT or MRI. 3. Stable right adrenal nodule. 4. Diverticulosis without diverticulitis. 5. Prostatomegaly. Electronically Signed   By: Layla Maw M.D.   On: 07/10/2023 09:42   MR KNEE RIGHT WO CONTRAST Result Date: 07/04/2023 CLINICAL DATA:  Knee trauma, internal derangement suspected. Reported fall 1 week ago. EXAM: MRI OF THE RIGHT KNEE WITHOUT CONTRAST  TECHNIQUE: Multiplanar, multisequence MR imaging of the knee was performed. No intravenous contrast was administered. COMPARISON:  Radiographs 06/27/2023. CT of the right femur 07/02/2023. FINDINGS: MENISCI Medial meniscus:  Intact with normal morphology. Lateral meniscus:  Intact with normal morphology. LIGAMENTS Cruciates: The anterior cruciate ligament appears mildly thickened and ill-defined without definite discontinuity, findings favored to reflect mucoid degeneration. The PCL appears normal. Collaterals: The medial and lateral collateral ligament complexes are  intact. CARTILAGE Patellofemoral: Mild fissuring of the patellar cartilage at the apex, best seen on the sagittal images. No subchondral signal abnormality. Medial:  No focal chondral defect. Lateral:  No focal chondral defect. MISCELLANEOUS Joint: Small joint effusion without lipohemarthrosis or intra-articular loose body. Popliteal Fossa: The popliteus muscle and tendon are intact. Small Baker's cyst. Extensor Mechanism: There is a large amount of heterogeneous soft tissue swelling anteriorly in the distal thigh which likely represents soft tissue hematoma. There is evidence of an underlying high-grade partial tear of the distal quadriceps tendon, involving the superficial central fibers arising from the rectus femoris tendon as well as some of the medial fibers arising from the vastus medialis. The deep central and lateral fibers appear intact. The patellar tendon appears lax, but intact. Possible partial tear of the medial patellofemoral ligament at its patellar attachment. Bones:  No evidence of acute fracture, dislocation or osteonecrosis. Other: Diffuse soft tissue swelling around the knee progressive from recent CT. As above, there is heterogeneous probable hemorrhage around the distal quadriceps tendon, incompletely visualized. IMPRESSION: 1. High-grade partial tear of the distal quadriceps tendon, involving the superficial central fibers arising from the rectus femoris tendon as well as some of the medial fibers arising from the vastus medialis. The deep central and lateral fibers appear intact. 2. Large amount of heterogeneous soft tissue swelling anteriorly in the distal thigh, likely representing soft tissue hematoma. 3. Possible partial tear of the medial patellofemoral ligament at its patellar attachment. 4. The menisci, cruciate and collateral ligaments appear intact. 5. No evidence of acute fracture or osteonecrosis. Electronically Signed   By: Carey Bullocks M.D.   On: 07/04/2023 17:11   VAS [place]  LOWER EXTREMITY VENOUS (DVT) Result Date: 07/03/2023  Lower Venous DVT Study Patient Name:  ABDELAZIZ BURTS  Date of Exam:   07/03/2023 Medical Rec #: [id number]         Accession #:    [id number] Date of Birth: 10/08/1955          Patient Gender: M Patient Age:   67 years Exam Location:  [facility] Procedure:      VAS [place] LOWER EXTREMITY VENOUS (DVT) Referring Phys: Ulyess Blossom RATHORE --------------------------------------------------------------------------------  Indications: Pain, and Swelling. Other Indications: Recent fall w/ right leg injury on 06/27/2023. Anticoagulation: Xarelto (afib). Limitations: Poor ultrasound/tissue interface. Comparison Study: Previous exam on 06/02/2015 was negative for DVT. Performing Technologist: [name] RVT, RDMS  Examination Guidelines: A complete evaluation includes B-mode imaging, spectral Doppler, color Doppler, and power Doppler as needed of all accessible portions of each vessel. Bilateral testing is considered an integral part of a complete examination. Limited examinations for reoccurring indications may be performed as noted. The reflux portion of the exam is performed with the patient in reverse Trendelenburg.  +---------+---------------+---------+-----------+----------+-------------------+ RIGHT    CompressibilityPhasicitySpontaneityPropertiesThrombus Aging      +---------+---------------+---------+-----------+----------+-------------------+ CFV      Full           Yes      Yes                                      +---------+---------------+---------+-----------+----------+-------------------+  SFJ      Full                                                             +---------+---------------+---------+-----------+----------+-------------------+ FV Prox  Full           Yes      Yes                                      +---------+---------------+---------+-----------+----------+-------------------+ FV Mid   Full           Yes       Yes                                      +---------+---------------+---------+-----------+----------+-------------------+ FV DistalFull           Yes      Yes                                      +---------+---------------+---------+-----------+----------+-------------------+ PFV      Full                                                             +---------+---------------+---------+-----------+----------+-------------------+ POP      Full           Yes      Yes                                      +---------+---------------+---------+-----------+----------+-------------------+ PTV      Full                                                             +---------+---------------+---------+-----------+----------+-------------------+ PERO     Full                                         Not well visualized +---------+---------------+---------+-----------+----------+-------------------+   +----+---------------+---------+-----------+----------+--------------+ LEFTCompressibilityPhasicitySpontaneityPropertiesThrombus Aging +----+---------------+---------+-----------+----------+--------------+ CFV Full           Yes      Yes                                 +----+---------------+---------+-----------+----------+--------------+     Summary: RIGHT: - There is no evidence of deep vein thrombosis in the lower extremity.  - No cystic structure found in the popliteal fossa. - Ultrasound characteristics of enlarged lymph nodes are noted in the groin. Subcutaneous edema noted throughout lower extremity.  LEFT: -  No evidence of common femoral vein obstruction.   *See table(s) above for measurements and observations. Electronically signed by Lemar Livings MD on 07/03/2023 at 1:38:07 PM.    Final    CT FEMUR RIGHT W CONTRAST Result Date: 07/03/2023 CLINICAL DATA:  Soft tissue infection suspected. EXAM: CT OF THE LOWER RIGHT EXTREMITY WITH CONTRAST TECHNIQUE: Multidetector CT  imaging of the lower right extremity was performed according to the standard protocol following intravenous contrast administration. RADIATION DOSE REDUCTION: This exam was performed according to the departmental dose-optimization program which includes automated exposure control, adjustment of the mA and/or kV according to patient size and/or use of iterative reconstruction technique. CONTRAST:  60mL OMNIPAQUE IOHEXOL 350 MG/ML SOLN COMPARISON:  Right femur x-ray 06/27/2023 FINDINGS: Bones/Joint/Cartilage There is no acute fracture or dislocation. No cortical erosions are seen. There is no significant knee or hip joint effusion identified. There is a rounded sclerotic focus in the left femoral neck measuring 6 mm which may represent a bone island. Ligaments Suboptimally assessed by CT. Muscles and Tendons There is no intramuscular fluid collection. Deep fascial planes are preserved. There is an intramuscular lipoma in the adductor musculature measuring 3.0 x 1.6 x 2.6 cm. Soft tissues There is subcutaneous edema throughout the mid and lower thigh surrounding the knee. There is no evidence for soft tissue gas or definitive fluid collection. There is a thin linear radiopaque foreign body measuring 1 cm within the lateral subcutaneous tissues of the distal right lower extremity best seen on image 3/203. IMPRESSION: 1. Subcutaneous edema throughout the mid and lower thigh surrounding the knee. No evidence for soft tissue gas or fluid collection. 2. No acute osseous abnormality. 3. Thin linear radiopaque foreign body measuring 1 cm within the lateral subcutaneous tissues of the distal right lower extremity. Electronically Signed   By: Darliss Cheney M.D.   On: 07/03/2023 00:19   CT HIP RIGHT WO CONTRAST Result Date: 07/02/2023 CLINICAL DATA:  Hip trauma, fracture suspected, xray done. EXAM: CT OF THE RIGHT HIP WITHOUT CONTRAST TECHNIQUE: Multidetector CT imaging of the right hip was performed according to the  standard protocol. Multiplanar CT image reconstructions were also generated. RADIATION DOSE REDUCTION: This exam was performed according to the departmental dose-optimization program which includes automated exposure control, adjustment of the mA and/or kV according to patient size and/or use of iterative reconstruction technique. COMPARISON:  None Available. FINDINGS: Bones/Joint/Cartilage No acute fracture or dislocation. No aggressive osseous lesion. No significant degenerative changes of right hip joint. Probable mild chronic right sacroiliitis. Ligaments Suboptimally assessed by CT. Muscles and Tendons Suboptimally assessed by CT scan.  No gross abnormality seen. Soft tissues Unremarkable urinary bladder. No bowel obstruction. Normal appendix. Partially seen penile prosthesis. IMPRESSION: *No acute osseous injury of the right hip. *Multiple other nonacute observations, as described above. Electronically Signed   By: Jules Schick M.D.   On: 07/02/2023 17:30   DG Chest 1 View Result Date: 07/02/2023 CLINICAL DATA:  Cough.  Right leg redness EXAM: CHEST  1 VIEW COMPARISON:  02/12/2023 FINDINGS: AP upright radiograph demonstrates midline trachea. Normal heart size for level of inspiration. Mild left hemidiaphragm elevation. No pleural effusion or pneumothorax. Clear lungs. IMPRESSION: No active disease. Electronically Signed   By: Jeronimo Greaves M.D.   On: 07/02/2023 15:19   CT Head Wo Contrast Result Date: 06/27/2023 CLINICAL DATA:  Fall injury on blood thinners, denies having hit his head. EXAM: CT HEAD WITHOUT CONTRAST TECHNIQUE: Contiguous axial images were obtained from the base of  the skull through the vertex without intravenous contrast. RADIATION DOSE REDUCTION: This exam was performed according to the departmental dose-optimization program which includes automated exposure control, adjustment of the mA and/or kV according to patient size and/or use of iterative reconstruction technique. COMPARISON:   MRI brain without contrast 11/12/2020 FINDINGS: Brain: There is mild atrophy and small-vessel disease. The ventricles are normal in size and position. There is a chronic cortical infarct in the inferior left frontal lobe again noted most likely due to remote trauma. No new abnormality or suspicious for a cortical based infarct, hemorrhage, mass or mass effect is seen. Basal cisterns are clear. Vascular: There are scattered calcifications of the carotid siphons. No hyperdense central vessel is seen. Skull: Negative for fracture or focal lesions. There is chronic multifocal heterogeneous soft tissue thickening with dystrophic calcifications in the occipital scalp, scattered small areas of heterogeneity in the subcutaneous scalp elsewhere with additional cutaneous calcifications. This was seen as decreased T2 and intermediate T1 signal on the prior MRI and is not notably changed. Findings may reflect residua of remote scalp hematomas. Correlation with physical exam findings is recommended. Sinuses/Orbits: No acute finding. Other: None. IMPRESSION: 1. No acute intracranial CT findings or interval changes. 2. Atrophy and small-vessel disease. 3. 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 4. Chronic multifocal heterogeneous soft tissue thickening with dystrophic calcifications in the occipital scalp, scattered small areas of heterogeneity in the subcutaneous scalp elsewhere with additional cutaneous calcifications. Findings may reflect residua of remote scalp hematomas. Clinical correlation recommended. Electronically Signed   By: Almira Bar M.D.   On: 06/27/2023 21:40   DG FEMUR, MIN 2 VIEWS RIGHT Result Date: 06/27/2023 CLINICAL DATA:  Fall, right upper leg pain EXAM: RIGHT FEMUR 2 VIEWS COMPARISON:  None Available. FINDINGS: There is no evidence of fracture or other focal bone lesions. Soft tissues are unremarkable. IMPRESSION: Negative. Electronically Signed   By:  Charlett Nose M.D.   On: 06/27/2023 20:21    Microbiology: Results for orders placed or performed during the hospital encounter of 07/02/23  Resp panel by RT-PCR (RSV, Flu A&B, Covid) Anterior Nasal Swab     Status: None   Collection Time: 07/02/23  4:59 PM   Specimen: Anterior Nasal Swab  Result Value Ref Range Status   SARS Coronavirus 2 by RT PCR NEGATIVE NEGATIVE Final   Influenza A by PCR NEGATIVE NEGATIVE Final   Influenza B by PCR NEGATIVE NEGATIVE Final    Comment: (NOTE) The Xpert Xpress SARS-CoV-2/FLU/RSV plus assay is intended as an aid in the diagnosis of influenza from Nasopharyngeal swab specimens and should not be used as a sole basis for treatment. Nasal washings and aspirates are unacceptable for Xpert Xpress SARS-CoV-2/FLU/RSV testing.  Fact Sheet for Patients: BloggerCourse.com  Fact Sheet for Healthcare Providers: SeriousBroker.it  This test is not yet approved or cleared by the [place] FDA and has been authorized for detection and/or diagnosis of SARS-CoV-2 by FDA under an Emergency Use Authorization (EUA). This EUA will remain in effect (meaning this test can be used) for the duration of the COVID-19 declaration under Section 564(b)(1) of the Act, 21 U.S.C. section 360bbb-3(b)(1), unless the authorization is terminated or revoked.     Resp Syncytial Virus by PCR NEGATIVE NEGATIVE Final    Comment: (NOTE) Fact Sheet for Patients: BloggerCourse.com  Fact Sheet for Healthcare Providers: SeriousBroker.it  This test is not yet approved or cleared by the [place] FDA and has been authorized for detection and/or  diagnosis of SARS-CoV-2 by FDA under an Emergency Use Authorization (EUA). This EUA will remain in effect (meaning this test can be used) for the duration of the COVID-19 declaration under Section 564(b)(1) of the Act, 21 U.S.C. section  360bbb-3(b)(1), unless the authorization is terminated or revoked.  Performed at [facility], [address] [place], [place] [address]   Blood culture (routine x 2)     Status: None   Collection Time: 07/02/23  8:24 PM   Specimen: BLOOD RIGHT HAND  Result Value Ref Range Status   Specimen Description BLOOD RIGHT HAND  Final   Special Requests   Final    BOTTLES DRAWN AEROBIC ONLY Blood Culture results may not be optimal due to an inadequate volume of blood received in culture bottles   Culture   Final    NO GROWTH 5 DAYS Performed at [facility], [address], [place], [place] [address]    Report Status 07/07/2023 FINAL  Final  Blood culture (routine x 2)     Status: None   Collection Time: 07/02/23  8:35 PM   Specimen: BLOOD  Result Value Ref Range Status   Specimen Description BLOOD SITE NOT SPECIFIED  Final   Special Requests   Final    BOTTLES DRAWN AEROBIC ONLY Blood Culture results may not be optimal due to an inadequate volume of blood received in culture bottles   Culture   Final    NO GROWTH 5 DAYS Performed at [facility], [address] [place], [place] [address]    Report Status 07/07/2023 FINAL  Final  MRSA Next Gen by PCR, Nasal     Status: None   Collection Time: 07/04/23 11:24 AM   Specimen: Nasal Mucosa; Nasal Swab  Result Value Ref Range Status   MRSA by PCR Next Gen NOT DETECTED NOT DETECTED Final    Comment: (NOTE) The GeneXpert MRSA Assay (FDA approved for NASAL specimens only), is one component of a comprehensive MRSA colonization surveillance program. It is not intended to diagnose MRSA infection nor to guide or monitor treatment for MRSA infections. Test performance is not FDA approved in patients less than 27 years old. Performed at [facility], [address] [place], [place] [address]   Surgical PCR screen     Status: None   Collection Time: 07/06/23  8:23 AM   Specimen: Nasal Mucosa; Nasal Swab  Result Value  Ref Range Status   MRSA, PCR NEGATIVE NEGATIVE Final   Staphylococcus aureus NEGATIVE NEGATIVE Final    Comment: (NOTE) The Xpert SA Assay (FDA approved for NASAL specimens in patients 33 years of age and older), is one component of a comprehensive surveillance program. It is not intended to diagnose infection nor to guide or monitor treatment. Performed at [facility], [address], [place], [place] [address]     Labs: CBC: Recent Labs  Lab 07/12/23 [phone] 07/13/23 0529 07/14/23 0340 07/15/23 0353 07/16/23 0427  WBC 7.6 8.8 8.3 9.8 8.1  NEUTROABS 5.0 6.8 6.1 7.7 5.1  HGB 8.7* 8.3* 8.2* 8.7* 9.0*  HCT 27.5* 27.1* 26.1* 28.5* 29.7*  MCV 95.8 94.8 94.6 95.6 98.3  PLT 351 369 374 362 324   Basic Metabolic Panel: Recent Labs  Lab 07/12/23 0508 07/13/23 0529 07/14/23 0340 07/15/23 0353 07/16/23 0427  NA 137 136 135 133* 136  K 4.1 4.2 3.7 4.8 3.9  CL 104 106 105 104 104  CO2 21* 19*  23 20* 21*  GLUCOSE 154* 199* 165* 234* 193*  BUN 16 24* 22 23 24*  CREATININE 1.83* 1.84* 1.68* 1.47* 1.72*  CALCIUM 8.6* 8.7* 8.5* 8.6* 8.6*  MG 2.5* 2.7* 2.2 2.3 2.2  PHOS  --   --   --  4.1 3.4   Liver Function Tests: Recent Labs  Lab 07/12/23 0508 07/13/23 0529 07/14/23 0340 07/15/23 0353 07/16/23 0427  AST 1,002* 330* 245* 172* 103*  ALT 706* 482* 350* 294* 219*  ALKPHOS 558* 563* 573* 539* 447*  BILITOT 18.6* 9.1* 5.8* 3.7* 3.2*  PROT 7.0 7.3 7.3 7.1 6.9  ALBUMIN 2.4* 2.8* 2.6* 2.7* 2.7*   CBG: Recent Labs  Lab 07/15/23 1111 07/15/23 1655 07/16/23 0003 07/16/23 0515 07/16/23 0757  GLUCAP 212* 167* 238* 175* 158*    Discharge time spent: greater than 30 minutes.  Signed: Tyrone Nine, MD [facility] 07/16/2023

## 2023-07-17 LAB — SURGICAL PATHOLOGY

## 2023-07-25 ENCOUNTER — Ambulatory Visit (INDEPENDENT_AMBULATORY_CARE_PROVIDER_SITE_OTHER): Payer: Non-veteran care | Admitting: Family

## 2023-07-25 ENCOUNTER — Encounter: Payer: Self-pay | Admitting: Family

## 2023-07-25 DIAGNOSIS — S76111D Strain of right quadriceps muscle, fascia and tendon, subsequent encounter: Secondary | ICD-10-CM

## 2023-07-25 MED ORDER — OXYCODONE HCL 5 MG PO TABS: 5.0000 mg | ORAL_TABLET | Freq: Three times a day (TID) | ORAL | 0 refills | Status: AC | PRN

## 2023-07-25 NOTE — Progress Notes (Signed)
 Post-Op Visit Note   Patient: Leroy Chen           Date of Birth: 1956/01/20           MRN: [id number] Visit Date: 07/25/2023 PCP: [facility]  Chief Complaint:  Chief Complaint  Patient presents with   Right Leg - Routine Post Op    07/06/23 repair right quadricep tendon    HPI:  HPI The patient is a 68 year old gentleman who is seen status post right quadricep tendon repair he has been Bledsoe brace locked in extension since surgery he has begun home health physical therapy Ortho Exam On examination right knee the anterior incision is well-healed sutures removed today.  There is minimal edema no erythema can do a straight leg raise without extensor lag.  There is no palpable defect  Visit Diagnoses: No diagnosis found.  Plan: Foam dressing applied.  May begin showering.  Continue with walker have opened up his Bledsoe to 90 degrees of flexion he will continue the Bledsoe  Follow-Up Instructions: No follow-ups on file.   Imaging: No results found.  Orders:  No orders of the defined types were placed in this encounter.  No orders of the defined types were placed in this encounter.    PMFS History: Patient Active Problem List   Diagnosis Date Noted   Calculus of gallbladder with acute cholecystitis and obstruction 07/13/2023   Elevated LFTs 07/12/2023   Abnormal finding on GI tract imaging 07/11/2023   Dilated bile duct 07/11/2023   Elevated liver enzymes 07/11/2023   Anticoagulated on heparin  07/11/2023   Quadriceps tendon rupture, right, initial encounter 07/04/2023   Cellulitis of right lower extremity 07/03/2023   Acute kidney injury superimposed on chronic kidney disease (HCC) 07/03/2023   Sinus tachycardia 07/03/2023   Normocytic anemia 07/03/2023   Metabolic acidosis 07/03/2023   Elevated bilirubin 07/03/2023   Heart block 04/21/2021   Ulcerative colitis (HCC) 04/21/2021   Coagulopathy (HCC) 04/21/2021   Hyponatremia 11/12/2020   CKD  (chronic kidney disease), stage III (HCC) 11/12/2020   Diverticulitis large intestine 04/27/2015   Unspecified atrial fibrillation (HCC) 10/17/2014   Smoker 10/17/2014   Demand ischemia from AF with RVR-Troponin 0.07 10/17/2014   Diverticulitis of large intestine with abscess without bleeding 10/15/2014   Diverticulitis of intestine with abscess without bleeding 10/12/2014   Colonic diverticular abscess- s/p colectomy 10/15/14 10/12/2014   Folliculitis 10/12/2014   Severe sepsis (HCC) 10/12/2014   Insulin -requiring or dependent type II diabetes mellitus (HCC) 10/12/2014   Hidradenitis suppurativa 08/02/2012   Past Medical History:  Diagnosis Date   Atrial fibrillation [facility]) March 2015   Diabetes mellitus without complication (HCC)    NIDDM, type II   High cholesterol    Hydradenitis    Sebaceous cyst    hx of   Sigmoid diverticulitis    Wears glasses     Family History  Problem Relation Age of Onset   Healthy Mother     Past Surgical History:  Procedure Laterality Date   CHOLECYSTECTOMY N/A 07/14/2023   Procedure: LAPAROSCOPIC CHOLECYSTECTOMY;  Surgeon: Paola Dreama SAILOR, MD;  Location: [facility] OR;  Service: General;  Laterality: N/A;   COLECTOMY  10/15/14   ERCP N/A 07/12/2023   Procedure: ENDOSCOPIC RETROGRADE CHOLANGIOPANCREATOGRAPHY (ERCP);  Surgeon: Aneita Gwendlyn DASEN, MD;  Location: [facility] ENDOSCOPY;  Service: Gastroenterology;  Laterality: N/A;   hand sugery     ILEO LOOP COLOSTOMY CLOSURE N/A 04/27/2015   Procedure: DIAGNOSTIC LAPAROSCOPY,  EXPLORATORY LAPAROTOMY WITH  RESECTION AND CLOSURE OF COLOSTOMY ;  Surgeon: Elon Pacini, MD;  Location: [facility] OR;  Service: General;  Laterality: N/A;   LAPAROTOMY N/A 10/15/2014   Procedure: EXPLORATORY LAPAROTOMY WITH DRAINAGE OF INTRA- ABDOMINAL ABSCESS;  Surgeon: Elon Pacini, MD;  Location: [facility] OR;  Service: General;  Laterality: N/A;   ORCHIECTOMY     PARTIAL COLECTOMY N/A 10/15/2014   Procedure: SIGMOID COLECTOMY WITH COLOSTOMY CREATION;   Surgeon: Elon Pacini, MD;  Location: [facility] OR;  Service: General;  Laterality: N/A;   PILONIDAL CYST EXCISION N/A 01/01/2015   Procedure: EXCISION OF PILONIDAL DISEASE;  Surgeon: Bernarda Ned, MD;  Location: [facility] [place];  Service: General;  Laterality: N/A;   QUADRICEPS TENDON REPAIR Right 07/06/2023   Procedure: REPAIR RIGHT QUADRICEP TENDON;  Surgeon: Harden Jerona GAILS, MD;  Location: [facility] OR;  Service: Orthopedics;  Laterality: Right;   REMOVAL OF STONES  07/12/2023   Procedure: REMOVAL OF STONES;  Surgeon: Aneita Gwendlyn DASEN, MD;  Location: [facility] ENDOSCOPY;  Service: Gastroenterology;;   ANNETT  07/12/2023   Procedure: SPHINCTEROTOMY;  Surgeon: Aneita Gwendlyn DASEN, MD;  Location: [facility] ENDOSCOPY;  Service: Gastroenterology;;   Social History   Occupational History   Not on file  Tobacco Use   Smoking status: Former    Current packs/day: 0.00    Average packs/day: 0.3 packs/day for 20.0 years (5.0 ttl pk-yrs)    Types: Cigarettes    Start date: 49    Quit date: 2018    Years since quitting: 7.0   Smokeless tobacco: Never  Vaping Use   Vaping status: Never Used  Substance and Sexual Activity   Alcohol use: Yes    Comment: Social- moderate - one beer at the wedding     Drug use: No   Sexual activity: Yes    Birth control/protection: Condom

## 2023-08-09 ENCOUNTER — Encounter: Payer: Non-veteran care | Admitting: Orthopedic Surgery

## 2023-08-09 ENCOUNTER — Ambulatory Visit (INDEPENDENT_AMBULATORY_CARE_PROVIDER_SITE_OTHER): Payer: Non-veteran care | Admitting: Orthopedic Surgery

## 2023-08-09 DIAGNOSIS — S76111D Strain of right quadriceps muscle, fascia and tendon, subsequent encounter: Secondary | ICD-10-CM

## 2023-08-10 ENCOUNTER — Encounter: Payer: Self-pay | Admitting: Orthopedic Surgery

## 2023-08-10 ENCOUNTER — Telehealth: Payer: Self-pay | Admitting: Radiology

## 2023-08-10 NOTE — Progress Notes (Signed)
Office Visit Note   Patient: Leroy Chen           Date of Birth: 1955/08/12           MRN: [id number] Visit Date: 08/09/2023              Requested by: [name] [address] [facility] [place],  [place] [address] PCP: [name]  Chief Complaint  Patient presents with   Right Leg - Routine Post Op    07/06/23 repair right quadricep tendon       HPI: Patient is a 68 year old gentleman who is 4 weeks status post repair quad tendon rupture.  He has been in a Bledsoe brace but he states that the brace has been sliding down his leg and not working.  He is currently going to physical therapy 2 times a week.  Assessment & Plan: Visit Diagnoses:  1. Quadriceps tendon rupture, right, subsequent encounter     Plan: Patient will discontinue the Bledsoe brace recommended he continue using a cane and continue with therapy.  Follow-Up Instructions: Return in about 4 weeks (around 09/06/2023).   Ortho Exam  Patient is alert, oriented, no adenopathy, well-dressed, normal affect, normal respiratory effort. Examination patient has full active extension without pain.  He has range of motion from 0 to 100 degrees.  The incision is well-healed.  Imaging: No results found. No images are attached to the encounter.  Labs: Lab Results  Component Value Date   HGBA1C 7.6 (H) 07/03/2023   HGBA1C 7.2 (H) 04/21/2021   HGBA1C 7.3 (H) 11/13/2020   ESRSEDRATE 70 (H) 10/13/2014   CRP 8.8 (H) 07/14/2023   CRP 16.9 (H) 07/13/2023   CRP 19.9 (H) 07/12/2023   REPTSTATUS 07/07/2023 FINAL 07/02/2023   GRAMSTAIN  11/07/2014    FEW WBC PRESENT, PREDOMINANTLY PMN NO SQUAMOUS EPITHELIAL CELLS SEEN NO ORGANISMS SEEN Performed at [facility]    CULT  07/02/2023    NO GROWTH 5 DAYS Performed at [facility], [address], [place], [place] [address]    [facility] ESCHERICHIA COLI 11/07/2014     Lab Results  Component Value Date   ALBUMIN 2.7  (L) 07/16/2023   ALBUMIN 2.7 (L) 07/15/2023   ALBUMIN 2.6 (L) 07/14/2023    Lab Results  Component Value Date   MG 2.2 07/16/2023   MG 2.3 07/15/2023   MG 2.2 07/14/2023   No results found for: "VD25OH"  No results found for: "PREALBUMIN"    Latest Ref Rng & Units 07/16/2023    4:27 AM 07/15/2023    3:53 AM 07/14/2023    3:40 AM  CBC EXTENDED  WBC 4.0 - 10.5 K/uL 8.1  9.8  8.3   RBC 4.22 - 5.81 MIL/uL 3.02  2.98  2.76   Hemoglobin 13.0 - 17.0 g/dL 9.0  8.7  8.2   HCT 13.2 - 52.0 % 29.7  28.5  26.1   Platelets 150 - 400 K/uL 324  362  374   NEUT# 1.7 - 7.7 K/uL 5.1  7.7  6.1   Lymph# 0.7 - 4.0 K/uL 1.7  1.0  1.3      There is no height or weight on file to calculate BMI.  Orders:  No orders of the defined types were placed in this encounter.  No orders of the defined types were placed in this encounter.    Procedures: No procedures performed  Clinical Data: No additional findings.  ROS:  All other  systems negative, except as noted in the HPI. Review of Systems  Objective: Vital Signs: There were no vitals taken for this visit.  Specialty Comments:  No specialty comments available.  PMFS History: Patient Active Problem List   Diagnosis Date Noted   Calculus of gallbladder with acute cholecystitis and obstruction 07/13/2023   Elevated LFTs 07/12/2023   Abnormal finding on GI tract imaging 07/11/2023   Dilated bile duct 07/11/2023   Elevated liver enzymes 07/11/2023   Anticoagulated on heparin 07/11/2023   Quadriceps tendon rupture, right, initial encounter 07/04/2023   Cellulitis of right lower extremity 07/03/2023   Acute kidney injury superimposed on chronic kidney disease (HCC) 07/03/2023   Sinus tachycardia 07/03/2023   Normocytic anemia 07/03/2023   Metabolic acidosis 07/03/2023   Elevated bilirubin 07/03/2023   Heart block 04/21/2021   Ulcerative colitis (HCC) 04/21/2021   Coagulopathy (HCC) 04/21/2021   Hyponatremia 11/12/2020   CKD  (chronic kidney disease), stage III (HCC) 11/12/2020   Diverticulitis large intestine 04/27/2015   Unspecified atrial fibrillation (HCC) 10/17/2014   Smoker 10/17/2014   Demand ischemia from AF with RVR-Troponin 0.07 10/17/2014   Diverticulitis of large intestine with abscess without bleeding 10/15/2014   Diverticulitis of intestine with abscess without bleeding 10/12/2014   Colonic diverticular abscess- s/p colectomy 10/15/14 10/12/2014   Folliculitis 10/12/2014   Severe sepsis (HCC) 10/12/2014   Insulin-requiring or dependent type II diabetes mellitus (HCC) 10/12/2014   Hidradenitis suppurativa 08/02/2012   Past Medical History:  Diagnosis Date   Atrial fibrillation [facility]) March 2015   Diabetes mellitus without complication (HCC)    NIDDM, type II   High cholesterol    Hydradenitis    Sebaceous cyst    hx of   Sigmoid diverticulitis    Wears glasses     Family History  Problem Relation Age of Onset   Healthy Mother     Past Surgical History:  Procedure Laterality Date   CHOLECYSTECTOMY N/A 07/14/2023   Procedure: LAPAROSCOPIC CHOLECYSTECTOMY;  Surgeon: Diamantina Monks, MD;  Location: [facility] OR;  Service: General;  Laterality: N/A;   COLECTOMY  10/15/14   ERCP N/A 07/12/2023   Procedure: ENDOSCOPIC RETROGRADE CHOLANGIOPANCREATOGRAPHY (ERCP);  Surgeon: Meryl Dare, MD;  Location: [facility] ENDOSCOPY;  Service: Gastroenterology;  Laterality: N/A;   hand sugery     ILEO LOOP COLOSTOMY CLOSURE N/A 04/27/2015   Procedure: DIAGNOSTIC LAPAROSCOPY,  EXPLORATORY LAPAROTOMY WITH RESECTION AND CLOSURE OF COLOSTOMY ;  Surgeon: [name], MD;  Location: [facility] OR;  Service: General;  Laterality: N/A;   LAPAROTOMY N/A 10/15/2014   Procedure: EXPLORATORY LAPAROTOMY WITH DRAINAGE OF INTRA- ABDOMINAL ABSCESS;  Surgeon: [name], MD;  Location: [facility] OR;  Service: General;  Laterality: N/A;   ORCHIECTOMY     PARTIAL COLECTOMY N/A 10/15/2014   Procedure: SIGMOID COLECTOMY WITH COLOSTOMY CREATION;   Surgeon: [name], MD;  [facility];  Service: General;  Laterality: N/A;   PILONIDAL CYST EXCISION N/A 01/01/2015   Procedure: EXCISION OF PILONIDAL DISEASE;  Surgeon: Romie Levee, MD;  Location: [facility];  Service: General;  Laterality: N/A;   QUADRICEPS TENDON REPAIR Right 07/06/2023   Procedure: REPAIR RIGHT QUADRICEP TENDON;  Surgeon: Nadara Mustard, MD;  Location: [facility] OR;  Service: Orthopedics;  Laterality: Right;   REMOVAL OF STONES  07/12/2023   Procedure: REMOVAL OF STONES;  Surgeon: Meryl Dare, MD;  Location: [facility] ENDOSCOPY;  Service: Gastroenterology;;   Dennison Mascot  07/12/2023   Procedure: Dennison Mascot;  Surgeon: Claudette Head  T, MD;  Location: [facility] ENDOSCOPY;  Service: Gastroenterology;;   Social History   Occupational History   Not on file  Tobacco Use   Smoking status: Former    Current packs/day: 0.00    Average packs/day: 0.3 packs/day for 20.0 years (5.0 ttl pk-yrs)    Types: Cigarettes    Start date: 93    Quit date: 2018    Years since quitting: 7.0   Smokeless tobacco: Never  Vaping Use   Vaping status: Never Used  Substance and Sexual Activity   Alcohol use: Yes    Comment: Social- moderate - one beer at the wedding     Drug use: No   Sexual activity: Yes    Birth control/protection: Condom

## 2023-08-10 NOTE — Telephone Encounter (Signed)
Called and lm on vm for  therapist to advise of message below.

## 2023-08-10 NOTE — Telephone Encounter (Signed)
This pt was in the office yesterday and is s/p quad tendon repair. He was in a bledose brace that was locked at 90 and this was d/c'd and restrictions for the pt with therapy or just participation as he can tolerate?

## 2023-08-10 NOTE — Telephone Encounter (Signed)
Received call from Lorenda Hatchet, PT with [facility]. Patient saw Dr. Lajoyce Corners yesterday and had brace removed. Onalee Hua would like to know if there are any restrictions or precautions for PT, is patient WBAT with gradual increase of activities?  Please advise.  CB [id number]

## 2023-09-06 ENCOUNTER — Encounter: Payer: Non-veteran care | Admitting: Orthopedic Surgery

## 2023-09-07 ENCOUNTER — Encounter: Payer: Self-pay | Admitting: Family

## 2023-09-07 ENCOUNTER — Ambulatory Visit: Payer: Non-veteran care | Admitting: Family

## 2023-09-07 DIAGNOSIS — S76111D Strain of right quadriceps muscle, fascia and tendon, subsequent encounter: Secondary | ICD-10-CM

## 2023-09-07 NOTE — Progress Notes (Signed)
 Post-Op Visit Note   Patient: Leroy Chen           Date of Birth: 1955/12/25           MRN: [id number] Visit Date: 09/07/2023 [facility]CP: [name]  Chief Complaint:  Chief Complaint  Patient presents with   Right Leg - Routine Post Op    07/06/23 repair right quadricep tendon    HPI:  HPI The patient is a 68 year old gentleman who presents status post repair of the right quadriceps tendon December 20.  He has been doing quite well he has completed physical therapy and continues on his home exercise program.  He feels intermittent pain about a 3 or 4 out of 10 but this is something he can deal with he does have some stiffness especially after prolonged rest Ortho Exam On examination right leg the incision is well-healed over his knee.  The extensor mechanism is intact.  He has full active extension without pain range of motion from 0 to 110 degrees.  Visit Diagnoses: No diagnosis found.  Plan: He will continue with his cane.  Continue with home exercise program follow-up for any concerns in the future  Follow-Up Instructions: No follow-ups on file.   Imaging: No results found.  Orders:  No orders of the defined types were placed in this encounter.  No orders of the defined types were placed in this encounter.    PMFS History: Patient Active Problem List   Diagnosis Date Noted   Calculus of gallbladder with acute cholecystitis and obstruction 07/13/2023   Elevated LFTs 07/12/2023   Abnormal finding on GI tract imaging 07/11/2023   Dilated bile duct 07/11/2023   Elevated liver enzymes 07/11/2023   Anticoagulated on heparin 07/11/2023   Quadriceps tendon rupture, right, initial encounter 07/04/2023   Cellulitis of right lower extremity 07/03/2023   Acute kidney injury superimposed on chronic kidney disease (HCC) 07/03/2023   Sinus tachycardia 07/03/2023   Normocytic anemia 07/03/2023   Metabolic acidosis 07/03/2023   Elevated bilirubin 07/03/2023    Heart block 04/21/2021   Ulcerative colitis (HCC) 04/21/2021   Coagulopathy (HCC) 04/21/2021   Hyponatremia 11/12/2020   CKD (chronic kidney disease), stage III (HCC) 11/12/2020   Diverticulitis large intestine 04/27/2015   Unspecified atrial fibrillation (HCC) 10/17/2014   Smoker 10/17/2014   Demand ischemia from AF with RVR-Troponin 0.07 10/17/2014   Diverticulitis of large intestine with abscess without bleeding 10/15/2014   Diverticulitis of intestine with abscess without bleeding 10/12/2014   Colonic diverticular abscess- s/p colectomy 10/15/14 10/12/2014   Folliculitis 10/12/2014   Severe sepsis (HCC) 10/12/2014   Insulin-requiring or dependent type II diabetes mellitus (HCC) 10/12/2014   Hidradenitis suppurativa 08/02/2012   Past Medical History:  Diagnosis Date   Atrial fibrillation [facility]) March 2015   Diabetes mellitus without complication (HCC)    NIDDM, type II   High cholesterol    Hydradenitis    Sebaceous cyst    hx of   Sigmoid diverticulitis    Wears glasses     Family History  Problem Relation Age of Onset   Healthy Mother     Past Surgical History:  Procedure Laterality Date   CHOLECYSTECTOMY N/A 07/14/2023   Procedure: LAPAROSCOPIC CHOLECYSTECTOMY;  Surgeon: Diamantina Monks, MD;  [facility];  Service: General;  Laterality: N/A;   COLECTOMY  10/15/14   ERCP N/A 07/12/2023   Procedure: ENDOSCOPIC RETROGRADE CHOLANGIOPANCREATOGRAPHY (ERCP);  Surgeon: Meryl Dare, MD;  Location: [facility] ENDOSCOPY;  Service:  Gastroenterology;  Laterality: N/A;   hand sugery     ILEO LOOP COLOSTOMY CLOSURE N/A 04/27/2015   Procedure: DIAGNOSTIC LAPAROSCOPY,  EXPLORATORY LAPAROTOMY WITH RESECTION AND CLOSURE OF COLOSTOMY ;  Surgeon: [name], MD;  Location[facility];  Service: General;  Laterality: N/A;   LAPAROTOMY N/A 10/15/2014   Procedure: EXPLORATORY LAPAROTOMY WITH DRAINAGE OF INTRA- ABDOMINAL ABSCESS;  Surgeon: [name], MD;  Location[facility];  Service:  General;  Laterality: N/A;   ORCHIECTOMY     PARTIAL COLECTOMY N/A 10/15/2014   Procedure: SIGMOID COLECTOMY WITH COLOSTOMY CREATION;  Surgeon: [name], MD;  [facility];  Service: General;  Laterality: N/A;   PILONIDAL CYST EXCISION N/A 01/01/2015   Procedure: EXCISION OF PILONIDAL DISEASE;  Surgeon: Romie Levee, MD;  Location: [facility];  Service: General;  Laterality: N/A;   QUADRICEPS TENDON REPAIR Right 07/06/2023   Procedure: REPAIR RIGHT QUADRICEP TENDON;  Surgeon: Nadara Mustard, MD;  Location: [facility] OR;  Service: Orthopedics;  Laterality: Right;   REMOVAL OF STONES  07/12/2023   Procedure: REMOVAL OF STONES;  Surgeon: Meryl Dare, MD;  Location: [facility] ENDOSCOPY;  Service: Gastroenterology;;   Dennison Mascot  07/12/2023   Procedure: SPHINCTEROTOMY;  Surgeon: Meryl Dare, MD;  Location: [facility] ENDOSCOPY;  Service: Gastroenterology;;   Social History   Occupational History   Not on file  Tobacco Use   Smoking status: Former    Current packs/day: 0.00    Average packs/day: 0.3 packs/day for 20.0 years (5.0 ttl pk-yrs)    Types: Cigarettes    Start date: 17    Quit date: 2018    Years since quitting: 7.1   Smokeless tobacco: Never  Vaping Use   Vaping status: Never Used  Substance and Sexual Activity   Alcohol use: Yes    Comment: Social- moderate - one beer at the wedding     Drug use: No   Sexual activity: Yes    Birth control/protection: Condom

## 2023-10-03 ENCOUNTER — Encounter (HOSPITAL_COMMUNITY): Payer: Self-pay

## 2023-10-03 ENCOUNTER — Ambulatory Visit (HOSPITAL_COMMUNITY)
Admission: EM | Admit: 2023-10-03 | Discharge: 2023-10-03 | Disposition: A | Discharge status: Home / Self Care | Attending: Emergency Medicine | Admitting: Emergency Medicine

## 2023-10-03 DIAGNOSIS — L02214 Cutaneous abscess of groin: Secondary | ICD-10-CM | POA: Insufficient documentation

## 2023-10-03 DIAGNOSIS — L02411 Cutaneous abscess of right axilla: Secondary | ICD-10-CM | POA: Diagnosis not present

## 2023-10-03 MED ORDER — LIDOCAINE-EPINEPHRINE 1 %-1:100000 IJ SOLN
INTRAMUSCULAR | Status: AC
  Filled 2023-10-03: qty 1

## 2023-10-03 MED ORDER — LIDOCAINE-EPINEPHRINE-TETRACAINE (LET) TOPICAL GEL
TOPICAL | Status: AC
  Filled 2023-10-03: qty 3

## 2023-10-03 MED ORDER — LIDOCAINE-EPINEPHRINE-TETRACAINE (LET) TOPICAL GEL
3.0000 mL | Freq: Once | TOPICAL | Status: AC
  Administered 2023-10-03: 3 mL via TOPICAL

## 2023-10-03 NOTE — Discharge Instructions (Addendum)
 We drained your abscess in your right under arm today.  Continue to use warm compresses and take your doxycycline twice daily as prescribed.  We have sent a wound culture off we will contact you if we need to modify your antibiotic therapy.  Continue doing warm compresses and warm showers to help facilitate drainage.  You can take 500 mg of Tylenol every 6-8 hours as needed for pain.  Return to clinic if you do not have any improvement over the next 3 days.  Seek immediate care for any fever, vomiting, or worsening of your abscess.

## 2023-10-03 NOTE — ED Triage Notes (Addendum)
 Patient c/o right axillary abscess x 1 week.  Patient added that he had an abscess to the lower abdomen that was draining and he noted that he had swelling to the ri eyelid area and right facial swelling.

## 2023-10-03 NOTE — ED Provider Notes (Signed)
 [facility]    CSN: [id number] Arrival date & time: 10/03/23  1202      History   Chief Complaint Chief Complaint  Patient presents with   Abscess    HPI Leroy Chen is a 68 y.o. male.   Patient is to clinic with his spouse over concern of a large axillary abscess that has been present for the past week or so, maybe longer.  At home he has been doing warm compresses, heat and allowing the hot water to run over it in the shower.  He did go to the [place] on Monday and they gave him doxycycline to take twice daily, has been compliant with this.  Feels like the right side of his face is swollen, thinks this is due to how large his abscesses in his right axilla.  He has a spot in his groin that has been draining purulent fluid as well.  Has had hot and cold chills, no documented fever.  History of type 2 diabetes, high cholesterol, and hidradenitis.   The history is provided by the patient, medical records and the spouse.  Abscess   Past Medical History:  Diagnosis Date   Atrial fibrillation [facility]) March 2015   Diabetes mellitus without complication (HCC)    NIDDM, type II   High cholesterol    Hydradenitis    Sebaceous cyst    hx of   Sigmoid diverticulitis    Wears glasses     Patient Active Problem List   Diagnosis Date Noted   Calculus of gallbladder with acute cholecystitis and obstruction 07/13/2023   Elevated LFTs 07/12/2023   Abnormal finding on GI tract imaging 07/11/2023   Dilated bile duct 07/11/2023   Elevated liver enzymes 07/11/2023   Anticoagulated on heparin 07/11/2023   Quadriceps tendon rupture, right, initial encounter 07/04/2023   Cellulitis of right lower extremity 07/03/2023   Acute kidney injury superimposed on chronic kidney disease (HCC) 07/03/2023   Sinus tachycardia 07/03/2023   Normocytic anemia 07/03/2023   Metabolic acidosis 07/03/2023   Elevated bilirubin 07/03/2023   Heart block 04/21/2021   Ulcerative colitis (HCC)  04/21/2021   Coagulopathy (HCC) 04/21/2021   Hyponatremia 11/12/2020   CKD (chronic kidney disease), stage III (HCC) 11/12/2020   Diverticulitis large intestine 04/27/2015   Unspecified atrial fibrillation (HCC) 10/17/2014   Smoker 10/17/2014   Demand ischemia from AF with RVR-Troponin 0.07 10/17/2014   Diverticulitis of large intestine with abscess without bleeding 10/15/2014   Diverticulitis of intestine with abscess without bleeding 10/12/2014   Colonic diverticular abscess- s/p colectomy 10/15/14 10/12/2014   Folliculitis 10/12/2014   Severe sepsis (HCC) 10/12/2014   Insulin-requiring or dependent type II diabetes mellitus (HCC) 10/12/2014   Hidradenitis suppurativa 08/02/2012    Past Surgical History:  Procedure Laterality Date   CHOLECYSTECTOMY N/A 07/14/2023   Procedure: LAPAROSCOPIC CHOLECYSTECTOMY;  Surgeon: Diamantina Monks, MD;  Location: [facility];  Service: General;  Laterality: N/A;   COLECTOMY  10/15/14   ERCP N/A 07/12/2023   Procedure: ENDOSCOPIC RETROGRADE CHOLANGIOPANCREATOGRAPHY (ERCP);  Surgeon: Meryl Dare, MD;  Location: [facility] ENDOSCOPY;  Service: Gastroenterology;  Laterality: N/A;   hand sugery     ILEO LOOP COLOSTOMY CLOSURE N/A 04/27/2015   Procedure: DIAGNOSTIC LAPAROSCOPY,  EXPLORATORY LAPAROTOMY WITH RESECTION AND CLOSURE OF COLOSTOMY ;  Surgeon: [name], MD;  Location: [facility] OR;  Service: General;  Laterality: N/A;   LAPAROTOMY N/A 10/15/2014   Procedure: EXPLORATORY LAPAROTOMY WITH DRAINAGE OF INTRA- ABDOMINAL ABSCESS;  Surgeon:  [name], MD;  Location: [facility] OR;  Service: General;  Laterality: N/A;   ORCHIECTOMY     PARTIAL COLECTOMY N/A 10/15/2014   Procedure: SIGMOID COLECTOMY WITH COLOSTOMY CREATION;  Surgeon: [name], MD;  Location: [facility] OR;  Service: General;  Laterality: N/A;   PILONIDAL CYST EXCISION N/A 01/01/2015   Procedure: EXCISION OF PILONIDAL DISEASE;  Surgeon: Romie Levee, MD;  Location: [facility] [place];  Service:  General;  Laterality: N/A;   QUADRICEPS TENDON REPAIR Right 07/06/2023   Procedure: REPAIR RIGHT QUADRICEP TENDON;  Surgeon: Nadara Mustard, MD;  Location: [facility] OR;  Service: Orthopedics;  Laterality: Right;   REMOVAL OF STONES  07/12/2023   Procedure: REMOVAL OF STONES;  Surgeon: Meryl Dare, MD;  Location: [facility] ENDOSCOPY;  Service: Gastroenterology;;   Dennison Mascot  07/12/2023   Procedure: Dennison Mascot;  Surgeon: Meryl Dare, MD;  Location: [facility] ENDOSCOPY;  Service: Gastroenterology;;       Home Medications    Prior to Admission medications   Medication Sig Start Date End Date Taking? Authorizing Provider  Adalimumab 40 MG/0.4ML PNKT Inject 40 mg into the skin once a week. Mondays    [provider]  albuterol (PROVENTIL HFA;VENTOLIN HFA) 108 (90 Base) MCG/ACT inhaler Inhale 1-2 puffs into the lungs every 6 (six) hours as needed for wheezing or shortness of breath. 08/05/17   Wallis Bamberg, PA-C  Chlorhexidine Gluconate 4 % SOLN Apply 1 application topically See admin instructions. Apply to armpits & Groin 3 times a week in the shower. Leave on for 1 minute and then rinse    [provider]  clindamycin (CLEOCIN T) 1 % lotion Apply 1 application topically 2 (two) times daily. Draining bumps 03/14/19   [provider]  clobetasol (TEMOVATE) 0.05 % external solution Apply 1 application topically 2 (two) times daily as needed (infection on scalp).    [provider]  dicyclomine (BENTYL) 10 MG capsule Take 10 mg by mouth 3 (three) times daily as needed for spasms.    [provider]  ergocalciferol (VITAMIN D2) 1.25 MG (50000 UT) capsule Take 50,000 Units by mouth once a week. Mondays    [provider]  flecainide (TAMBOCOR) 50 MG tablet Take 50 mg by mouth 2 (two) times daily.    [provider]  Fluocinolone Acetonide 0.01 % OIL Place 1 application in ear(s) 2 (two) times daily as needed (scaling).    [provider]  insulin glargine (LANTUS) 100 UNIT/ML Solostar Pen Inject 58 Units into the skin every evening. 04/03/19   [provider]  losartan (COZAAR) 100 MG tablet Take 50 mg by mouth daily. 10/28/22   [provider]  melatonin 3 MG TABS tablet Take 3 mg by mouth at bedtime as needed.    [provider]  metFORMIN (GLUCOPHAGE-XR) 500 MG 24 hr tablet Take 500 mg by mouth 2 (two) times daily with a meal. 01/01/23   [provider]  metoprolol tartrate (LOPRESSOR) 50 MG tablet Take by mouth. 01/08/23   [provider]  Multiple Vitamin (MULTIVITAMIN) tablet Take 1 tablet by mouth daily.    [provider]  omeprazole (PRILOSEC) 20 MG capsule Take 20 mg by mouth 2 (two) times daily as needed (heartburn).    [provider]  oxyCODONE (OXY IR/ROXICODONE) 5 MG immediate release tablet Take 1 tablet (5 mg total) by mouth every 8 (eight) hours as needed for moderate pain (pain score 4-6). 07/25/23   Adonis Huguenin,  NP  pantoprazole (PROTONIX) 40 MG tablet Take 40 mg by mouth daily. 01/08/23   [provider]  rivaroxaban (XARELTO) 20 MG TABS tablet Take 20 mg by mouth daily.    [provider]  rosuvastatin (CRESTOR) 40 MG tablet Take 40 mg by mouth at bedtime.    [provider]  Semaglutide (OZEMPIC, 1 MG/DOSE, [place]) Inject 1 mg into the skin once a week. Mondays    [provider]  Semaglutide, 2 MG/DOSE, 8 MG/3ML SOPN Inject 2 mg into the skin once a week. Inject on Friday 01/17/23   [provider]  sertraline (ZOLOFT) 100 MG tablet Take 100 mg by mouth daily.    [provider]  Testosterone 1.62 % GEL Place 3 Pump onto the skin daily. Apply to upper arm or shoulder areas only    [provider]    Family History Family History  Problem Relation Age of Onset   Healthy Mother     Social History Social History   Tobacco Use   Smoking status: Former    Current packs/day: 0.00     Average packs/day: 0.3 packs/day for 20.0 years (5.0 ttl pk-yrs)    Types: Cigarettes    Start date: 13    Quit date: 2018    Years since quitting: 7.2   Smokeless tobacco: Never  Vaping Use   Vaping status: Never Used  Substance Use Topics   Alcohol use: Not Currently   Drug use: No     Allergies   Penicillins   Review of Systems Review of Systems  Per HPI   Physical Exam Triage Vital Signs ED Triage Vitals  Encounter Vitals Group     BP 10/03/23 1220 134/76     Systolic BP Percentile --      Diastolic BP Percentile --      Pulse Rate 10/03/23 1220 100     Resp 10/03/23 1220 16     Temp 10/03/23 1220 98 F (36.7 C)     Temp Source 10/03/23 1220 Oral     SpO2 10/03/23 1220 95 %     Weight --      Height --      Head Circumference --      Peak Flow --      Pain Score 10/03/23 1218 10     Pain Loc --      Pain Education --      Exclude from Growth Chart --    No data found.  Updated Vital Signs BP 134/76 (BP Location: Left Arm)   Pulse 100   Temp 98 F (36.7 C) (Oral)   Resp 16   SpO2 95%   Visual Acuity Right Eye Distance:   Left Eye Distance:   Bilateral Distance:    Right Eye Near:   Left Eye Near:    Bilateral Near:     Physical Exam Vitals and nursing note reviewed.  Constitutional:      Appearance: Normal appearance.  HENT:     Head: Normocephalic and atraumatic.     Right Ear: External ear normal.     Left Ear: External ear normal.     Nose: Nose normal.     Mouth/Throat:     Mouth: Mucous membranes are moist.  Eyes:     Conjunctiva/sclera: Conjunctivae normal.  Cardiovascular:     Rate and Rhythm: Normal rate.  Pulmonary:     Effort: Pulmonary effort is normal. No respiratory distress.  Breath sounds: No wheezing.  Musculoskeletal:        General: Normal range of motion.  Skin:    General: Skin is warm and dry.     Findings: Abscess present.          Comments: Large 10 cm x 10 cm abscess present to the right axilla  with central area of fluctuance surrounded by induration.  Small abscess draining purulent fluid in the left groin area that is approximately 1 cm x 1 cm.  Neurological:     General: No focal deficit present.     Mental Status: He is alert.  Psychiatric:        Mood and Affect: Mood normal.        Behavior: Behavior is cooperative.      UC Treatments / Results  Labs (all labs ordered are listed, but only abnormal results are displayed) Labs Reviewed  AEROBIC CULTURE W GRAM STAIN (SUPERFICIAL SPECIMEN)    EKG   Radiology No results found.  Procedures Incision and Drainage  Date/Time: 10/03/2023 1:32 PM  Performed by: Rainey Kahrs, [place] N, FNP Authorized by: Roy Snuffer, [place] N, FNP   Consent:    Consent obtained:  Verbal   Consent given by:  Patient   Risks, benefits, and alternatives were discussed: yes     Risks discussed:  Bleeding, incomplete drainage and pain   Alternatives discussed:  No treatment and delayed treatment Universal protocol:    Procedure explained and questions answered to patient or proxy's satisfaction: yes     Patient identity confirmed:  Verbally with patient Location:    Type:  Abscess   Size:  10 cm x 10 cm   Location:  Upper extremity   Upper extremity location: R axilla. Pre-procedure details:    Skin preparation:  Antiseptic wash and povidone-iodine Sedation:    Sedation type:  None Anesthesia:    Anesthesia method:  Topical application and local infiltration   Topical anesthetic:  LET   Local anesthetic:  Lidocaine 1% WITH epi Procedure type:    Complexity:  Complex Procedure details:    Incision types:  Single straight   Wound management:  Probed and deloculated   Drainage:  Purulent and bloody   Drainage amount:  Moderate   Wound treatment:  Wound left open   Packing materials:  None Post-procedure details:    Procedure completion:  Tolerated well, no immediate complications  (including critical care time)  Medications  Ordered in UC Medications  lidocaine-EPINEPHrine-tetracaine (LET) topical gel (3 mLs Topical Given 10/03/23 1253)    Initial Impression / Assessment and Plan / UC Course  I have reviewed the triage vital signs and the nursing notes.  Pertinent labs & imaging results that were available during my care of the patient were reviewed by me and considered in my medical decision making (see chart for details).  Vitals and triage reviewed, patient is hemodynamically stable.  Large abscess to the right axilla with area of fluctuance surrounded by induration.  Incision and drainage completed, see procedure note for further details.  Smaller abscess in the groin area that is not fluctuant nor ready drainage, I&D withheld.  Patient currently on doxycycline for oral antibiotic therapy, continue this.  Wound culture sent.  Pain management, follow-up care return precautions given, no questions at this time.     Final Clinical Impressions(s) / UC Diagnoses   Final diagnoses:  Abscess of axilla, right  Abscess of groin, left     Discharge Instructions  We drained your abscess in your right under arm today.  Continue to use warm compresses and take your doxycycline twice daily as prescribed.  We have sent a wound culture off we will contact you if we need to modify your antibiotic therapy.  Continue doing warm compresses and warm showers to help facilitate drainage.  You can take 500 mg of Tylenol every 6-8 hours as needed for pain.  Return to clinic if you do not have any improvement over the next 3 days.  Seek immediate care for any fever, vomiting, or worsening of your abscess.      ED Prescriptions   None    PDMP not reviewed this encounter.   Yarisbel Miranda, [place] N, [place] 10/03/23 [phone]

## 2023-10-08 LAB — AEROBIC CULTURE W GRAM STAIN (SUPERFICIAL SPECIMEN): Gram Stain: NONE SEEN

## 2024-02-07 ENCOUNTER — Encounter (HOSPITAL_COMMUNITY): Payer: Self-pay

## 2024-02-07 ENCOUNTER — Encounter (HOSPITAL_COMMUNITY): Payer: Self-pay | Admitting: *Deleted

## 2024-02-07 ENCOUNTER — Emergency Department (HOSPITAL_COMMUNITY)

## 2024-02-07 ENCOUNTER — Other Ambulatory Visit: Payer: Self-pay

## 2024-02-07 ENCOUNTER — Emergency Department (HOSPITAL_COMMUNITY)
Admission: EM | Admit: 2024-02-07 | Discharge: 2024-02-07 | Disposition: A | Discharge status: Home / Self Care | Source: Ambulatory Visit | Attending: Emergency Medicine | Admitting: Emergency Medicine

## 2024-02-07 ENCOUNTER — Ambulatory Visit (HOSPITAL_COMMUNITY): Admission: EM | Admit: 2024-02-07 | Discharge: 2024-02-07 | Disposition: A | Discharge status: Home / Self Care

## 2024-02-07 DIAGNOSIS — Z7901 Long term (current) use of anticoagulants: Secondary | ICD-10-CM | POA: Insufficient documentation

## 2024-02-07 DIAGNOSIS — R0789 Other chest pain: Secondary | ICD-10-CM | POA: Insufficient documentation

## 2024-02-07 DIAGNOSIS — R079 Chest pain, unspecified: Secondary | ICD-10-CM

## 2024-02-07 DIAGNOSIS — Z7984 Long term (current) use of oral hypoglycemic drugs: Secondary | ICD-10-CM | POA: Diagnosis not present

## 2024-02-07 DIAGNOSIS — Z794 Long term (current) use of insulin: Secondary | ICD-10-CM | POA: Insufficient documentation

## 2024-02-07 DIAGNOSIS — N189 Chronic kidney disease, unspecified: Secondary | ICD-10-CM | POA: Insufficient documentation

## 2024-02-07 DIAGNOSIS — R7989 Other specified abnormal findings of blood chemistry: Secondary | ICD-10-CM | POA: Diagnosis not present

## 2024-02-07 DIAGNOSIS — E1122 Type 2 diabetes mellitus with diabetic chronic kidney disease: Secondary | ICD-10-CM | POA: Diagnosis not present

## 2024-02-07 LAB — HEPATIC FUNCTION PANEL
ALT: 20 U/L (ref 0–44)
AST: 24 U/L (ref 15–41)
Albumin: 3.8 g/dL (ref 3.5–5.0)
Alkaline Phosphatase: 47 U/L (ref 38–126)
Bilirubin, Direct: 0.3 mg/dL — ABNORMAL HIGH (ref 0.0–0.2)
Indirect Bilirubin: 1.6 mg/dL — ABNORMAL HIGH (ref 0.3–0.9)
Total Bilirubin: 1.9 mg/dL — ABNORMAL HIGH (ref 0.0–1.2)
Total Protein: 7.8 g/dL (ref 6.5–8.1)

## 2024-02-07 LAB — CBC
HCT: 51.4 % (ref 39.0–52.0)
Hemoglobin: 16.2 g/dL (ref 13.0–17.0)
MCH: 30.2 pg (ref 26.0–34.0)
MCHC: 31.5 g/dL (ref 30.0–36.0)
MCV: 95.7 fL (ref 80.0–100.0)
Platelets: 175 K/uL (ref 150–400)
RBC: 5.37 MIL/uL (ref 4.22–5.81)
RDW: 13.4 % (ref 11.5–15.5)
WBC: 7 K/uL (ref 4.0–10.5)
nRBC: 0 % (ref 0.0–0.2)

## 2024-02-07 LAB — BASIC METABOLIC PANEL WITH GFR
Anion gap: 9 (ref 5–15)
BUN: 20 mg/dL (ref 8–23)
CO2: 22 mmol/L (ref 22–32)
Calcium: 9.4 mg/dL (ref 8.9–10.3)
Chloride: 106 mmol/L (ref 98–111)
Creatinine, Ser: 1.55 mg/dL — ABNORMAL HIGH (ref 0.61–1.24)
GFR, Estimated: 49 mL/min — ABNORMAL LOW (ref >60)
Glucose, Bld: 199 mg/dL — ABNORMAL HIGH (ref 70–99)
Potassium: 4.7 mmol/L (ref 3.5–5.1)
Sodium: 137 mmol/L (ref 135–145)

## 2024-02-07 LAB — TROPONIN I (HIGH SENSITIVITY)
Troponin I (High Sensitivity): 21 ng/L — ABNORMAL HIGH (ref <18)
Troponin I (High Sensitivity): 22 ng/L — ABNORMAL HIGH (ref <18)

## 2024-02-07 LAB — LIPASE, BLOOD: Lipase: 62 U/L — ABNORMAL HIGH (ref 11–51)

## 2024-02-07 LAB — D-DIMER, QUANTITATIVE: D-Dimer, Quant: <0.27 ug{FEU}/mL (ref 0.00–0.50)

## 2024-02-07 NOTE — ED Triage Notes (Signed)
 Patient reports sore, dull pain across diaphragm area since December after getting gallbladder removed, steadily getting worse. Patient reports significant tenderness and movement makes it worse. Patient denies fever, chills, N/V/D.

## 2024-02-07 NOTE — ED Provider Notes (Signed)
 [facility] Provider Note   CSN: [id number] Arrival date & time: 02/07/24  1123     Patient presents with: Chest Pain  HPI Leroy Chen is a 68 y.o. male with history of A-fib on Xarelto , CKD, diabetes, hypercholesterolemia, status post cholecystectomy in December presented for chest pain.  He states has been going on for about 2 weeks.  He describes it as a dull pain that runs across his diaphragm area in the lower chest.  Denies associated shortness of breath, nausea vomiting diarrhea.  Denies any pain radiating into the abdomen.  He is here today because he noticed the pain is worse in the last couple days.  Not worse with deep breathing or exertion.    Chest Pain      Prior to Admission medications   Medication Sig Start Date End Date Taking? Authorizing Provider  Adalimumab 40 MG/0.4ML PNKT Inject 40 mg into the skin once a week. Mondays    [provider]  albuterol  (PROVENTIL  HFA;VENTOLIN  HFA) 108 (90 Base) MCG/ACT inhaler Inhale 1-2 puffs into the lungs every 6 (six) hours as needed for wheezing or shortness of breath. 08/05/17   Christopher Savannah, PA-C  Chlorhexidine  Gluconate 4 % SOLN Apply 1 application topically See admin instructions. Apply to armpits & Groin 3 times a week in the shower. Leave on for 1 minute and then rinse    [provider]  clindamycin  (CLEOCIN  T) 1 % lotion Apply 1 application topically 2 (two) times daily. Draining bumps 03/14/19   [provider]  clobetasol (TEMOVATE) 0.05 % external solution Apply 1 application topically 2 (two) times daily as needed (infection on scalp).    [provider]  dicyclomine  (BENTYL ) 10 MG capsule Take 10 mg by mouth 3 (three) times daily as needed for spasms.    [provider]  doxycycline  (VIBRA -TABS) 100 MG tablet Take 100 mg by mouth. 01/14/24   [provider]  empagliflozin (JARDIANCE) 25 MG TABS tablet Take 12.5 mg by  mouth. 10/10/23   [provider]  ergocalciferol (VITAMIN D2) 1.25 MG (50000 UT) capsule Take 50,000 Units by mouth once a week. Mondays    [provider]  flecainide  (TAMBOCOR ) 50 MG tablet Take 50 mg by mouth 2 (two) times daily.    [provider]  Fluocinolone Acetonide 0.01 % OIL Place 1 application in ear(s) 2 (two) times daily as needed (scaling).    [provider]  insulin  glargine (LANTUS ) 100 UNIT/ML Solostar Pen Inject 58 Units into the skin every evening. 04/03/19   [provider]  losartan  (COZAAR ) 100 MG tablet Take 50 mg by mouth daily. 10/28/22   [provider]  melatonin 3 MG TABS tablet Take 3 mg by mouth at bedtime as needed.    [provider]  metFORMIN  (GLUCOPHAGE -XR) 500 MG 24 hr tablet Take 500 mg by mouth 2 (two) times daily with a meal. 01/01/23   [provider]  metoprolol  tartrate (LOPRESSOR ) 50 MG tablet Take by mouth. 01/08/23   [provider]  Multiple Vitamin (MULTIVITAMIN) tablet Take 1 tablet by mouth daily.    [provider]  omeprazole (PRILOSEC) 20 MG capsule Take 20 mg by mouth 2 (two) times daily as needed (heartburn).    [provider]  oxyCODONE  (OXY IR/ROXICODONE ) 5 MG immediate release tablet Take 1 tablet (5 mg total) by mouth every 8 (eight) hours as needed for moderate pain (pain score 4-6).  07/25/23   Valdemar Rocky SAUNDERS, NP  pantoprazole  (PROTONIX ) 40 MG tablet Take 40 mg by mouth daily. 01/08/23   [provider]  rivaroxaban  (XARELTO ) 20 MG TABS tablet Take 20 mg by mouth daily.    [provider]  rosuvastatin  (CRESTOR ) 40 MG tablet Take 40 mg by mouth at bedtime.    [provider]  Semaglutide (OZEMPIC, 1 MG/DOSE, [place]) Inject 1 mg into the skin once a week. Mondays    [provider]  Semaglutide, 2 MG/DOSE, 8 MG/3ML SOPN Inject 2 mg into the skin once a week. Inject on Friday 01/17/23   [provider]   sertraline  (ZOLOFT ) 100 MG tablet Take 100 mg by mouth daily.    [provider]  Testosterone 1.62 % GEL Place 3 Pump onto the skin daily. Apply to upper arm or shoulder areas only    [provider]    Allergies: Penicillins    Review of Systems  Cardiovascular:  Positive for chest pain.    Updated Vital Signs BP (!) 136/91   Pulse 83   Temp 97.9 F (36.6 C)   Resp (!) 27   Ht 6' (1.829 m)   Wt 110.7 kg   SpO2 100%   BMI 33.09 kg/m   Physical Exam Vitals and nursing note reviewed.  HENT:     Head: Normocephalic and atraumatic.     Mouth/Throat:     Mouth: Mucous membranes are moist.  Eyes:     General:        Right eye: No discharge.        Left eye: No discharge.     Conjunctiva/sclera: Conjunctivae normal.  Cardiovascular:     Rate and Rhythm: Normal rate and regular rhythm.     Pulses: Normal pulses.     Heart sounds: Normal heart sounds.  Pulmonary:     Effort: Pulmonary effort is normal.     Breath sounds: Normal breath sounds.  Chest:     Comments: Reproducible chest wall tenderness with palpation all about the lower chest.  No erythema edema step-off or ecchymosis noted. Abdominal:     General: Abdomen is flat. There is no distension.     Palpations: Abdomen is soft.     Tenderness: There is no abdominal tenderness.  Skin:    General: Skin is warm and dry.  Neurological:     General: No focal deficit present.  Psychiatric:        Mood and Affect: Mood normal.     (all labs ordered are listed, but only abnormal results are displayed) Labs Reviewed  BASIC METABOLIC PANEL WITH GFR - Abnormal; Notable for the following components:      Result Value   Glucose, Bld 199 (*)    Creatinine, Ser 1.55 (*)    GFR, Estimated 49 (*)    All other components within normal limits  HEPATIC FUNCTION PANEL - Abnormal; Notable for the following components:   Total Bilirubin 1.9 (*)    Bilirubin, Direct 0.3 (*)    Indirect Bilirubin 1.6 (*)     All other components within normal limits  LIPASE, BLOOD - Abnormal; Notable for the following components:   Lipase 62 (*)    All other components within normal limits  TROPONIN I (HIGH SENSITIVITY) - Abnormal; Notable for the following components:   Troponin I (High Sensitivity) 21 (*)    All other components within normal limits  TROPONIN I (HIGH SENSITIVITY) - Abnormal; Notable for the  following components:   Troponin I (High Sensitivity) 22 (*)    All other components within normal limits  CBC  D-DIMER, QUANTITATIVE    EKG: EKG Interpretation Date/Time:  Thursday February 07 2024 13:03:51 EDT Ventricular Rate:  89 PR Interval:  232 QRS Duration:  160 QT Interval:  392 QTC Calculation: 477 R Axis:   267  Text Interpretation: Sinus rhythm Prolonged PR interval RBBB and LAFB when compared to prior, now appears sinus from flutter previously and RBBB No STEMI Confirmed by Ginger Barefoot [phone]) on 02/07/2024 1:06:40 PM  Radiology: [facility] Chest 2 View Result Date: 02/07/2024 CLINICAL DATA:  Chest pain. EXAM: CHEST - 2 VIEW COMPARISON:  X-ray 07/02/2023 FINDINGS: No consolidation, pneumothorax or effusion. No edema. Normal cardiopericardial silhouette. IMPRESSION: No acute cardiopulmonary disease. Electronically Signed   By: Ranell Bring M.D.   On: 02/07/2024 12:25     Procedures   Medications Ordered in the ED - No data to display                                  Medical Decision Making Amount and/or Complexity of Data Reviewed Labs: ordered. Radiology: ordered.   Initial Impression and Ddx 68 year old well-appearing male presenting for chest pain.  Exam notable for reproducible lower chest wall tenderness with palpation.  DDx includes ACS, PE, diaphragmatic injury, aortic dissection, pneumothorax, intra-abdominal infection, acute pancreatitis, other. Patient PMH that increases complexity of ED encounter:  history of A-fib on Xarelto , CKD, diabetes, hypercholesterolemia, status  post cholecystectomy in December   Interpretation of Diagnostics - I independent reviewed and interpreted the labs as followed: Elevated troponin but no significant delta, negative D-dimer, hyperglycemia, slightly elevated lipase  - I independently visualized the following imaging with scope of interpretation limited to determining acute life threatening conditions related to emergency care: cxr, which revealed no acute cardiopulmonary disease  - I personally reviewed and interpreted EKG which revealed sinus rhythm with RBBB  Patient Reassessment and Ultimate Disposition/Management On reassessment symptoms unchanged.  Workup here is unremarkable.  Advised him to follow-up with his cardiologist and PCP at the [place].  Discussed return precautions.  Discharged in good condition.  Patient management required discussion with the following services or consulting groups:  None  Complexity of Problems Addressed Acute complicated illness or Injury  Additional Data Reviewed and Analyzed Further history obtained from: Past medical history and medications listed in the EMR and Prior ED visit notes  Patient Encounter Risk Assessment Consideration of hospitalization      Final diagnoses:  Chest wall pain    ED Discharge Orders     None          Lang Norleen POUR, PA-C 02/07/24 1547    Tegeler, Lonni PARAS, MD 02/07/24 1558

## 2024-02-07 NOTE — ED Notes (Addendum)
 Patient is being discharged from the Urgent Care and sent to the Emergency Department via POV . Per Rumaldo Ryder, NP, patient is in need of higher level of care due to chest pain, hx of afib. Patient is aware and verbalizes understanding of plan of care.  Vitals:   02/07/24 1053  BP: 133/82  Pulse: 94  Resp: 18  Temp: 97.8 F (36.6 C)  SpO2: 95%

## 2024-02-07 NOTE — ED Triage Notes (Addendum)
 Pt arrives POV from UC. Pt reports CP x 1 week. Denies any SHOB or nausea.

## 2024-02-07 NOTE — Discharge Instructions (Addendum)
Please go to the emergency department for further evaluation of your chest pain

## 2024-02-07 NOTE — ED Provider Notes (Signed)
 [facility]    CSN: [id number] Arrival date & time: 02/07/24  1002      History   Chief Complaint Chief Complaint  Patient presents with   Chest Pain    HPI Leroy Chen is a 68 y.o. male.   Patient presents with bilateral lower chest soreness since his surgery in December 2020 for where he had his gallbladder removed.  Patient states over the last 2 weeks the pain has become worse and he now has some pain that he describes as soreness to his left upper chest.  Denies shortness of breath, dizziness, weakness, numbness, headache, confusion, slurred speech, and facial droop.  Patient does have a history of A-fib and reports taking his medications daily related to this.  Other past medical history includes CKD, diabetes, and hyperlipidemia.  The history is provided by the patient and medical records.  Chest Pain   Past Medical History:  Diagnosis Date   Atrial fibrillation [facility]) March 2015   Diabetes mellitus without complication (HCC)    NIDDM, type II   High cholesterol    Hydradenitis    Sebaceous cyst    hx of   Sigmoid diverticulitis    Wears glasses     Patient Active Problem List   Diagnosis Date Noted   Calculus of gallbladder with acute cholecystitis and obstruction 07/13/2023   Elevated LFTs 07/12/2023   Abnormal finding on GI tract imaging 07/11/2023   Dilated bile duct 07/11/2023   Elevated liver enzymes 07/11/2023   Anticoagulated on heparin  07/11/2023   Quadriceps tendon rupture, right, initial encounter 07/04/2023   Cellulitis of right lower extremity 07/03/2023   Acute kidney injury superimposed on chronic kidney disease (HCC) 07/03/2023   Sinus tachycardia 07/03/2023   Normocytic anemia 07/03/2023   Metabolic acidosis 07/03/2023   Elevated bilirubin 07/03/2023   Heart block 04/21/2021   Ulcerative colitis (HCC) 04/21/2021   Coagulopathy (HCC) 04/21/2021   Hyponatremia 11/12/2020   CKD (chronic kidney disease), stage III (HCC)  11/12/2020   Diverticulitis large intestine 04/27/2015   Unspecified atrial fibrillation (HCC) 10/17/2014   Smoker 10/17/2014   Demand ischemia from AF with RVR-Troponin 0.07 10/17/2014   Diverticulitis of large intestine with abscess without bleeding 10/15/2014   Diverticulitis of intestine with abscess without bleeding 10/12/2014   Colonic diverticular abscess- s/p colectomy 10/15/14 10/12/2014   Folliculitis 10/12/2014   Severe sepsis (HCC) 10/12/2014   Insulin -requiring or dependent type II diabetes mellitus (HCC) 10/12/2014   Hidradenitis suppurativa 08/02/2012    Past Surgical History:  Procedure Laterality Date   CHOLECYSTECTOMY N/A 07/14/2023   Procedure: LAPAROSCOPIC CHOLECYSTECTOMY;  Surgeon: Paola Dreama SAILOR, MD;  Location: [facility];  Service: General;  Laterality: N/A;   COLECTOMY  10/15/14   ERCP N/A 07/12/2023   Procedure: ENDOSCOPIC RETROGRADE CHOLANGIOPANCREATOGRAPHY (ERCP);  Surgeon: Aneita Gwendlyn DASEN, MD;  Location: [facility] ENDOSCOPY;  Service: Gastroenterology;  Laterality: N/A;   hand sugery     ILEO LOOP COLOSTOMY CLOSURE N/A 04/27/2015   Procedure: DIAGNOSTIC LAPAROSCOPY,  EXPLORATORY LAPAROTOMY WITH RESECTION AND CLOSURE OF COLOSTOMY ;  Surgeon: Elon Pacini, MD;  Location: [facility];  Service: General;  Laterality: N/A;   LAPAROTOMY N/A 10/15/2014   Procedure: EXPLORATORY LAPAROTOMY WITH DRAINAGE OF INTRA- ABDOMINAL ABSCESS;  Surgeon: Elon Pacini, MD;  Location: [facility];  Service: General;  Laterality: N/A;   ORCHIECTOMY     PARTIAL COLECTOMY N/A 10/15/2014   Procedure: SIGMOID COLECTOMY WITH COLOSTOMY CREATION;  Surgeon: Elon Pacini, MD;  Location: [facility];  Service: General;  Laterality: N/A;   PILONIDAL CYST EXCISION N/A 01/01/2015   Procedure: EXCISION OF PILONIDAL DISEASE;  Surgeon: Bernarda Ned, MD;  Location[facility];  Service: General;  Laterality: N/A;   QUADRICEPS TENDON REPAIR Right 07/06/2023   Procedure: REPAIR RIGHT QUADRICEP TENDON;   Surgeon: Harden Jerona GAILS, MD;  Location: [facility] OR;  Service: Orthopedics;  Laterality: Right;   REMOVAL OF STONES  07/12/2023   Procedure: REMOVAL OF STONES;  Surgeon: Aneita Gwendlyn DASEN, MD;  Location: [facility] ENDOSCOPY;  Service: Gastroenterology;;   ANNETT  07/12/2023   Procedure: ANNETT;  Surgeon: Aneita Gwendlyn DASEN, MD;  Location: [facility] ENDOSCOPY;  Service: Gastroenterology;;       Home Medications    Prior to Admission medications   Medication Sig Start Date End Date Taking? Authorizing Provider  doxycycline  (VIBRA -TABS) 100 MG tablet Take 100 mg by mouth. 01/14/24  Yes [provider]  empagliflozin (JARDIANCE) 25 MG TABS tablet Take 12.5 mg by mouth. 10/10/23  Yes [provider]  flecainide  (TAMBOCOR ) 50 MG tablet Take 50 mg by mouth 2 (two) times daily.   Yes [provider]  insulin  glargine (LANTUS ) 100 UNIT/ML Solostar Pen Inject 58 Units into the skin every evening. 04/03/19  Yes [provider]  losartan  (COZAAR ) 100 MG tablet Take 50 mg by mouth daily. 10/28/22  Yes [provider]  metFORMIN  (GLUCOPHAGE -XR) 500 MG 24 hr tablet Take 500 mg by mouth 2 (two) times daily with a meal. 01/01/23  Yes [provider]  metoprolol  tartrate (LOPRESSOR ) 50 MG tablet Take by mouth. 01/08/23  Yes [provider]  Multiple Vitamin (MULTIVITAMIN) tablet Take 1 tablet by mouth daily.   Yes [provider]  pantoprazole  (PROTONIX ) 40 MG tablet Take 40 mg by mouth daily. 01/08/23  Yes [provider]  rivaroxaban  (XARELTO ) 20 MG TABS tablet Take 20 mg by mouth daily.   Yes [provider]  rosuvastatin  (CRESTOR ) 40 MG tablet Take 40 mg by mouth at bedtime.   Yes [provider]  Semaglutide (OZEMPIC, 1 MG/DOSE, [place]) Inject 1 mg into the skin once a week. Mondays   Yes [provider]  Semaglutide, 2 MG/DOSE, 8 MG/3ML SOPN Inject 2 mg into the skin once a week. Inject on Friday 01/17/23  Yes  [provider]  sertraline  (ZOLOFT ) 100 MG tablet Take 100 mg by mouth daily.   Yes [provider]  Testosterone 1.62 % GEL Place 3 Pump onto the skin daily. Apply to upper arm or shoulder areas only   Yes [provider]  Adalimumab 40 MG/0.4ML PNKT Inject 40 mg into the skin once a week. Mondays    [provider]  albuterol  (PROVENTIL  HFA;VENTOLIN  HFA) 108 (90 Base) MCG/ACT inhaler Inhale 1-2 puffs into the lungs every 6 (six) hours as needed for wheezing or shortness of breath. 08/05/17   Christopher Savannah, PA-C  Chlorhexidine  Gluconate 4 % SOLN Apply 1 application topically See admin instructions. Apply to armpits & Groin 3 times a week in the shower. Leave on for 1 minute and then rinse    [provider]  clindamycin  (CLEOCIN  T) 1 % lotion Apply 1 application topically 2 (two) times daily. Draining bumps 03/14/19   [provider]  clobetasol (TEMOVATE) 0.05 % external solution Apply 1 application topically 2 (two) times daily as needed (infection on scalp).    [provider]  dicyclomine  (BENTYL ) 10 MG capsule Take 10 mg by mouth 3 (three)  times daily as needed for spasms.    [provider]  ergocalciferol (VITAMIN D2) 1.25 MG (50000 UT) capsule Take 50,000 Units by mouth once a week. Mondays    [provider]  Fluocinolone Acetonide 0.01 % OIL Place 1 application in ear(s) 2 (two) times daily as needed (scaling).    [provider]  melatonin 3 MG TABS tablet Take 3 mg by mouth at bedtime as needed.    [provider]  omeprazole (PRILOSEC) 20 MG capsule Take 20 mg by mouth 2 (two) times daily as needed (heartburn).    [provider]  oxyCODONE  (OXY IR/ROXICODONE ) 5 MG immediate release tablet Take 1 tablet (5 mg total) by mouth every 8 (eight) hours as needed for moderate pain (pain score 4-6). 07/25/23   Valdemar Rocky SAUNDERS, NP    Family History Family History  Problem Relation Age of Onset    Healthy Mother     Social History Social History   Tobacco Use   Smoking status: Former    Current packs/day: 0.00    Average packs/day: 0.3 packs/day for 20.0 years (5.0 ttl pk-yrs)    Types: Cigarettes    Start date: 14    Quit date: 2018    Years since quitting: 7.5   Smokeless tobacco: Never  Vaping Use   Vaping status: Never Used  Substance Use Topics   Alcohol use: Not Currently   Drug use: No     Allergies   Penicillins   Review of Systems Review of Systems  Cardiovascular:  Positive for chest pain.   Per HPI  Physical Exam Triage Vital Signs ED Triage Vitals  Encounter Vitals Group     BP 02/07/24 1053 133/82     Girls Systolic BP Percentile --      Girls Diastolic BP Percentile --      Boys Systolic BP Percentile --      Boys Diastolic BP Percentile --      Pulse Rate 02/07/24 1053 94     Resp 02/07/24 1053 18     Temp 02/07/24 1053 97.8 F (36.6 C)     Temp Source 02/07/24 1053 Oral     SpO2 02/07/24 1053 95 %     Weight --      Height --      Head Circumference --      Peak Flow --      Pain Score 02/07/24 1051 7     Pain Loc --      Pain Education --      Exclude from Growth Chart --    No data found.  Updated Vital Signs BP 133/82 (BP Location: Left Arm)   Pulse 94   Temp 97.8 F (36.6 C) (Oral)   Resp 18   SpO2 95%   Visual Acuity Right Eye Distance:   Left Eye Distance:   Bilateral Distance:    Right Eye Near:   Left Eye Near:    Bilateral Near:     Physical Exam Vitals and nursing note reviewed.  Constitutional:      General: He is awake. He is not in acute distress.    Appearance: Normal appearance. He is well-developed and well-groomed. He is not ill-appearing.  Cardiovascular:     Rate and Rhythm: Normal rate and regular rhythm.  Pulmonary:     Effort: Pulmonary effort is normal.     Breath sounds: Normal breath sounds.  Chest:     Chest wall: Tenderness  present.       Comments: Tenderness present to  left upper chest wall as well as generalized lower chest wall Skin:    General: Skin is warm and dry.  Neurological:     General: No focal deficit present.     Mental Status: He is alert and oriented to person, place, and time. Mental status is at baseline.  Psychiatric:        Behavior: Behavior is cooperative.      UC Treatments / Results  Labs (all labs ordered are listed, but only abnormal results are displayed) Labs Reviewed - No data to display  EKG   Radiology No results found.  Procedures Procedures (including critical care time)  Medications Ordered in UC Medications - No data to display  Initial Impression / Assessment and Plan / UC Course  I have reviewed the triage vital signs and the nursing notes.  Pertinent labs & imaging results that were available during my care of the patient were reviewed by me and considered in my medical decision making (see chart for details).     Patient is overall well-appearing.  Vitals are stable.  Tenderness palpation to chest wall.  Heart and lung sounds normal.  EKG revealed sinus rhythm with a first-degree AV block which is different from previous EKGs.  Recommended patient be seen in the emergency department for further evaluation of chest pain due to cardiac history as well as new presence of first-degree AV block.  Patient is agreeable to plan at this time.  Patient is stable to arrived to the ER via POV. Final Clinical Impressions(s) / UC Diagnoses   Final diagnoses:  Chest pain, unspecified type     Discharge Instructions      Please go to the emergency department for further evaluation of your chest pain.   ED Prescriptions   None    PDMP not reviewed this encounter.   Johnie Flaming A, NP 02/07/24 1120

## 2024-02-07 NOTE — ED Triage Notes (Addendum)
 Pt states he has had chest soreness since his surgery in 06/2023 when he had his gallbladder removed. States it has got worse in last 2 weeks. No other sx  He is currently taking doxy for a cyst infection.

## 2024-02-07 NOTE — Discharge Instructions (Signed)
 Evaluate today was overall reassuring. Please follow-up with cardiology as your cardiac enzymes were slightly elevated.  If your symptoms worsen anyway please return to the ED for further evaluation.

## 2024-02-07 NOTE — ED Notes (Signed)
 Dc instructions reviewed with pt no questions at this time. Ambulated without difficulty out of ed. Will follow up.

## 2024-05-19 ENCOUNTER — Encounter: Payer: Self-pay | Admitting: Radiology
